# Patient Record
Sex: Male | Born: 1935 | Race: White | Hispanic: No | Marital: Married | Smoking: Former smoker
Health system: Southern US, Community
[De-identification: ages and names within clinical notes are randomized; demographics above are authoritative.]

## PROBLEM LIST (undated history)

## (undated) ENCOUNTER — Emergency Department (HOSPITAL_COMMUNITY): Payer: Medicare Other | Source: Home / Self Care

## (undated) DIAGNOSIS — H532 Diplopia: Secondary | ICD-10-CM

## (undated) DIAGNOSIS — J8410 Pulmonary fibrosis, unspecified: Secondary | ICD-10-CM

## (undated) DIAGNOSIS — I639 Cerebral infarction, unspecified: Secondary | ICD-10-CM

## (undated) DIAGNOSIS — C61 Malignant neoplasm of prostate: Secondary | ICD-10-CM

## (undated) DIAGNOSIS — E785 Hyperlipidemia, unspecified: Secondary | ICD-10-CM

## (undated) DIAGNOSIS — R4189 Other symptoms and signs involving cognitive functions and awareness: Secondary | ICD-10-CM

## (undated) DIAGNOSIS — I6529 Occlusion and stenosis of unspecified carotid artery: Secondary | ICD-10-CM

## (undated) DIAGNOSIS — I714 Abdominal aortic aneurysm, without rupture, unspecified: Secondary | ICD-10-CM

## (undated) DIAGNOSIS — T7840XA Allergy, unspecified, initial encounter: Secondary | ICD-10-CM

## (undated) DIAGNOSIS — M4310 Spondylolisthesis, site unspecified: Secondary | ICD-10-CM

## (undated) DIAGNOSIS — K219 Gastro-esophageal reflux disease without esophagitis: Secondary | ICD-10-CM

## (undated) DIAGNOSIS — G459 Transient cerebral ischemic attack, unspecified: Secondary | ICD-10-CM

## (undated) DIAGNOSIS — R413 Other amnesia: Secondary | ICD-10-CM

## (undated) HISTORY — DX: Malignant neoplasm of prostate: C61

## (undated) HISTORY — DX: Occlusion and stenosis of unspecified carotid artery: I65.29

## (undated) HISTORY — DX: Hyperlipidemia, unspecified: E78.5

## (undated) HISTORY — DX: Other amnesia: R41.3

## (undated) HISTORY — DX: Abdominal aortic aneurysm, without rupture, unspecified: I71.40

## (undated) HISTORY — DX: Allergy, unspecified, initial encounter: T78.40XA

## (undated) HISTORY — DX: Transient cerebral ischemic attack, unspecified: G45.9

## (undated) HISTORY — DX: Abdominal aortic aneurysm, without rupture: I71.4

## (undated) HISTORY — PX: FOOT SURGERY: SHX648

## (undated) HISTORY — PX: EYE SURGERY: SHX253

## (undated) HISTORY — DX: Diplopia: H53.2

## (undated) HISTORY — DX: Spondylolisthesis, site unspecified: M43.10

---

## 1998-04-02 ENCOUNTER — Ambulatory Visit (HOSPITAL_COMMUNITY): Admission: RE | Admit: 1998-04-02 | Discharge: 1998-04-02 | Payer: Self-pay | Admitting: Gastroenterology

## 1999-03-23 HISTORY — PX: OTHER SURGICAL HISTORY: SHX169

## 1999-04-27 ENCOUNTER — Other Ambulatory Visit: Admission: RE | Admit: 1999-04-27 | Discharge: 1999-04-27 | Payer: Self-pay | Admitting: Urology

## 1999-05-26 ENCOUNTER — Encounter: Admission: RE | Admit: 1999-05-26 | Discharge: 1999-08-24 | Payer: Self-pay | Admitting: Radiation Oncology

## 1999-08-07 ENCOUNTER — Ambulatory Visit (HOSPITAL_BASED_OUTPATIENT_CLINIC_OR_DEPARTMENT_OTHER): Admission: RE | Admit: 1999-08-07 | Discharge: 1999-08-07 | Payer: Self-pay | Admitting: *Deleted

## 1999-08-07 ENCOUNTER — Encounter: Payer: Self-pay | Admitting: Urology

## 1999-08-28 ENCOUNTER — Ambulatory Visit (HOSPITAL_COMMUNITY): Admission: RE | Admit: 1999-08-28 | Discharge: 1999-08-28 | Payer: Self-pay | Admitting: Radiation Oncology

## 1999-09-15 ENCOUNTER — Encounter: Admission: RE | Admit: 1999-09-15 | Discharge: 1999-12-14 | Payer: Self-pay | Admitting: Radiation Oncology

## 2003-03-12 ENCOUNTER — Observation Stay (HOSPITAL_COMMUNITY): Admission: EM | Admit: 2003-03-12 | Discharge: 2003-03-13 | Payer: Self-pay | Admitting: Emergency Medicine

## 2004-05-04 ENCOUNTER — Ambulatory Visit (HOSPITAL_COMMUNITY): Admission: RE | Admit: 2004-05-04 | Discharge: 2004-05-04 | Payer: Self-pay | Admitting: Gastroenterology

## 2006-11-21 HISTORY — PX: CAROTID ENDARTERECTOMY: SUR193

## 2006-12-06 ENCOUNTER — Ambulatory Visit: Payer: Self-pay | Admitting: Vascular Surgery

## 2006-12-09 ENCOUNTER — Encounter: Payer: Self-pay | Admitting: Vascular Surgery

## 2006-12-09 ENCOUNTER — Ambulatory Visit: Payer: Self-pay | Admitting: Vascular Surgery

## 2006-12-09 ENCOUNTER — Inpatient Hospital Stay (HOSPITAL_COMMUNITY): Admission: RE | Admit: 2006-12-09 | Discharge: 2006-12-10 | Payer: Self-pay | Admitting: Vascular Surgery

## 2007-01-03 ENCOUNTER — Ambulatory Visit: Payer: Self-pay | Admitting: Vascular Surgery

## 2007-06-28 ENCOUNTER — Encounter: Admission: RE | Admit: 2007-06-28 | Discharge: 2007-06-28 | Payer: Self-pay | Admitting: Family Medicine

## 2007-07-04 ENCOUNTER — Ambulatory Visit: Payer: Self-pay | Admitting: Vascular Surgery

## 2008-01-02 ENCOUNTER — Ambulatory Visit: Payer: Self-pay | Admitting: Vascular Surgery

## 2008-01-02 ENCOUNTER — Encounter: Admission: RE | Admit: 2008-01-02 | Discharge: 2008-01-02 | Payer: Self-pay | Admitting: Vascular Surgery

## 2008-08-06 ENCOUNTER — Ambulatory Visit: Payer: Self-pay | Admitting: Vascular Surgery

## 2008-08-15 ENCOUNTER — Encounter: Admission: RE | Admit: 2008-08-15 | Discharge: 2008-08-15 | Payer: Self-pay | Admitting: Family Medicine

## 2009-02-04 ENCOUNTER — Ambulatory Visit: Payer: Self-pay | Admitting: Vascular Surgery

## 2010-04-16 ENCOUNTER — Ambulatory Visit: Admit: 2010-04-16 | Payer: Self-pay | Admitting: Vascular Surgery

## 2010-04-16 ENCOUNTER — Ambulatory Visit
Admission: RE | Admit: 2010-04-16 | Discharge: 2010-04-16 | Payer: Self-pay | Source: Home / Self Care | Attending: Vascular Surgery | Admitting: Vascular Surgery

## 2010-04-27 NOTE — Procedures (Unsigned)
CAROTID DUPLEX EXAM  INDICATION:  Follow up right carotid stenosis, left CEA.  HISTORY: Diabetes:  No. Cardiac:  No. Hypertension:  No. Smoking:  Previous. Previous Surgery:  Left CEA in 11/2006 CV History: Amaurosis Fugax No, Paresthesias No, Hemiparesis No.                                      RIGHT             LEFT Brachial systolic pressure:         118               118 Brachial Doppler waveforms:         WNL               WNL Vertebral direction of flow:        Antegrade         Antegrade DUPLEX VELOCITIES (cm/sec) CCA peak systolic                   92                112 ECA peak systolic                   104               85 ICA peak systolic                   225               63 ICA end diastolic                   77                17 PLAQUE MORPHOLOGY:                  Heterogeneous     NA PLAQUE AMOUNT:                      Moderate          NA PLAQUE LOCATION:                    CCA/ICA/ECA       NA  IMPRESSION: 1. 60% to 79% right internal carotid artery stenosis. 2. Widely patent left carotid endarterectomy. 3. Antegrade bilateral vertebral arteries. 4. Evidence of disease progression in right internal carotid artery     from prior exam.       ___________________________________________ Quita Skye. Hart Rochester, M.D.  LT/MEDQ  D:  04/16/2010  T:  04/16/2010  Job:  [id number]

## 2010-04-27 NOTE — Procedures (Unsigned)
DUPLEX ULTRASOUND OF ABDOMINAL AORTA  INDICATION:  Followup abdominal aortic aneurysm.  HISTORY: Diabetes:  No Cardiac:  No Hypertension:  No Smoking:  Previous Connective Tissue Disorder: Family History:  No Previous Surgery:  No  DUPLEX EXAM:         AP (cm)                   TRANSVERSE (cm) Proximal             2.75 cm                   2.68 cm Mid                  2.21 cm                   2.73 cm Distal               3.44 cm                   3.47 cm Right Iliac          1.18 cm                   1.23 cm Left Iliac           1.29 cm                   1.26 cm  PREVIOUS:  Date: 02/04/2009  AP:  3.43  TRANSVERSE:  3.47  IMPRESSION:  Stable abdominal aortic aneurysm measuring 3.44 cm x 3.47 cm.  ___________________________________________ Quita Skye Hart Rochester, M.D.  LT/MEDQ  D:  04/16/2010  T:  04/16/2010  Job:  [id number]

## 2010-08-04 NOTE — Procedures (Signed)
CAROTID DUPLEX EXAM   INDICATION:  Follow up known carotid artery disease.   HISTORY:  Diabetes:  No  Cardiac:  Yes  Hypertension:  No  Smoking:  Quit in 1966  Previous Surgery:  No  CV History:  No  Amaurosis Fugax No, Paresthesias No, Hemiparesis No                                       RIGHT             LEFT  Brachial systolic pressure:         150               142  Brachial Doppler waveforms:         Biphasic          Biphasic  Vertebral direction of flow:        Antegrade         Antegrade  DUPLEX VELOCITIES (cm/sec)  CCA peak systolic                   98                82  ECA peak systolic                   79                76  ICA peak systolic                   158               336  ICA end diastolic                   53                141  PLAQUE MORPHOLOGY:                  Heterogenous      Heterogenous  PLAQUE AMOUNT:                      Mild              Severe  PLAQUE LOCATION:                    ICA, ECA          ICA, ECA   IMPRESSION:  Stenosis of 80-99% noted in the left internal carotid  artery.  Stenosis of 40-59% noted in the right internal carotid artery.  Antegrade bilateral vertebral arteries.   ___________________________________________  Quita Skye Hart Rochester, M.D.   MG/MEDQ  D:  12/06/2006  T:  12/07/2006  Job:  [id number]

## 2010-08-04 NOTE — Assessment & Plan Note (Signed)
OFFICE VISIT   [name] [name], [name] [name]  DOB:  05-04-1929                                       01/03/2007  ZOXWR#:[id number]   The patient returns having undergone left carotid endarterectomy by me  on September 19 for severe left internal carotid stenosis.  He suffered  what was felt to be a right posterior circulation TIA in [place] around  the first of September and had some clumsiness in his left upper  extremity as well as diplopia.  It was not clear exactly whether the  left internal carotid stenosis had any input in this symptomatology, but  because of the severe nature he underwent a left carotid endarterectomy  without difficulty.  He has done well since then with no neurologic  complications or specific complaints.  He is taking Aggrenox, which he  has been taking since September 1.  He has been swallowing well, has had  no hoarseness, denies any TIAs or amaurosis fugax.   PHYSICAL EXAM:  Blood pressure 127/75, heart rate 72, respirations 16.  Left neck incision has healed nicely.  Carotid pulses are 3+ with no  audible bruits.  Neurologic exam is normal.   He was reassured regarding these findings and will discontinue the  Aggrenox at the end of October and begin taking aspirin on a daily  basis.  He will return in 6 months for follow-up carotid duplex exam  unless he has any symptoms in the interim.   Quita Skye Hart Rochester, M.D.  Electronically Signed   JDL/MEDQ  D:  01/03/2007  T:  01/04/2007  Job:  [id number]   cc:   Donia Guiles, M.D.

## 2010-08-04 NOTE — Procedures (Signed)
DUPLEX ULTRASOUND OF ABDOMINAL AORTA   INDICATION:  Follow up of abdominal aortic aneurysm.   HISTORY:  Diabetes:  No.  Cardiac:  Yes.  Hypertension:  No.  Smoking:  Previous.  Connective Tissue Disorder:  Family History:  No abdominal aortic aneurysm.  Previous Surgery:  No.   DUPLEX EXAM:         AP (cm)                   TRANSVERSE (cm)  Proximal             2.50 cm                   2.43 cm  Mid                  2.23 cm                   2.02 cm  Distal               3.43 cm                   3.47 cm  Right Iliac          1.31 cm                   1.23 cm  Left Iliac           1.25 cm                   1.14 cm   PREVIOUS:  Date: January 02, 2008 (CT)  AP:  3.6  TRANSVERSE:   IMPRESSION:  Stable abdominal aortic aneurysm from previous CT with  largest measurement today of 3.43 cm x 3.47 cm.   ___________________________________________  Quita Skye Hart Rochester, M.D.   AS/MEDQ  D:  02/04/2009  T:  02/04/2009  Job:  [id number]

## 2010-08-04 NOTE — Procedures (Signed)
CAROTID DUPLEX EXAM   INDICATION:  Follow up known carotid artery disease.   HISTORY:  Diabetes:  No.  Cardiac:  Yes.  Hypertension:  No.  Smoking:  No.  Previous Surgery:  Left carotid endarterectomy in 12/09/2006.  CV History:  Amaurosis Fugax No, Paresthesias No, Hemiparesis No                                       RIGHT             LEFT  Brachial systolic pressure:         122               120  Brachial Doppler waveforms:         Biphasic          Biphasic  Vertebral direction of flow:        Antegrade         Antegrade  DUPLEX VELOCITIES (cm/sec)  CCA peak systolic                   192               137  ECA peak systolic                   134               122  ICA peak systolic                   228               84  ICA end diastolic                   86                33  PLAQUE MORPHOLOGY:                  Heterogeneous     None  PLAQUE AMOUNT:                      Moderate to severe                  None  PLAQUE LOCATION:                    CCA, ECA and ICA  None   IMPRESSION:  1. A 60-79% stenosis noted in the right ICA.  2. Normal carotid duplex noted in the left ICA.  Status post      endarterectomy.  Antegrade bilateral vertebral arteries.   ___________________________________________  Quita Skye Hart Rochester, M.D.   MG/MEDQ  D:  01/02/2008  T:  01/02/2008  Job:  [id number]

## 2010-08-04 NOTE — Discharge Summary (Signed)
NAMEHERMANN, [name] [name]              ACCOUNT NO.:  [id number]   MEDICAL RECORD NO.:  [id number]          PATIENT TYPE:  INP   LOCATION:  3305                         FACILITY:  [facility]   PHYSICIAN:  Quita Skye. Hart Rochester, M.D.  DATE OF BIRTH:  02/01/1930   DATE OF ADMISSION:  12/09/2006  DATE OF DISCHARGE:                               DISCHARGE SUMMARY   DISCHARGE DIAGNOSES:  1. Severe left internal carotid stenosis with recent posterior      circulation transient ischemic attack.  2. Hyperlipidemia.  3. History of prostate cancer treated with radiation seeds.   SURGICAL PROCEDURES PERFORMED DURING THIS HOSPITALIZATION:  December 09, 2006, a left carotid endarterectomy with Dacron patch angioplasty by  Dr. Hart Rochester.   HISTORY AND PRESENT ILLNESS:  A 77 year old male patient suffered what  was thought to be a posterior circulation TIA on September 3 while  visiting [place].  He developed some right posterior occipital pain and  some clumsiness in the left upper extremity and double vision, and had  no evidence of stroke on CT or MRI scan but was found to have some  carotid occlusive disease on an MRA and a carotid duplex exam.  He was  reexamined by Dr. Hart Rochester on September 16 and a carotid duplex revealed a  90% left internal carotid stenosis and he was scheduled for left carotid  endarterectomy.   PAST MEDICAL HISTORY:  1. Hyperlipidemia.  2. Posterior circulation TIAs as noted.  3. History of prostate cancer.   Negative for diabetes, hypertension, coronary artery disease, COPD or  stroke.   PREVIOUS SURGERY:  1. Achilles tendon repair.  2. Left eye cataract surgery.  3. Implantation of radium seeds.   LABORATORY DATA ON ADMISSION:  Included a white count of 5900,  hematocrit of 39%.  Normal clotting studies.  Sodium 139, potassium 4.8,  creatinine 0.9, BUN 13.  His chest x-ray revealed no masses with some  mild COPD.  Electrocardiogram was normal.   HOSPITAL COURSE:   Following admission on the morning of September 19,  the patient underwent an uneventful left carotid endarterectomy with  Dacron patch angioplasty by Dr. Hart Rochester, assisted by Shonna Chock, PA.  He awoke promptly in the recovery room with a normal neurologic exam,  blood pressure running 120/80, heart rate of 60.  He had an excellent  urinary output and was stable hemodynamically.  Postoperatively, he  swallowed well and developed no neurologic deficits and did not have  significant pain.  On the first postoperative day, IV fluids were  discontinued, as was his arterial line for monitoring and his vital  signs remained normal.  He ambulated well, had a good appetite, voided  well and was discharged with instructions to return to Dr. Hart Rochester in 2  weeks and will be notified by our office.  He was instructed to clean  the incision with soap and water and not to drive an automobile for 2  weeks, but to gradually resume his normal activities.   CONDITION AT THE TIME OF DISCHARGE:  Improved.   FINAL DIAGNOSES:  As above.  Quita Skye Hart Rochester, M.D.  Electronically Signed     JDL/MEDQ  D:  12/09/2006  T:  12/10/2006  Job:  [id number]

## 2010-08-04 NOTE — H&P (Signed)
HISTORY AND PHYSICAL EXAMINATION   December 06, 2006   Re:  [name] [name], [name] [name]               DOB:  1929/09/07   CHIEF COMPLAINT:  Severe left internal carotid stenosis with recent  posterior circulation TIA.   HISTORY OF PRESENT ILLNESS:  This 77 year old male patient suffered what  was thought to be a posterior circulation TIA on September 3 when  visiting [place].  He initially noted discomfort in his right posterior  occipital area and then noticed weakness and clumsiness in his left  upper extremity.  He also had diplopia.  He was taken to the emergency  department and within 1-2 hours, all of his symptoms had resolved.  He  had a CT scan and an MRI scan, both of which revealed no evidence of any  hemorrhage or infarct.  He had a carotid duplex exam and MRA, both of  which suggested a 70% left internal carotid stenosis and a mild 40-50%  right internal carotid stenosis.  He was treated with Aggrenox and  discharged to be followed by Dr. Arvilla Market in [place].  He was  referred to Dr. Hart Rochester for further evaluation, and a repeat carotid  duplex exam suggested that his left carotid stenosis was in the 90%  range in severity, and this was in agreement with Dr. Candie Chroman review of  the MRA study, which he had available.  It was not necessarily felt that  this was responsible for his recent event.  The patient decided to  proceed with an elective left carotid endarterectomy.   PAST MEDICAL HISTORY:  1. Hyperlipidemia.  2. Recent posterior circulation TIA.  3. History of prostate cancer, treated with radium seeds.  4. Negative for diabetes, hypertension, coronary artery disease, COPD,      or stroke.   PAST SURGICAL HISTORY:  1. Achilles tendon repair.  2. Left eye cataract surgery.  3. Implantation of radium seeds for prostate cancer.   FAMILY HISTORY:  Patient's brother had coronary artery disease, died at  age 22.  Mother and father had stroke, negative for  diabetes.   SOCIAL HISTORY:  He is married.  He has two children.  He is retired.  He has not smoked cigarettes since 1966.  He drinks occasional alcohol.   REVIEW OF SYSTEMS:  Denies any chest pain, dyspnea on exertion, PND,  orthopnea, anorexia, weight loss, hemoptysis, wheezing, hematemesis,  melena, lower extremity claudication or other specific symptoms.   ALLERGIES:  SUDAFED.   MEDICATIONS:  1. Simvastatin 40 mg once daily.  2. Aggrenox 2 times daily.  3. Claritin 10 mg daily.  4. Centrum Silver 1 daily.  5. V-D 1000 IU 1 daily.  6. Fish oil 1000 mg 3 times daily.   PHYSICAL EXAMINATION:  Blood pressure 148/80, heart rate 72,  respirations 16.  GENERAL:  He is a healthy-appearing male in no apparent distress.  Alert  and oriented x3.  NECK:  Supple.  Carotid pulses are 3+ and palpable.  There is a soft  bruit on the left side.  No bruit on the right.  NEUROLOGIC:  Normal.  No palpable adenopathy of the neck.  CHEST:  Clear to auscultation.  CARDIOVASCULAR:  Regular rhythm with no murmurs.  Upper extremity pulses are 3+ bilaterally.  ABDOMEN:  Soft and nontender with no palpable masses.  There are 3+ femoral, popliteal, and posterior tibial pulses  bilaterally.  There is no distal edema.   Carotid duplex exam  in the [facility] office revealed a 90% left internal  carotid stenosis and a proximal 50% right internal carotid stenosis.   IMPRESSION:  1. Recent posterior circulation transient ischemic attack.  2. Severe internal carotid stenosis, possibly asymptomatic.  3. Hyperlipidemia.  4. History of prostate cancer.   PLAN:  Admit patient on September 19th for an elective left carotid  endarterectomy.  Risks and benefits have been thoroughly discussed with  the patient, and he would like to proceed.   Quita Skye Hart Rochester, M.D.  Electronically Signed   JDL/MEDQ  D:  12/06/2006  T:  12/07/2006  Job:  [id number]

## 2010-08-04 NOTE — Procedures (Signed)
CAROTID DUPLEX EXAM   INDICATION:  Followup of known carotid artery disease.  The patient is  asymptomatic.   HISTORY:  Diabetes:  No.  Cardiac:  Yes.  Hypertension:  No.  Smoking:  No.  Previous Surgery:  Left CEA with DPA on 12/09/2006 by Dr. Hart Rochester.  CV History:  Amaurosis Fugax No, Paresthesias No, Hemiparesis No                                       RIGHT             LEFT  Brachial systolic pressure:         144               138  Brachial Doppler waveforms:         WNL               WNL  Vertebral direction of flow:        Antegrade         Antegrade  DUPLEX VELOCITIES (cm/sec)  CCA peak systolic                   104               94  ECA peak systolic                   96                71  ICA peak systolic                   164               62  ICA end diastolic                   59                19  PLAQUE MORPHOLOGY:                  Heterogenous      N/A  PLAQUE AMOUNT:                      Moderate          N/A  PLAQUE LOCATION:                    Bifurcation, ICA  N/A   IMPRESSION:  1. Right 40-59% ICA stenosis (upper end of range) with increased      velocity noted through to the mid ICA.  2. No recurrent stenosis status post left CEA.  3. Patent ECAs.  4. Bilateral antegrade flow in vertebral arteries.     ___________________________________________  Quita Skye Hart Rochester, M.D.   PB/MEDQ  D:  07/05/2007  T:  07/05/2007  Job:  [id number]

## 2010-08-04 NOTE — Assessment & Plan Note (Signed)
OFFICE VISIT   [name] [name], [name] [name]  DOB:  24-Feb-1930                                       08/06/2008  ZOXWR#:[id number]   The patient returns today for followup regarding his carotid occlusive  disease and his small abdominal aortic aneurysm.  He has had no new  neurologic symptoms since I last saw him in October including  hemiparesis, aphasia, amaurosis fugax, diplopia, blurred vision or  syncope.  He has had no abdominal pain and has no claudication symptoms  and able to walk at least a mile and a half without problems.  He also  has had no cardiac symptoms and continues to take one aspirin daily.   PHYSICAL EXAMINATION:  Vital signs:  Blood pressure 130/84, heart rate  57 irregular, respirations 14.  Neck:  Carotid pulses are 3+ with no  bruits.  Neurological:  Exam is normal.  No palpable adenopathy in the  neck.  Chest:  Clear to auscultation.  Cardiovascular:  Reveals an  irregular rhythm, no murmurs.  Abdomen:  Soft with small pulsatile mass  which is nontender approximating 4 cm.  He has 3+ femoral, popliteal and  posterior tibial pulses palpable bilaterally.   Carotid duplex today reveals a 60% right internal carotid stenosis and  no flow reduction on the left carotid where endarterectomy was  performed.   I plan to see him back in 6 months with a duplex scan of his aortic  aneurysm and a carotid duplex exam as well unless he develops any  symptoms in the interim.   Quita Skye Hart Rochester, M.D.  Electronically Signed   JDL/MEDQ  D:  08/06/2008  T:  08/07/2008  Job:  [id number]   cc:   Donia Guiles, M.D.

## 2010-08-04 NOTE — Procedures (Signed)
CAROTID DUPLEX EXAM   INDICATION:  Carotid disease.   HISTORY:  Diabetes:  No.  Cardiac:  Yes.  Hypertension:  No.  Smoking:  No.  Previous Surgery:  Left carotid endarterectomy on December 09, 2006.  CV History:  Asymptomatic.  Amaurosis Fugax No, Paresthesias No, Hemiparesis No.                                       RIGHT             LEFT  Brachial systolic pressure:         132               126  Brachial Doppler waveforms:         Normal            Normal  Vertebral direction of flow:        Antegrade         Antegrade  DUPLEX VELOCITIES (cm/sec)  CCA peak systolic                   86                90  ECA peak systolic                   87                80  ICA peak systolic                   182               55  ICA end diastolic                   65                20  PLAQUE MORPHOLOGY:                  Mixed  PLAQUE AMOUNT:                      Moderate          None  PLAQUE LOCATION:                    ICA/bifurcation   IMPRESSION:  1. Doppler velocity suggests at 40% to 59% stenosis of the right      internal carotid artery.  2. Patent left carotid endarterectomy site with no evidence of      stenosis in the left internal carotid artery.  3. The right internal carotid artery velocities appear less than      previously recorded when compared to the previous exam on January 02, 2008; however, are unchanged when compared to the previous exam      on 07/04/2007.  The left internal carotid artery remains stable.   ___________________________________________  Quita Skye. Hart Rochester, M.D.   CH/MEDQ  D:  08/06/2008  T:  08/06/2008  Job:  [id number]

## 2010-08-04 NOTE — Procedures (Signed)
CAROTID DUPLEX EXAM   INDICATION:  Follow up carotid artery disease.   HISTORY:  Diabetes:  No  Cardiac:  Yes  Hypertension:  No  Smoking:  Previous  Previous Surgery:  Left CEA 12/09/2006  CV History:  Asymptomatic  Amaurosis Fugax No, Paresthesias No, Hemiparesis No                                       RIGHT             LEFT  Brachial systolic pressure:         124               112  Brachial Doppler waveforms:         WNL               WNL  Vertebral direction of flow:        Antegrade         Antegrade  DUPLEX VELOCITIES (cm/sec)  CCA peak systolic                   110               144  ECA peak systolic                   97                99  ICA peak systolic                   188               84  ICA end diastolic                   71                15  PLAQUE MORPHOLOGY:                  Mixed  PLAQUE AMOUNT:                      Moderate          None  PLAQUE LOCATION:                    ICA/bifurcation   IMPRESSION:  1. Right internal carotid artery shows evidence of 40% to 59% stenosis      (top end of range).  2. Left internal carotid artery shows no evidence of restenosis status      post carotid endarterectomy.  3. No significant changes from previous study.   ___________________________________________  Quita Skye Hart Rochester, M.D.   AS/MEDQ  D:  02/04/2009  T:  02/04/2009  Job:  [id number]

## 2010-08-04 NOTE — Assessment & Plan Note (Signed)
OFFICE VISIT   [name] [name], [name] [name]  DOB:  [birth year]                                       07/04/2007  ZOXWR#:[id number]   Patient returns today for followup regarding his left carotid  endarterectomy, which I performed on 09/19, for severe left internal  carotid artery stenosis.  He previously had a posterior circulation TIA  which had consisted of left upper extremity weakness.  It was unclear  which carotid was responsible, although his right side had mild carotid  occlusive disease.  He has had no neurologic symptoms since that time.  He continues to take one aspirin per day and is currently not taking  Aggrenox, which he had been taking in the perioperative period.   Dr. Arvilla Market noted a pulsatile mass recently, and an ultrasound was  obtained which revealed a 4.4 x 4.3 cm maximum diameter, obtained in  April, 2009 at [facility]  This was not known about previously.   He denies any chest pain, dyspnea on exertion, or lower extremity  claudication symptoms.   PHYSICAL EXAMINATION:  Blood pressure 132/84, heart rate 67,  respirations 16.  Carotid pulses are 3+.  No bruits audible.  Neurologic  exam is normal.  Left neck incision is well healed.  Chest:  Clear to  auscultation.  Abdomen is soft and nontender with a small pulsatile mass  in the 4-5 cm range.  He has 3+ femoral, popliteal, and dorsalis pedis  pulses bilaterally.   Carotid duplex exam reveals the left internal carotid to be widely  patent and the right internal carotid to have a 40-50% stenosis.   We will follow his carotid endarterectomy site on the contralateral  right side in six months and also obtain a CT angiogram in six months to  see if he would be a candidate for stent grafting if the need arises.  I  discussed at length with him and his wife about aneurysms and available  treatment options.  Return in six months.   Quita Skye Hart Rochester, M.D.  Electronically Signed   JDL/MEDQ   D:  07/04/2007  T:  07/05/2007  Job:  [id number]   cc:   Donia Guiles, M.D.

## 2010-08-04 NOTE — Assessment & Plan Note (Signed)
OFFICE VISIT   [name] [name], [name] [name]  DOB:  [birth year]                                       01/02/2008  ZOXWR#:[id number]   The patient returns for further followup regarding his left carotid  endarterectomy which was performed in September 2008 following some  posterior circulation TIAs.  He has done well from that standpoint with  no recurrent neurologic symptoms and no active neurologic symptoms.  He  also was found have a small abdominal aortic aneurysm by Dr. Arvilla Market  measuring roughly 4.3 cm by ultrasound.  A CT angiogram was performed  today ordered by me which I have reviewed and reveals this aneurysm  appears to only be a maximum diameter of 3.6 cm.  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  It  does have a long neck and would be a candidate for stent grafting if it  ever enlarged to that degree.  He denies any chest pain or other  problems at this point.  He is fairly active without claudication  symptoms.  He is taking aspirin on a daily basis.   PHYSICAL EXAM:  Today blood pressure 125/77, heart rate 64, respirations  18.  Carotid pulses 3+.  Soft bruit on the right.  Neurologic:  Normal.  Left neck incision well-healed.  Chest:  Clear to auscultation.  Abdomen:  Soft, nontender with 4 cm pulsatile mass.  Has 3+ femoral,  dorsalis pedis pulses bilaterally.   Carotid duplex exam today reveals no flow reduction left internal  carotid.  The right internal carotid has a 70% stenosis which has  progressed and needs to be followed closely.  We will see him back in 6  months with followup carotid duplex exam and then check him 6 months  following that with a duplex scan of his aneurysm.   Quita Skye Hart [place], M.D.  Electronically Signed   JDL/MEDQ  D:  01/02/2008  T:  01/03/2008  Job:  [id number]

## 2010-08-04 NOTE — Assessment & Plan Note (Signed)
OFFICE VISIT   [name] [name], [name] [name]  DOB:  [birth year]                                       02/04/2009  ZOXWR#:[id number]   The patient returns today for further followup regarding a small  abdominal aortic aneurysm and his carotid occlusive disease.  He denies  any hemispheric or nonhemispheric TIAs, amaurosis fugax, diplopia,  blurred vision, syncope.  He denies abdominal or back pain.  He does  ambulate without claudication.  He had left carotid endarterectomy  performed by me in September of 2008 and has been stable from a  neurologic standpoint since that time.   STABLE CHRONIC PROBLEMS:  1. Include hyperlipidemia.  2. Posterior circulation TIAs.  3. History of prostate cancer.   FAMILY HISTORY:  Positive for cerebral hemorrhage in his father.   SOCIAL HISTORY:  He is married and has 2 children.  Has not used tobacco  since 1967.  Drinks occasional alcohol.   REVIEW OF SYSTEMS:  Has had some weight gain.  No chest pain, dyspnea on  exertion, PND, orthopnea.  No pulmonary problems.  Denies GI, GU,  vascular or other symptoms.   PHYSICAL EXAM:  Vital signs:  Blood pressure 128/78, heart rate 67,  respirations 14, temperature 97.4.  General:  He is alert and oriented  x3.  Neck:  Supple, 3+ carotid pulses.  There is soft bruit on the  right.  Neurological:  Normal.  No palpable adenopathy in the neck.  Chest:  Clear to auscultation.  Cardiovascular:  Regular rhythm.  No  murmurs.  Abdomen:  Soft, nontender with a 4 cm pulsatile mass.  He has  3+ femoral and posterior tibial pulses bilaterally.   I ordered a duplex scan of his aortic aneurysm and a carotid duplex scan  today.  The aneurysm is unchanged at 3.5 cm maximum diameter.  Carotid  disease is stable at 60% stenosis in the right ICA, widely patent left  ICA post endarterectomy.   He will return in 12 months for continued monitoring of his aneurysm and  carotid disease.  He is to be seen at that  time in the [place] clinic.  If he  develops any neurologic symptoms in the interim he will be in touch with  [place].   Quita Skye Hart [place], M.D.  Electronically Signed   JDL/MEDQ  D:  02/04/2009  T:  02/05/2009  Job:  [id number]   cc:   Donia Guiles, M.D.

## 2010-08-04 NOTE — Op Note (Signed)
[name], [name]              ACCOUNT NO.:  [id number]   MEDICAL RECORD NO.:  [id number]          PATIENT TYPE:  INP   LOCATION:  2550                         FACILITY:  [facility]   PHYSICIAN:  Quita Skye. Hart [place], M.D.  DATE OF BIRTH:  1929-11-30   DATE OF PROCEDURE:  12/09/2006  DATE OF DISCHARGE:                               OPERATIVE REPORT   PREOPERATIVE DIAGNOSIS:  Severe left internal carotid stenosis with  recent posterior circulation transient ischemic attack.   POSTOPERATIVE DIAGNOSIS:  Severe left internal carotid stenosis with  recent posterior circulation transient ischemic attack.   OPERATION:  Left carotid endarterectomy with Dacron patch angioplasty.   SURGEON:  Quita Skye. Hart [place], M.D.   FIRST ASSISTANT:  Jerold Coombe, P.A.   ANESTHESIA:  General endotracheal.   BRIEF HISTORY:  This patient suffered a TIA a few weeks ago, which  consisted of some clumsiness in the left upper extremity, some pain in  the right occipital area, and it was felt that this represented a  posterior circulation TIA by his neurologist in [place] at the time of  his evaluation.  He had carotid duplex exam, which revealed high-grade  left internal carotid stenosis and a very mild right internal carotid  stenosis as well as an MRA exam which confirmed the left carotid  stenosis, and hit was recommended that he undergo left carotid  endarterectomy, although it was unclear whether this was responsible for  his symptomatology.   PROCEDURE:  The patient was taken to the operating room, placed in  supine position, at which time satisfactory general endotracheal  anesthesia was administered.  The left neck was prepped with Betadine  scrub and solution and draped in a routine sterile manner.  Incision was  made along the anterior border of the sternocleidomastoid muscle and  carried down through subcutaneous tissue and platysma using the Bovie.  The common facial vein and external jugular  veins were ligated with 3-0  silk ties and divided exposing the common, internal and external carotid  arteries.  Care was taken not to injure the vagus or hypoglossal nerves,  both of which were exposed.  There was a calcified atherosclerotic  plaque at the carotid bifurcation extending up the internal carotid  artery about 3 cm.  The distal vessel appeared normal.  A #10 shunt was  prepared and the patient was heparinized.  The carotid vessels were  occluded with vascular clamps, a longitudinal opening made in the common  carotid with a 15 blade, extended up the internal carotid with Potts  scissors to a point distal to the disease.  A #10 shunt was inserted  without difficulty, reestablishing flow in about 2 minutes.  A standard  endarterectomy was then performed using the elevator and the Potts  scissors with an eversion endarterectomy of the external carotid.  The  plaque feathered off the distal internal carotid artery nicely, not  requiring any tacking sutures.  The lumen was thoroughly irrigated with  heparin and saline and all loose debris carefully removed, and the  arteriotomy was closed with a patch using continuous 6-0 Prolene.  Prior  to completion of the closure, the shunt was removed after about 30  minutes of shunt time.  Following antegrade and retrograde flushing, the  closure was completed with reestablishment  of flow initially up the  external and then up the internal branch.  The carotid was occluded for  less than 2 minutes for removal of the shunt.  Protamine was then given to reverse the heparin.  Following adequate  hemostasis, the wound was irrigated with saline and closed in layers  with Vicryl in a subcuticular fashion.  A sterile dressing applied.  The  patient was taken to the recovery room in satisfactory condition.      Quita Skye Hart [place], M.D.  Electronically Signed     JDL/MEDQ  D:  12/09/2006  T:  12/09/2006  Job:  [id number]

## 2010-08-07 NOTE — Op Note (Signed)
NAME:  [name] [name], [name] [name]                     ACCOUNT NO.:  [id number]   MEDICAL RECORD NO.:  [id number]          PATIENT TYPE:  AMB   LOCATION:  ENDO                         FACILITY:  [facility]   PHYSICIAN:  James L. Malon Kindle., M.D.DATE OF BIRTH:  28-Jun-1929   DATE OF PROCEDURE:  05/04/2004  DATE OF DISCHARGE:                                 OPERATIVE REPORT   PROCEDURE:  Colonoscopy.   MEDICATIONS:  Fentanyl 75 mcg, Versed 7 mg IV.   SCOPE:  Olympus adjustable colonoscope.   INDICATIONS:  Colon cancer screening.   DESCRIPTION OF PROCEDURE:  The procedure has been explained to the patient  and consent obtained.  With the patient in the left lateral decubitus  position, the Olympus pediatric adjustable scope was inserted and advanced.  The prep was excellent.  Using abdominal pressure and position changes, we  were able to reach the cecum.  The ileocecal valve and appendiceal orifice  were seen.  The scope was withdrawn.  The terminal ileum was entered for a  short distance, which was normal.  The cecum, ascending colon, transverse  colon, descending and sigmoid colon were seen well.  No polyps were seen.  No significant diverticular disease.  The scope was withdrawn.  The patient  tolerated the procedure well.   ASSESSMENT:  Normal screening colonoscopy.  V76.51.   PLAN:  Will recommend yearly Hemoccults and a repeat procedure in 10 years  or for specific indications.      JLE/MEDQ  D:  05/04/2004  T:  05/04/2004  Job:  [id number]   cc:   Donia Guiles, M.D.  [address] [place]  [place] [address]  Fax: [phone]

## 2010-08-07 NOTE — Discharge Summary (Signed)
NAME:  [name] [name], [name] [name] NO.:  [id number]   MEDICAL RECORD NO.:  [id number]                   PATIENT TYPE:  INP   LOCATION:  2021                                 FACILITY:  [facility]   PHYSICIAN:  Georgann Housekeeper, M.D.                 DATE OF BIRTH:  [birth year]   DATE OF ADMISSION:  03/11/2003  DATE OF DISCHARGE:  03/13/2003                                 DISCHARGE SUMMARY   CONDITION ON DISCHARGE:  Stable.   DISCHARGE MEDICATIONS:  1. Aspirin one a day.  2. Multivitamin.  3. Claritin p.r.n.   No Sudafed or any pseudoephedrine products.   DISCHARGE DIAGNOSES:  Presyncope.   LABORATORY DATA:  Carotid ultrasounds negative for stenosis.  Telemetry  monitoring for 24 hours was normal sinus rhythm without any arrhythmias.  Laboratory data was normal.  Chemistries, blood counts, thyroid, and cardiac  markers.  Echocardiogram obtained with results pending.  Please see H&P for  details.   HOSPITAL COURSE:  A 77 year old with past medical history of prostate cancer  with seed implantation and some seasonal rhinitis admitted with presyncopal  symptoms without any other prodromal symptoms.  PRESYNCOPE:  Admitted to telemetry.  On the monitor for the period.  Blood  work was unremarkable as mentioned above.  His EKG was normal.  He had been  taking Sudafed for about a week.  The patient had no symptoms in the  hospital.  No orthostasis or any arrhythmias.  Echocardiogram was obtained  which results are pending.  He is to follow up as an outpatient.  Ultrasound  and carotids are negative for stenosis.  The etiology is thought to be  secondary to probably Sudafed versus vaso-vagal stress.  The patient will  continue on aspirin and Claritin.  If it happens again, he will need to  follow up with neurology.  At this point, follow up with his PCP.                                                Georgann Housekeeper, M.D.    Arliss Journey  D:  03/13/2003  T:  03/14/2003  Job:   [id number]   cc:   Donia Guiles, M.D.  [address] [place]  [place] [address]  Fax: [phone]

## 2010-08-07 NOTE — Op Note (Signed)
[place]  Patient:    [name] [name], [name] [name]                      MRN: [id number] Proc. Date: 08/07/99 Adm. Date:  [id number] Disc. Date: [id number] Attending:  Nelma Rothman Iii                           Operative Report  DIAGNOSIS:  Adenocarcinoma of the prostate.  PROCEDURE:  Transperineal implantation of iodine 125 seeds to the prostate and flexible cystourethroscopy.  SURGEON:  Gaynelle Arabian, M.D.  ASSISTANT:  Kathrin Greathouse, M.D.  ANESTHESIA:  General endotracheal.  ESTIMATED BLOOD LOSS:  15 cc.  TUBE:  A 16 French Foley.  COMPLICATIONS:  None.  A total of 80 seeds were implanted with 20 needles at 0.352 mc/seed, Vicryl strands were utilized.  INDICATIONS FOR PROCEDURE:  Mr. Andal is a very nice 77 year old white male who presented with an elevated PSA of 6.7 and a ridge on the left side of the prostate.  He subsequently underwent transrectal ultrasound and biopsy of the prostate which revealed a Gleason score 6, which was 3+3 adenocarcinoma from both sides of his prostate.  He has considered all options carefully after understanding risks, benefits, and alternatives, and elected to proceed with seed implantation.  He has been properly simulated and properly informed.  PROCEDURE IN DETAIL:  The patient was placed in the supine position and proper general endotracheal anesthesia in the dorsolithotomy position and prepped and draped with Betadine in a sterile fashion.  A #16 French Foley catheter was inserted and inflated with 10 cc of contrast solution.  A #16 French red rubber catheter was placed into the rectum to vent flatus and the Siemens transrectal ultrasound probe was placed on the Aristes stabilization device and localized to preplanned coordinates.  A reference plane of 2 cm from the base was utilized for implantation.  Both electronic and physical rib were placed along with fluoroscopy and the perineum was prepped  sterilely.  Two holding needles were placed in unused coordinates and serial implantation of the prostate was performed and preplanned coordinates.  A total 80 seed were implanted with 20 needle at 0.352 mc/seed.  Localization of the needles and seeds was facilitated using both ultrasound guidance and fluoroscopy. Following implantation, we were comfortable with the distribution of seeds within in the prostate, both fluoroscopically and by ultrasound.  Following implantation static images were obtained fluoroscopically.  The transrectal ultrasound probe and the red rubber tube were removed.  The wound was dressed sterilely and the patient was placed in the supine position.  The Foley catheter was then removed and scanned for seeds and there were none within it and flexible cystourethroscopy was performed with an Olympus flexible cystourethroscope.  He is noted to have mild trilobar hypertrophy, grade 1 trabeculation was noted.  Efflux of clear urine was noted from the normally placed ureteral orifices bilaterally and there were no seeds or Vicryl strands in the bladder or the urethra.  The flexible cystourethroscope was removed and a new #16 [place] Foley catheter was placed.  The urine was strained clear and the patient was taken to the recovery room stable. DD:  08/07/99 TD:  08/11/99 Job: [id number]

## 2010-08-07 NOTE — H&P (Signed)
NAME:  [name] [name], [name] [name]                         ACCOUNT NO.:  [id number]   MEDICAL RECORD NO.:  [id number]                   PATIENT TYPE:  INP   LOCATION:  6524                                 FACILITY:  [facility]   PHYSICIAN:  Corinna L. Lendell Caprice, MD             DATE OF BIRTH:  [birth year]   DATE OF ADMISSION:  03/11/2003  DATE OF DISCHARGE:                                HISTORY & PHYSICAL   CHIEF COMPLAINT:  Passed out.   HISTORY OF PRESENT ILLNESS:  [name] [name] is a pleasant 77 year old white male  who had three syncopal episodes today.  The first one was more like  presyncope, but he had two witnessed episodes where he slumped over.  These  lasted for about 25 seconds each.  He had no prodromal symptoms.  He had no  chest pain, shortness of breath or palpitations.  He feels back to normal  now.  He had no postictal period, no jerking motions and no urinary  incontinence.  He has never had this problem before.  He started Sudafed a  week ago.   PAST MEDICAL HISTORY:  1. Prostate cancer with seed implantation.  2. Seasonal allergic rhinitis.   MEDICATIONS:  Multivitamin, Claritin and Sudafed.   ALLERGIES:  No known drug allergies.   SOCIAL HISTORY:  The patient drinks alcohol occasionally.  He does not  smoke.  He is married.   FAMILY HISTORY:  Mother died of a stroke at age 76.  Brother died of an  acute MI at age 71.  Father had a cerebral hemorrhage.   REVIEW OF SYSTEMS:  As above, otherwise systemic review is negative.   PHYSICAL EXAMINATION:  VITAL SIGNS:  On physical examination his temperature  is 97.6, respiratory rate 28, blood pressure 145/84, and pulse 72.  GENERAL APPEARANCE:  In general the patient is well-nourished, well-  developed and in no acute distress.  HEENT:  Normocephalic and atraumatic.  Pupils equal, round and react to  light.  Sclerae nonicteric.  Moist mucous membranes.  NECK:  Neck is supple.  No lymphadenopathy.  No thyromegaly.  No carotid  bruits.  LUNGS:  Lungs are clear to auscultation bilaterally without wheezes, rhonchi  or rales.  CARDIOVASCULAR:  Regular rate and rhythm without murmurs, gallops or rubs.  ABDOMEN:  Normal bowel sounds.  Soft and nontender.  Nondistended.  GENITALIA AND RECTAL:  GU and rectal are deferred.  EXTREMITIES:  No clubbing, cyanosis or edema.  SKIN:  No rash.  PSYCHIATRIC:  Normal affect.  NEUROLOGIC:  Alert and oriented.  Cranial nerves, and sensory and motor  exams are intact.   LABORATORY DATA:  Two sets of point of care enzymes normal.  EKG shows  normal sinus rhythm.   ASSESSMENT AND PLAN:  1. Syncope.  This is suspicious for arrhythmia and may be related to Sudafed.   The patient will be admitted to telemetry.  His Sudafed will  be stopped.  I  will check a TSH, check an echocardiogram and carotid Dopplers.  If these  are all normal, consider Holter monitor;   1. History of prostate cancer.                                                Corinna L. Lendell Caprice, MD    CLS/MEDQ  D:  03/11/2003  T:  03/12/2003  Job:  [id number]   cc:   Donia Guiles, M.D.  [address] [place]  [place] [address]  Fax: [phone]

## 2010-10-13 ENCOUNTER — Other Ambulatory Visit: Payer: Self-pay

## 2010-10-13 ENCOUNTER — Ambulatory Visit: Payer: Self-pay | Admitting: Vascular Surgery

## 2010-10-26 ENCOUNTER — Encounter: Payer: Self-pay | Admitting: Vascular Surgery

## 2010-11-10 ENCOUNTER — Encounter: Payer: Self-pay | Admitting: Vascular Surgery

## 2010-11-10 ENCOUNTER — Ambulatory Visit (INDEPENDENT_AMBULATORY_CARE_PROVIDER_SITE_OTHER): Payer: Medicare Other | Admitting: Vascular Surgery

## 2010-11-10 ENCOUNTER — Other Ambulatory Visit (INDEPENDENT_AMBULATORY_CARE_PROVIDER_SITE_OTHER): Payer: Medicare Other

## 2010-11-10 VITALS — BP 142/79 | HR 61 | Ht 70.0 in | Wt 185.0 lb

## 2010-11-10 DIAGNOSIS — I6529 Occlusion and stenosis of unspecified carotid artery: Secondary | ICD-10-CM

## 2010-11-10 DIAGNOSIS — Z48812 Encounter for surgical aftercare following surgery on the circulatory system: Secondary | ICD-10-CM

## 2010-11-10 DIAGNOSIS — I714 Abdominal aortic aneurysm, without rupture: Secondary | ICD-10-CM

## 2010-11-10 NOTE — Progress Notes (Signed)
Subjective:     Patient ID: [name] [name], male   DOB: 05-07-1929, 77 y.o.   MRN: [id number]  HPI this 77 year old male patient returns for followup regarding his carotid occlusive disease and small aortic aneurysm. He has had a previous left carotid endarterectomy by me in 2008 following posterior circulation TIAs. These resolved and he has had no recurrent neurologic symptoms. We have been following a moderate right internal carotid stenosis. He remains asymptomatic. He denies amaurosis fugax, hemiparesis, diplopia, blurred vision, and syncope. He has had no abnormal or back pain.  Past Medical History  Diagnosis Date  . Allergy   . Hyperlipidemia   . Prostate cancer   . TIA (transient ischemic attack)   . Diplopia   . Carotid artery stenosis     History  Substance Use Topics  . Smoking status: Former Smoker    Types: Cigarettes    Quit date: 03/27/1964  . Smokeless tobacco: Not on file  . Alcohol Use: Yes     occasional    Family History  Problem Relation Age of Onset  . Stroke Mother   . Stroke Father   . Heart disease Brother     Allergies  Allergen Reactions  . Sudafed (Pseudoephedrine Hcl)     Causes dizziness and nervousness    Current outpatient prescriptions:aspirin 81 MG tablet, Take 81 mg by mouth daily.  , Disp: , Rfl: ;  Cholecalciferol (VITAMIN D) 1000 UNITS capsule, Take 1,000 Units by mouth daily.  , Disp: , Rfl: ;  loratadine (CLARITIN) 10 MG tablet, Take 10 mg by mouth as needed.  , Disp: , Rfl: ;  Multiple Vitamins-Minerals (CENTRUM SILVER PO), Take by mouth daily.  , Disp: , Rfl:  Omega-3 Fatty Acids (FISH OIL) 1000 MG CPDR, Take by mouth 3 (three) times daily.  , Disp: , Rfl: ;  simvastatin (ZOCOR) 40 MG tablet, Take 40 mg by mouth daily.  , Disp: , Rfl:   Filed Vitals:   11/10/10 1551  Height: 5\' 10"  (1.778 m)  Weight: 185 lb (83.915 kg)    Body mass index is 26.54 kg/(m^2).         Review of Systems he denies chest pain dyspnea on  exertion PND orthopnea and lower extremity claudication symptoms a complete review of systems is negative.     Objective:   Physical Exam blood pressure 140/79 heart rate 61 respirations 14 General he is alert and oriented x3 in no apparent distress Chest clear to auscultation no rhonchi or wheezing Cardiovascular reveals regular rhythm and no murmurs Abdomen soft nontender with a very small pulsatile mass in the 3-4 cm range. He has 3+ femoral popliteal and posterior tibial pulses palpable bilaterally. Carotid pulses are 3+ with a soft bruit on the right no bruit on the left Neurologic exam is normal    Assessment:     Today I ordered a carotid duplex exam which are reviewed and interpreted. His right internal carotid stenosis approximately 70% His left internal carotid is widely patent post carotid endarterectomy in 08    Plan:     Return in 6 months for carotid duplex exam to monitor progress of right internal carotid stenosis which is asymptomatic and a followup abdominal aortic aneurysm which is small

## 2010-11-11 NOTE — Progress Notes (Signed)
Addended by: Oletha Blend on: 11/11/2010 11:54 AM   Modules accepted: Orders

## 2010-11-17 NOTE — Procedures (Unsigned)
CAROTID DUPLEX EXAM  INDICATION:  Followup left CEA, right carotid disease.  HISTORY: Diabetes:  No. Cardiac:  No. Hypertension:  No. Smoking:  Previous. Previous Surgery:  Left CEA performed 11/2006. CV History: Amaurosis Fugax No, Paresthesias No, Hemiparesis No                                      RIGHT             LEFT Brachial systolic pressure:         138               138 Brachial Doppler waveforms:         WNL               WNL Vertebral direction of flow:        Antegrade         Antegrade DUPLEX VELOCITIES (cm/sec) CCA peak systolic                   87                112 ECA peak systolic                   69                72 ICA peak systolic                   247               66 ICA end diastolic                   94                20 PLAQUE MORPHOLOGY:                  Heterogeneous PLAQUE AMOUNT:                      Moderate to severe                  NA PLAQUE LOCATION:                    ICA  IMPRESSION: 1. 60%-79% right internal carotid artery stenosis. 2. Widely patent left carotid endarterectomy without evidence of     restenosis or hyperplasia. 3. Bilateral vertebral arteries are within normal limits.  ___________________________________________ Quita Skye Hart [place], M.D.  LT/MEDQ  D:  11/10/2010  T:  11/10/2010  Job:  [id number]

## 2010-12-31 LAB — CBC
Hemoglobin: 11.2 — ABNORMAL LOW
MCHC: 35
MCV: 92.7
RBC: 3.52 — ABNORMAL LOW
RBC: 4.2 — ABNORMAL LOW

## 2010-12-31 LAB — BASIC METABOLIC PANEL
BUN: 9
CO2: 28
Calcium: 8.8
Chloride: 102
GFR calc Af Amer: >60
Glucose, Bld: 96
Potassium: 3.8

## 2010-12-31 LAB — COMPREHENSIVE METABOLIC PANEL
ALT: 23
AST: 27
Albumin: 4.3
Alkaline Phosphatase: 45
BUN: 13
Calcium: 9.5
Chloride: 104
GFR calc Af Amer: >60
Glucose, Bld: 97

## 2010-12-31 LAB — URINALYSIS, ROUTINE W REFLEX MICROSCOPIC
Ketones, ur: NEGATIVE
Protein, ur: NEGATIVE
Specific Gravity, Urine: 1.011

## 2010-12-31 LAB — CROSSMATCH

## 2010-12-31 LAB — ABO/RH: ABO/RH(D): O POS

## 2010-12-31 LAB — APTT: aPTT: 30

## 2011-03-30 ENCOUNTER — Other Ambulatory Visit: Payer: Medicare Other

## 2011-04-19 ENCOUNTER — Other Ambulatory Visit: Payer: Medicare Other

## 2011-04-19 ENCOUNTER — Encounter (INDEPENDENT_AMBULATORY_CARE_PROVIDER_SITE_OTHER): Payer: Medicare Other | Admitting: *Deleted

## 2011-04-19 ENCOUNTER — Other Ambulatory Visit (INDEPENDENT_AMBULATORY_CARE_PROVIDER_SITE_OTHER): Payer: Medicare Other | Admitting: *Deleted

## 2011-04-19 DIAGNOSIS — Z48812 Encounter for surgical aftercare following surgery on the circulatory system: Secondary | ICD-10-CM

## 2011-04-19 DIAGNOSIS — I6529 Occlusion and stenosis of unspecified carotid artery: Secondary | ICD-10-CM

## 2011-04-19 DIAGNOSIS — I714 Abdominal aortic aneurysm, without rupture: Secondary | ICD-10-CM

## 2011-04-28 ENCOUNTER — Other Ambulatory Visit: Payer: Self-pay | Admitting: *Deleted

## 2011-04-28 DIAGNOSIS — I6529 Occlusion and stenosis of unspecified carotid artery: Secondary | ICD-10-CM

## 2011-04-28 DIAGNOSIS — Z48812 Encounter for surgical aftercare following surgery on the circulatory system: Secondary | ICD-10-CM

## 2011-04-28 DIAGNOSIS — I714 Abdominal aortic aneurysm, without rupture: Secondary | ICD-10-CM

## 2011-04-29 ENCOUNTER — Encounter: Payer: Self-pay | Admitting: Vascular Surgery

## 2011-04-29 NOTE — Procedures (Unsigned)
CAROTID DUPLEX EXAM  INDICATION:  Stenosis followup.  HISTORY: Diabetes:  No Cardiac:  No Hypertension:  No Smoking:  Previous Previous Surgery:  Left carotid endarterectomy 11/2006 CV History:  Currently asymptomatic Amaurosis Fugax No, Paresthesias No, Hemiparesis No                                      RIGHT             LEFT Brachial systolic pressure:         134               136 Brachial Doppler waveforms:         Normal            Normal Vertebral direction of flow:        Antegrade         Antegrade DUPLEX VELOCITIES (cm/sec) CCA peak systolic                   77                123 ECA peak systolic                   71                87 ICA peak systolic                   225               73 ICA end diastolic                   86                32 PLAQUE MORPHOLOGY:                  Heterogeneous PLAQUE AMOUNT:                      Moderate / severe None PLAQUE LOCATION:                    ICA, distal CCA  IMPRESSION: 1. Right internal carotid artery velocity suggests 60%-79% stenosis. 2. Patent left carotid endarterectomy site without evidence of     restenosis of the internal carotid artery. 3. Stable in comparison to the previous exam.  ___________________________________________ Quita Skye. Hart [place], M.D.  EM/MEDQ  D:  04/19/2011  T:  04/19/2011  Job:  [id number]

## 2011-04-29 NOTE — Procedures (Unsigned)
DUPLEX ULTRASOUND OF ABDOMINAL AORTA  INDICATION:  AAA  HISTORY: Diabetes:  No Cardiac:  No Hypertension:  No Smoking:  Previous Connective Tissue Disorder: Family History:  No Previous Surgery:  No  DUPLEX EXAM:         AP (cm)                   TRANSVERSE (cm) Proximal             2.68 cm                   2.75 cm Mid                  2.69 cm                   2.75 cm Distal               3.61 cm                   3.61 cm Right Iliac          1.16 cm                   1.16 cm Left Iliac           1.29 cm                   1.22 cm  PREVIOUS:  Date:  04/16/2010  AP:  3.44  TRANSVERSE:  3.47  IMPRESSION:  Stable abdominal aortic aneurysm measuring 3.61 x 3.61 cm.  ___________________________________________ [name] [name]. Hart Rochester, M.D.  EM/MEDQ  D:  04/19/2011  T:  04/19/2011  Job:  [id number]

## 2011-10-11 ENCOUNTER — Encounter: Payer: Self-pay | Admitting: Neurosurgery

## 2011-10-12 ENCOUNTER — Other Ambulatory Visit (INDEPENDENT_AMBULATORY_CARE_PROVIDER_SITE_OTHER): Payer: Medicare Other | Admitting: *Deleted

## 2011-10-12 ENCOUNTER — Encounter: Payer: Self-pay | Admitting: Neurosurgery

## 2011-10-12 ENCOUNTER — Ambulatory Visit (INDEPENDENT_AMBULATORY_CARE_PROVIDER_SITE_OTHER): Payer: Medicare Other | Admitting: Neurosurgery

## 2011-10-12 VITALS — BP 124/79 | HR 70 | Resp 16 | Ht 71.0 in | Wt 185.4 lb

## 2011-10-12 DIAGNOSIS — I6529 Occlusion and stenosis of unspecified carotid artery: Secondary | ICD-10-CM

## 2011-10-12 DIAGNOSIS — Z48812 Encounter for surgical aftercare following surgery on the circulatory system: Secondary | ICD-10-CM

## 2011-10-12 NOTE — Progress Notes (Signed)
[facility] [name] Note  CC: Six-month carotid duplex for surveillance Referring Physician: Hart Rochester  History of Present Illness: 78 year old male patient of Dr. Hart Rochester who status post left CEA in September 2008. Patient denies any signs or symptoms of CVA, TIA, amaurosis fugax or any neural deficit. The patient denies any new medical diagnoses or recent surgeries.  Past Medical History  Diagnosis Date  . Allergy   . Hyperlipidemia   . Prostate cancer   . TIA (transient ischemic attack)   . Diplopia   . Carotid artery stenosis     ROS: [x]  Positive   [ ]  Denies    General: [ ]  Weight loss, [ ]  Fever, [ ]  chills Neurologic: [ ]  Dizziness, [ ]  Blackouts, [ ]  Seizure [ ]  Stroke, [ ]  "Mini stroke", [ ]  Slurred speech, [ ]  Temporary blindness; [ ]  weakness in arms or legs, [ ]  Hoarseness Cardiac: [ ]  Chest pain/pressure, [ ]  Shortness of breath at rest [ ]  Shortness of breath with exertion, [ ]  Atrial fibrillation or irregular heartbeat Vascular: [ ]  Pain in legs with walking, [ ]  Pain in legs at rest, [ ]  Pain in legs at night,  [ ]  Non-healing ulcer, [ ]  Blood clot in vein/DVT,   Pulmonary: [ ]  Home oxygen, [ ]  Productive cough, [ ]  Coughing up blood, [ ]  Asthma,  [ ]  Wheezing Musculoskeletal:  [ ]  Arthritis, [ ]  Low back pain, [ ]  Joint pain Hematologic: [ ]  Easy Bruising, [ ]  Anemia; [ ]  Hepatitis Gastrointestinal: [ ]  Blood in stool, [ ]  Gastroesophageal Reflux/heartburn, [ ]  Trouble swallowing Urinary: [ ]  chronic Kidney disease, [ ]  on HD - [ ]  MWF or [ ]  TTHS, [ ]  Burning with urination, [ ]  Difficulty urinating Skin: [ ]  Rashes, [ ]  Wounds Psychological: [ ]  Anxiety, [ ]  Depression   Social History History  Substance Use Topics  . Smoking status: Former Smoker    Types: Cigarettes    Quit date: 03/27/1964  . Smokeless tobacco: Not on file  . Alcohol Use: Yes     occasional    Family History Family History  Problem Relation Age  of Onset  . Stroke Mother   . Heart disease Mother   . Stroke Father   . Heart attack Father   . Heart disease Brother     Allergies  Allergen Reactions  . Sudafed (Pseudoephedrine Hcl)     Causes dizziness and nervousness    Current Outpatient Prescriptions  Medication Sig Dispense Refill  . aspirin 81 MG tablet Take 81 mg by mouth daily.        Marland Kitchen loratadine (CLARITIN) 10 MG tablet Take 10 mg by mouth as needed.        . Multiple Vitamins-Minerals (CENTRUM SILVER PO) Take by mouth daily.        . Omega-3 Fatty Acids (FISH OIL) 1000 MG CPDR Take by mouth 3 (three) times daily.        . rosuvastatin (CRESTOR) 40 MG tablet Take 40 mg by mouth daily.      . Cholecalciferol (VITAMIN D) 1000 UNITS capsule Take 1,000 Units by mouth daily.        . simvastatin (ZOCOR) 40 MG tablet Take 40 mg by mouth daily.          Physical Examination  Filed Vitals:   10/12/11 1602  BP: 124/79  Pulse: 70  Resp:     Body mass index is  25.86 kg/(m^2).  General:  WDWN in NAD Gait: Normal HEENT: WNL Eyes: Pupils equal Pulmonary: normal non-labored breathing , without Rales, rhonchi,  wheezing Cardiac: RRR, without  Murmurs, rubs or gallops; Abdomen: soft, NT, no masses Skin: no rashes, ulcers noted  Vascular Exam Pulses: 3+ radial pulses bilaterally Carotid bruits: Carotid pulses to auscultation no bruits are heard Extremities without ischemic changes, no Gangrene , no cellulitis; no open wounds;  Musculoskeletal: no muscle wasting or atrophy   Neurologic: A&O X 3; Appropriate Affect ; SENSATION: normal; MOTOR FUNCTION:  moving all extremities equally. Speech is fluent/normal  Non-Invasive Vascular Imaging CAROTID DUPLEX 10/12/2011  Right ICA 60 - 79 % stenosis Left ICA 0 - 19% stenosis   ASSESSMENT/PLAN: Asymptomatic patient status post left CEA and doing well, high-grade right carotid stenosis. We will follow the patient on six-month surveillance rotation. Patient's questions were  encouraged and answered he is in agreement with this plan. The patient knows the signs and symptoms of CVA and knows to report to the nearest emergency department should that occur.  Lauree Chandler ANP   Clinic MD: Hart Rochester

## 2011-10-13 NOTE — Addendum Note (Signed)
Addended by: Sharee Pimple on: 10/13/2011 07:59 AM   Modules accepted: Orders

## 2011-10-18 NOTE — Procedures (Unsigned)
CAROTID DUPLEX EXAM  INDICATION:  Carotid endarterectomy.  HISTORY: Diabetes:  No Cardiac:  No Hypertension:  No Smoking:  Previous. Previous Surgery:  Left carotid endarterectomy in September, 2008. CV History:  Currently asymptomatic. Amaurosis Fugax No, Paresthesias No, Hemiparesis No                                      RIGHT             LEFT Brachial systolic pressure:         116               116 Brachial Doppler waveforms:         Normal            Normal Vertebral direction of flow:        Antegrade         Antegrade DUPLEX VELOCITIES (cm/sec) CCA peak systolic                   139.              104 ECA peak systolic                   89                73 ICA peak systolic                   176               50 ICA end diastolic                   67                17 PLAQUE MORPHOLOGY:                  Heterogeneous PLAQUE AMOUNT:                      Moderate          None PLAQUE LOCATION:                    ICA/ECA/distal CCA  IMPRESSION:  Doppler velocities in the right internal carotid artery suggest a 40-59% stenosis. Patent left carotid endarterectomy site with no evidence of stenosis. Velocities of the right internal carotid artery appear less than previously recorded when compared to the previous exam on 04/19/2011 with the left carotid system remaining stable.  ___________________________________________ Quita Skye. Hart Rochester, M.D.  CH/MEDQ  D:  10/15/2011  T:  10/15/2011  Job:  [id number]

## 2012-04-17 ENCOUNTER — Encounter: Payer: Self-pay | Admitting: Neurosurgery

## 2012-04-18 ENCOUNTER — Ambulatory Visit: Payer: Medicare Other | Admitting: Neurosurgery

## 2012-04-18 ENCOUNTER — Ambulatory Visit (INDEPENDENT_AMBULATORY_CARE_PROVIDER_SITE_OTHER): Payer: Medicare Other | Admitting: Neurosurgery

## 2012-04-18 ENCOUNTER — Other Ambulatory Visit (INDEPENDENT_AMBULATORY_CARE_PROVIDER_SITE_OTHER): Payer: Medicare Other | Admitting: *Deleted

## 2012-04-18 ENCOUNTER — Encounter: Payer: Self-pay | Admitting: Neurosurgery

## 2012-04-18 ENCOUNTER — Other Ambulatory Visit: Payer: Self-pay | Admitting: *Deleted

## 2012-04-18 ENCOUNTER — Encounter (INDEPENDENT_AMBULATORY_CARE_PROVIDER_SITE_OTHER): Payer: Medicare Other | Admitting: *Deleted

## 2012-04-18 VITALS — BP 156/92 | HR 64 | Resp 16 | Ht 71.0 in | Wt 190.0 lb

## 2012-04-18 DIAGNOSIS — I6529 Occlusion and stenosis of unspecified carotid artery: Secondary | ICD-10-CM

## 2012-04-18 DIAGNOSIS — I714 Abdominal aortic aneurysm, without rupture, unspecified: Secondary | ICD-10-CM

## 2012-04-18 DIAGNOSIS — Z48812 Encounter for surgical aftercare following surgery on the circulatory system: Secondary | ICD-10-CM

## 2012-04-18 NOTE — Progress Notes (Signed)
[facility] [place] AAA/Carotid Office Note  CC: AAA and carotid surveillance Referring Physician: Hart Rochester  History of Present Illness: 79 year old male patient of Dr. Hart Rochester followed for known AAA, the patient is also status post left CEA in 2008. The patient denies any signs of symptoms of CVA, TIA, amaurosis fugax, abdominal or back pain. The patient denies any new medical diagnoses or recent surgery.  Past Medical History  Diagnosis Date  . Allergy   . Hyperlipidemia   . Prostate cancer   . TIA (transient ischemic attack)   . Diplopia   . Carotid artery stenosis   . AAA (abdominal aortic aneurysm)     ROS: [x]  Positive   [ ]  Denies    General: [ ]  Weight loss, [ ]  Fever, [ ]  chills Neurologic: [ ]  Dizziness, [ ]  Blackouts, [ ]  Seizure [ ]  Stroke, [ ]  "Mini stroke", [ ]  Slurred speech, [ ]  Temporary blindness; [ ]  weakness in arms or legs, [ ]  Hoarseness Cardiac: [ ]  Chest pain/pressure, [ ]  Shortness of breath at rest [ ]  Shortness of breath with exertion, [ ]  Atrial fibrillation or irregular heartbeat Vascular: [ ]  Pain in legs with walking, [ ]  Pain in legs at rest, [ ]  Pain in legs at night,  [ ]  Non-healing ulcer, [ ]  Blood clot in vein/DVT,   Pulmonary: [ ]  Home oxygen, [ ]  Productive cough, [ ]  Coughing up blood, [ ]  Asthma,  [ ]  Wheezing Musculoskeletal:  [ ]  Arthritis, [ ]  Low back pain, [ ]  Joint pain Hematologic: [ ]  Easy Bruising, [ ]  Anemia; [ ]  Hepatitis Gastrointestinal: [ ]  Blood in stool, [ ]  Gastroesophageal Reflux/heartburn, [ ]  Trouble swallowing Urinary: [ ]  chronic Kidney disease, [ ]  on HD - [ ]  MWF or [ ]  TTHS, [ ]  Burning with urination, [ ]  Difficulty urinating Skin: [ ]  Rashes, [ ]  Wounds Psychological: [ ]  Anxiety, [ ]  Depression   Social History History  Substance Use Topics  . Smoking status: Former Smoker    Types: Cigarettes    Quit date: 03/27/1964  . Smokeless tobacco: Never Used  . Alcohol Use: Yes     Comment:  occasional    Family History Family History  Problem Relation Age of Onset  . Stroke Mother   . Heart disease Mother   . Stroke Father   . Heart attack Father   . Heart disease Brother     Allergies  Allergen Reactions  . Sudafed (Pseudoephedrine Hcl)     Causes dizziness and nervousness    Current Outpatient Prescriptions  Medication Sig Dispense Refill  . aspirin 81 MG tablet Take 81 mg by mouth daily.        . Cholecalciferol (VITAMIN D) 1000 UNITS capsule Take 1,000 Units by mouth daily.        Marland Kitchen loratadine (CLARITIN) 10 MG tablet Take 10 mg by mouth as needed.        . Multiple Vitamins-Minerals (CENTRUM SILVER PO) Take by mouth daily.        . Omega-3 Fatty Acids (FISH OIL) 1000 MG CPDR Take by mouth 3 (three) times daily.        . rosuvastatin (CRESTOR) 40 MG tablet Take 40 mg by mouth daily.      . simvastatin (ZOCOR) 40 MG tablet Take 40 mg by mouth daily.          Physical Examination  Filed Vitals:   04/18/12 1020  BP: 156/92  Pulse: 64  Resp:     Body mass index is 26.50 kg/(m^2).  General:  WDWN in NAD Gait: Normal HEENT: WNL Eyes: Pupils equal Pulmonary: normal non-labored breathing , without Rales, rhonchi,  wheezing Cardiac: RRR, without  Murmurs, rubs or gallops; Abdomen: soft, NT, no masses Skin: no rashes, ulcers noted  Vascular Exam Pulses: Palpable femoral pulses bilaterally, there is a nontender pulsatile abdominal mass palpated Carotid bruits: Carotid pulses to auscultation no bruits are heard Extremities without ischemic changes, no Gangrene , no cellulitis; no open wounds;  Musculoskeletal: no muscle wasting or atrophy   Neurologic: A&O X 3; Appropriate Affect ; SENSATION: normal; MOTOR FUNCTION:  moving all extremities equally. Speech is fluent/normal  Non-Invasive Vascular Imaging CAROTID DUPLEX 04/18/2012  Right ICA 60 - 79 % stenosis Left ICA 0 - 19% stenosis AAA duplex shows a maximum diameter of 4.5 AP of 4.0 transverse which  is increased from previous exam one year ago when he was 3.6.  ASSESSMENT/PLAN: Asymptomatic patient will followup in 6 months with repeat carotid and AAA duplex. The patient understands the signs and symptoms of CVA and AAA rupture and knows to call 911 should this occur. The patient's questions were encouraged and answered, he is in agreement with this plan.  Lauree Chandler ANP   Clinic MD: Loss

## 2012-09-25 ENCOUNTER — Other Ambulatory Visit: Payer: Self-pay | Admitting: *Deleted

## 2012-09-25 DIAGNOSIS — I714 Abdominal aortic aneurysm, without rupture: Secondary | ICD-10-CM

## 2012-09-25 DIAGNOSIS — Z48812 Encounter for surgical aftercare following surgery on the circulatory system: Secondary | ICD-10-CM

## 2012-10-17 ENCOUNTER — Other Ambulatory Visit (INDEPENDENT_AMBULATORY_CARE_PROVIDER_SITE_OTHER): Payer: Self-pay | Admitting: *Deleted

## 2012-10-17 ENCOUNTER — Encounter (INDEPENDENT_AMBULATORY_CARE_PROVIDER_SITE_OTHER): Payer: Medicare Other | Admitting: *Deleted

## 2012-10-17 ENCOUNTER — Ambulatory Visit: Payer: Medicare Other | Admitting: Neurosurgery

## 2012-10-17 DIAGNOSIS — I6529 Occlusion and stenosis of unspecified carotid artery: Secondary | ICD-10-CM

## 2012-10-17 DIAGNOSIS — Z48812 Encounter for surgical aftercare following surgery on the circulatory system: Secondary | ICD-10-CM

## 2012-10-17 DIAGNOSIS — I714 Abdominal aortic aneurysm, without rupture: Secondary | ICD-10-CM

## 2012-10-18 ENCOUNTER — Other Ambulatory Visit: Payer: Self-pay | Admitting: *Deleted

## 2012-10-18 DIAGNOSIS — Z48812 Encounter for surgical aftercare following surgery on the circulatory system: Secondary | ICD-10-CM

## 2012-10-18 DIAGNOSIS — I714 Abdominal aortic aneurysm, without rupture: Secondary | ICD-10-CM

## 2012-10-19 ENCOUNTER — Encounter: Payer: Self-pay | Admitting: Vascular Surgery

## 2013-03-22 DIAGNOSIS — M4310 Spondylolisthesis, site unspecified: Secondary | ICD-10-CM

## 2013-03-22 HISTORY — DX: Spondylolisthesis, site unspecified: M43.10

## 2013-04-18 ENCOUNTER — Inpatient Hospital Stay (HOSPITAL_COMMUNITY): Payer: Medicare Other

## 2013-04-18 ENCOUNTER — Inpatient Hospital Stay (HOSPITAL_COMMUNITY)
Admission: EM | Admit: 2013-04-18 | Discharge: 2013-04-19 | DRG: 066 | Disposition: A | Discharge status: Home / Self Care | Payer: Medicare Other | Attending: Internal Medicine | Admitting: Internal Medicine

## 2013-04-18 ENCOUNTER — Emergency Department (HOSPITAL_COMMUNITY): Payer: Medicare Other

## 2013-04-18 ENCOUNTER — Encounter (HOSPITAL_COMMUNITY): Payer: Self-pay | Admitting: Emergency Medicine

## 2013-04-18 DIAGNOSIS — I639 Cerebral infarction, unspecified: Secondary | ICD-10-CM

## 2013-04-18 DIAGNOSIS — E785 Hyperlipidemia, unspecified: Secondary | ICD-10-CM | POA: Diagnosis present

## 2013-04-18 DIAGNOSIS — I714 Abdominal aortic aneurysm, without rupture, unspecified: Secondary | ICD-10-CM | POA: Diagnosis present

## 2013-04-18 DIAGNOSIS — Z87891 Personal history of nicotine dependence: Secondary | ICD-10-CM

## 2013-04-18 DIAGNOSIS — Z8249 Family history of ischemic heart disease and other diseases of the circulatory system: Secondary | ICD-10-CM

## 2013-04-18 DIAGNOSIS — I6350 Cerebral infarction due to unspecified occlusion or stenosis of unspecified cerebral artery: Secondary | ICD-10-CM

## 2013-04-18 DIAGNOSIS — R29898 Other symptoms and signs involving the musculoskeletal system: Secondary | ICD-10-CM | POA: Diagnosis present

## 2013-04-18 DIAGNOSIS — I6789 Other cerebrovascular disease: Secondary | ICD-10-CM

## 2013-04-18 DIAGNOSIS — Z7982 Long term (current) use of aspirin: Secondary | ICD-10-CM

## 2013-04-18 DIAGNOSIS — Z823 Family history of stroke: Secondary | ICD-10-CM

## 2013-04-18 DIAGNOSIS — Z8673 Personal history of transient ischemic attack (TIA), and cerebral infarction without residual deficits: Secondary | ICD-10-CM

## 2013-04-18 DIAGNOSIS — I6529 Occlusion and stenosis of unspecified carotid artery: Secondary | ICD-10-CM | POA: Diagnosis present

## 2013-04-18 DIAGNOSIS — I63239 Cerebral infarction due to unspecified occlusion or stenosis of unspecified carotid arteries: Principal | ICD-10-CM | POA: Diagnosis present

## 2013-04-18 DIAGNOSIS — Z79899 Other long term (current) drug therapy: Secondary | ICD-10-CM

## 2013-04-18 HISTORY — DX: Cerebral infarction, unspecified: I63.9

## 2013-04-18 LAB — CBC WITH DIFFERENTIAL/PLATELET
BASOS ABS: 0 10*3/uL (ref 0.0–0.1)
BASOS PCT: 0 % (ref 0–1)
EOS ABS: 0.2 10*3/uL (ref 0.0–0.7)
Eosinophils Relative: 3 % (ref 0–5)
HCT: 40 % (ref 39.0–52.0)
Hemoglobin: 14.1 g/dL (ref 13.0–17.0)
Lymphocytes Relative: 30 % (ref 12–46)
Lymphs Abs: 2.3 10*3/uL (ref 0.7–4.0)
MCH: 31.3 pg (ref 26.0–34.0)
MCHC: 35.3 g/dL (ref 30.0–36.0)
MCV: 88.9 fL (ref 78.0–100.0)
MONOS PCT: 7 % (ref 3–12)
Monocytes Absolute: 0.5 10*3/uL (ref 0.1–1.0)
NEUTROS ABS: 4.5 10*3/uL (ref 1.7–7.7)
NEUTROS PCT: 60 % (ref 43–77)
Platelets: 153 10*3/uL (ref 150–400)
RBC: 4.5 MIL/uL (ref 4.22–5.81)
RDW: 14.2 % (ref 11.5–15.5)
WBC: 7.5 10*3/uL (ref 4.0–10.5)

## 2013-04-18 LAB — BASIC METABOLIC PANEL
BUN: 14 mg/dL (ref 6–23)
CALCIUM: 9.3 mg/dL (ref 8.4–10.5)
CO2: 26 mEq/L (ref 19–32)
CREATININE: 1.01 mg/dL (ref 0.50–1.35)
Chloride: 101 mEq/L (ref 96–112)
GFR, EST AFRICAN AMERICAN: 78 mL/min — AB (ref >90)
GFR, EST NON AFRICAN AMERICAN: 68 mL/min — AB (ref >90)
Glucose, Bld: 119 mg/dL — ABNORMAL HIGH (ref 70–99)
Potassium: 4.6 mEq/L (ref 3.7–5.3)
SODIUM: 139 meq/L (ref 137–147)

## 2013-04-18 LAB — LIPID PANEL
CHOL/HDL RATIO: 2.9 ratio
Cholesterol: 118 mg/dL (ref 0–200)
HDL: 41 mg/dL (ref >39)
LDL CALC: 57 mg/dL (ref 0–99)
Triglycerides: 99 mg/dL (ref <150)
VLDL: 20 mg/dL (ref 0–40)

## 2013-04-18 LAB — HEMOGLOBIN A1C
Hgb A1c MFr Bld: 5.8 % — ABNORMAL HIGH (ref <5.7)
Mean Plasma Glucose: 120 mg/dL — ABNORMAL HIGH (ref <117)

## 2013-04-18 LAB — CBC
HEMATOCRIT: 39.1 % (ref 39.0–52.0)
HEMOGLOBIN: 13.5 g/dL (ref 13.0–17.0)
MCH: 30.8 pg (ref 26.0–34.0)
MCHC: 34.5 g/dL (ref 30.0–36.0)
MCV: 89.1 fL (ref 78.0–100.0)
Platelets: 150 10*3/uL (ref 150–400)
RBC: 4.39 MIL/uL (ref 4.22–5.81)
RDW: 14.1 % (ref 11.5–15.5)
WBC: 7 10*3/uL (ref 4.0–10.5)

## 2013-04-18 LAB — CREATININE, SERUM
Creatinine, Ser: 1.04 mg/dL (ref 0.50–1.35)
GFR calc non Af Amer: 65 mL/min — ABNORMAL LOW (ref >90)
GFR, EST AFRICAN AMERICAN: 76 mL/min — AB (ref >90)

## 2013-04-18 LAB — POCT I-STAT TROPONIN I: TROPONIN I, POC: 0 ng/mL (ref 0.00–0.08)

## 2013-04-18 MED ORDER — ASPIRIN 325 MG PO TABS: 325.0000 mg | ORAL_TABLET | Freq: Every day | ORAL | Status: DC

## 2013-04-18 MED ORDER — ASPIRIN EC 81 MG PO TBEC
81.0000 mg | DELAYED_RELEASE_TABLET | Freq: Every morning | ORAL | Status: DC
  Administered 2013-04-19: 81 mg via ORAL
  Filled 2013-04-18: qty 1

## 2013-04-18 MED ORDER — LORATADINE 10 MG PO TABS
10.0000 mg | ORAL_TABLET | Freq: Every day | ORAL | Status: DC
  Administered 2013-04-18 – 2013-04-19 (×2): 10 mg via ORAL
  Filled 2013-04-18 (×2): qty 1

## 2013-04-18 MED ORDER — OMEGA-3-ACID ETHYL ESTERS 1 G PO CAPS
1.0000 g | ORAL_CAPSULE | Freq: Every day | ORAL | Status: DC
  Administered 2013-04-18 – 2013-04-19 (×2): 1 g via ORAL
  Filled 2013-04-18 (×2): qty 1

## 2013-04-18 MED ORDER — SENNOSIDES-DOCUSATE SODIUM 8.6-50 MG PO TABS: 1.0000 | ORAL_TABLET | Freq: Every evening | ORAL | Status: DC | PRN

## 2013-04-18 MED ORDER — ENOXAPARIN SODIUM 40 MG/0.4ML ~~LOC~~ SOLN
40.0000 mg | SUBCUTANEOUS | Status: DC
  Administered 2013-04-18: 40 mg via SUBCUTANEOUS
  Filled 2013-04-18 (×2): qty 0.4

## 2013-04-18 MED ORDER — ATORVASTATIN CALCIUM 10 MG PO TABS
10.0000 mg | ORAL_TABLET | Freq: Every day | ORAL | Status: DC
  Filled 2013-04-18: qty 1

## 2013-04-18 MED ORDER — CEPHALEXIN 500 MG PO CAPS
500.0000 mg | ORAL_CAPSULE | Freq: Two times a day (BID) | ORAL | Status: DC
  Administered 2013-04-18 – 2013-04-19 (×2): 500 mg via ORAL
  Filled 2013-04-18 (×3): qty 1

## 2013-04-18 MED ORDER — ASPIRIN 300 MG RE SUPP: 300.0000 mg | Freq: Every day | RECTAL | Status: DC

## 2013-04-18 NOTE — Plan of Care (Signed)
Pt arrived to the unit via stretcher. Alert and oriented x 4. Spouse at bedside. Oriented to room. Pt is stable.

## 2013-04-18 NOTE — H&P (Signed)
[facility]          History and Physical    PCP:   Osborne Casco, MD   Chief Complaint:  Left arm weakness with decreased grip strength  HPI: Patient is an 80 year old man with past medical history significant for bilateral carotid artery stenosis for which he underwent a left-sided carotid endarterectomy with Dr. Kellie Simmering a while back, also has a history of hyperlipidemia. He states that today at around 9:00 am he noticed difficulty picking things up with his left hand. He got concerned when he couldn't adequately grip his golf club. He did not have any slurred speech, headache, double vision, language disturbance. He states that since being in the hospital his left arm is much improved however not back to his baseline. Incidentally, he mentions that about 3 days ago he had another transient episode of numbness of his left hand where he noticed he couldn't pick up a glass normally but did not tell his wife and did not seek medical attention at that time. In the emergency department a CT scan of the brain shows a small area of low attenuation in the anterior limb of the right internal capsule which could indicate a subacute lacunar infarction. He has been evaluated by neurology who has recommended a medical admission for stroke workup. Hospitalist admission was requested.   Allergies:   Allergies  Allergen Reactions  . Sudafed [Pseudoephedrine Hcl]     Causes dizziness and nervousness      Past Medical History  Diagnosis Date  . Allergy   . Hyperlipidemia   . Prostate cancer   . TIA (transient ischemic attack)   . Diplopia   . Carotid artery stenosis   . AAA (abdominal aortic aneurysm)     Past Surgical History  Procedure Laterality Date  . Foot surgery       left Achilles Tendon Repair  . Eye surgery      Bilateral cataract   . Carotid endarterectomy  Sept. 2008    Elective left carotid endarterectomy    Prior to Admission medications    Medication Sig Start Date End Date Taking? Authorizing Provider  acetaminophen (TYLENOL) 500 MG tablet Take 1,000 mg by mouth every 4 (four) hours as needed for mild pain.   Yes Historical Provider, MD  aspirin EC 81 MG tablet Take 81 mg by mouth every morning.   Yes Historical Provider, MD  cephALEXin (KEFLEX) 500 MG capsule Take 500 mg by mouth 3 (three) times daily. 04/17/13 04/22/13 Yes Historical Provider, MD  Fexofenadine HCl (ALLEGRA PO) Take 1 tablet by mouth every morning.   Yes Historical Provider, MD  Multiple Vitamins-Minerals (CENTRUM SILVER PO) Take 1 tablet by mouth daily.    Yes Historical Provider, MD  Omega-3 Fatty Acids (FISH OIL) 1000 MG CPDR Take 1,000 mg by mouth every morning.    Yes Historical Provider, MD  rosuvastatin (CRESTOR) 5 MG tablet Take 5 mg by mouth at bedtime.    Historical Provider, MD    Social History:  reports that he quit smoking about 49 years ago. His smoking use included Cigarettes. He smoked 0.00 packs per day. He has never used smokeless tobacco. He reports that he drinks alcohol. He reports that he does not use illicit drugs.  Family History  Problem Relation Age of Onset  . Stroke Mother   . Heart disease Mother   . Stroke Father   . Heart attack Father   . Heart disease Brother  Review of Systems:  Constitutional: Denies fever, chills, diaphoresis, appetite change and fatigue.  HEENT: Denies photophobia, eye pain, redness, hearing loss, ear pain, congestion, sore throat, rhinorrhea, sneezing, mouth sores, trouble swallowing, neck pain, neck stiffness and tinnitus.   Respiratory: Denies SOB, DOE, cough, chest tightness,  and wheezing.   Cardiovascular: Denies chest pain, palpitations and leg swelling.  Gastrointestinal: Denies nausea, vomiting, abdominal pain, diarrhea, constipation, blood in stool and abdominal distention.  Genitourinary: Denies dysuria, urgency, frequency, hematuria, flank pain and difficulty urinating.  Endocrine:  Denies: hot or cold intolerance, sweats, changes in hair or nails, polyuria, polydipsia. Musculoskeletal: Denies myalgias, back pain, joint swelling, arthralgias and gait problem.  Skin: Denies pallor, rash and wound.  Neurological: Denies dizziness, seizures, syncope,light-headedness and headaches.  Hematological: Denies adenopathy. Easy bruising, personal or family bleeding history  Psychiatric/Behavioral: Denies suicidal ideation, mood changes, confusion, nervousness, sleep disturbance and agitation   Physical Exam: Blood pressure 156/76, pulse 65, temperature 97.8 F (36.6 C), temperature source Oral, resp. rate 18, height $RemoveBe'5\' 10"'KhEiZsIjG$  (1.778 m), weight 83.008 kg (183 lb), SpO2 95.00%. General: Alert, awake, oriented x3, in no distress. HEENT: Normocephalic, atraumatic, pupils equal round and reactive to light, extraocular movements intact. Neck: Supple, no JVD, no lymphadenopathy, no goiter. Cardiovascular: Regular rate and rhythm, no murmurs, rubs or gallops. Lungs: Clear to auscultation bilaterally. Abdomen: Soft, nontender, nondistended, positive bowel sounds, no masses or organomegaly noted. Extremities: No clubbing, cyanosis or edema, positive pedal pulses. Neurologic: Mental status intact, speech is fluent without aphasia. Follows 3 step commands without difficulty. Cranial nerves II through XII intact. Motor strength is 5 over 5 bilateral and symmetric, sensation is intact to light touch symmetric and bilateral, he has mild left dysmetria, Babinski is downgoing bilaterally.  Labs on Admission:  Results for orders placed during the hospital encounter of 04/18/13 (from the past 48 hour(s))  CBC WITH DIFFERENTIAL     Status: None   Collection Time    04/18/13 11:05 AM      Result Value Range   WBC 7.5  4.0 - 10.5 K/uL   RBC 4.50  4.22 - 5.81 MIL/uL   Hemoglobin 14.1  13.0 - 17.0 g/dL   HCT 40.0  39.0 - 52.0 %   MCV 88.9  78.0 - 100.0 fL   MCH 31.3  26.0 - 34.0 pg   MCHC 35.3  30.0  - 36.0 g/dL   RDW 14.2  11.5 - 15.5 %   Platelets 153  150 - 400 K/uL   Neutrophils Relative % 60  43 - 77 %   Neutro Abs 4.5  1.7 - 7.7 K/uL   Lymphocytes Relative 30  12 - 46 %   Lymphs Abs 2.3  0.7 - 4.0 K/uL   Monocytes Relative 7  3 - 12 %   Monocytes Absolute 0.5  0.1 - 1.0 K/uL   Eosinophils Relative 3  0 - 5 %   Eosinophils Absolute 0.2  0.0 - 0.7 K/uL   Basophils Relative 0  0 - 1 %   Basophils Absolute 0.0  0.0 - 0.1 K/uL  BASIC METABOLIC PANEL     Status: Abnormal   Collection Time    04/18/13 11:05 AM      Result Value Range   Sodium 139  137 - 147 mEq/L   Potassium 4.6  3.7 - 5.3 mEq/L   Chloride 101  96 - 112 mEq/L   CO2 26  19 - 32 mEq/L   Glucose, Bld 119 (*)  70 - 99 mg/dL   BUN 14  6 - 23 mg/dL   Creatinine, Ser 1.01  0.50 - 1.35 mg/dL   Calcium 9.3  8.4 - 10.5 mg/dL   GFR calc non Af Amer 68 (*) >90 mL/min   GFR calc Af Amer 78 (*) >90 mL/min   Comment: (NOTE)     The eGFR has been calculated using the CKD EPI equation.     This calculation has not been validated in all clinical situations.     eGFR's persistently <90 mL/min signify possible Chronic Kidney     Disease.  POCT I-STAT TROPONIN I     Status: None   Collection Time    04/18/13 11:16 AM      Result Value Range   Troponin i, poc 0.00  0.00 - 0.08 ng/mL   Comment 3            Comment: Due to the release kinetics of cTnI,     a negative result within the first hours     of the onset of symptoms does not rule out     myocardial infarction with certainty.     If myocardial infarction is still suspected,     repeat the test at appropriate intervals.  LIPID PANEL     Status: None   Collection Time    04/18/13  1:40 PM      Result Value Range   Cholesterol 118  0 - 200 mg/dL   Triglycerides 99  <150 mg/dL   HDL 41  >39 mg/dL   Total CHOL/HDL Ratio 2.9     VLDL 20  0 - 40 mg/dL   LDL Cholesterol 57  0 - 99 mg/dL   Comment:            Total Cholesterol/HDL:CHD Risk     Coronary Heart Disease  Risk Table                         Men   Women      1/2 Average Risk   3.4   3.3      Average Risk       5.0   4.4      2 X Average Risk   9.6   7.1      3 X Average Risk  23.4   11.0                Use the calculated Patient Ratio     above and the CHD Risk Table     to determine the patient's CHD Risk.                ATP III CLASSIFICATION (LDL):      <100     mg/dL   Optimal      100-129  mg/dL   Near or Above                        Optimal      130-159  mg/dL   Borderline      160-189  mg/dL   High      >190     mg/dL   Very High    Radiological Exams on Admission: Ct Head Wo Contrast  04/18/2013   CLINICAL DATA:  Left-sided arm weakness.  Dizziness.  EXAM: CT HEAD WITHOUT CONTRAST  TECHNIQUE: Contiguous axial images were obtained from the base of the skull through  the vertex without intravenous contrast.  COMPARISON:  No priors.  FINDINGS: Images 13 and 14 of series 2 demonstrate a subtle area of low attenuation in the anterior limb of the right internal capsule, which could represent a subacute lacunar infarction. No other potential acute intracranial abnormalities. Specifically, no evidence of acute intracranial hemorrhage, no mass, mass effect, hydrocephalus or abnormal intra or extra-axial fluid collections. Visualized paranasal sinuses and mastoids are well pneumatized, with exception of a small left mastoid effusion. Postoperative changes of bilateral maxillary antrectomy are noted. No acute displaced skull fractures are identified.  IMPRESSION: 1. Subtle area of low attenuation in the anterior limb of the right internal capsule, which could indicate a subacute lacunar infarction. This could be further evaluated with an MRI of the brain if clinically appropriate. 2. Small left mastoid effusion. These results were called by telephone at the time of interpretation on 04/18/2013 at 12:03 PM to Dr. Tanna Furry, who verbally acknowledged these results.   Electronically Signed   By: Vinnie Langton M.D.   On: 04/18/2013 12:05    Assessment/Plan Principal Problem:   CVA (cerebral infarction) Active Problems:   Other and unspecified hyperlipidemia   Carotid stenosis   Right brain CVA -Apparent on CT scan. -Will admit to telemetry for a CVA workup. -Carotid Dopplers have already been completed and show an 80-99% right ICA stenosis. -I have spoken with vascular surgery, Dr. Kellie Simmering, who will see patient in consultation in the morning. -MRI/MRA, 2-D echo. -PT/OT/ST evaluations. -Continue aspirin for secondary stroke prevention. -Check fasting lipid profile and consider adjusting statin dose if necessary.  History of carotid stenosis -See above for details.  Hyperlipidemia -Continue statin.  DVT prophylaxis -Lovenox.  CODE STATUS -Full code   Time Spent on Admission: 85 minutes  HERNANDEZ ACOSTA,ESTELA [facility] Pager: [phone] 04/18/2013, 6:25 PM

## 2013-04-18 NOTE — Progress Notes (Signed)
*  PRELIMINARY RESULTS* Vascular Ultrasound Carotid Duplex (Doppler) has been completed.  Findings suggest 80-99% right internal carotid artery stenosis, and 1-39% left internal carotid artery stenosis. Vertebral arteries are patent with antegrade flow.  When compared with the prior study from 10/17/2012, the right ICA stenosis has increased.  04/18/2013 5:37 PM Maudry Mayhew, RVT, RDCS, RDMS

## 2013-04-18 NOTE — Consult Note (Signed)
NEURO HOSPITALIST CONSULT NOTE    Reason for Consult: left arm weakness  HPI:                                                                                                                                          [name] [name] is an 80 y.o. male, right handed, with a past medical history significant for hyperlipidemia, s/p left CEA, known right carotid stenosis follow by vascular surgery, TIA, AAA, prostate cancer, brought in by EMS due to acute onset left arm weakness earlier this morning. He was last known well around 9 am this morning when he noticed that he was having difficulty picking things up with his left hand. He said that he couldn't pick and lift up a golf club. No associated numbness, HA, vertigo, double vision, slurred speech, confusion, language or vision disturbance, chest pain, or palpitations. He said that his left arm is " much better but still doesn't feel completely normal. Takes aspirin 81 mg daily but it was held x 1 week due to sinus surgery. Of note, he said that 3 days ago he had a transient episode of " numbness of my left hand, I couldn't pick up a glass as I normally do". NIHSS 1 (mild left dysmetria). CT brain showed a small area of low attenuation in the anterior limb of the right internal capsule, which could indicate a subacute lacunar infarction.   Past Medical History  Diagnosis Date  . Allergy   . Hyperlipidemia   . Prostate cancer   . TIA (transient ischemic attack)   . Diplopia   . Carotid artery stenosis   . AAA (abdominal aortic aneurysm)     Past Surgical History  Procedure Laterality Date  . Foot surgery       left Achilles Tendon Repair  . Eye surgery      Bilateral cataract   . Carotid endarterectomy  Sept. 2008    Elective left carotid endarterectomy    Family History  Problem Relation Age of Onset  . Stroke Mother   . Heart disease Mother   . Stroke Father   . Heart attack Father   . Heart disease Brother      Family History:   Social History:  reports that he quit smoking about 49 years ago. His smoking use included Cigarettes. He smoked 0.00 packs per day. He has never used smokeless tobacco. He reports that he drinks alcohol. He reports that he does not use illicit drugs.  Allergies  Allergen Reactions  . Sudafed [Pseudoephedrine Hcl]     Causes dizziness and nervousness    MEDICATIONS:  I have reviewed the patient's current medications.   ROS:                                                                                                                                       History obtained from the patient, wife, and chart review.  General ROS: negative for - chills, fatigue, fever, night sweats, weight gain or weight loss Psychological ROS: negative for - behavioral disorder, hallucinations, memory difficulties, mood swings or suicidal ideation Ophthalmic ROS: negative for - blurry vision, double vision, eye pain or loss of vision ENT ROS: negative for - epistaxis, oral lesions, sore throat, tinnitus or vertigo Allergy and Immunology ROS: negative for - hives or itchy/watery eyes Hematological and Lymphatic ROS: negative for - bleeding problems, bruising or swollen lymph nodes Endocrine ROS: negative for - galactorrhea, hair pattern changes, polydipsia/polyuria or temperature intolerance Respiratory ROS: negative for - cough, hemoptysis, shortness of breath or wheezing Cardiovascular ROS: negative for - chest pain, dyspnea on exertion, edema or irregular heartbeat Gastrointestinal ROS: negative for - abdominal pain, diarrhea, hematemesis, nausea/vomiting or stool incontinence Genito-Urinary ROS: negative for - dysuria, hematuria, incontinence or urinary frequency/urgency Musculoskeletal ROS: negative for - joint swelling or muscular weakness Neurological ROS: as  noted in HPI Dermatological ROS: negative for rash and skin lesion changes   Physical exam: pleasant male in no apparent distress. Blood pressure 130/86, pulse 77, temperature 97.8 F (36.6 C), temperature source Oral, resp. rate 20, SpO2 96.00%. Head: normocephalic. Neck: supple, no bruits, no JVD. Cardiac: no murmurs. Lungs: clear. Abdomen: soft, no tender, no mass. Extremities: no edema.  Neurologic Examination:                                                                                                      Mental Status: Alert, oriented, thought content appropriate.  Speech fluent without evidence of aphasia.  Able to follow 3 step commands without difficulty. Cranial Nerves: II: Discs flat bilaterally; Visual fields grossly normal, pupils equal, round, reactive to light and accommodation III,IV, VI: ptosis not present, extra-ocular motions intact bilaterally V,VII: smile symmetric, facial light touch sensation normal bilaterally VIII: hearing normal bilaterally IX,X: gag reflex present XI: bilateral shoulder shrug XII: midline tongue extension without atrophy or fasciculations  Motor: Right : Upper extremity   5/5    Left:     Upper extremity   5/5  Lower extremity   5/5     Lower extremity   5/5 Tone and bulk:normal tone  throughout; no atrophy noted Sensory: Pinprick and light touch intact throughout, bilaterally Deep Tendon Reflexes:  Right: Upper Extremity   Left: Upper extremity   biceps (C-5 to C-6) 2/4   biceps (C-5 to C-6) 2/4 tricep (C7) 2/4    triceps (C7) 2/4 Brachioradialis (C6) 2/4  Brachioradialis (C6) 2/4  Lower Extremity Lower Extremity  quadriceps (L-2 to L-4) 2/4   quadriceps (L-2 to L-4) 2/4 Achilles (S1) 2/4   Achilles (S1) 2/4  Plantars: Right: downgoing   Left: downgoing Cerebellar: Significant for mild left dysmetria.  Gait:  No tested. CV: pulses palpable throughout    No results found for this basename: cbc, bmp, coags, chol, tri,  ldl, hga1c    Results for orders placed during the hospital encounter of 04/18/13 (from the past 48 hour(s))  CBC WITH DIFFERENTIAL     Status: None   Collection Time    04/18/13 11:05 AM      Result Value Range   WBC 7.5  4.0 - 10.5 K/uL   RBC 4.50  4.22 - 5.81 MIL/uL   Hemoglobin 14.1  13.0 - 17.0 g/dL   HCT 40.0  39.0 - 52.0 %   MCV 88.9  78.0 - 100.0 fL   MCH 31.3  26.0 - 34.0 pg   MCHC 35.3  30.0 - 36.0 g/dL   RDW 14.2  11.5 - 15.5 %   Platelets 153  150 - 400 K/uL   Neutrophils Relative % 60  43 - 77 %   Neutro Abs 4.5  1.7 - 7.7 K/uL   Lymphocytes Relative 30  12 - 46 %   Lymphs Abs 2.3  0.7 - 4.0 K/uL   Monocytes Relative 7  3 - 12 %   Monocytes Absolute 0.5  0.1 - 1.0 K/uL   Eosinophils Relative 3  0 - 5 %   Eosinophils Absolute 0.2  0.0 - 0.7 K/uL   Basophils Relative 0  0 - 1 %   Basophils Absolute 0.0  0.0 - 0.1 K/uL  BASIC METABOLIC PANEL     Status: Abnormal   Collection Time    04/18/13 11:05 AM      Result Value Range   Sodium 139  137 - 147 mEq/L   Potassium 4.6  3.7 - 5.3 mEq/L   Chloride 101  96 - 112 mEq/L   CO2 26  19 - 32 mEq/L   Glucose, Bld 119 (*) 70 - 99 mg/dL   BUN 14  6 - 23 mg/dL   Creatinine, Ser 1.01  0.50 - 1.35 mg/dL   Calcium 9.3  8.4 - 10.5 mg/dL   GFR calc non Af Amer 68 (*) >90 mL/min   GFR calc Af Amer 78 (*) >90 mL/min   Comment: (NOTE)     The eGFR has been calculated using the CKD EPI equation.     This calculation has not been validated in all clinical situations.     eGFR's persistently <90 mL/min signify possible Chronic Kidney     Disease.  POCT I-STAT TROPONIN I     Status: None   Collection Time    04/18/13 11:16 AM      Result Value Range   Troponin i, poc 0.00  0.00 - 0.08 ng/mL   Comment 3            Comment: Due to the release kinetics of cTnI,     a negative result within the first hours     of the  onset of symptoms does not rule out     myocardial infarction with certainty.     If myocardial infarction is  still suspected,     repeat the test at appropriate intervals.    Ct Head Wo Contrast  04/18/2013   CLINICAL DATA:  Left-sided arm weakness.  Dizziness.  EXAM: CT HEAD WITHOUT CONTRAST  TECHNIQUE: Contiguous axial images were obtained from the base of the skull through the vertex without intravenous contrast.  COMPARISON:  No priors.  FINDINGS: Images 13 and 14 of series 2 demonstrate a subtle area of low attenuation in the anterior limb of the right internal capsule, which could represent a subacute lacunar infarction. No other potential acute intracranial abnormalities. Specifically, no evidence of acute intracranial hemorrhage, no mass, mass effect, hydrocephalus or abnormal intra or extra-axial fluid collections. Visualized paranasal sinuses and mastoids are well pneumatized, with exception of a small left mastoid effusion. Postoperative changes of bilateral maxillary antrectomy are noted. No acute displaced skull fractures are identified.  IMPRESSION: 1. Subtle area of low attenuation in the anterior limb of the right internal capsule, which could indicate a subacute lacunar infarction. This could be further evaluated with an MRI of the brain if clinically appropriate. 2. Small left mastoid effusion. These results were called by telephone at the time of interpretation on 04/18/2013 at 12:03 PM to Dr. Tanna Furry, who verbally acknowledged these results.   Electronically Signed   By: Vinnie Langton M.D.   On: 04/18/2013 12:05   Assessment/Plan: 80 y.o. male, right handed, with a past medical history significant for hyperlipidemia, s/p left CEA, known right carotid stenosis follow by vascular surgery, TIA, AAA, prostate cancer, brought in by EMS due to acute onset left arm weakness around 9 am today. NIHSS 1 (mild left dysmetria) and CT brain revealing a probable small subacute right internal capsule lacunar infarct. No a candidate for thrombolysis due to NIHSS 1, and out of the window to consider  enrollment in the PRISM trial. Will contact stroke research nurse coordinator to see if patient is a candidate for POINT trial. Recommend: 1) Admission to medicine. 2) complete stroke work up. 3) Will follow up.   Dorian Pod, MD 04/18/2013, 12:27 PM

## 2013-04-18 NOTE — ED Notes (Signed)
Per EMS pt from home with c/o left arm weakness when he lifted up a golf club. Pt had sinus surgery yesterday. About 40 min ago went to pick up golf club and felt left arm was weaker. Stroke screen negative. Grips equal, no slurred speech or facial droop. CBG 116.

## 2013-04-18 NOTE — Progress Notes (Signed)
  Echocardiogram 2D Echocardiogram has been performed.  Philipp Deputy 04/18/2013, 4:54 PM

## 2013-04-18 NOTE — ED Notes (Signed)
Neuro at bedside.

## 2013-04-19 ENCOUNTER — Encounter (HOSPITAL_COMMUNITY): Payer: Self-pay | Admitting: Pharmacy Technician

## 2013-04-19 ENCOUNTER — Other Ambulatory Visit: Payer: Self-pay

## 2013-04-19 DIAGNOSIS — I714 Abdominal aortic aneurysm, without rupture, unspecified: Secondary | ICD-10-CM

## 2013-04-19 DIAGNOSIS — I6529 Occlusion and stenosis of unspecified carotid artery: Secondary | ICD-10-CM

## 2013-04-19 DIAGNOSIS — I63239 Cerebral infarction due to unspecified occlusion or stenosis of unspecified carotid arteries: Principal | ICD-10-CM | POA: Diagnosis present

## 2013-04-19 LAB — HEMOGLOBIN A1C
HEMOGLOBIN A1C: 5.9 % — AB (ref <5.7)
MEAN PLASMA GLUCOSE: 123 mg/dL — AB (ref <117)

## 2013-04-19 LAB — LIPID PANEL
Cholesterol: 117 mg/dL (ref 0–200)
HDL: 39 mg/dL — AB (ref >39)
LDL Cholesterol: 62 mg/dL (ref 0–99)
TRIGLYCERIDES: 80 mg/dL (ref <150)
Total CHOL/HDL Ratio: 3 RATIO
VLDL: 16 mg/dL (ref 0–40)

## 2013-04-19 MED ORDER — CLOPIDOGREL BISULFATE 75 MG PO TABS: 75.0000 mg | ORAL_TABLET | Freq: Every day | ORAL | Status: DC

## 2013-04-19 MED ORDER — CLOPIDOGREL BISULFATE 75 MG PO TABS
75.0000 mg | ORAL_TABLET | Freq: Every day | ORAL | Status: DC
  Administered 2013-04-19: 75 mg via ORAL
  Filled 2013-04-19: qty 1

## 2013-04-19 NOTE — Plan of Care (Signed)
D/c instructions given to pt. Med script given to pt. Tele removed pt is stable to be d/c.

## 2013-04-19 NOTE — Progress Notes (Signed)
   CARE MANAGEMENT NOTE 04/19/2013  Patient:  [name] [name],[name] [name]   Account Number:  [id number]  Date Initiated:  04/19/2013  Documentation initiated by:  Olga Coaster  Subjective/Objective Assessment:   ADMITTED WITH STROKE     Action/Plan:   CM FOLLOWING FOR DCP   Anticipated DC Date:  04/24/2013   Anticipated DC Plan:  [place]  CM consult         Status of service:  In process, will continue to follow Medicare Important Message given?  NA - LOS <3 / Initial given by admissions (If response is "NO", the following Medicare IM given date fields will be blank)  Per UR Regulation:  Reviewed for med. necessity/level of care/duration of stay  Comments:  1/29/2015Mindi Slicker RN,BSN,MHA [phone]

## 2013-04-19 NOTE — Discharge Summary (Signed)
Physician Discharge Summary  [name] [name] [id number] DOB: August 25, 1929 DOA: 04/18/2013  PCP: Osborne Casco, MD  Admit date: 04/18/2013 Discharge date: 04/19/2013  Time spent: 65 minutes  Recommendations for Outpatient Follow-up:  1. Followup with Dr. Kellie Simmering in one week or as previously scheduled as outpatient for elective right carotid endarterectomy. 2. Followup with Dr. Leonie Man of neurology in 2 months. 3. Followup with PCP in 1 week. Patient's 2-D echo during this hospitalization with EF= 45-50% with diffuse hypokinesis. Patient was asymptomatic. Previous 2-D echo will need to be compared to if one available at PCPs office. Patient may benefit from referral to outpatient cardiology.  Discharge Diagnoses:  Principal Problem:   CVA (cerebral infarction) Active Problems:   Occlusion and stenosis of carotid artery with cerebral infarction   Other and unspecified hyperlipidemia   Carotid stenosis   Discharge Condition: Stable and improved  Diet recommendation: Heart healthy  Filed Weights   04/18/13 1425  Weight: 83.008 kg (183 lb)    History of present illness:  Patient is an 80 year old man with past medical history significant for bilateral carotid artery stenosis for which he underwent a left-sided carotid endarterectomy with Dr. Kellie Simmering a while back, also has a history of hyperlipidemia. He states that today at around 9:00 am he noticed difficulty picking things up with his left hand. He got concerned when he couldn't adequately grip his golf club. He did not have any slurred speech, headache, double vision, language disturbance. He states that since being in the hospital his left arm is much improved however not back to his baseline. Incidentally, he mentions that about 3 days ago he had another transient episode of numbness of his left hand where he noticed he couldn't pick up a glass normally but did not tell his wife and did not seek medical attention at that time. In the  emergency department a CT scan of the brain shows a small area of low attenuation in the anterior limb of the right internal capsule which could indicate a subacute lacunar infarction. He has been evaluated by neurology who has recommended a medical admission for stroke workup. Hospitalist admission was requested.      Hospital Course:  #1 severe right ICA stenosis with right brain CVA per CT head and MRI head with acute/subacute nonhemorrhagic infarct involving the posterior right frontal lobe along the primary motor cortex. Patient was admitted with weakness in the left hand with decreased grip which improved rapidly during the hospitalization and due to delayed presentation as such patient was noted not to be a TPA candidate. Patient was admitted to telemetry and underwent a stroke workup. CT head obtained showed an area revealing a probable small subacute right internal capsule lacuna infarct. Neurology was consulted and a stroke workup undertaken. MRI of the head was obtained which showed an acute/subacute nonhemorrhagic infarct involving the posterior right frontal lobe along the primary motor cortex. 2-D echo which was obtained showed an EF of 45-50% with diffuse hypokinesis. There was no prior 2-D echo to compare to. 2-D echo did not have any source of emboli. Carotid Dopplers which were obtained showed 80-99% right internal carotid artery stenosis and 1-39% left internal carotid artery stenosis. LDL which was obtained came back at 62. Patient was already on a statin. Patient was initially maintained on a baby aspirin that he was on. A vascular consultation was obtained and patient was seen in consultation by Dr. Kellie Simmering. It was recommended that patient have an elective right carotid endarterectomy in about  8 days which has been scheduled for Friday, February 6. Was recommended to start patient on Plavix 75 mg daily. Patient will be discharged on aspirin and Plavix. Patient will followup with Dr.  Kellie Simmering of vascular surgery as well as with Dr. Leonie Man of neurology. Patient will be discharged in stable and improved condition.  #2 known small abdominal aortic aneurysm measuring 4.5 cm  03/2012. Will need outpatient followup with Dr. Kellie Simmering.   #3 hyperlipidemia Fasting lipid panel was obtained and LDL came back at 62. Patient was maintained on his home regimen of statin.  #4 history of carotid stenosis See problem #1.  Procedures:  Carotid doppler 04/18/13  2-D echo 04/18/2013  MRI MRA of the head 04/19/2013  CT head 04/18/2013  Consultations:  Neurology : Dr Aram Beecham 04/18/13  Vascular Surgery: Dr Kellie Simmering 04/19/13  Discharge Exam: Filed Vitals:   04/19/13 1331  BP: 150/82  Pulse: 88  Temp: 97.7 F (36.5 C)  Resp: 18    General: NAD Cardiovascular: RRR Respiratory: Soft/NT/ND/+BS  Discharge Instructions      Discharge Orders   Future Appointments Provider Department Dept Phone   10/16/2013 9:00 AM [facility] [place] [phone]   Eat a light meal the night before the exam Nothing to eat or drink for at least 8 hours before exam No gum chewing, or smoking the morning of the exam. Please take your morning medications with small sips of water, especially blood pressure medication *Very Important* Please wear 2 piece clothing   10/16/2013 9:30 AM [facility] [place] [phone]   10/16/2013 10:30 AM Mal Misty, MD [facility] [phone]   Future Orders Complete By Expires   Diet - low sodium heart healthy  As directed    Discharge instructions  As directed    Comments:     Follow up with Dr Kellie Simmering in 1 week or as scheduled. Follow up with Dr Leonie Man in 2 months. Follow up with Osborne Casco, MD in 1 week.   Increase activity slowly  As directed        Medication List         acetaminophen 500 MG tablet  Commonly known as:  TYLENOL  Take 1,000 mg by  mouth every 4 (four) hours as needed for mild pain.     ALLEGRA PO  Take 1 tablet by mouth every morning.     aspirin EC 81 MG tablet  Take 81 mg by mouth every morning.     CENTRUM SILVER PO  Take 1 tablet by mouth daily.     cephALEXin 500 MG capsule  Commonly known as:  KEFLEX  Take 500 mg by mouth 3 (three) times daily.     clopidogrel 75 MG tablet  Commonly known as:  PLAVIX  Take 1 tablet (75 mg total) by mouth daily with breakfast.     Fish Oil 1000 MG Cpdr  Take 1,000 mg by mouth every morning.     rosuvastatin 5 MG tablet  Commonly known as:  CRESTOR  Take 5 mg by mouth at bedtime.       Allergies  Allergen Reactions  . Sudafed [Pseudoephedrine Hcl]     Causes dizziness and nervousness   Follow-up Information   Follow up with [name], MD. Schedule an appointment as soon as possible for a visit in 1 week.   Specialty:  Vascular Surgery   Contact information:   [place]  [place] [address] [phone]       Follow up with Forbes Cellar, MD. Schedule an appointment as soon as possible for a visit in 2 months.   Specialties:  Neurology, Radiology   Contact information:   [address] [place] [address] [phone]       Follow up with Osborne Casco, MD. Schedule an appointment as soon as possible for a visit in 1 week.   Specialty:  Family Medicine   Contact information:   [address] [place] [address] [phone]        The results of significant diagnostics from this hospitalization (including imaging, microbiology, ancillary and laboratory) are listed below for reference.    Significant Diagnostic Studies: Dg Chest 2 View  04/18/2013   CLINICAL DATA:  History of CVA and peripheral vascular disease and abdominal aortic aneurysm.  EXAM: CHEST  2 VIEW  COMPARISON:  Insert previous  FINDINGS: Since the previous study there have developed increased interstitial markings diffusely  most conspicuously on the left. There is no alveolar infiltrate. The cardiac silhouette is not enlarged. The pulmonary vascularity is not engorged. There is no pleural effusion or pneumothorax. The mediastinum is normal in width. There is mild tortuosity of the descending thoracic aorta. The observed portions of the bony thorax exhibit no acute abnormalities.  IMPRESSION: Diffusely increased interstitial markings greater on the left likely reflect interval development of pulmonary fibrosis. Acute pneumonitis could produce similar findings but given the patient's asymptomatic state, this is felt to be unlikely.   Electronically Signed   By: David  Martinique   On: 04/18/2013 21:55   Ct Head Wo Contrast  04/18/2013   CLINICAL DATA:  Left-sided arm weakness.  Dizziness.  EXAM: CT HEAD WITHOUT CONTRAST  TECHNIQUE: Contiguous axial images were obtained from the base of the skull through the vertex without intravenous contrast.  COMPARISON:  No priors.  FINDINGS: Images 13 and 14 of series 2 demonstrate a subtle area of low attenuation in the anterior limb of the right internal capsule, which could represent a subacute lacunar infarction. No other potential acute intracranial abnormalities. Specifically, no evidence of acute intracranial hemorrhage, no mass, mass effect, hydrocephalus or abnormal intra or extra-axial fluid collections. Visualized paranasal sinuses and mastoids are well pneumatized, with exception of a small left mastoid effusion. Postoperative changes of bilateral maxillary antrectomy are noted. No acute displaced skull fractures are identified.  IMPRESSION: 1. Subtle area of low attenuation in the anterior limb of the right internal capsule, which could indicate a subacute lacunar infarction. This could be further evaluated with an MRI of the brain if clinically appropriate. 2. Small left mastoid effusion. These results were called by telephone at the time of interpretation on 04/18/2013 at 12:03 PM to Dr.  Tanna Furry, who verbally acknowledged these results.   Electronically Signed   By: Vinnie Langton M.D.   On: 04/18/2013 12:05   Mr Brain Wo Contrast  04/19/2013   CLINICAL DATA:  And  EXAM: MRI HEAD WITHOUT CONTRAST  MRA HEAD WITHOUT CONTRAST  TECHNIQUE: Multiplanar, multiecho pulse sequences of the brain and surrounding structures were obtained without intravenous contrast. Angiographic images of the head were obtained using MRA technique without contrast.  COMPARISON:  CT head without contrast 04/18/2013.  FINDINGS: MRI HEAD FINDINGS  The diffusion-weighted images demonstrating nonhemorrhagic infarct involving the cortex the posterior right frontal lobe, corresponding with the patient's left hand symptoms. No hemorrhage or mass lesion is present. T2 changes  are evident in the area of infarction.  Mild generalized atrophy and periventricular white matter changes are seen bilaterally. Mild mucosal thickening is present in the right maxillary sinus. There some mucosal thickening in the anterior ethmoid air cells as well.  Flow is present in the major intracranial arteries. The patient is status post bilateral lens extractions. The globes and orbits are otherwise intact.  MRA HEAD FINDINGS  The internal carotid arteries are within normal limits from the high cervical segments through the ICA termini bilaterally. The A1 and M1 segments are normal. The anterior communicating artery is patent. The ACA and MCA branch vessels are within normal limits.  The left vertebral artery is the dominant vessel. The PICA origins are visualized and within normal limits. The basilar artery is normal. Both posterior cerebral arteries originate from the basilar tip. The PCA branch vessels are within normal limits.  IMPRESSION: 1. Acute/subacute nonhemorrhagic infarct involving the posterior right frontal lobe along the primary motor cortex. This corresponds with the patient's left hand symptoms. 2. Mild atrophy and white matter  disease otherwise likely reflects the sequela of chronic microvascular ischemia. 3. Normal variant MRA circle of Willis without evidence for significant proximal stenosis, aneurysm, or branch vessel occlusion.   Electronically Signed   By: Lawrence Santiago M.D.   On: 04/19/2013 08:14   Mr Jodene Nam Head/brain Wo Cm  04/19/2013   CLINICAL DATA:  And  EXAM: MRI HEAD WITHOUT CONTRAST  MRA HEAD WITHOUT CONTRAST  TECHNIQUE: Multiplanar, multiecho pulse sequences of the brain and surrounding structures were obtained without intravenous contrast. Angiographic images of the head were obtained using MRA technique without contrast.  COMPARISON:  CT head without contrast 04/18/2013.  FINDINGS: MRI HEAD FINDINGS  The diffusion-weighted images demonstrating nonhemorrhagic infarct involving the cortex the posterior right frontal lobe, corresponding with the patient's left hand symptoms. No hemorrhage or mass lesion is present. T2 changes are evident in the area of infarction.  Mild generalized atrophy and periventricular white matter changes are seen bilaterally. Mild mucosal thickening is present in the right maxillary sinus. There some mucosal thickening in the anterior ethmoid air cells as well.  Flow is present in the major intracranial arteries. The patient is status post bilateral lens extractions. The globes and orbits are otherwise intact.  MRA HEAD FINDINGS  The internal carotid arteries are within normal limits from the high cervical segments through the ICA termini bilaterally. The A1 and M1 segments are normal. The anterior communicating artery is patent. The ACA and MCA branch vessels are within normal limits.  The left vertebral artery is the dominant vessel. The PICA origins are visualized and within normal limits. The basilar artery is normal. Both posterior cerebral arteries originate from the basilar tip. The PCA branch vessels are within normal limits.  IMPRESSION: 1. Acute/subacute nonhemorrhagic infarct involving  the posterior right frontal lobe along the primary motor cortex. This corresponds with the patient's left hand symptoms. 2. Mild atrophy and white matter disease otherwise likely reflects the sequela of chronic microvascular ischemia. 3. Normal variant MRA circle of Willis without evidence for significant proximal stenosis, aneurysm, or branch vessel occlusion.   Electronically Signed   By: Lawrence Santiago M.D.   On: 04/19/2013 08:14    Microbiology: No results found for this or any previous visit (from the past 240 hour(s)).   Labs: Basic Metabolic Panel:  Recent Labs Lab 04/18/13 1105 04/18/13 1945  NA 139  --   K 4.6  --   CL 101  --  CO2 26  --   GLUCOSE 119*  --   BUN 14  --   CREATININE 1.01 1.04  CALCIUM 9.3  --    Liver Function Tests: No results found for this basename: AST, ALT, ALKPHOS, BILITOT, PROT, ALBUMIN,  in the last 168 hours No results found for this basename: LIPASE, AMYLASE,  in the last 168 hours No results found for this basename: AMMONIA,  in the last 168 hours CBC:  Recent Labs Lab 04/18/13 1105 04/18/13 1945  WBC 7.5 7.0  NEUTROABS 4.5  --   HGB 14.1 13.5  HCT 40.0 39.1  MCV 88.9 89.1  PLT 153 150   Cardiac Enzymes: No results found for this basename: CKTOTAL, CKMB, CKMBINDEX, TROPONINI,  in the last 168 hours BNP: BNP (last 3 results) No results found for this basename: PROBNP,  in the last 8760 hours CBG: No results found for this basename: GLUCAP,  in the last 168 hours     Signed:  [facility] MD [facility] 04/19/2013, 2:52 PM

## 2013-04-19 NOTE — Progress Notes (Signed)
Talked to patient with spouse present about Outpatient rehab; patient currently goes to the [facility] for physical therapy and is agreeable to [facility]; Clinical information faxed as ordered; CM called [facility] to inform them that patient will be going to Neuro rehab due to change in condition ( CVA).

## 2013-04-19 NOTE — Evaluation (Signed)
Speech Language Pathology Evaluation Patient Details Name: [name] [name] MRN: [id number] DOB: August 29, 1929 Today's Date: 04/19/2013 Time: 1350-1405 SLP Time Calculation (min): 15 min  Problem List:  Patient Active Problem List   Diagnosis Date Noted  . CVA (cerebral infarction) 04/18/2013  . Other and unspecified hyperlipidemia 04/18/2013  . Carotid stenosis 04/18/2013  . Abdominal aneurysm without mention of rupture 04/18/2012  . Occlusion and stenosis of carotid artery without mention of cerebral infarction 10/12/2011   Past Medical History:  Past Medical History  Diagnosis Date  . Allergy   . Hyperlipidemia   . Prostate cancer   . TIA (transient ischemic attack)   . Diplopia   . Carotid artery stenosis   . AAA (abdominal aortic aneurysm)    Past Surgical History:  Past Surgical History  Procedure Laterality Date  . Foot surgery       left Achilles Tendon Repair  . Eye surgery      Bilateral cataract   . Carotid endarterectomy  Sept. 2008    Elective left carotid endarterectomy   HPI:  80 year old man with past medical history significant for TIA,AAA, bilateral carotid artery stenosis for which he underwent a left-sided carotid endarterectomy with Dr. Kellie Simmering a while back, also has a history of hyperlipidemia. He states that today at around 9:00 am he noticed difficulty picking things up with his left hand. He got concerned when he couldn't adequately grip his golf club. He did not have any slurred speech, headache, double vision, language disturbance. He states that since being in the hospital his left arm is much improved however not back to his baseline. Incidentally, he mentions that about 3 days ago he had another transient episode of numbness of his left hand where he noticed he couldn't pick up a glass normally but did not tell his wife and did not seek medical attention at that time. MRI revealed Acute/subacute nonhemorrhagic infarct involving the posterior right frontal  lobe along the primary motor cortex.   Assessment / Plan / Recommendation Clinical Impression  Pt. seen for basic speech-language-cognitive assessment.  Speech and expressive/receptive language abilities were within functional limits.  There were no apparent deficits with basic level information.  Wife reports recent observation of pt. experiencing mild difficulty with problem solving abilities (although pt. denies/not aware).  Educated pt. and wife that outpatient ST would be recommended if he experiences difficulty performing activities once home and higher level cognitive abilitites/executive functions can be fully assessed.      SLP Assessment  Patient does not need any further Speech Lanaguage Pathology Services    Follow Up Recommendations   (outpatient if difficulty once back to typical ADLs')    Frequency and Duration        Pertinent Vitals/Pain WDL       SLP Evaluation Prior Functioning  Cognitive/Linguistic Baseline:  (wife reports minimal deficits with problem solving) Type of Home: House  Lives With: Spouse Available Help at Discharge: Family;Available 24 hours/day Vocation: Retired   Associate Professor  Overall Cognitive Status: Within Functional Limits for tasks assessed Orientation Level: Oriented X4;Oriented to person;Oriented to place;Oriented to time;Oriented to situation    Comprehension  Auditory Comprehension Overall Auditory Comprehension: Appears within functional limits for tasks assessed Visual Recognition/Discrimination Discrimination: Not tested Reading Comprehension Reading Status: Not tested    Expression Expression Primary Mode of Expression: Verbal Verbal Expression Overall Verbal Expression: Appears within functional limits for tasks assessed Written Expression Written Expression: Not tested   Oral / Motor Oral Motor/Sensory Function Overall  Oral Motor/Sensory Function: Appears within functional limits for tasks assessed Motor Speech Overall  Motor Speech: Appears within functional limits for tasks assessed   GO     [name] [name] M.Ed [employer] [phone]  04/19/2013

## 2013-04-19 NOTE — Evaluation (Signed)
SLP Cancellation Note  Patient Details Name: Maximo Spratling MRN: [id number] DOB: 10-24-1929   Cancelled treatment:       Reason Eval/Treat Not Completed: Patient at procedure or test/unavailable   Luanna Salk, [place] SLP [phone]

## 2013-04-19 NOTE — Evaluation (Signed)
Physical Therapy Evaluation Patient Details Name: [name] [name] MRN: [id number] DOB: 1929-12-18 Today's Date: 04/19/2013 Time: 7628-3151 PT Time Calculation (min): 20 min  PT Assessment / Plan / Recommendation History of Present Illness  pt presents with L Hand weakness.    Clinical Impression  Pt independent with all mobility.  Attempted DGI, however cut short due to pt need for bathroom.  Contacted OT to make aware that pt's only deficits are with L hand.  No further acute PT needs at this time.  Will sign off.       Patent does not need any further PT services    Follow Up Recommendations  No PT follow up    Does the patient have the potential to tolerate intense rehabilitation      Barriers to Discharge        Equipment Recommendations  None recommended by PT    Recommendations for Other Services OT consult   Frequency      Precautions / Restrictions Precautions Precautions: None Restrictions Weight Bearing Restrictions: No   Pertinent Vitals/Pain Denied pain.        Mobility  Bed Mobility Overal bed mobility: Independent Transfers Overall transfer level: Independent Ambulation/Gait Ambulation/Gait assistance: Independent Ambulation Distance (Feet): 300 Feet Gait Pattern/deviations: WFL(Within Functional Limits) Stairs: Yes Stairs assistance: Independent Stair Management: No rails;Forwards;Alternating pattern Number of Stairs: 5 Modified Rankin (Stroke Patients Only) Pre-Morbid Rankin Score: No symptoms Modified Rankin: No significant disability    Exercises     PT Diagnosis:    PT Problem List:   PT Treatment Interventions:       PT Goals(Current goals can be found in the care plan section)    Visit Information  Last PT Received On: 04/19/13 Assistance Needed: +1 History of Present Illness: pt presents with L Hand weakness.         Prior [place] expects to be discharged to:: Private residence Living  Arrangements: Spouse/significant other Available Help at Discharge: Family;Available 24 hours/day Type of Home: House Home Access: Level entry Home Layout: One level Home Equipment: None Prior Function Level of Independence: Independent Communication Communication: No difficulties    Cognition  Cognition Arousal/Alertness: Awake/alert Behavior During Therapy: WFL for tasks assessed/performed Overall Cognitive Status: Within Functional Limits for tasks assessed    Extremity/Trunk Assessment Upper Extremity Assessment Upper Extremity Assessment: Defer to OT evaluation Lower Extremity Assessment Lower Extremity Assessment: Overall WFL for tasks assessed   Balance Balance Overall balance assessment: Independent Standardized Balance Assessment Standardized Balance Assessment : Dynamic Gait Index Dynamic Gait Index Level Surface: Normal Change in Gait Speed: Normal Gait with Horizontal Head Turns: Normal Gait with Vertical Head Turns: Normal Step Around Obstacles: Normal Steps: Normal  End of Session PT - End of Session Activity Tolerance: Patient tolerated treatment well Patient left: with family/visitor present (in bathroom) Nurse Communication: Mobility status  GP     [place], [name] 04/19/2013, 8:47 AM

## 2013-04-19 NOTE — Plan of Care (Signed)
D/c instructions given to pt. V/u. Med scripts given to pt. Pt is stable to be d/c.

## 2013-04-19 NOTE — Consult Note (Signed)
[name] [name] Student Nurse NCAT/ Sonja Wilson EdD 

## 2013-04-19 NOTE — Progress Notes (Signed)
Stroke Team Progress Note  HISTORY [name] [name] is an 80 y.o. male, right handed, with a past medical history significant for hyperlipidemia, s/p left CEA, known right carotid stenosis follow by vascular surgery, TIA, AAA, prostate cancer, brought in by EMS due to acute onset left arm weakness earlier this morning.  He was last known well around 9 am this morning 04/18/2013 when he noticed that he was having difficulty picking things up with his left hand. He said that he couldn't pick and lift up a golf club. No associated numbness, HA, vertigo, double vision, slurred speech, confusion, language or vision disturbance, chest pain, or palpitations. He said that his left arm is " much better but still doesn't feel completely normal. Takes aspirin 81 mg daily but it was held x 1 week due to sinus surgery. Of note, he said that 3 days ago he had a transient episode of " numbness of my left hand, I couldn't pick up a glass as I normally do". NIHSS 1 (mild left dysmetria). CT brain showed a small area of low attenuation in the anterior limb of the right internal capsule, which could indicate a subacute lacunar infarction.   Patient was not administerd TPA secondary to delay in arrival, mild symptoms. He was admitted for further evaluation and treatment.  SUBJECTIVE The patient's wife is present this morning. The patient is without complaints. Apparently Dr. Kellie Simmering came by this morning to schedule the patient for a right carotid endarterectomy next Friday..  OBJECTIVE Most recent Vital Signs: Filed Vitals:   04/19/13 0544 04/19/13 0821 04/19/13 1044 04/19/13 1331  BP: 135/69 145/84 128/83 150/82  Pulse:  68 69 88  Temp: 97.7 F (36.5 C) 97.2 F (36.2 C) 97.4 F (36.3 C) 97.7 F (36.5 C)  TempSrc: Oral Oral Oral Oral  Resp: 18 18 18 18   Height:      Weight:      SpO2: 95% 93% 93% 94%   CBG (last 3)  No results found for this basename: GLUCAP,  in the last 72 hours  IV Fluid Intake:      MEDICATIONS  . aspirin EC  81 mg Oral q morning - 10a  . atorvastatin  10 mg Oral q1800  . cephALEXin  500 mg Oral Q12H  . clopidogrel  75 mg Oral Q breakfast  . enoxaparin (LOVENOX) injection  40 mg Subcutaneous Q24H  . loratadine  10 mg Oral Daily  . omega-3 acid ethyl esters  1 g Oral Daily   PRN:  senna-docusate  Diet:  Cardiac thin liquids Activity:  OOB with assistance DVT Prophylaxis:  Lovenox 40 mg sq daily   CLINICALLY SIGNIFICANT STUDIES Basic Metabolic Panel:  Recent Labs Lab 04/18/13 1105 04/18/13 1945  NA 139  --   K 4.6  --   CL 101  --   CO2 26  --   GLUCOSE 119*  --   BUN 14  --   CREATININE 1.01 1.04  CALCIUM 9.3  --    Liver Function Tests: No results found for this basename: AST, ALT, ALKPHOS, BILITOT, PROT, ALBUMIN,  in the last 168 hours CBC:  Recent Labs Lab 04/18/13 1105 04/18/13 1945  WBC 7.5 7.0  NEUTROABS 4.5  --   HGB 14.1 13.5  HCT 40.0 39.1  MCV 88.9 89.1  PLT 153 150   Coagulation: No results found for this basename: LABPROT, INR,  in the last 168 hours Cardiac Enzymes: No results found for this basename: CKTOTAL, CKMB, CKMBINDEX,  TROPONINI,  in the last 168 hours Urinalysis: No results found for this basename: COLORURINE, APPERANCEUR, LABSPEC, [place], GLUCOSEU, HGBUR, BILIRUBINUR, KETONESUR, PROTEINUR, UROBILINOGEN, NITRITE, LEUKOCYTESUR,  in the last 168 hours Lipid Panel    Component Value Date/Time   CHOL 117 04/19/2013 0520   TRIG 80 04/19/2013 0520   HDL 39* 04/19/2013 0520   CHOLHDL 3.0 04/19/2013 0520   VLDL 16 04/19/2013 0520   LDLCALC 62 04/19/2013 0520   HgbA1C  Lab Results  Component Value Date   HGBA1C 5.9* 04/19/2013    Urine Drug Screen:   No results found for this basename: labopia, cocainscrnur, labbenz, amphetmu, thcu, labbarb    Alcohol Level: No results found for this basename: ETH,  in the last 168 hours    CT of the brain  04/18/2013    1. Subtle area of low attenuation in the anterior limb of the  right internal capsule, which could indicate a subacute lacunar infarction. This could be further evaluated with an MRI of the brain if clinically appropriate. 2. Small left mastoid effusion.   MRI of the brain  04/19/2013   1. Acute/subacute nonhemorrhagic infarct involving the posterior right frontal lobe along the primary motor cortex. This corresponds with the patient's left hand symptoms. 2. Mild atrophy and white matter disease otherwise likely reflects the sequela of chronic microvascular ischemia.   MRA of the brain  04/19/2013    Normal variant MRA circle of Willis without evidence for significant proximal stenosis, aneurysm, or branch vessel occlusion.     2D Echocardiogram  04/18/2013 EF 45-50% with no source of embolus.   Carotid Doppler  Finding suggest 80-99% right internal carotid artery stenosis, and 1-39% left internal carotid artery stenosis. Vertebral arteries are patent with antegrade flow. When compared with the prior study from 10/17/2012, the right ICA stenosis has increased.   CXR  04/18/2013   Diffusely increased interstitial markings greater on the left likely reflect interval development of pulmonary fibrosis. Acute pneumonitis could produce similar findings but given the patient's asymptomatic state, this is felt to be unlikely.     EKG    Therapy Recommendations no PT  Physical Exam  Awake alert. Afebrile. Head is nontraumatic. Neck is supple without bruit. Hearing is normal. Cardiac exam no murmur or gallop. Lungs are clear to auscultation. Distal pulses are well felt. Neurological Exam : Awake alert oriented x 3 normal speech and language. Mild left lower face asymmetry. Tongue midline. No drift. Mild diminished fine finger movements on left. Orbits right over left upper extremity. Mild left grip weak.. Normal sensation . Normal coordination. ASSESSMENT [name] [name] is a 80 y.o. male presenting with left am weakness. Imaging confirms a right posterior frontal  infarct. Infarct felt to be  secondary to right carotid stenosis. On aspirin 81 mg orally every day prior to admission. Now on clopidogrel 75 mg orally every day for secondary stroke prevention. Patient with resultant mild left hand weakness. Work up underway.   Right ICA stenosis 80-99% scheduled for Rt CEA next week with Dr Kellie Simmering Hyperlipidemia, LDL 62, on crestor 5 mg daily PTA, now on lipitor 10 mg daily, at goal LDL < 100 (< 70 for diabetics) Hx TIA AAA Family hx stroke (mother, father) [facility] day # 1  TREATMENT/PLAN  Continue clopidogrel 75 mg orally every day for secondary stroke prevention. The patient's wife asked if the patient could take aspirin along with the Plavix. Dr. Leonie Man recommended that they check with Dr. Kellie Simmering  regarding the addition of aspirin. From Dr. Leonie Man standpoint it would be acceptable.  Probable discharge today  Followup with Dr. Leonie Man in the office.  Mikey Bussing PA-C [facility] Pager [phone] 04/19/2013, 3:18 PM I have personally examined this patient , reviewed data, and developed plan of care and agree  with above Antony Contras, MD

## 2013-04-19 NOTE — Progress Notes (Signed)
Occupational Therapy Treatment Patient Details Name: Depaul Arizpe MRN: [id number] DOB: 1929/10/01 Today's Date: 04/19/2013 Time: 2902-1115 OT Time Calculation (min): 31 min  OT Assessment / Plan / Recommendation  History of present illness R frontal CVA.   OT comments  Completed education regarding HEP for L UE strengthening/fine/gross motor coordination. Given written HEP/theraputty.Rec pt follow up with OT at [facility] for continues therapy to increase functional use LUE. Pt/wife in agreement.   Follow Up Recommendations  Outpatient OT;Supervision - Intermittent    Barriers to Discharge       Equipment Recommendations  None recommended by OT    Recommendations for Other Services    Frequency Min 2X/week   Progress towards OT Goals Progress towards OT goals: Goals met/education completed, patient discharged from OT (all further OT to be addressed by [facility])  Plan Discharge plan remains appropriate    Precautions / Restrictions Precautions Precautions: None Restrictions Weight Bearing Restrictions: No   Pertinent Vitals/Pain no apparent distress     ADL  Transfers/Ambulation Related to ADLs: mod I ADL Comments: Pt overall Mod i with ADL    OT Diagnosis: Generalized weakness;Ataxia  OT Problem List: Decreased strength;Decreased coordination;Impaired UE functional use OT Treatment Interventions: Therapeutic exercise;Neuromuscular education;Therapeutic activities;Patient/family education   OT Goals(current goals can now be found in the care plan section) Acute Rehab OT Goals Patient Stated Goal: to play golf OT Goal Formulation: With patient Time For Goal Achievement: 04/26/13 Potential to Achieve Goals: Good  Visit Information  Last OT Received On: 04/19/13 Assistance Needed: +1 History of Present Illness: pt presents with L Hand weakness.      Subjective Data      Prior Functioning  Home Living Family/patient expects to be discharged to:: Private  residence Living Arrangements: Spouse/significant other Available Help at Discharge: Family;Available 24 hours/day Type of Home: House Home Access: Level entry Home Layout: One level Home Equipment: None  Lives With: Spouse Prior Function Level of Independence: Independent Communication Communication: No difficulties    Cognition  Cognition Arousal/Alertness: Awake/alert Behavior During Therapy: WFL for tasks assessed/performed Overall Cognitive Status:  (wife states husband appears to have more difficulty with pro)    Mobility  Bed Mobility Overal bed mobility: Independent Transfers Overall transfer level: Independent    Exercises  Other Exercises Other Exercises: Educated pt/wife on HEP for fine/gross motor coordination. also educated on BUE integrated activities. Other Exercises: given theraputty/educated in exercises for strenthening and coordination Other Exercises: Educated pt/family on home safety   Balance   End of Session OT - End of Session Activity Tolerance: Patient tolerated treatment well Patient left: in bed;with call bell/phone within reach;with family/visitor present Nurse Communication: Mobility status  GO     Mang Hazelrigg,HILLARY 04/19/2013, 3:40 PM [facility], OTR/L  [phone] 04/19/2013

## 2013-04-19 NOTE — Progress Notes (Signed)
Occupational Therapy Evaluation Patient Details Name: Drayton Tieu MRN: [id number] DOB: [birth year] Today's Date: 04/19/2013 Time: 4431-5400 OT Time Calculation (min): 23 min  OT Assessment / Plan / Recommendation History of present illness R frontal CVA   Clinical Impression   PTA, pt independent with all ADL and mobility. Pt with decline in functional use of L hand. Pt will need to follow up with OT at the neuro outpt center. Will see again prior to D/C to establish HEP as pt is expecting to undergo surgery and is unsure when he will begin OT. Discussed/Educated pt/wife on warning/signins/synptoms of CVA/    OT Assessment  Patient needs continued OT Services    Follow Up Recommendations  Outpatient OT;Supervision - Intermittent (neuro)    Barriers to Discharge      Equipment Recommendations  None recommended by OT    Recommendations for Other Services    Frequency  Min 2X/week    Precautions / Restrictions Precautions Precautions: None Restrictions Weight Bearing Restrictions: No   Pertinent Vitals/Pain no apparent distress     ADL  Transfers/Ambulation Related to ADLs: mod I ADL Comments: Pt overall Mod i with ADL    OT Diagnosis: Generalized weakness;Ataxia  OT Problem List: Decreased strength;Decreased coordination;Impaired UE functional use OT Treatment Interventions: Therapeutic exercise;Neuromuscular education;Therapeutic activities;Patient/family education   OT Goals(Current goals can be found in the care plan section) Acute Rehab OT Goals Patient Stated Goal: to play golf OT Goal Formulation: With patient Time For Goal Achievement: 04/26/13 Potential to Achieve Goals: Good  Visit Information  Last OT Received On: 04/19/13 Assistance Needed: +1 History of Present Illness: pt presents with L Hand weakness.         Prior [place] expects to be discharged to:: Private residence Living Arrangements:  Spouse/significant other Available Help at Discharge: Family;Available 24 hours/day Type of Home: House Home Access: Level entry Home Layout: One level Home Equipment: None  Lives With: Spouse Prior Function Level of Independence: Independent Communication Communication: No difficulties         Vision/Perception Vision - History Baseline Vision: Wears glasses only for reading Patient Visual Report: No change from baseline Vision - Assessment Eye Alignment: Within Functional Limits Additional Comments: WFL Perception Perception: Within Functional Limits Praxis Praxis: Impaired Praxis Impairment Details: Motor planning (mild motor planning deficits with LUE)   Cognition  Cognition Arousal/Alertness: Awake/alert Behavior During Therapy: WFL for tasks assessed/performed Overall Cognitive Status:  (wife states husband appears to have more difficulty with pro)    Extremity/Trunk Assessment Upper Extremity Assessment Upper Extremity Assessment: Overall WFL for tasks assessed LUE Deficits / Details: Pt demonstrates genralized weakness. Isolated joint movement observed. Deficits with coordination. L limb ataxia. LUE Coordination: decreased fine motor;decreased gross motor Lower Extremity Assessment Lower Extremity Assessment: Overall WFL for tasks assessed Cervical / Trunk Assessment Cervical / Trunk Assessment: Normal     Mobility Bed Mobility Overal bed mobility: Independent Transfers Overall transfer level: Independent     Exercise     [facility], OTR/L  [phone] 1/29/2015Balance Balance Overall balance assessment: Independent Standardized Balance Assessment Standardized Balance Assessment : Dynamic Gait Index Dynamic Gait Index Level Surface: Normal Change in Gait Speed: Normal Gait with Horizontal Head Turns: Normal Gait with Vertical Head Turns: Normal Step Around Obstacles: Normal Steps: Normal   End of Session OT - End of Session Activity Tolerance:  Patient tolerated treatment well Patient left: in bed;with call bell/phone within reach;with family/visitor present Nurse Communication: Mobility status  GO     Emiliana Blaize,HILLARY 04/19/2013, 3:33 PM

## 2013-04-19 NOTE — Consult Note (Signed)
Vascular Surgery Consultation  Reason for Consult: Severe right ICA stenosis and right brain CVA  HPI: [name] [name] is a 80 y.o. male who presents for evaluation of severe right ICA stenosis and right brain CVA. Patient is well known to me having undergone left carotid endarterectomy in 2008 for severe stenosis. He was having left brain TIAs prior to the surgery and has not had any recurrent TIAs in that distribution. One week ago he had 2 episodes of transient clumsiness in the left upper tremor each lasting about 15-20 minutes and completely resolving. He denies any problems with speech, vision, balance, or lower extremity or facial asymmetry. Yesterday he developed recurrent left upper extremity clumsiness which did not resolve. Evaluation at the hospital has revealed evidence of a small subacute-acute infarct in the posterior aspect of the right frontal lobe consistent with his symptoms. He was on aspirin at the time of the event.     Past Medical History  Diagnosis Date  . Allergy   . Hyperlipidemia   . Prostate cancer   . TIA (transient ischemic attack)   . Diplopia   . Carotid artery stenosis   . AAA (abdominal aortic aneurysm)    Past Surgical History  Procedure Laterality Date  . Foot surgery       left Achilles Tendon Repair  . Eye surgery      Bilateral cataract   . Carotid endarterectomy  Sept. 2008    Elective left carotid endarterectomy   History   Social History  . Marital Status: Married    Spouse Name: N/A    Number of Children: N/A  . Years of Education: N/A   Social History Main Topics  . Smoking status: Former Smoker    Types: Cigarettes    Quit date: 03/27/1964  . Smokeless tobacco: Never Used  . Alcohol Use: Yes     Comment: occasional  . Drug Use: No  . Sexual Activity:    Other Topics Concern  . None   Social History Narrative  . None   Family History  Problem Relation Age of Onset  . Stroke Mother   . Heart disease Mother   .  Stroke Father   . Heart attack Father   . Heart disease Brother    Allergies  Allergen Reactions  . Sudafed [Pseudoephedrine Hcl]     Causes dizziness and nervousness   Prior to Admission medications   Medication Sig Start Date End Date Taking? Authorizing Provider  acetaminophen (TYLENOL) 500 MG tablet Take 1,000 mg by mouth every 4 (four) hours as needed for mild pain.   Yes Historical Provider, MD  aspirin EC 81 MG tablet Take 81 mg by mouth every morning.   Yes Historical Provider, MD  cephALEXin (KEFLEX) 500 MG capsule Take 500 mg by mouth 3 (three) times daily. 04/17/13 04/22/13 Yes Historical Provider, MD  Fexofenadine HCl (ALLEGRA PO) Take 1 tablet by mouth every morning.   Yes Historical Provider, MD  Multiple Vitamins-Minerals (CENTRUM SILVER PO) Take 1 tablet by mouth daily.    Yes Historical Provider, MD  Omega-3 Fatty Acids (FISH OIL) 1000 MG CPDR Take 1,000 mg by mouth every morning.    Yes Historical Provider, MD  rosuvastatin (CRESTOR) 5 MG tablet Take 5 mg by mouth at bedtime.    Historical Provider, MD     Positive ROS: Denies chest pain, dyspnea on exertion, PND, orthopnea, claudication, back discomfort.  All other systems have been reviewed and were otherwise negative with the  exception of those mentioned in the HPI and as above.  Physical Exam: Filed Vitals:   04/19/13 1044  BP: 128/83  Pulse: 69  Temp: 97.4 F (36.3 C)  Resp: 18    General: Alert, no acute distress HEENT: Normal for age Cardiovascular: Regular rate and rhythm. Carotid pulses 2+, no bruits audible Respiratory: Clear to auscultation. No cyanosis, no use of accessory musculature GI: No organomegaly, abdomen is soft with small pulsatile mass-nontender Skin: No lesions in the area of chief complaint Neurologic: Sensation intact distally Psychiatric: Patient is competent for consent with normal mood and affect Musculoskeletal: No obvious deformities Extremities: 2+ femoral and posterior tibial  pulses palpable bilaterally    Imaging reviewed: Carotid duplex exam performed yesterday revealed 80-90% right ICA stenosis. 6 months ago the degree of stenosis and our office was only 60%. The left carotid endarterectomy site is widely patent.    Assessment/Plan:  #1 severe right ICA stenosis with small right brain CVA by CT scan and MRI scan and the posterior aspect of the right frontal lobe-nonhemorrhagic with small deficit left upper extremity which is rapidly improving #2 known small abdominal aortic aneurysm which measured 4.5 cm January 2014  Plan elective right carotid endarterectomy in about 8 days the patient has had small CVA and right hemisphere. Discussed this with patient and his daughter in the operative risk of stroke which approximates 3% in a symptomatic patient and he would like to proceed next week. We'll also need continued followup of his small bowel aortic aneurysm which I have been following in the office. Would recommend putting the patient on Plavix 75 mg daily beginning today and will continue up until the time of surgery which is scheduled for Friday, February 6-okay to DC home today   James Lawson, MD 04/19/2013 10:47 AM      

## 2013-04-20 NOTE — ED Provider Notes (Signed)
CSN: [id number]     Arrival date & time 04/18/13  1044 History   First MD Initiated Contact with Patient 04/18/13 1057     Chief Complaint  Patient presents with  . Extremity Weakness    HPI  Patient presents with the chief complaint of weakness of his left arm. He underwent sinus surgery yesterday. This is an endoscopic procedure. He has been doing well. He felt weak with his left arm this morning was having difficulty manipulating a cup of coffee. He tried all the golf club this grip and felt like he cannot get proper use of his left hand. This has improved but he still feels that his left hand is nontender his ultimate absolute control. He has not had headache. He does have speech symptoms. He is not swallowing symptoms. He has not had lower extremity weakness or symptoms. The recent palpitations. No history of strokes. No history of atrial fibrillation. He is a known aortic aneurysm, has undergone left carotid endarterectomy. To his knowledge his most recent study of his right carotid showed 60% stenosis per he and his wife's report.  Past Medical History  Diagnosis Date  . Allergy   . Hyperlipidemia   . Prostate cancer   . TIA (transient ischemic attack)   . Diplopia   . Carotid artery stenosis   . AAA (abdominal aortic aneurysm)    Past Surgical History  Procedure Laterality Date  . Foot surgery       left Achilles Tendon Repair  . Eye surgery      Bilateral cataract   . Carotid endarterectomy  Sept. 2008    Elective left carotid endarterectomy   Family History  Problem Relation Age of Onset  . Stroke Mother   . Heart disease Mother   . Stroke Father   . Heart attack Father   . Heart disease Brother    History  Substance Use Topics  . Smoking status: Former Smoker    Types: Cigarettes    Quit date: 03/27/1964  . Smokeless tobacco: Never Used  . Alcohol Use: Yes     Comment: occasional    Review of Systems  Constitutional: Negative for fever, chills, diaphoresis,  appetite change and fatigue.  HENT: Negative for mouth sores, sore throat and trouble swallowing.   Eyes: Negative for visual disturbance.  Respiratory: Negative for cough, chest tightness, shortness of breath and wheezing.   Cardiovascular: Negative for chest pain.  Gastrointestinal: Negative for nausea, vomiting, abdominal pain, diarrhea and abdominal distention.  Endocrine: Negative for polydipsia, polyphagia and polyuria.  Genitourinary: Negative for dysuria, frequency and hematuria.  Musculoskeletal: Negative for gait problem.  Skin: Negative for color change, pallor and rash.  Neurological: Negative for dizziness, syncope, light-headedness and headaches.       Scribe his left upper extremity weakness and clumsiness to  Hematological: Does not bruise/bleed easily.  Psychiatric/Behavioral: Negative for behavioral problems and confusion.    Allergies  Sudafed  Home Medications   Current Outpatient Rx  Name  Route  Sig  Dispense  Refill  . acetaminophen (TYLENOL) 500 MG tablet   Oral   Take 1,000 mg by mouth every 4 (four) hours as needed for mild pain.         Marland Kitchen aspirin EC 81 MG tablet   Oral   Take 81 mg by mouth every morning.         . cephALEXin (KEFLEX) 500 MG capsule   Oral   Take 500 mg by mouth 3 (  three) times daily.         Marland Kitchen Fexofenadine HCl (ALLEGRA PO)   Oral   Take 1 tablet by mouth every morning.         . Multiple Vitamins-Minerals (CENTRUM SILVER PO)   Oral   Take 1 tablet by mouth daily.          . Omega-3 Fatty Acids (FISH OIL) 1000 MG CPDR   Oral   Take 1,000 mg by mouth every morning.          . clopidogrel (PLAVIX) 75 MG tablet   Oral   Take 1 tablet (75 mg total) by mouth daily with breakfast.   31 tablet   0   . rosuvastatin (CRESTOR) 5 MG tablet   Oral   Take 5 mg by mouth at bedtime.          BP 150/82  Pulse 88  Temp(Src) 97.7 F (36.5 C) (Oral)  Resp 18  Ht 5\' 10"  (1.778 m)  Wt 183 lb (83.008 kg)  BMI 26.26  kg/m2  SpO2 94% Physical Exam  Constitutional: He is oriented to person, place, and time. He appears well-developed and well-nourished. No distress.  HENT:  Head: Normocephalic.  Eyes: Conjunctivae are normal. Pupils are equal, round, and reactive to light. No scleral icterus.  Neck: Normal range of motion. Neck supple. No thyromegaly present.  Cardiovascular: Normal rate and regular rhythm.  Exam reveals no gallop and no friction rub.   No murmur heard. Pulmonary/Chest: Effort normal and breath sounds normal. No respiratory distress. He has no wheezes. He has no rales.  Abdominal: Soft. Bowel sounds are normal. He exhibits no distension. There is no tenderness. There is no rebound.  Musculoskeletal: Normal range of motion.  Neurological: He is alert and oriented to person, place, and time.  He has no facial droop. Cranial nerves are intact symmetric. His level of consciousness is normal he is alert attentive. No aphasia.  Normal symmetric Strength to shoulder shrug, triceps, biceps, grip,wrist flex/extend,and intrinsics  Norma lsymmetric sensation above and below clavicles, and to all distributions to UEs. Norma symmetric strength to flex/.extend hip and knees, dorsi/plantar flex ankles. Normal symmetric sensation to all distributions to LEs Patellar and achilles reflexes 1-2+. Downgoing Babinski  In particular no pronator drift the left upper extremity on initial repeat exam  Skin: Skin is warm and dry. No rash noted.  Psychiatric: He has a normal mood and affect. His behavior is normal.    ED Course  Procedures (including critical care time) Labs Review Labs Reviewed  BASIC METABOLIC PANEL - Abnormal; Notable for the following:    Glucose, Bld 119 (*)    GFR calc non Af Amer 68 (*)    GFR calc Af Amer 78 (*)    All other components within normal limits  HEMOGLOBIN A1C - Abnormal; Notable for the following:    Hemoglobin A1C 5.8 (*)    Mean Plasma Glucose 120 (*)    All other  components within normal limits  HEMOGLOBIN A1C - Abnormal; Notable for the following:    Hemoglobin A1C 5.9 (*)    Mean Plasma Glucose 123 (*)    All other components within normal limits  LIPID PANEL - Abnormal; Notable for the following:    HDL 39 (*)    All other components within normal limits  CREATININE, SERUM - Abnormal; Notable for the following:    GFR calc non Af Amer 65 (*)    GFR calc Af  Amer 76 (*)    All other components within normal limits  CBC WITH DIFFERENTIAL  LIPID PANEL  CBC  POCT I-STAT TROPONIN I   Imaging Review Dg Chest 2 View  04/18/2013   CLINICAL DATA:  History of CVA and peripheral vascular disease and abdominal aortic aneurysm.  EXAM: CHEST  2 VIEW  COMPARISON:  Insert previous  FINDINGS: Since the previous study there have developed increased interstitial markings diffusely most conspicuously on the left. There is no alveolar infiltrate. The cardiac silhouette is not enlarged. The pulmonary vascularity is not engorged. There is no pleural effusion or pneumothorax. The mediastinum is normal in width. There is mild tortuosity of the descending thoracic aorta. The observed portions of the bony thorax exhibit no acute abnormalities.  IMPRESSION: Diffusely increased interstitial markings greater on the left likely reflect interval development of pulmonary fibrosis. Acute pneumonitis could produce similar findings but given the patient's asymptomatic state, this is felt to be unlikely.   Electronically Signed   By: David  Martinique   On: 04/18/2013 21:55   Ct Head Wo Contrast  04/18/2013   CLINICAL DATA:  Left-sided arm weakness.  Dizziness.  EXAM: CT HEAD WITHOUT CONTRAST  TECHNIQUE: Contiguous axial images were obtained from the base of the skull through the vertex without intravenous contrast.  COMPARISON:  No priors.  FINDINGS: Images 13 and 14 of series 2 demonstrate a subtle area of low attenuation in the anterior limb of the right internal capsule, which could  represent a subacute lacunar infarction. No other potential acute intracranial abnormalities. Specifically, no evidence of acute intracranial hemorrhage, no mass, mass effect, hydrocephalus or abnormal intra or extra-axial fluid collections. Visualized paranasal sinuses and mastoids are well pneumatized, with exception of a small left mastoid effusion. Postoperative changes of bilateral maxillary antrectomy are noted. No acute displaced skull fractures are identified.  IMPRESSION: 1. Subtle area of low attenuation in the anterior limb of the right internal capsule, which could indicate a subacute lacunar infarction. This could be further evaluated with an MRI of the brain if clinically appropriate. 2. Small left mastoid effusion. These results were called by telephone at the time of interpretation on 04/18/2013 at 12:03 PM to Dr. Tanna Furry, who verbally acknowledged these results.   Electronically Signed   By: Vinnie Langton M.D.   On: 04/18/2013 12:05   Mr Brain Wo Contrast  04/19/2013   CLINICAL DATA:  And  EXAM: MRI HEAD WITHOUT CONTRAST  MRA HEAD WITHOUT CONTRAST  TECHNIQUE: Multiplanar, multiecho pulse sequences of the brain and surrounding structures were obtained without intravenous contrast. Angiographic images of the head were obtained using MRA technique without contrast.  COMPARISON:  CT head without contrast 04/18/2013.  FINDINGS: MRI HEAD FINDINGS  The diffusion-weighted images demonstrating nonhemorrhagic infarct involving the cortex the posterior right frontal lobe, corresponding with the patient's left hand symptoms. No hemorrhage or mass lesion is present. T2 changes are evident in the area of infarction.  Mild generalized atrophy and periventricular white matter changes are seen bilaterally. Mild mucosal thickening is present in the right maxillary sinus. There some mucosal thickening in the anterior ethmoid air cells as well.  Flow is present in the major intracranial arteries. The patient is  status post bilateral lens extractions. The globes and orbits are otherwise intact.  MRA HEAD FINDINGS  The internal carotid arteries are within normal limits from the high cervical segments through the ICA termini bilaterally. The A1 and M1 segments are normal. The anterior communicating artery  is patent. The ACA and MCA branch vessels are within normal limits.  The left vertebral artery is the dominant vessel. The PICA origins are visualized and within normal limits. The basilar artery is normal. Both posterior cerebral arteries originate from the basilar tip. The PCA branch vessels are within normal limits.  IMPRESSION: 1. Acute/subacute nonhemorrhagic infarct involving the posterior right frontal lobe along the primary motor cortex. This corresponds with the patient's left hand symptoms. 2. Mild atrophy and white matter disease otherwise likely reflects the sequela of chronic microvascular ischemia. 3. Normal variant MRA circle of Willis without evidence for significant proximal stenosis, aneurysm, or branch vessel occlusion.   Electronically Signed   By: Lawrence Santiago M.D.   On: 04/19/2013 08:14   Mr Jodene Nam Head/brain Wo Cm  04/19/2013   CLINICAL DATA:  And  EXAM: MRI HEAD WITHOUT CONTRAST  MRA HEAD WITHOUT CONTRAST  TECHNIQUE: Multiplanar, multiecho pulse sequences of the brain and surrounding structures were obtained without intravenous contrast. Angiographic images of the head were obtained using MRA technique without contrast.  COMPARISON:  CT head without contrast 04/18/2013.  FINDINGS: MRI HEAD FINDINGS  The diffusion-weighted images demonstrating nonhemorrhagic infarct involving the cortex the posterior right frontal lobe, corresponding with the patient's left hand symptoms. No hemorrhage or mass lesion is present. T2 changes are evident in the area of infarction.  Mild generalized atrophy and periventricular white matter changes are seen bilaterally. Mild mucosal thickening is present in the right  maxillary sinus. There some mucosal thickening in the anterior ethmoid air cells as well.  Flow is present in the major intracranial arteries. The patient is status post bilateral lens extractions. The globes and orbits are otherwise intact.  MRA HEAD FINDINGS  The internal carotid arteries are within normal limits from the high cervical segments through the ICA termini bilaterally. The A1 and M1 segments are normal. The anterior communicating artery is patent. The ACA and MCA branch vessels are within normal limits.  The left vertebral artery is the dominant vessel. The PICA origins are visualized and within normal limits. The basilar artery is normal. Both posterior cerebral arteries originate from the basilar tip. The PCA branch vessels are within normal limits.  IMPRESSION: 1. Acute/subacute nonhemorrhagic infarct involving the posterior right frontal lobe along the primary motor cortex. This corresponds with the patient's left hand symptoms. 2. Mild atrophy and white matter disease otherwise likely reflects the sequela of chronic microvascular ischemia. 3. Normal variant MRA circle of Willis without evidence for significant proximal stenosis, aneurysm, or branch vessel occlusion.   Electronically Signed   By: Lawrence Santiago M.D.   On: 04/19/2013 08:14      MDM   1. CVA (cerebral infarction)   2. Carotid stenosis   3. Other and unspecified hyperlipidemia   4. Occlusion and stenosis of carotid artery with cerebral infarction    Patient seen upon arrival by myself, and with Dr. Aram Beecham with neurology. CT scan ultimately shows a right internal capsule lacunar type infarct. This would explain his left upper extremities symptoms. He has a normal exam. He has a subjective feeling of weakness of the left arm but is not demonstrable on exam. He will be admitted for continued evaluation with echocardiogram, carotid evaluation, an MRI. He has been given aspirin today.   Tanna Furry, MD 04/20/13 1105

## 2013-04-25 ENCOUNTER — Encounter (HOSPITAL_COMMUNITY): Payer: Self-pay

## 2013-04-25 ENCOUNTER — Encounter (HOSPITAL_COMMUNITY)
Admission: RE | Admit: 2013-04-25 | Discharge: 2013-04-25 | Disposition: A | Discharge status: Home / Self Care | Payer: Medicare Other | Source: Ambulatory Visit | Attending: Vascular Surgery | Admitting: Vascular Surgery

## 2013-04-25 HISTORY — DX: Cerebral infarction, unspecified: I63.9

## 2013-04-25 LAB — COMPREHENSIVE METABOLIC PANEL
ALBUMIN: 3.9 g/dL (ref 3.5–5.2)
ALK PHOS: 55 U/L (ref 39–117)
ALT: 20 U/L (ref 0–53)
AST: 31 U/L (ref 0–37)
BUN: 17 mg/dL (ref 6–23)
CALCIUM: 9.4 mg/dL (ref 8.4–10.5)
CO2: 24 mEq/L (ref 19–32)
Chloride: 100 mEq/L (ref 96–112)
Creatinine, Ser: 1.01 mg/dL (ref 0.50–1.35)
GFR calc non Af Amer: 68 mL/min — ABNORMAL LOW (ref >90)
GFR, EST AFRICAN AMERICAN: 78 mL/min — AB (ref >90)
Glucose, Bld: 126 mg/dL — ABNORMAL HIGH (ref 70–99)
POTASSIUM: 4.8 meq/L (ref 3.7–5.3)
Sodium: 137 mEq/L (ref 137–147)
TOTAL PROTEIN: 8.3 g/dL (ref 6.0–8.3)
Total Bilirubin: 0.8 mg/dL (ref 0.3–1.2)

## 2013-04-25 LAB — URINALYSIS, ROUTINE W REFLEX MICROSCOPIC
BILIRUBIN URINE: NEGATIVE
GLUCOSE, UA: NEGATIVE mg/dL
Hgb urine dipstick: NEGATIVE
KETONES UR: NEGATIVE mg/dL
Leukocytes, UA: NEGATIVE
Nitrite: NEGATIVE
Protein, ur: NEGATIVE mg/dL
Specific Gravity, Urine: 1.021 (ref 1.005–1.030)
Urobilinogen, UA: 0.2 mg/dL (ref 0.0–1.0)
pH: 6 (ref 5.0–8.0)

## 2013-04-25 LAB — SURGICAL PCR SCREEN
MRSA, PCR: NEGATIVE
Staphylococcus aureus: NEGATIVE

## 2013-04-25 LAB — APTT: aPTT: 32 seconds (ref 24–37)

## 2013-04-25 LAB — CBC
HCT: 40.5 % (ref 39.0–52.0)
Hemoglobin: 14.2 g/dL (ref 13.0–17.0)
MCH: 31.5 pg (ref 26.0–34.0)
MCHC: 35.1 g/dL (ref 30.0–36.0)
MCV: 89.8 fL (ref 78.0–100.0)
PLATELETS: 183 10*3/uL (ref 150–400)
RBC: 4.51 MIL/uL (ref 4.22–5.81)
RDW: 14 % (ref 11.5–15.5)
WBC: 8.1 10*3/uL (ref 4.0–10.5)

## 2013-04-25 LAB — PROTIME-INR
INR: 1.13 (ref 0.00–1.49)
PROTHROMBIN TIME: 14.3 s (ref 11.6–15.2)

## 2013-04-25 LAB — PREPARE RBC (CROSSMATCH)

## 2013-04-25 NOTE — Pre-Procedure Instructions (Addendum)
[name] [name]  04/25/2013   Your procedure is scheduled on:  04/27/13  Report to [facility] short stay admitting at 530 AM.  Call this number if you have problems the morning of surgery: [phone]   Remember:   Do not eat food or drink liquids after midnight.   Take these medicines the morning of surgery with A SIP OF WATER: aspirin, plavix      STOP all herbel meds, nsaids (aleve,naproxen,advil,ibuprofen) including vitamins ,fish oil   Do not wear jewelry, make-up or nail polish.  Do not wear lotions, powders, or perfumes. You may wear deodorant.  Do not shave 48 hours prior to surgery. Men may shave face and neck.  Do not bring valuables to the hospital.  [facility] is not responsible                  for any belongings or valuables.               Contacts, dentures or bridgework may not be worn into surgery.  Leave suitcase in the car. After surgery it may be brought to your room.  For patients admitted to the hospital, discharge time is determined by your                treatment team.               Patients discharged the day of surgery will not be allowed to drive  home.  Name and phone number of your driver:   Special Instructions: [facility] - Preparing for Surgery  Before surgery, you can play an important role.  Because skin is not sterile, your skin needs to be as free of germs as possible.  You can reduce the number of germs on you skin by washing with CHG (chlorahexidine gluconate) soap before surgery.  CHG is an antiseptic cleaner which kills germs and bonds with the skin to continue killing germs even after washing.  Please DO NOT use if you have an allergy to CHG or antibacterial soaps.  If your skin becomes reddened/irritated stop using the CHG and inform your nurse when you arrive at Short Stay.  Do not shave (including legs and underarms) for at least 48 hours prior to the first CHG shower.  You may shave your face.  Please follow these instructions  carefully:   1.  Shower with CHG Soap the night before surgery and the                                morning of Surgery.  2.  If you choose to wash your hair, wash your hair first as usual with your       normal shampoo.  3.  After you shampoo, rinse your hair and body thoroughly to remove the                      Shampoo.  4.  Use CHG as you would any other liquid soap.  You can apply chg directly       to the skin and wash gently with scrungie or a clean washcloth.  5.  Apply the CHG Soap to your body ONLY FROM THE NECK DOWN.        Do not use on open wounds or open sores.  Avoid contact with your eyes,       ears, mouth and genitals (private  parts).  Wash genitals (private parts)       with your normal soap.  6.  Wash thoroughly, paying special attention to the area where your surgery        will be performed.  7.  Thoroughly rinse your body with warm water from the neck down.  8.  DO NOT shower/wash with your normal soap after using and rinsing off       the CHG Soap.  9.  Pat yourself dry with a clean towel.            10.  Wear clean pajamas.            11.  Place clean sheets on your bed the night of your first shower and do not        sleep with pets.  Day of Surgery  Do not apply any lotions/deoderants the morning of surgery.  Please wear clean clothes to the hospital/surgery center.     Please read over the following fact sheets that you were given: Pain Booklet, Coughing and Deep Breathing, Blood Transfusion Information, MRSA Information and Surgical Site Infection Prevention

## 2013-04-26 MED ORDER — DEXTROSE 5 % IV SOLN
1.5000 g | INTRAVENOUS | Status: AC
  Administered 2013-04-27: 1.5 g via INTRAVENOUS
  Filled 2013-04-26: qty 1.5

## 2013-04-26 NOTE — Progress Notes (Signed)
Anesthesia Chart Review:  Patient is a 80 year old male scheduled for right CEA on 04/27/13 by Dr. Kellie Simmering.  He presented to ED with recurrent LUE weakness on 04/18/13 and CT and MRI confirmed a subacute infarct involving the posterior right frontal lobe.  Carotid duplex showed 80-99% right ICA stenosis with 1-39% left CEA.  Dr. Kellie Simmering recommended right CEA for symptomatic carotid disease. He is continuing patient on Plavix.  Other history includes former smoker, AAA (4.5 cm 03/2012 and 3.9 cm 10/17/12 by ultrasound), prostate cancer s/p seed implant '01, left CEA '08. PCP is Dr. Kelton Pillar.   EKG on 04/25/13 showed NSR.  Echo on 04/18/13 showed: - Left ventricle: The cavity size was normal. Wall thickness was normal. Systolic function was mildly reduced. The estimated ejection fraction was in the range of 45% to 50%. Diffuse hypokinesis. Although no diagnostic regional wall motion abnormality was identified, this possibility cannot be completely excluded on the basis of this study. Features are consistent with a pseudonormal left ventricular filling pattern, with concomitant abnormal relaxation and increased filling pressure (grade 2 diastolic dysfunction). - Aortic valve: Trivial regurgitation. - Mitral valve: Trivial, late systolic prolapse, involving the posterior leaflet. Trivial regurgitation. - Pulmonary arteries: PA peak pressure: 71mm Hg (S).  CXR report on 04/18/13 showed: Diffusely increased interstitial markings greater on the left likely reflect interval development of pulmonary fibrosis. Acute pneumonitis could produce similar findings but given the patient's asymptomatic state, this is felt to be unlikely.   Preoperative labs noted.   Above including echo reviewed with anesthesiologist Dr. Tobias Alexander.  Further evaluation on the day of surgery.  If no acute CV/CHF symptoms then it is anticipated that he can proceed as planned.  Myra Gianotti, PA-C [facility] Short Stay  Center/Anesthesiology Phone [phone] 04/26/2013 1:25 PM

## 2013-04-27 ENCOUNTER — Encounter (HOSPITAL_COMMUNITY)
Admission: RE | Disposition: A | Discharge status: Home / Self Care | Payer: Self-pay | Source: Ambulatory Visit | Attending: Vascular Surgery

## 2013-04-27 ENCOUNTER — Inpatient Hospital Stay (HOSPITAL_COMMUNITY): Payer: Medicare Other | Admitting: Certified Registered Nurse Anesthetist

## 2013-04-27 ENCOUNTER — Encounter (HOSPITAL_COMMUNITY): Payer: Medicare Other | Admitting: Vascular Surgery

## 2013-04-27 ENCOUNTER — Encounter (HOSPITAL_COMMUNITY): Payer: Self-pay | Admitting: Certified Registered Nurse Anesthetist

## 2013-04-27 ENCOUNTER — Telehealth: Payer: Self-pay | Admitting: Vascular Surgery

## 2013-04-27 ENCOUNTER — Inpatient Hospital Stay (HOSPITAL_COMMUNITY)
Admission: RE | Admit: 2013-04-27 | Discharge: 2013-04-28 | DRG: 039 | Disposition: A | Discharge status: Home / Self Care | Payer: Medicare Other | Source: Ambulatory Visit | Attending: Vascular Surgery | Admitting: Vascular Surgery

## 2013-04-27 DIAGNOSIS — I63239 Cerebral infarction due to unspecified occlusion or stenosis of unspecified carotid arteries: Secondary | ICD-10-CM

## 2013-04-27 DIAGNOSIS — Z8249 Family history of ischemic heart disease and other diseases of the circulatory system: Secondary | ICD-10-CM

## 2013-04-27 DIAGNOSIS — Z8673 Personal history of transient ischemic attack (TIA), and cerebral infarction without residual deficits: Secondary | ICD-10-CM

## 2013-04-27 DIAGNOSIS — I6529 Occlusion and stenosis of unspecified carotid artery: Secondary | ICD-10-CM

## 2013-04-27 DIAGNOSIS — Z87891 Personal history of nicotine dependence: Secondary | ICD-10-CM

## 2013-04-27 DIAGNOSIS — E785 Hyperlipidemia, unspecified: Secondary | ICD-10-CM | POA: Diagnosis present

## 2013-04-27 DIAGNOSIS — Z823 Family history of stroke: Secondary | ICD-10-CM

## 2013-04-27 HISTORY — PX: CAROTID ENDARTERECTOMY: SUR193

## 2013-04-27 HISTORY — PX: ENDARTERECTOMY: SHX5162

## 2013-04-27 SURGERY — ENDARTERECTOMY, CAROTID
Anesthesia: General | Site: Neck | Laterality: Right

## 2013-04-27 MED ORDER — FENTANYL CITRATE 0.05 MG/ML IJ SOLN
INTRAMUSCULAR | Status: AC
  Filled 2013-04-27: qty 2

## 2013-04-27 MED ORDER — ATROPINE SULFATE 0.4 MG/ML IJ SOLN: INTRAMUSCULAR | Status: DC | PRN

## 2013-04-27 MED ORDER — DEXAMETHASONE SODIUM PHOSPHATE 10 MG/ML IJ SOLN
INTRAMUSCULAR | Status: DC | PRN
  Administered 2013-04-27: 8 mg via INTRAVENOUS

## 2013-04-27 MED ORDER — FENTANYL CITRATE 0.05 MG/ML IJ SOLN
INTRAMUSCULAR | Status: DC | PRN
  Administered 2013-04-27: 100 ug via INTRAVENOUS
  Administered 2013-04-27: 50 ug via INTRAVENOUS

## 2013-04-27 MED ORDER — SODIUM CHLORIDE 0.9 % IV SOLN
10.0000 mg | INTRAVENOUS | Status: DC | PRN
  Administered 2013-04-27: 20 ug/min via INTRAVENOUS

## 2013-04-27 MED ORDER — POTASSIUM CHLORIDE CRYS ER 20 MEQ PO TBCR: 20.0000 meq | EXTENDED_RELEASE_TABLET | Freq: Every day | ORAL | Status: DC | PRN

## 2013-04-27 MED ORDER — ARTIFICIAL TEARS OP OINT
TOPICAL_OINTMENT | OPHTHALMIC | Status: DC | PRN
  Administered 2013-04-27: 1 via OPHTHALMIC

## 2013-04-27 MED ORDER — ROSUVASTATIN CALCIUM 5 MG PO TABS
5.0000 mg | ORAL_TABLET | Freq: Every day | ORAL | Status: DC
  Administered 2013-04-27: 5 mg via ORAL
  Filled 2013-04-27 (×2): qty 1

## 2013-04-27 MED ORDER — 0.9 % SODIUM CHLORIDE (POUR BTL) OPTIME
TOPICAL | Status: DC | PRN
  Administered 2013-04-27: 2000 mL

## 2013-04-27 MED ORDER — LIDOCAINE HCL (PF) 1 % IJ SOLN
INTRAMUSCULAR | Status: DC | PRN
  Administered 2013-04-27: .5 mL

## 2013-04-27 MED ORDER — ALUM & MAG HYDROXIDE-SIMETH 200-200-20 MG/5ML PO SUSP: 15.0000 mL | ORAL | Status: DC | PRN

## 2013-04-27 MED ORDER — GUAIFENESIN-DM 100-10 MG/5ML PO SYRP: 15.0000 mL | ORAL_SOLUTION | ORAL | Status: DC | PRN

## 2013-04-27 MED ORDER — SODIUM CHLORIDE 0.9 % IV SOLN: 500.0000 mL | Freq: Once | INTRAVENOUS | Status: AC | PRN

## 2013-04-27 MED ORDER — FENTANYL CITRATE 0.05 MG/ML IJ SOLN
25.0000 ug | INTRAMUSCULAR | Status: DC | PRN
  Administered 2013-04-27: 50 ug via INTRAVENOUS

## 2013-04-27 MED ORDER — SODIUM CHLORIDE 0.9 % IV SOLN: INTRAVENOUS | Status: DC

## 2013-04-27 MED ORDER — DOCUSATE SODIUM 100 MG PO CAPS: 100.0000 mg | ORAL_CAPSULE | Freq: Every day | ORAL | Status: DC

## 2013-04-27 MED ORDER — ATROPINE SULFATE 0.4 MG/ML IJ SOLN
INTRAMUSCULAR | Status: DC | PRN
  Administered 2013-04-27: .1 mg via INTRAVENOUS

## 2013-04-27 MED ORDER — SUCCINYLCHOLINE CHLORIDE 20 MG/ML IJ SOLN
INTRAMUSCULAR | Status: AC
  Filled 2013-04-27: qty 1

## 2013-04-27 MED ORDER — METOPROLOL TARTRATE 1 MG/ML IV SOLN: 2.0000 mg | INTRAVENOUS | Status: DC | PRN

## 2013-04-27 MED ORDER — ROCURONIUM BROMIDE 50 MG/5ML IV SOLN
INTRAVENOUS | Status: AC
  Filled 2013-04-27: qty 1

## 2013-04-27 MED ORDER — ARTIFICIAL TEARS OP OINT
TOPICAL_OINTMENT | OPHTHALMIC | Status: AC
  Filled 2013-04-27: qty 3.5

## 2013-04-27 MED ORDER — STERILE WATER FOR INJECTION IJ SOLN
INTRAMUSCULAR | Status: AC
  Filled 2013-04-27: qty 20

## 2013-04-27 MED ORDER — PROPOFOL 10 MG/ML IV BOLUS
INTRAVENOUS | Status: AC
  Filled 2013-04-27: qty 20

## 2013-04-27 MED ORDER — HYDRALAZINE HCL 20 MG/ML IJ SOLN
10.0000 mg | INTRAMUSCULAR | Status: DC | PRN
  Administered 2013-04-27 (×2): 10 mg via INTRAVENOUS
  Filled 2013-04-27 (×2): qty 1

## 2013-04-27 MED ORDER — HEPARIN SODIUM (PORCINE) 1000 UNIT/ML IJ SOLN
INTRAMUSCULAR | Status: DC | PRN
  Administered 2013-04-27: 6000 [IU] via INTRAVENOUS

## 2013-04-27 MED ORDER — DEXAMETHASONE SODIUM PHOSPHATE 4 MG/ML IJ SOLN
INTRAMUSCULAR | Status: AC
  Filled 2013-04-27: qty 2

## 2013-04-27 MED ORDER — BIOTENE DRY MOUTH MT LIQD
15.0000 mL | OROMUCOSAL | Status: DC
  Administered 2013-04-27 – 2013-04-28 (×4): 15 mL via OROMUCOSAL

## 2013-04-27 MED ORDER — GLYCOPYRROLATE 0.2 MG/ML IJ SOLN
INTRAMUSCULAR | Status: AC
  Filled 2013-04-27: qty 4

## 2013-04-27 MED ORDER — OXYCODONE HCL 5 MG PO TABS: 5.0000 mg | ORAL_TABLET | Freq: Four times a day (QID) | ORAL | Status: DC | PRN

## 2013-04-27 MED ORDER — CEFUROXIME SODIUM 1.5 G IJ SOLR
1.5000 g | Freq: Two times a day (BID) | INTRAMUSCULAR | Status: DC
  Filled 2013-04-27: qty 1.5

## 2013-04-27 MED ORDER — SODIUM CHLORIDE 0.9 % IJ SOLN
INTRAMUSCULAR | Status: AC
Start: 2013-04-27 — End: 2013-04-27
  Filled 2013-04-27: qty 10

## 2013-04-27 MED ORDER — GLYCOPYRROLATE 0.2 MG/ML IJ SOLN
INTRAMUSCULAR | Status: DC | PRN
  Administered 2013-04-27: .6 mg via INTRAVENOUS
  Administered 2013-04-27: .4 mg via INTRAVENOUS

## 2013-04-27 MED ORDER — ASPIRIN EC 81 MG PO TBEC
81.0000 mg | DELAYED_RELEASE_TABLET | Freq: Every morning | ORAL | Status: DC
  Filled 2013-04-27: qty 1

## 2013-04-27 MED ORDER — FENTANYL CITRATE 0.05 MG/ML IJ SOLN
INTRAMUSCULAR | Status: AC
  Filled 2013-04-27: qty 5

## 2013-04-27 MED ORDER — MORPHINE SULFATE 2 MG/ML IJ SOLN
2.0000 mg | INTRAMUSCULAR | Status: DC | PRN
  Administered 2013-04-27: 2 mg via INTRAVENOUS
  Filled 2013-04-27: qty 1

## 2013-04-27 MED ORDER — ONDANSETRON HCL 4 MG/2ML IJ SOLN: 4.0000 mg | Freq: Four times a day (QID) | INTRAMUSCULAR | Status: DC | PRN

## 2013-04-27 MED ORDER — LORATADINE 10 MG PO TABS
10.0000 mg | ORAL_TABLET | Freq: Every day | ORAL | Status: DC
  Administered 2013-04-27: 10 mg via ORAL
  Filled 2013-04-27 (×3): qty 1

## 2013-04-27 MED ORDER — ACETAMINOPHEN 325 MG PO TABS: 325.0000 mg | ORAL_TABLET | ORAL | Status: DC | PRN

## 2013-04-27 MED ORDER — NEOSTIGMINE METHYLSULFATE 1 MG/ML IJ SOLN
INTRAMUSCULAR | Status: DC | PRN
  Administered 2013-04-27: 5 mg via INTRAVENOUS

## 2013-04-27 MED ORDER — THROMBIN 20000 UNITS EX SOLR: CUTANEOUS | Status: DC | PRN

## 2013-04-27 MED ORDER — THROMBIN 20000 UNITS EX SOLR
CUTANEOUS | Status: DC | PRN
  Administered 2013-04-27: 20000 [IU] via TOPICAL

## 2013-04-27 MED ORDER — SODIUM CHLORIDE 0.9 % IR SOLN
Status: DC | PRN
  Administered 2013-04-27: 08:00:00

## 2013-04-27 MED ORDER — ACETAMINOPHEN 650 MG RE SUPP: 325.0000 mg | RECTAL | Status: DC | PRN

## 2013-04-27 MED ORDER — PROPOFOL 10 MG/ML IV BOLUS
INTRAVENOUS | Status: DC | PRN
  Administered 2013-04-27: 160 mg via INTRAVENOUS

## 2013-04-27 MED ORDER — LIDOCAINE HCL (PF) 1 % IJ SOLN
INTRAMUSCULAR | Status: AC
  Filled 2013-04-27: qty 30

## 2013-04-27 MED ORDER — DEXTROSE 5 % IV SOLN
1.5000 g | Freq: Two times a day (BID) | INTRAVENOUS | Status: AC
  Administered 2013-04-27: 1.5 g via INTRAVENOUS
  Filled 2013-04-27: qty 1.5

## 2013-04-27 MED ORDER — PROTAMINE SULFATE 10 MG/ML IV SOLN
INTRAVENOUS | Status: DC | PRN
  Administered 2013-04-27: 10 mg via INTRAVENOUS
  Administered 2013-04-27 (×2): 20 mg via INTRAVENOUS

## 2013-04-27 MED ORDER — THROMBIN 20000 UNITS EX SOLR
CUTANEOUS | Status: AC
  Filled 2013-04-27: qty 20000

## 2013-04-27 MED ORDER — PANTOPRAZOLE SODIUM 40 MG PO TBEC
40.0000 mg | DELAYED_RELEASE_TABLET | Freq: Every day | ORAL | Status: DC
  Administered 2013-04-27: 40 mg via ORAL
  Filled 2013-04-27: qty 1

## 2013-04-27 MED ORDER — SODIUM CHLORIDE 0.9 % IV SOLN
INTRAVENOUS | Status: DC
  Administered 2013-04-27: 12:00:00 via INTRAVENOUS

## 2013-04-27 MED ORDER — GLYCOPYRROLATE 0.2 MG/ML IJ SOLN
INTRAMUSCULAR | Status: AC
  Filled 2013-04-27: qty 1

## 2013-04-27 MED ORDER — ROCURONIUM BROMIDE 100 MG/10ML IV SOLN
INTRAVENOUS | Status: DC | PRN
  Administered 2013-04-27: 50 mg via INTRAVENOUS

## 2013-04-27 MED ORDER — LIDOCAINE HCL (CARDIAC) 20 MG/ML IV SOLN
INTRAVENOUS | Status: DC | PRN
  Administered 2013-04-27: 80 mg via INTRAVENOUS

## 2013-04-27 MED ORDER — OXYCODONE HCL 5 MG PO TABS
5.0000 mg | ORAL_TABLET | Freq: Once | ORAL | Status: DC | PRN
Start: 2013-04-27 — End: 2013-04-27

## 2013-04-27 MED ORDER — CLOPIDOGREL BISULFATE 75 MG PO TABS
75.0000 mg | ORAL_TABLET | Freq: Every day | ORAL | Status: DC
  Administered 2013-04-28: 75 mg via ORAL
  Filled 2013-04-27 (×2): qty 1

## 2013-04-27 MED ORDER — LABETALOL HCL 5 MG/ML IV SOLN: 10.0000 mg | INTRAVENOUS | Status: DC | PRN

## 2013-04-27 MED ORDER — OXYCODONE HCL 5 MG PO TABS
5.0000 mg | ORAL_TABLET | ORAL | Status: DC | PRN
  Administered 2013-04-27: 5 mg via ORAL
  Filled 2013-04-27 (×2): qty 1

## 2013-04-27 MED ORDER — PHENOL 1.4 % MT LIQD: 1.0000 | OROMUCOSAL | Status: DC | PRN

## 2013-04-27 MED ORDER — ONDANSETRON HCL 4 MG/2ML IJ SOLN
INTRAMUSCULAR | Status: DC | PRN
  Administered 2013-04-27: 4 mg via INTRAVENOUS

## 2013-04-27 MED ORDER — PROTAMINE SULFATE 10 MG/ML IV SOLN
INTRAVENOUS | Status: AC
  Filled 2013-04-27: qty 5

## 2013-04-27 MED ORDER — OXYCODONE HCL 5 MG/5ML PO SOLN: 5.0000 mg | Freq: Once | ORAL | Status: DC | PRN

## 2013-04-27 MED ORDER — NEOSTIGMINE METHYLSULFATE 1 MG/ML IJ SOLN
INTRAMUSCULAR | Status: AC
  Filled 2013-04-27: qty 10

## 2013-04-27 MED ORDER — LACTATED RINGERS IV SOLN
INTRAVENOUS | Status: DC | PRN
  Administered 2013-04-27 (×2): via INTRAVENOUS

## 2013-04-27 MED ORDER — ATORVASTATIN CALCIUM 10 MG PO TABS
10.0000 mg | ORAL_TABLET | Freq: Every day | ORAL | Status: DC
  Filled 2013-04-27: qty 1

## 2013-04-27 MED ORDER — HEPARIN SODIUM (PORCINE) 1000 UNIT/ML IJ SOLN
INTRAMUSCULAR | Status: AC
  Filled 2013-04-27: qty 1

## 2013-04-27 MED ORDER — ONDANSETRON HCL 4 MG/2ML IJ SOLN
INTRAMUSCULAR | Status: AC
  Filled 2013-04-27: qty 2

## 2013-04-27 SURGICAL SUPPLY — 54 items
ADH SKN CLS APL DERMABOND .7 (GAUZE/BANDAGES/DRESSINGS) ×1
CANISTER SUCTION 2500CC (MISCELLANEOUS) ×2 IMPLANT
CATH ROBINSON RED A/P 18FR (CATHETERS) ×2 IMPLANT
CATH SUCT 10FR WHISTLE TIP (CATHETERS) ×2 IMPLANT
CLIP TI MEDIUM 24 (CLIP) ×2 IMPLANT
CLIP TI WIDE RED SMALL 24 (CLIP) ×2 IMPLANT
COVER SURGICAL LIGHT HANDLE (MISCELLANEOUS) ×2 IMPLANT
CRADLE DONUT ADULT HEAD (MISCELLANEOUS) ×2 IMPLANT
DECANTER SPIKE VIAL GLASS SM (MISCELLANEOUS) IMPLANT
DERMABOND ADVANCED (GAUZE/BANDAGES/DRESSINGS) ×1
DERMABOND ADVANCED .7 DNX12 (GAUZE/BANDAGES/DRESSINGS) IMPLANT
DRAIN HEMOVAC 1/8 X 5 (WOUND CARE) ×1 IMPLANT
DRAPE WARM FLUID 44X44 (DRAPE) ×2 IMPLANT
DRSG COVADERM 4X8 (GAUZE/BANDAGES/DRESSINGS) ×1 IMPLANT
ELECT CAUTERY BLADE 6.4 (BLADE) ×1 IMPLANT
ELECT REM PT RETURN 9FT ADLT (ELECTROSURGICAL) ×2
ELECTRODE REM PT RTRN 9FT ADLT (ELECTROSURGICAL) ×1 IMPLANT
EVACUATOR SILICONE 100CC (DRAIN) ×1 IMPLANT
GLOVE BIO SURGEON STRL SZ 6.5 (GLOVE) ×3 IMPLANT
GLOVE BIOGEL PI IND STRL 6.5 (GLOVE) IMPLANT
GLOVE BIOGEL PI IND STRL 7.0 (GLOVE) IMPLANT
GLOVE BIOGEL PI INDICATOR 6.5 (GLOVE) ×1
GLOVE BIOGEL PI INDICATOR 7.0 (GLOVE) ×2
GLOVE SS BIOGEL STRL SZ 7 (GLOVE) ×1 IMPLANT
GLOVE SUPERSENSE BIOGEL SZ 7 (GLOVE) ×1
GOWN STRL REUS W/ TWL LRG LVL3 (GOWN DISPOSABLE) ×3 IMPLANT
GOWN STRL REUS W/ TWL XL LVL3 (GOWN DISPOSABLE) IMPLANT
GOWN STRL REUS W/TWL LRG LVL3 (GOWN DISPOSABLE) ×6
GOWN STRL REUS W/TWL XL LVL3 (GOWN DISPOSABLE) ×2
INSERT FOGARTY SM (MISCELLANEOUS) ×2 IMPLANT
KIT BASIN OR (CUSTOM PROCEDURE TRAY) ×2 IMPLANT
KIT ROOM TURNOVER OR (KITS) ×2 IMPLANT
NEEDLE 22X1 1/2 (OR ONLY) (NEEDLE) ×1 IMPLANT
NS IRRIG 1000ML POUR BTL (IV SOLUTION) ×4 IMPLANT
PACK CAROTID (CUSTOM PROCEDURE TRAY) ×2 IMPLANT
PAD ARMBOARD 7.5X6 YLW CONV (MISCELLANEOUS) ×4 IMPLANT
PATCH HEMASHIELD 8X75 (Vascular Products) ×1 IMPLANT
PENCIL BUTTON HOLSTER BLD 10FT (ELECTRODE) ×1 IMPLANT
SHUNT CAROTID BYPASS 12FRX15.5 (VASCULAR PRODUCTS) IMPLANT
SPONGE GAUZE 4X4 12PLY (GAUZE/BANDAGES/DRESSINGS) ×1 IMPLANT
SUT ETHILON 3 0 PS 1 (SUTURE) ×1 IMPLANT
SUT PROLENE 6 0 CC (SUTURE) ×4 IMPLANT
SUT SILK 2 0 FS (SUTURE) ×2 IMPLANT
SUT SILK 3 0 TIES 17X18 (SUTURE)
SUT SILK 3-0 18XBRD TIE BLK (SUTURE) IMPLANT
SUT VIC AB 2-0 CT1 27 (SUTURE) ×2
SUT VIC AB 2-0 CT1 TAPERPNT 27 (SUTURE) ×1 IMPLANT
SUT VIC AB 3-0 X1 27 (SUTURE) ×2 IMPLANT
SYR CONTROL 10ML LL (SYRINGE) IMPLANT
SYR TB 1ML LUER SLIP (SYRINGE) ×1 IMPLANT
TAPE CLOTH SURG 4X10 WHT LF (GAUZE/BANDAGES/DRESSINGS) ×1 IMPLANT
TOWEL OR 17X24 6PK STRL BLUE (TOWEL DISPOSABLE) ×2 IMPLANT
TOWEL OR 17X26 10 PK STRL BLUE (TOWEL DISPOSABLE) ×2 IMPLANT
WATER STERILE IRR 1000ML POUR (IV SOLUTION) ×2 IMPLANT

## 2013-04-27 NOTE — Telephone Encounter (Addendum)
Message copied by Gena Fray on Fri Apr 27, 2013  3:56 PM ------      Message from: Denman George      Created: Fri Apr 27, 2013  9:26 AM                   ----- Message -----         From: Gabriel Earing, PA-C         Sent: 04/27/2013   9:09 AM           To: [facility] Charge Pool            S/p right CEA 04/27/13.  F/u with Dr. Kellie Simmering in 2 weeks.            Thanks,      Samantha ------  04/27/13: lm for patient re appt with JDL on 05/15/13 @ 11:45am, dpm

## 2013-04-27 NOTE — Progress Notes (Signed)
Utilization review completed.  

## 2013-04-27 NOTE — Interval H&P Note (Signed)
History and Physical Interval Note:  04/27/2013 7:22 AM  [name] [name]  has presented today for surgery, with the diagnosis of Right Internal Carotid Artery Stenosis with recent right brain CVA  The various methods of treatment have been discussed with the patient and family. After consideration of risks, benefits and other options for treatment, the patient has consented to  Procedure(s): ENDARTERECTOMY CAROTID (Right) as a surgical intervention .  The patient's history has been reviewed, patient examined, no change in status, stable for surgery.  I have reviewed the patient's chart and labs.  Questions were answered to the patient's satisfaction.     [name]

## 2013-04-27 NOTE — Op Note (Signed)
OPERATIVE REPORT  Date of Surgery: 04/27/2013  Surgeon: [name], MD  Assistant: Aldona Bar Rhyne/PA  Pre-op Diagnosis: Severe Right Internal Carotid Artery Stenosis with recent right brain CVA  Post-op Diagnosis: Same  Procedure: Procedure(s): Right carotid endarterectomy with Dacron patch angioplasty   Anesthesia: General  EBL: 347 cc  Complications: None  Procedure Details: The patient was taken to the operating room and placed in the supine position. Following induction of satisfactory general endotracheal anesthesia the right neck was prepped and draped in a routine sterile manner. Incision was made on the anterior border of the sternocleidomastoid muscle and carried down through the subcutaneous tissue and platysma using the Bovie. Care was taken not to injure the hypoglossal nerve.. The common internal and external carotid arteries were dissected free. There was a calcified atherosclerotic plaque at the carotid bifurcation extending up the internal carotid artery. A #10 shunt was then prepared and the patient was heparinized. The carotid vessels were occluded with vascular clamps. A longitudinal opening was made in the common carotid with a 15 blade extended up the internal carotid with the Potts scissors to a point distal to the disease. The plaque was approximately 90 % stenotic in severity and severely ulcerated. The distal vessel appeared normal. Shunt was inserted without difficulty reestablishing flow in about 2 minutes. A standard endarterectomy was performed with an eversion endarterectomy of the external carotid. The plaque feathered off  the distal internal carotid artery nicely not requiring any tacking sutures. The lumen was thoroughly irrigated with heparinized saline and loose debris all carefully removed. The arterotomy was then closed with a patch using continuous 6-0 Prolene. Prior to completion of the  Closure the  shunt was removed after approximately 30 minutes of  shunt time. Flow was then reestablished up the external branch initially followed by the internal branch. Protamine was given to her reverse the heparin.Following adequate hemostasis the wound was irrigated with saline and a 10 French Jackson-Pratt drain brought out through an inferiorly based stab wound and secured with a nylon suture and closed in layers with Vicryl in a subcuticular fashion. Sterile dressing was applied and the patient taken to the recovery room in stable condition.  [name], MD 04/27/2013 9:38 AM

## 2013-04-27 NOTE — Anesthesia Preprocedure Evaluation (Signed)
Anesthesia Evaluation  Patient identified by MRN, date of birth, ID band Patient awake    Reviewed: Allergy & Precautions, H&P , NPO status , Patient's Chart, lab work & pertinent test results  Airway Mallampati: II  Neck ROM: full    Dental   Pulmonary former smoker,          Cardiovascular + Peripheral Vascular Disease  AAA   Neuro/Psych TIACVA    GI/Hepatic   Endo/Other    Renal/GU      Musculoskeletal   Abdominal   Peds  Hematology   Anesthesia Other Findings   Reproductive/Obstetrics                           Anesthesia Physical Anesthesia Plan  ASA: III  Anesthesia Plan: General   Post-op Pain Management:    Induction: Intravenous  Airway Management Planned: Oral ETT  Additional Equipment: Arterial line  Intra-op Plan:   Post-operative Plan: Extubation in OR  Informed Consent: I have reviewed the patients History and Physical, chart, labs and discussed the procedure including the risks, benefits and alternatives for the proposed anesthesia with the patient or authorized representative who has indicated his/her understanding and acceptance.     Plan Discussed with: CRNA, Anesthesiologist and Surgeon  Anesthesia Plan Comments:         Anesthesia Quick Evaluation

## 2013-04-27 NOTE — Anesthesia Postprocedure Evaluation (Signed)
Anesthesia Post Note  Patient: [name] [name]  Procedure(s) Performed: Procedure(s) (LRB): ENDARTERECTOMY CAROTID (Right)  Anesthesia type: General  Patient location: PACU  Post pain: Pain level controlled and Adequate analgesia  Post assessment: Post-op Vital signs reviewed, Patient's Cardiovascular Status Stable, Respiratory Function Stable, Patent Airway and Pain level controlled  Last Vitals:  Filed Vitals:   04/27/13 1045  BP:   Pulse: 64  Temp:   Resp: 16    Post vital signs: Reviewed and stable  Level of consciousness: awake, alert  and oriented  Complications: No apparent anesthesia complications

## 2013-04-27 NOTE — Preoperative (Signed)
Beta Blockers   Reason not to administer Beta Blockers:Not Applicable 

## 2013-04-27 NOTE — H&P (View-Only) (Signed)
Vascular Surgery Consultation  Reason for Consult: Severe right ICA stenosis and right brain CVA  HPI: [name] [name] is a 80 y.o. male who presents for evaluation of severe right ICA stenosis and right brain CVA. Patient is well known to me having undergone left carotid endarterectomy in 2008 for severe stenosis. He was having left brain TIAs prior to the surgery and has not had any recurrent TIAs in that distribution. One week ago he had 2 episodes of transient clumsiness in the left upper tremor each lasting about 15-20 minutes and completely resolving. He denies any problems with speech, vision, balance, or lower extremity or facial asymmetry. Yesterday he developed recurrent left upper extremity clumsiness which did not resolve. Evaluation at the hospital has revealed evidence of a small subacute-acute infarct in the posterior aspect of the right frontal lobe consistent with his symptoms. He was on aspirin at the time of the event.     Past Medical History  Diagnosis Date  . Allergy   . Hyperlipidemia   . Prostate cancer   . TIA (transient ischemic attack)   . Diplopia   . Carotid artery stenosis   . AAA (abdominal aortic aneurysm)    Past Surgical History  Procedure Laterality Date  . Foot surgery       left Achilles Tendon Repair  . Eye surgery      Bilateral cataract   . Carotid endarterectomy  Sept. 2008    Elective left carotid endarterectomy   History   Social History  . Marital Status: Married    Spouse Name: N/A    Number of Children: N/A  . Years of Education: N/A   Social History Main Topics  . Smoking status: Former Smoker    Types: Cigarettes    Quit date: 03/27/1964  . Smokeless tobacco: Never Used  . Alcohol Use: Yes     Comment: occasional  . Drug Use: No  . Sexual Activity:    Other Topics Concern  . None   Social History Narrative  . None   Family History  Problem Relation Age of Onset  . Stroke Mother   . Heart disease Mother   .  Stroke Father   . Heart attack Father   . Heart disease Brother    Allergies  Allergen Reactions  . Sudafed [Pseudoephedrine Hcl]     Causes dizziness and nervousness   Prior to Admission medications   Medication Sig Start Date End Date Taking? Authorizing Provider  acetaminophen (TYLENOL) 500 MG tablet Take 1,000 mg by mouth every 4 (four) hours as needed for mild pain.   Yes Historical Provider, MD  aspirin EC 81 MG tablet Take 81 mg by mouth every morning.   Yes Historical Provider, MD  cephALEXin (KEFLEX) 500 MG capsule Take 500 mg by mouth 3 (three) times daily. 04/17/13 04/22/13 Yes Historical Provider, MD  Fexofenadine HCl (ALLEGRA PO) Take 1 tablet by mouth every morning.   Yes Historical Provider, MD  Multiple Vitamins-Minerals (CENTRUM SILVER PO) Take 1 tablet by mouth daily.    Yes Historical Provider, MD  Omega-3 Fatty Acids (FISH OIL) 1000 MG CPDR Take 1,000 mg by mouth every morning.    Yes Historical Provider, MD  rosuvastatin (CRESTOR) 5 MG tablet Take 5 mg by mouth at bedtime.    Historical Provider, MD     Positive ROS: Denies chest pain, dyspnea on exertion, PND, orthopnea, claudication, back discomfort.  All other systems have been reviewed and were otherwise negative with the  exception of those mentioned in the HPI and as above.  Physical Exam: Filed Vitals:   04/19/13 1044  BP: 128/83  Pulse: 69  Temp: 97.4 F (36.3 C)  Resp: 18    General: Alert, no acute distress HEENT: Normal for age Cardiovascular: Regular rate and rhythm. Carotid pulses 2+, no bruits audible Respiratory: Clear to auscultation. No cyanosis, no use of accessory musculature GI: No organomegaly, abdomen is soft with small pulsatile mass-nontender Skin: No lesions in the area of chief complaint Neurologic: Sensation intact distally Psychiatric: Patient is competent for consent with normal mood and affect Musculoskeletal: No obvious deformities Extremities: 2+ femoral and posterior tibial  pulses palpable bilaterally    Imaging reviewed: Carotid duplex exam performed yesterday revealed 80-90% right ICA stenosis. 6 months ago the degree of stenosis and our office was only 60%. The left carotid endarterectomy site is widely patent.    Assessment/Plan:  #1 severe right ICA stenosis with small right brain CVA by CT scan and MRI scan and the posterior aspect of the right frontal lobe-nonhemorrhagic with small deficit left upper extremity which is rapidly improving #2 known small abdominal aortic aneurysm which measured 4.5 cm January 2014  Plan elective right carotid endarterectomy in about 8 days the patient has had small CVA and right hemisphere. Discussed this with patient and his daughter in the operative risk of stroke which approximates 3% in a symptomatic patient and he would like to proceed next week. We'll also need continued followup of his small bowel aortic aneurysm which I have been following in the office. Would recommend putting the patient on Plavix 75 mg daily beginning today and will continue up until the time of surgery which is scheduled for Friday, February 6-okay to DC home today   [name], MD 04/19/2013 10:47 AM

## 2013-04-27 NOTE — Transfer of Care (Signed)
Immediate Anesthesia Transfer of Care Note  Patient: [name] [name]  Procedure(s) Performed: Procedure(s): ENDARTERECTOMY CAROTID (Right)  Patient Location: PACU  Anesthesia Type:General  Level of Consciousness: awake, alert , oriented and patient cooperative  Airway & Oxygen Therapy: Patient Spontanous Breathing and Patient connected to nasal cannula oxygen  Post-op Assessment: Report given to PACU RN, Post -op Vital signs reviewed and stable, Patient moving all extremities X 4 and Patient able to stick tongue midline  Post vital signs: Reviewed and stable  Complications: No apparent anesthesia complications

## 2013-04-28 LAB — CBC
HCT: 34.7 % — ABNORMAL LOW (ref 39.0–52.0)
Hemoglobin: 11.9 g/dL — ABNORMAL LOW (ref 13.0–17.0)
MCH: 30.9 pg (ref 26.0–34.0)
MCHC: 34.3 g/dL (ref 30.0–36.0)
MCV: 90.1 fL (ref 78.0–100.0)
Platelets: 170 10*3/uL (ref 150–400)
RBC: 3.85 MIL/uL — ABNORMAL LOW (ref 4.22–5.81)
RDW: 14.1 % (ref 11.5–15.5)
WBC: 10.6 10*3/uL — AB (ref 4.0–10.5)

## 2013-04-28 LAB — BASIC METABOLIC PANEL
BUN: 13 mg/dL (ref 6–23)
CHLORIDE: 103 meq/L (ref 96–112)
CO2: 23 mEq/L (ref 19–32)
CREATININE: 0.88 mg/dL (ref 0.50–1.35)
Calcium: 8.5 mg/dL (ref 8.4–10.5)
GFR calc non Af Amer: 78 mL/min — ABNORMAL LOW (ref >90)
Glucose, Bld: 121 mg/dL — ABNORMAL HIGH (ref 70–99)
POTASSIUM: 4.6 meq/L (ref 3.7–5.3)
Sodium: 139 mEq/L (ref 137–147)

## 2013-04-28 NOTE — Progress Notes (Signed)
   Daily Progress Note  Assessment/Planning: POD #1 s/p R CEA   Neuro intact  If SaO2 ok, can d/c home  D/C JP  Subjective  - 1 Day Post-Op  No complaints  Objective Filed Vitals:   04/27/13 1924 04/27/13 2300 04/28/13 0300 04/28/13 0349  BP:  113/70 108/61   Pulse: 102 89 73 81  Temp:  98.1 F (36.7 C) 98.4 F (36.9 C)   TempSrc:  Oral Oral   Resp: 16 17 15 16   Height:      Weight:      SpO2: 96% 96% 97% 97%    Intake/Output Summary (Last 24 hours) at 04/28/13 0819 Last data filed at 04/28/13 0600  Gross per 24 hour  Intake   2045 ml  Output   1265 ml  Net    780 ml    PULM  CTAB CV  RRR GI  soft, NTND VASC  R neck inc c/d/i, no hematoma, JP only 40 cc NEURO sym M/S, midline tongue  Laboratory CBC    Component Value Date/Time   WBC 10.6* 04/28/2013 0338   HGB 11.9* 04/28/2013 0338   HCT 34.7* 04/28/2013 0338   PLT 170 04/28/2013 0338    BMET    Component Value Date/Time   NA 139 04/28/2013 0338   K 4.6 04/28/2013 0338   CL 103 04/28/2013 0338   CO2 23 04/28/2013 0338   GLUCOSE 121* 04/28/2013 0338   BUN 13 04/28/2013 0338   CREATININE 0.88 04/28/2013 0338   CALCIUM 8.5 04/28/2013 0338   GFRNONAA 78* 04/28/2013 0338   GFRAA >90 04/28/2013 B[place], MD [facility] Office: [phone] Pager: [phone]  04/28/2013, 8:19 AM

## 2013-04-28 NOTE — Discharge Summary (Signed)
[facility] Discharge Summary  [name] [name] [birth year] 80 y.o. male  [id number]  Admission Date: 04/27/2013  Discharge Date: 04/28/13  Physician: Mal Misty, MD  Admission Diagnosis: Right Internal Carotid Artery Stenosis with recent right brain CVA   HPI:   This is a 80 y.o. male who presents for evaluation of severe right ICA stenosis and right brain CVA. Patient is well known to me having undergone left carotid endarterectomy in 2008 for severe stenosis. He was having left brain TIAs prior to the surgery and has not had any recurrent TIAs in that distribution. One week ago he had 2 episodes of transient clumsiness in the left upper tremor each lasting about 15-20 minutes and completely resolving. He denies any problems with speech, vision, balance, or lower extremity or facial asymmetry. Yesterday he developed recurrent left upper extremity clumsiness which did not resolve. Evaluation at the hospital has revealed evidence of a small subacute-acute infarct in the posterior aspect of the right frontal lobe consistent with his symptoms. He was on aspirin at the time of the event.   Hospital Course:  The patient was admitted to the hospital and taken to the operating room on 04/27/2013 and underwent right carotid endarterectomy.  The pt tolerated the procedure well and was transported to the PACU in good condition.   By POD 1, the pt neuro status was in tact.  The JP drain was removed.  He was doing well and discharged home.  The remainder of the hospital course consisted of increasing mobilization and increasing intake of solids without difficulty.    Recent Labs  04/25/13 1451 04/28/13 0338  NA 137 139  K 4.8 4.6  CL 100 103  CO2 24 23  GLUCOSE 126* 121*  BUN 17 13  CALCIUM 9.4 8.5    Recent Labs  04/25/13 1451 04/28/13 0338  WBC 8.1 10.6*  HGB 14.2 11.9*  HCT 40.5 34.7*  PLT 183 170    Recent Labs  04/25/13 1451  INR 1.13    Discharge  Instructions:   The patient is discharged to home with extensive instructions on wound care and progressive ambulation.  They are instructed not to drive or perform any heavy lifting until returning to see the physician in his office.  Discharge Orders   Future Appointments Provider Department Dept Phone   04/30/2013 10:15 AM Isaias Cowman, [place] [phone]   04/30/2013 11:00 AM [facility] [place] [phone]   05/15/2013 11:45 AM Mal Misty, MD [facility] [phone]   10/16/2013 9:00 AM [facility] [address] [phone]   Eat a light meal the night before the exam Nothing to eat or drink for at least 8 hours before exam No gum chewing, or smoking the morning of the exam. Please take your morning medications with small sips of water, especially blood pressure medication *Very Important* Please wear 2 piece clothing   10/16/2013 9:30 AM [facility] [place] [phone]   10/16/2013 10:30 AM Mal Misty, MD [facility] [phone]   Future Orders Complete By Expires   Call MD for:  redness, tenderness, or signs of infection (pain, swelling, bleeding, redness, odor or green/yellow discharge around incision site)  As directed    Call MD for:  severe or increased pain, loss or decreased feeling  in affected limb(s)  As directed  Call MD for:  temperature >100.5  As directed    CAROTID Sugery: Call MD for difficulty swallowing or speaking; weakness in arms or legs that is a new symtom; severe headache.  If you have increased swelling in the neck and/or  are having difficulty breathing, CALL 911  As directed    Discharge wound care:  As directed    Comments:     Shower daily with soap and water starting 04/29/13   Driving Restrictions  As directed    Comments:      No driving for 2 weeks   Lifting restrictions  As directed    Comments:     No lifting for 2 weeks   Resume previous diet  As directed       Discharge Diagnosis:  Right Internal Carotid Artery Stenosis with recent right brain CVA  Secondary Diagnosis: Patient Active Problem List   Diagnosis Date Noted  . Occlusion and stenosis of carotid artery with cerebral infarction 04/19/2013  . CVA (cerebral infarction) 04/18/2013  . Other and unspecified hyperlipidemia 04/18/2013  . Carotid stenosis 04/18/2013  . Abdominal aneurysm without mention of rupture 04/18/2012  . Occlusion and stenosis of carotid artery without mention of cerebral infarction 10/12/2011   Past Medical History  Diagnosis Date  . Allergy   . Hyperlipidemia   . Prostate cancer   . TIA (transient ischemic attack)   . Diplopia   . Carotid artery stenosis   . AAA (abdominal aortic aneurysm)   . Stroke 04/18/13    some weakness left-returning strength      Medication List         acetaminophen 500 MG tablet  Commonly known as:  TYLENOL  Take 1,000 mg by mouth every 4 (four) hours as needed for mild pain.     ALLEGRA PO  Take 1 tablet by mouth every morning.     aspirin EC 81 MG tablet  Take 81 mg by mouth every morning.     CENTRUM SILVER PO  Take 1 tablet by mouth daily.     clopidogrel 75 MG tablet  Commonly known as:  PLAVIX  Take 1 tablet (75 mg total) by mouth daily with breakfast.     Fish Oil 1000 MG Cpdr  Take 1,000 mg by mouth every morning.     oxyCODONE 5 MG immediate release tablet  Commonly known as:  ROXICODONE  Take 1 tablet (5 mg total) by mouth every 6 (six) hours as needed for severe pain.     rosuvastatin 5 MG tablet  Commonly known as:  CRESTOR  Take 5 mg by mouth at bedtime.        Roxicodone #30 No Refill  Disposition: home   Patient's condition: is Good  Follow up: 1. Dr.  Kellie Simmering in 2 weeks.   Leontine Locket, PA-C [facility] [phone]  --- For [facility] use --- Instructions: Press F2 to tab through selections.  Delete question if not applicable.   Modified Rankin score at D/C (0-6): 0  IV medication needed for:  1. Hypertension: No 2. Hypotension: No  Post-op Complications: No  1. Post-op CVA or TIA: No  If yes: Event classification (right eye, left eye, right cortical, left cortical, verterobasilar, other): n/a  If yes: Timing of event (intra-op, <6 hrs post-op, >=6 hrs post-op, unknown): n/a  2. CN injury: No  If yes: CN n/a injuried   3. Myocardial infarction: No  If yes: Dx by (EKG or clinical,  Troponin): n/a  4.  CHF: No  5.  Dysrhythmia (new): No  6. Wound infection: No  7. Reperfusion symptoms: No  8. Return to OR: No  If yes: return to OR for (bleeding, neurologic, other CEA incision, other): n/a  Discharge medications: Statin use:  Yes If No: [ ]  For Medical reasons, [ ]  Non-compliant, [ ]  Not-indicated ASA use:  Yes  If No: [ ]  For Medical reasons, [ ]  Non-compliant, [ ]  Not-indicated Beta blocker use:  No If No: [ ]  For Medical reasons, [ ]  Non-compliant, [ ]  Not-indicated ACE-Inhibitor use:  No If No: [ ]  For Medical reasons, [ ]  Non-compliant, [ ]  Not-indicated P2Y12 Antagonist use: No, [ ]  Plavix, [ ]  Plasugrel, [ ]  Ticlopinine, [ ]  Ticagrelor, [ ]  Other, [ ]  No for medical reason, [ ]  Non-compliant, [ ]  Not-indicated Anti-coagulant use:  No, [ ]  Warfarin, [ ]  Rivaroxaban, [ ]  Dabigatran, [ ]  Other, [ ]  No for medical reason, [ ]  Non-compliant, [ ]  Not-indicated

## 2013-04-28 NOTE — Progress Notes (Signed)
Pt's O2 SATS dropped to 79% on room air during his walk this morning. Pt returned to room and O2 applied. O2 SATS now 96%. Pt now resting in chair on room air O2 SATS 914% on room air. Will continue to monitor.

## 2013-04-29 LAB — TYPE AND SCREEN
ABO/RH(D): O POS
Antibody Screen: NEGATIVE
UNIT DIVISION: 0
Unit division: 0

## 2013-04-30 ENCOUNTER — Encounter (HOSPITAL_COMMUNITY): Payer: Self-pay | Admitting: Vascular Surgery

## 2013-04-30 ENCOUNTER — Ambulatory Visit: Payer: Medicare Other | Admitting: *Deleted

## 2013-04-30 ENCOUNTER — Ambulatory Visit: Payer: Medicare Other | Attending: Family Medicine | Admitting: Physical Therapy

## 2013-04-30 DIAGNOSIS — Z8673 Personal history of transient ischemic attack (TIA), and cerebral infarction without residual deficits: Secondary | ICD-10-CM | POA: Insufficient documentation

## 2013-04-30 DIAGNOSIS — R279 Unspecified lack of coordination: Secondary | ICD-10-CM | POA: Insufficient documentation

## 2013-04-30 DIAGNOSIS — Z5189 Encounter for other specified aftercare: Secondary | ICD-10-CM | POA: Insufficient documentation

## 2013-04-30 DIAGNOSIS — R5381 Other malaise: Secondary | ICD-10-CM | POA: Insufficient documentation

## 2013-05-02 ENCOUNTER — Telehealth: Payer: Self-pay | Admitting: Neurology

## 2013-05-08 ENCOUNTER — Other Ambulatory Visit: Payer: Self-pay | Admitting: Vascular Surgery

## 2013-05-08 DIAGNOSIS — Z48812 Encounter for surgical aftercare following surgery on the circulatory system: Secondary | ICD-10-CM

## 2013-05-08 DIAGNOSIS — I6529 Occlusion and stenosis of unspecified carotid artery: Secondary | ICD-10-CM

## 2013-05-09 NOTE — Telephone Encounter (Signed)
Called patient and spoke with wife [name] [name] and wife stated that the patient needed an appt to come in sooner than June, because patient had a stroke in January. I made an appt for the patient for 05/29/13 at 3 pm. I advised the patient's wife that if the patient has any other problems, questions or concerns to call the office. Patient's wife verbalized understanding.

## 2013-05-09 NOTE — Telephone Encounter (Signed)
Patient's wife calling again to state that patient had stroke in January and that he needs to be seen sooner than scheduled June appointment. Please call patient's wife and advise.

## 2013-05-10 ENCOUNTER — Ambulatory Visit: Payer: Medicare Other | Admitting: Occupational Therapy

## 2013-05-14 ENCOUNTER — Encounter: Payer: Self-pay | Admitting: Vascular Surgery

## 2013-05-14 ENCOUNTER — Encounter: Payer: Medicare Other | Admitting: *Deleted

## 2013-05-15 ENCOUNTER — Ambulatory Visit (INDEPENDENT_AMBULATORY_CARE_PROVIDER_SITE_OTHER): Payer: Self-pay | Admitting: Vascular Surgery

## 2013-05-15 ENCOUNTER — Encounter: Payer: Self-pay | Admitting: Vascular Surgery

## 2013-05-15 VITALS — BP 143/93 | HR 66

## 2013-05-15 DIAGNOSIS — I6529 Occlusion and stenosis of unspecified carotid artery: Secondary | ICD-10-CM

## 2013-05-15 DIAGNOSIS — I714 Abdominal aortic aneurysm, without rupture, unspecified: Secondary | ICD-10-CM | POA: Insufficient documentation

## 2013-05-15 NOTE — Progress Notes (Signed)
Subjective:     Patient ID: [name] [name], male   DOB: 12/02/1929, 80 y.o.   MRN: [id number]  HPI this 80 year old male is seen 3 weeks post right carotid endarterectomy for severe right ICA stenosis having suffered a preoperative mild right brain CVA. He has done very well postoperatively with no new neurologic symptoms. He had some clumsiness in the left upper extremity which is rapidly improving. He reports no other symptoms including lateralizing weakness, aphasia, amaurosis fugax, diplopia, blurred vision, and syncope. He is beginning to ambulate well.  Review of Systems     Objective:   Physical Exam BP 143/93  Pulse 10  General well-developed well-nourished male no apparent stress port and oriented x3 Right neck incision healing nicely with no swelling infection or hematoma. 3+ carotid pulse palpable with number elbow. Neurologic exam essentially normal with very minimal clumsiness left upper chairman he Abdomen soft nontender small pulsatile mass noted approximately 4 cm      Assessment:     #1 status post right carotid endarterectomy for severe right ICA stenosis having suffered right brain CVA preoperatively-progressing nicely AAA stable in the past 6 months and proximally for 4-1/2 cm in diameter needs further monitoring in 6 months    Plan:     Plan return in 6 months with carotid duplex exam and duplex exam of abdominal aortic aneurysm. Patient will continue daily aspirin It develops any neurologic symptoms we'll be in touch with [place]

## 2013-05-21 ENCOUNTER — Encounter: Payer: Medicare Other | Admitting: *Deleted

## 2013-05-25 ENCOUNTER — Ambulatory Visit (INDEPENDENT_AMBULATORY_CARE_PROVIDER_SITE_OTHER): Payer: Medicare Other | Admitting: Cardiovascular Disease

## 2013-05-25 ENCOUNTER — Encounter: Payer: Self-pay | Admitting: Cardiovascular Disease

## 2013-05-25 VITALS — BP 124/62 | HR 76 | Ht 68.5 in | Wt 182.0 lb

## 2013-05-25 DIAGNOSIS — I714 Abdominal aortic aneurysm, without rupture, unspecified: Secondary | ICD-10-CM

## 2013-05-25 DIAGNOSIS — R079 Chest pain, unspecified: Secondary | ICD-10-CM

## 2013-05-25 DIAGNOSIS — I2510 Atherosclerotic heart disease of native coronary artery without angina pectoris: Secondary | ICD-10-CM

## 2013-05-25 DIAGNOSIS — I63239 Cerebral infarction due to unspecified occlusion or stenosis of unspecified carotid arteries: Secondary | ICD-10-CM

## 2013-05-25 NOTE — Assessment & Plan Note (Signed)
F/U duplex with Dr Kellie Simmering  No palpable mass  CT 2/15 measures 3.5 cm

## 2013-05-25 NOTE — Assessment & Plan Note (Signed)
Recent RCEA with TIA continue ASA  F/u duplex Lawson  Slow resolution of hematoma but not tender

## 2013-05-25 NOTE — Patient Instructions (Addendum)
Your physician wants you to follow-up in:   6 MONTHS WITH DR NISHAN' You will receive a reminder letter in the mail two months in advance. If you don't receive a letter, please call our office to schedule the follow-up appointment. Your physician recommends that you continue on your current medications as directed. Please refer to the Current Medication list given to you today.   Your physician has requested that you have a lexiscan myoview. For further information please visit [email or web address]. Please follow instruction sheet, as given.  

## 2013-05-25 NOTE — Progress Notes (Signed)
Patient ID: [name] [name], male   DOB: 1929-07-18, 80 y.o.   MRN: [id number]  80 yo referred by Dr Kellie Simmering for vascular disease.  Recent hospitalization for stroke  History of AAA and left CEA  Had TIA and just had RCE done Denies history of CAD.  Has not had stress test before  Still golfing Moves a bit slow.  No chest pain Mild exertional dsypnea.   Has had small hematoma post CEA  Improving  On asa and statin  Denies claudication.  Echo in hospital had no discrete RWMA but diffuse hypokinesis EF 45%  Reviewed   ROS: Denies fever, malais, weight loss, blurry vision, decreased visual acuity, cough, sputum, SOB, hemoptysis, pleuritic pain, palpitaitons, heartburn, abdominal pain, melena, lower extremity edema, claudication, or rash.  All other systems reviewed and negative   General: Affect appropriate Healthy:  appears stated age 71: normal Neck supple with no adenopathy JVP normal no bruits no thyromegaly  Previous left CEA and recent RCEA Lungs clear with no wheezing and good diaphragmatic motion Heart:  S1/S2 no murmur,rub, gallop or click PMI normal Abdomen: benighn, BS positve, no tenderness, no AAA no bruit.  No HSM or HJR Distal pulses intact with no bruits No edema Neuro non-focal Skin warm and dry No muscular weakness  Medications Current Outpatient Prescriptions  Medication Sig Dispense Refill  . acetaminophen (TYLENOL) 500 MG tablet Take 1,000 mg by mouth every 4 (four) hours as needed for mild pain.      Marland Kitchen aspirin EC 81 MG tablet Take 81 mg by mouth every morning.      Marland Kitchen Fexofenadine HCl (ALLEGRA PO) Take 1 tablet by mouth every morning.      . Multiple Vitamins-Minerals (CENTRUM SILVER PO) Take 1 tablet by mouth daily.       . Omega-3 Fatty Acids (FISH OIL) 1000 MG CPDR Take 1,000 mg by mouth every morning.       . rosuvastatin (CRESTOR) 5 MG tablet Take 5 mg by mouth at bedtime.       No current facility-administered medications for this visit.     Allergies Sudafed and Atorvastatin  Family History: Family History  Problem Relation Age of Onset  . Stroke Mother   . Heart disease Mother   . Stroke Father   . Heart attack Father   . Heart disease Brother     Social History: History   Social History  . Marital Status: Married    Spouse Name: N/A    Number of Children: N/A  . Years of Education: N/A   Occupational History  . Not on file.   Social History Main Topics  . Smoking status: Former Smoker    Types: Cigarettes    Quit date: 03/27/1964  . Smokeless tobacco: Never Used     Comment: occ alcohol  . Alcohol Use: Yes     Comment: occasional  . Drug Use: No  . Sexual Activity: Not on file   Other Topics Concern  . Not on file   Social History Narrative  . No narrative on file    Electrocardiogram:  SR rate 70 normal no old MI   Assessment and Plan

## 2013-05-25 NOTE — Assessment & Plan Note (Signed)
Vasculopath with bilateral CEAls and AAA  Exertional dyspnea with echo showing decreased LV function Agree with lexiscan myovue to r/o CAD  Unable to walk well enough at advanced age to do treadmill

## 2013-05-29 ENCOUNTER — Encounter: Payer: Self-pay | Admitting: Neurology

## 2013-05-29 ENCOUNTER — Ambulatory Visit (INDEPENDENT_AMBULATORY_CARE_PROVIDER_SITE_OTHER): Payer: Medicare Other | Admitting: Neurology

## 2013-05-29 VITALS — BP 103/65 | HR 79 | Ht 69.0 in | Wt 181.3 lb

## 2013-05-29 DIAGNOSIS — G3184 Mild cognitive impairment, so stated: Secondary | ICD-10-CM

## 2013-05-29 DIAGNOSIS — R413 Other amnesia: Secondary | ICD-10-CM | POA: Insufficient documentation

## 2013-05-29 MED ORDER — CLOPIDOGREL BISULFATE 75 MG PO TABS: 75.0000 mg | ORAL_TABLET | Freq: Every day | ORAL | Status: DC

## 2013-05-29 MED ORDER — CEREFOLIN 6-1-50-5 MG PO TABS: 1.0000 | ORAL_TABLET | Freq: Every day | ORAL | Status: DC

## 2013-05-29 NOTE — Patient Instructions (Signed)
I had a long discussion with the patient and wife regarding his recent stroke, discuss results of evaluation, secondary stroke prevention strategies and answered questions. We also discussed at length memory loss, mild cognitive impairment and risk of progression to early dementia. I recommend he change aspirin to Plavix a second stroke prevention and maintain strict control of hypertension with blood pressure goal below 130/90 and lipids with LDL cholesterol goal below 100 mg percent. Check Wideman B12, TSH, RPR and EEG for treatable causes of cognitive impairment. Continue fish on a daily and start Cerefolin NAC one tablet daily. I also encouraged him to increase participation and cognitively challenging activities like playing bridge, solving crossword puzzles or sudoku. Return for followup in 2 months or call earlier if necessary.  Mild Neurocognitive Disorder Mild neurocognitive disorder (formerly known as mild cognitive impairment) is a mental disorder. It is a slight abnormal decrease in mental function. The areas of mental function affected may include memory, thought, communication, behavior, and completion of tasks. The decrease is noticeable and measurable but does not interfere substantially with your daily activities. Mild neurocognitive disorder typically occurs in people older than 60 years but can occur earlier. It is not as serious as major neurocognitive disorder (formerly known as dementia) but may lead to a more serious neurocognitive disorder. However, in some cases the condition does not progress. A few people with mild neurocognitive disorder even improve. CAUSES  There are a number of different causes of mild neurocognitive disorder:   Brain disorders associated with abnormal protein deposits, such as Alzheimer disease, Pick disease, and Lewy body disease.  Brain disorders associated with abnormal movement, such as Parkinson disease and Huntington disease.  Diseases affecting  blood vessels in the brain and resulting in mini-strokes.  Certain infections such as human immunodeficiency virus (HIV) infection.  Traumatic brain injury.  Other medical conditions such as brain tumors, underactive thyroid (hypothyroidism), and vitamin B12 deficiency.  Use of certain prescription medicine and "recreational" drugs. SYMPTOMS  Symptoms of mild neurocognitive disorder include:  Difficulty remembering You may forget details of recent events, names, or phone numbers. You may forget important social events and appointments or repeatedly forget where you put your car keys.  Difficulty thinking and solving problems You may have difficulty with complex tasks such as paying bills or driving in unfamiliar locations.  Difficulty communicating You may have difficulty finding the right word, naming an object, forming a sentence that makes sense, or understanding what you read or hear.  Changes in your behavior or personality You may lose interest in the things that you used to enjoy or withdraw from social situations. You may get angry more easily than usual. You may act before thinking. You may do things in public that you would not usually do. You may hear or see things that are not real (hallucinations). You may believe falsely that others are trying to hurt you (paranoia). DIAGNOSIS Mild neurocognitive disorder is diagnosed through an assessment by your health care provider. Your health care provider will ask you and your family, friends, or coworkers questions about your symptoms, their frequency, their duration and progression, and the effect they are having on your life. Your health care provider may refer you to a neurologist or mental health specialist for a detailed evaluation of your mental functions (neuropsychological testing).  To identify the cause of your mild neurocognitive disorder, your health care provider may:  Obtain a detailed medical history.  Ask about alcohol  and drug use, including prescription  medicine.  Perform a physical exam.  Order blood tests and brain imaging exams. TREATMENT  Mild neurocognitive disorder caused by infections, use of certain medicines or recreational drugs, and certain medical conditions may improve with treatment of the condition that is causing mild neurocognitive disorder. Mild neurocognitive disorder resulting from other causes generally does not improve and may worsen. In these cases, the goal of treatment is to slow progression of the disorder and help you cope with the loss of cognitive function. Treatments in these cases include:   Medicine Medication helps mainly with memory loss and behavioral symptoms.   Talk therapy Talk therapy provides education, emotional support, memory aids, and other ways of compensating for decreases in mental function.   Lifestyle changes These include regular exercise, a healthy diet (including essential omega-3 fatty acids), intellectual stimulation, and increased social interaction. Document Released: 11/08/2012 Document Reviewed: 11/08/2012 [facility] Patient Information 2014 [place].

## 2013-05-29 NOTE — Progress Notes (Signed)
[facility] [address] [place]. [place] [address] [phone]       OFFICE FOLLOW-UP NOTE  [name] Date of Birth:  1929-06-11 Medical Record Number:  [id number]   HPI: 87 year Caucasian male seen for first office followup visit from hospital consideration for stroke in January 20/15. He presented with difficulty picking things up with his left hand as well as grip weakness while playing golf. He also subsequently diescribed, headache and some speech and language difficulties and double vision. His symptoms improved soon after arrival and hence he was not considered for thrombolysis. CT scan of the head showed an area of low attenuation in the right anterior limb internal capsule suggestive of a subacute lacunar infarct which was subsequently  confirmed on MRI . 2D echo showed ejection fraction of 45-50% with diffuse hypokinesis. Carotid Doppler showed progressive 80-99% right ICA stenosis and 1-39% left ICA stenosis at the previous surgical site. Vascular surgery was consulted  . Patient was started on aspirin. Patient was scheduled for and underwent elective right carotid endarterectomy by Dr Kellie Simmering and the procedure went well. Patient was placed on Plavix by me but after the surgery was changed to aspirin. He states his blood pressure is fine and is 103/65 today and it usually runs in the 120s at home. The patient's wife has noted that he has subacute decline in his memory as well as multitasking and cognitive abilities. She is concerned about this and would like evaluation. The patient himself denies this. He has not had an evaluation for these complaints before.  ROS:   14 system review of systems is positive for fatigue, runny nose, cough, daytime sleepiness, memory loss and confusion and all other systems negative.  PMH:  Past Medical History  Diagnosis Date  . Allergy   . Hyperlipidemia   . Prostate cancer   . TIA (transient ischemic attack)   .  Diplopia   . Carotid artery stenosis   . AAA (abdominal aortic aneurysm)   . Stroke 04/18/13    some weakness left-returning strength    Social History:  History   Social History  . Marital Status: Married    Spouse Name: N/A    Number of Children: N/A  . Years of Education: N/A   Occupational History  . Not on file.   Social History Main Topics  . Smoking status: Former Smoker    Types: Cigarettes    Quit date: 03/27/1964  . Smokeless tobacco: Never Used     Comment: occ alcohol  . Alcohol Use: Yes     Comment: occasional  . Drug Use: No  . Sexual Activity: Not on file   Other Topics Concern  . Not on file   Social History Narrative  . No narrative on file    Medications:   Current Outpatient Prescriptions on File Prior to Visit  Medication Sig Dispense Refill  . acetaminophen (TYLENOL) 500 MG tablet Take 1,000 mg by mouth every 4 (four) hours as needed for mild pain.      Marland Kitchen aspirin EC 81 MG tablet Take 81 mg by mouth every morning.      Marland Kitchen Fexofenadine HCl (ALLEGRA PO) Take 1 tablet by mouth every morning.      . Multiple Vitamins-Minerals (CENTRUM SILVER PO) Take 1 tablet by mouth daily.       . Omega-3 Fatty Acids (FISH OIL) 1000 MG CPDR Take 1,000 mg by mouth every morning.       Marland Kitchen  rosuvastatin (CRESTOR) 5 MG tablet Take 5 mg by mouth at bedtime.       No current facility-administered medications on file prior to visit.    Allergies:   Allergies  Allergen Reactions  . Sudafed [Pseudoephedrine Hcl]     Causes dizziness and nervousness  . Atorvastatin Other (See Comments)    Reaction: Patient's family reports "confusion" and would prefer crestor     Physical Exam General: well developed, well nourished elderly Caucasian male, seated, in no evident distress Head: head normocephalic and atraumatic. Orohparynx benign Neck: supple with soft right carotid  Bruit. healed right carotid endarterectomy scar Cardiovascular: regular rate and rhythm, no  murmurs Musculoskeletal: no deformity Skin:  no rash/petichiae Vascular:  Normal pulses all extremities Filed Vitals:   05/29/13 1553  BP: 103/65  Pulse: 79   Neurologic Exam Mental Status: Awake and fully alert. Oriented to place and time. Recent and remote memory intact. Attention span, concentration and fund of knowledge appropriate. Mood and affect appropriate.  Cranial Nerves: Fundoscopic exam reveals sharp disc margins. Pupils equal, briskly reactive to light. Extraocular movements full without nystagmus. Visual fields full to confrontation. Hearing intact. Facial sensation intact. Face, tongue, palate moves normally and symmetrically.  Motor: Normal bulk and tone. Normal strength in all tested extremity muscles. Diminished fine finger movements on the left. Orbits right over left upper extremity. Sensory.: intact to touch and pinprick and vibratory sensation.  Coordination: Rapid alternating movements normal in all extremities. Finger-to-nose and heel-to-shin performed accurately bilaterally. Gait and Station: Arises from chair without difficulty. Stance is normal. Gait demonstrates normal stride length and balance . Able to heel, toe and tandem walk with mild difficulty.  Reflexes: 1+ and symmetric. Toes downgoing.   NIHSS  0 Modified Rankin  1   ASSESSMENT: 83 year with right posterior frontal infarct in January 2050 secondary to symptomatic right ICA stenosis for which he has undergone elective right carotid endarterectomy. Multiple vascular risk factors of carotid stenosis, hyperlipidemia, peripheral vascular disease and history of TIA.. he also has pre-existing memory loss which is likely mild cognitive impairment    PLAN: I had a long discussion with the patient and wife regarding his recent stroke, discuss results of evaluation, secondary stroke prevention strategies and answered questions. We also discussed at length memory loss, mild cognitive impairment and risk of  progression to early dementia. I recommend he change aspirin to Plavix a second stroke prevention and maintain strict control of hypertension with blood pressure goal below 130/90 and lipids with LDL cholesterol goal below 100 mg percent. Check Wideman B12, TSH, RPR and EEG for treatable causes of cognitive impairment. Continue fish on a daily and start Cerefolin NAC one tablet daily. I also encouraged him to increase participation and cognitively challenging activities like playing bridge, solving crossword puzzles or sudoku. Return for followup in 2 months or call earlier if necessary. This was a prolonged visit requiring extensive history taking, review of chart, medical decision making of high complexity and total time spent 60 minutes.     Note: This document was prepared with digital dictation and possible smart phrase technology. Any transcriptional errors that result from this process are unintentional

## 2013-05-30 NOTE — Telephone Encounter (Signed)
Patient has an appt for 05/29/13 with Dr. Leonie Man.

## 2013-06-05 ENCOUNTER — Encounter (HOSPITAL_COMMUNITY): Payer: Medicare Other

## 2013-06-06 ENCOUNTER — Ambulatory Visit (HOSPITAL_COMMUNITY): Payer: Medicare Other | Attending: Cardiovascular Disease | Admitting: Radiology

## 2013-06-06 VITALS — BP 165/99 | HR 67 | Ht 70.0 in | Wt 180.0 lb

## 2013-06-06 DIAGNOSIS — I739 Peripheral vascular disease, unspecified: Secondary | ICD-10-CM | POA: Insufficient documentation

## 2013-06-06 DIAGNOSIS — Z87891 Personal history of nicotine dependence: Secondary | ICD-10-CM | POA: Insufficient documentation

## 2013-06-06 DIAGNOSIS — I779 Disorder of arteries and arterioles, unspecified: Secondary | ICD-10-CM | POA: Insufficient documentation

## 2013-06-06 DIAGNOSIS — R0989 Other specified symptoms and signs involving the circulatory and respiratory systems: Principal | ICD-10-CM | POA: Insufficient documentation

## 2013-06-06 DIAGNOSIS — R079 Chest pain, unspecified: Secondary | ICD-10-CM

## 2013-06-06 DIAGNOSIS — E785 Hyperlipidemia, unspecified: Secondary | ICD-10-CM | POA: Insufficient documentation

## 2013-06-06 DIAGNOSIS — R0609 Other forms of dyspnea: Secondary | ICD-10-CM | POA: Insufficient documentation

## 2013-06-06 DIAGNOSIS — Z8673 Personal history of transient ischemic attack (TIA), and cerebral infarction without residual deficits: Secondary | ICD-10-CM | POA: Insufficient documentation

## 2013-06-06 DIAGNOSIS — R0602 Shortness of breath: Secondary | ICD-10-CM

## 2013-06-06 DIAGNOSIS — Z8249 Family history of ischemic heart disease and other diseases of the circulatory system: Secondary | ICD-10-CM | POA: Insufficient documentation

## 2013-06-06 MED ORDER — TECHNETIUM TC 99M SESTAMIBI GENERIC - CARDIOLITE
30.0000 | Freq: Once | INTRAVENOUS | Status: AC | PRN
  Administered 2013-06-06: 30 via INTRAVENOUS

## 2013-06-06 MED ORDER — TECHNETIUM TC 99M SESTAMIBI GENERIC - CARDIOLITE
10.0000 | Freq: Once | INTRAVENOUS | Status: AC | PRN
  Administered 2013-06-06: 10 via INTRAVENOUS

## 2013-06-06 MED ORDER — REGADENOSON 0.4 MG/5ML IV SOLN
0.4000 mg | Freq: Once | INTRAVENOUS | Status: AC
  Administered 2013-06-06: 0.4 mg via INTRAVENOUS

## 2013-06-06 NOTE — Progress Notes (Signed)
[facility] [address] [place], [place] [address] [phone]    Cardiology Nuclear Med Study  [name] [name] is a 80 y.o. male     MRN : [id number]     DOB: Nov 13, 1929  Procedure Date: 06/06/2013  Nuclear Med Background Indication for Stress Test:  Evaluation for Ischemia History:  No known CAD, h/o AAA (4.5cm 2014), Echo 2015 EF 45% Cardiac Risk Factors: Carotid Disease, CVA, Family History - CAD, History of Smoking, Lipids and PVD  Symptoms:  DOE   Nuclear Pre-Procedure Caffeine/Decaff Intake:  None NPO After: 7:00pm   Lungs:  clear O2 Sat: 92% on room air. IV 0.9% NS with Angio Cath:  22g  IV Site: R Antecubital  IV Started by:  Crissie Figures, RN  Chest Size (in):  44 Cup Size: n/a  Height: 5\' 10"  (1.778 m)  Weight:  180 lb (81.647 kg)  BMI:  Body mass index is 25.83 kg/(m^2). Tech Comments:  N/A    Nuclear Med Study 1 or 2 day study: 1 day  Stress Test Type:  Lexiscan  Reading MD: N/A  Order Authorizing Provider:  Jenkins Rouge, MD  Resting Radionuclide: Technetium 21m Sestamibi  Resting Radionuclide Dose: 11.0 mCi   Stress Radionuclide:  Technetium 29m Sestamibi  Stress Radionuclide Dose: 33.0 mCi           Stress Protocol Rest HR: 67 Stress HR: 86  Rest BP: 165/99 Stress BP: 141/72  Exercise Time (min): n/a METS: n/a           Dose of Adenosine (mg):  n/a Dose of Lexiscan: 0.4 mg  Dose of Atropine (mg): n/a Dose of Dobutamine: n/a mcg/kg/min (at max HR)  Stress Test Technologist: Glade Lloyd, BS-ES  Nuclear Technologist:  Charlton Amor, CNMT     Rest Procedure:  Myocardial perfusion imaging was performed at rest 45 minutes following the intravenous administration of Technetium 41m Sestamibi. Rest ECG: NSR - Normal EKG  Stress Procedure:  The patient received IV Lexiscan 0.4 mg over 15-seconds.  Technetium 81m Sestamibi injected at 30-seconds.  Quantitative spect images were obtained after a 45 minute delay. Stress  ECG: There are scattered PVCs.  QPS Raw Data Images:  Normal; no motion artifact; normal heart/lung ratio. Stress Images:  There is mild apical thinning with normal uptake in other regions. Rest Images:  There is mild apical thinning with normal uptake in other regions. Subtraction (SDS):  No evidence of ischemia. Transient Ischemic Dilatation (Normal <1.22):  0.83 Lung/Heart Ratio (Normal <0.45):  0.31  Quantitative Gated Spect Images QGS EDV:  92 ml QGS ESV:  40 ml  Impression Exercise Capacity:  Lexiscan with no exercise. BP Response:  Normal blood pressure response. Clinical Symptoms:  No significant symptoms noted. ECG Impression:  There are scattered PVCs. Comparison with Prior Nuclear Study: No images to compare  Overall Impression:  Normal stress nuclear study.  LV Ejection Fraction: 56%.  LV Wall Motion:  Normal Wall Motion  Pixie Casino, MD, [facility] Board Certified in Nuclear Cardiology Attending Cardiologist [place]

## 2013-06-08 ENCOUNTER — Telehealth: Payer: Self-pay | Admitting: Cardiovascular Disease

## 2013-06-08 NOTE — Telephone Encounter (Signed)
New message     Want husband's stress test results

## 2013-06-08 NOTE — Telephone Encounter (Signed)
Notes Recorded by Josue Hector, MD on 06/07/2013 at 8:59 PM Normal myovue study with no evidence of ischemia or infarction  Advised wife

## 2013-06-11 ENCOUNTER — Other Ambulatory Visit (INDEPENDENT_AMBULATORY_CARE_PROVIDER_SITE_OTHER): Payer: Medicare Other | Admitting: Radiology

## 2013-06-11 ENCOUNTER — Telehealth: Payer: Self-pay | Admitting: Neurology

## 2013-06-11 DIAGNOSIS — R413 Other amnesia: Secondary | ICD-10-CM

## 2013-06-11 DIAGNOSIS — G3184 Mild cognitive impairment, so stated: Secondary | ICD-10-CM

## 2013-06-11 MED ORDER — CEREFOLIN 6-1-50-5 MG PO TABS: 1.0000 | ORAL_TABLET | Freq: Every day | ORAL | Status: DC

## 2013-06-11 NOTE — Telephone Encounter (Signed)
Rx has been resent to [facility]

## 2013-06-11 NOTE — Telephone Encounter (Signed)
Patient was given a coupon for [facility] and they need the prescription faxed to [facility] [phone] or [phone].

## 2013-06-15 ENCOUNTER — Other Ambulatory Visit (INDEPENDENT_AMBULATORY_CARE_PROVIDER_SITE_OTHER): Payer: Self-pay

## 2013-06-15 ENCOUNTER — Telehealth: Payer: Self-pay | Admitting: Neurology

## 2013-06-15 DIAGNOSIS — Z0289 Encounter for other administrative examinations: Secondary | ICD-10-CM

## 2013-06-15 NOTE — Telephone Encounter (Signed)
Patient's wife calling regarding EEG results

## 2013-06-15 NOTE — Telephone Encounter (Signed)
Pt's wife is calling requesting EEG results. Please advise

## 2013-06-16 LAB — TSH: TSH: 3.39 u[IU]/mL (ref 0.450–4.500)

## 2013-06-16 LAB — RPR: RPR: NONREACTIVE

## 2013-06-16 LAB — VITAMIN B12: VITAMIN B 12: 1144 pg/mL — AB (ref 211–946)

## 2013-07-02 NOTE — Telephone Encounter (Signed)
Pt's wife Docia Barrier called back wanting to get pt's test results. Please call wife concerning this matter. Thanks

## 2013-07-02 NOTE — Telephone Encounter (Signed)
Spoke to patient and relayed normal EEG results, per Dr. Leta Baptist - [facility]ID.

## 2013-08-07 ENCOUNTER — Ambulatory Visit: Payer: Medicare Other | Admitting: Neurology

## 2013-08-27 ENCOUNTER — Encounter: Payer: Self-pay | Admitting: Nurse Practitioner

## 2013-08-27 ENCOUNTER — Ambulatory Visit (INDEPENDENT_AMBULATORY_CARE_PROVIDER_SITE_OTHER): Payer: Medicare Other | Admitting: Nurse Practitioner

## 2013-08-27 VITALS — BP 125/79 | HR 76 | Ht 69.0 in | Wt 180.0 lb

## 2013-08-27 DIAGNOSIS — G3184 Mild cognitive impairment, so stated: Secondary | ICD-10-CM

## 2013-08-27 DIAGNOSIS — I6350 Cerebral infarction due to unspecified occlusion or stenosis of unspecified cerebral artery: Secondary | ICD-10-CM

## 2013-08-27 DIAGNOSIS — R413 Other amnesia: Secondary | ICD-10-CM

## 2013-08-27 DIAGNOSIS — I639 Cerebral infarction, unspecified: Secondary | ICD-10-CM

## 2013-08-27 MED ORDER — CEREFOLIN 6-1-50-5 MG PO TABS: 1.0000 | ORAL_TABLET | Freq: Every day | ORAL | Status: DC

## 2013-08-27 NOTE — Patient Instructions (Signed)
Continue clopidogrel 75 mg orally every day  for secondary stroke prevention and maintain strict control of hypertension with blood pressure goal below 130/90, and lipids with LDL cholesterol goal below 100 mg/dL.  Continue Cerefolin NAC for memory. Followup in the future in 6 months, sooner as needed.  Stroke Prevention Some medical conditions and behaviors are associated with an increased chance of having a stroke. You may prevent a stroke by making healthy choices and managing medical conditions. HOW CAN I REDUCE MY RISK OF HAVING A STROKE?   Stay physically active. Get at least 30 minutes of activity on most or all days.  Do not smoke. It may also be helpful to avoid exposure to secondhand smoke.  Limit alcohol use. Moderate alcohol use is considered to be:  No more than 2 drinks per day for men.  No more than 1 drink per day for nonpregnant women.  Eat healthy foods. This involves  Eating 5 or more servings of fruits and vegetables a day.  Following a diet that addresses high blood pressure (hypertension), high cholesterol, diabetes, or obesity.  Manage your cholesterol levels.  A diet low in saturated fat, trans fat, and cholesterol and high in fiber may control cholesterol levels.  Take any prescribed medicines to control cholesterol as directed by your health care provider.  Manage your diabetes.  A controlled-carbohydrate, controlled-sugar diet is recommended to manage diabetes.  Take any prescribed medicines to control diabetes as directed by your health care provider.  Control your hypertension.  A low-salt (sodium), low-saturated fat, low-trans fat, and low-cholesterol diet is recommended to manage hypertension.  Take any prescribed medicines to control hypertension as directed by your health care provider.  Maintain a healthy weight.  A reduced-calorie, low-sodium, low-saturated fat, low-trans fat, low-cholesterol diet is recommended to manage weight.  Stop  drug abuse.  Avoid taking birth control pills.  Talk to your health care provider about the risks of taking birth control pills if you are over 45 years old, smoke, get migraines, or have ever had a blood clot.  Get evaluated for sleep disorders (sleep apnea).  Talk to your health care provider about getting a sleep evaluation if you snore a lot or have excessive sleepiness.  Take medicines as directed by your health care provider.  For some people, aspirin or blood thinners (anticoagulants) are helpful in reducing the risk of forming abnormal blood clots that can lead to stroke. If you have the irregular heart rhythm of atrial fibrillation, you should be on a blood thinner unless there is a good reason you cannot take them.  Understand all your medicine instructions.  Make sure that other other conditions (such as anemia or atherosclerosis) are addressed. SEEK IMMEDIATE MEDICAL CARE IF:   You have sudden weakness or numbness of the face, arm, or leg, especially on one side of the body.  Your face or eyelid droops to one side.  You have sudden confusion.  You have trouble speaking (aphasia) or understanding.  You have sudden trouble seeing in one or both eyes.  You have sudden trouble walking.  You have dizziness.  You have a loss of balance or coordination.  You have a sudden, severe headache with no known cause.  You have new chest pain or an irregular heartbeat. Any of these symptoms may represent a serious problem that is an emergency. Do not wait to see if the symptoms will go away. Get medical help at once. Call your local emergency services  (911 in  [place] Do not drive yourself to the hospital. Document Released: 04/15/2004 Document Revised: 12/27/2012 Document Reviewed: 09/08/2012 [facility] Patient Information 2014 [place].

## 2013-08-27 NOTE — Progress Notes (Signed)
PATIENT: [name] [name] DOB: May 05, 1929  REASON FOR VISIT:  follow up for stroke, memory HISTORY FROM: patient and wife  HISTORY OF PRESENT ILLNESS: 32 year Caucasian male seen for first office followup visit from hospital consideration for stroke in January 2015. He presented with difficulty picking things up with his left hand as well as grip weakness while playing golf. He also subsequently diescribed, headache and some speech and language difficulties and double vision. His symptoms improved soon after arrival and hence he was not considered for thrombolysis. CT scan of the head showed an area of low attenuation in the right anterior limb internal capsule suggestive of a subacute lacunar infarct which was subsequently confirmed on MRI . 2D echo showed ejection fraction of 45-50% with diffuse hypokinesis. Carotid Doppler showed progressive 80-99% right ICA stenosis and 1-39% left ICA stenosis at the previous surgical site. Vascular surgery was consulted . Patient was started on aspirin. Patient was scheduled for and underwent elective right carotid endarterectomy by Dr Kellie Simmering and the procedure went well. Patient was placed on Plavix by me but after the surgery was changed to aspirin. He states his blood pressure is fine and is 103/65 today and it usually runs in the 120s at home. The patient's wife has noted that he has subacute decline in his memory as well as multitasking and cognitive abilities. She is concerned about this and would like evaluation. The patient himself denies this. He has not had an evaluation for these complaints before.   UPDATE 08/27/13 (LL):  Since last visit, patient has continued taking Cerefolin NAC for memory.  He does not notice any change in his memory, but states he knows his short term memory is not as good as it used to be. He is tolerating Plavix well with no signs of significant bleeding or bruising. His blood pressure is well controlled, it is 125/79 in the office  today.  He states he stays active, plays golf a couple days a week.  ROS:  14 system review of systems is positive for Env allergies, difficulty urinating, frequency of urination and all other systems negative.  ALLERGIES: Allergies  Allergen Reactions  . Sudafed [Pseudoephedrine Hcl]     Causes dizziness and nervousness  . Atorvastatin Other (See Comments)    Reaction: Patient's family reports "confusion" and would prefer crestor     HOME MEDICATIONS: Outpatient Prescriptions Prior to Visit  Medication Sig Dispense Refill  . acetaminophen (TYLENOL) 500 MG tablet Take 1,000 mg by mouth every 4 (four) hours as needed for mild pain.      Marland Kitchen clopidogrel (PLAVIX) 75 MG tablet Take 1 tablet (75 mg total) by mouth daily.  30 tablet  11  . Fexofenadine HCl (ALLEGRA PO) Take 1 tablet by mouth every morning.      . Multiple Vitamins-Minerals (CENTRUM SILVER PO) Take 1 tablet by mouth daily.       . Omega-3 Fatty Acids (FISH OIL) 1000 MG CPDR Take 1,000 mg by mouth every morning.       . rosuvastatin (CRESTOR) 5 MG tablet Take 5 mg by mouth at bedtime.      Marland Kitchen aspirin EC 81 MG tablet Take 81 mg by mouth every morning.      Marland Kitchen L-Methylfolate-B12-B6-B2 (CEREFOLIN) 08-20-48-5 MG TABS Take 1 tablet by mouth daily.  90 each  1   No facility-administered medications prior to visit.     PHYSICAL EXAM  Filed Vitals:   08/27/13 1518  BP: 125/79  Pulse: 76  Height: 5\' 9"  (1.753 m)  Weight: 180 lb (81.647 kg)   Body mass index is 26.57 kg/(m^2). No exam data present   Physical Exam  General: well developed, well nourished elderly Caucasian male, seated, in no evident distress  Head: head normocephalic and atraumatic. Orohparynx benign  Neck: supple with soft right carotid Bruit. healed right carotid endarterectomy scar  Cardiovascular: regular rate and rhythm, no murmurs  Musculoskeletal: no deformity  Skin: no rash/petichiae  Vascular: Normal pulses all extremities   Neurologic Exam    Mental Status: Awake and fully alert. Oriented to place and time.Remote memory intact. Attention span, concentration and fund of knowledge appropriate. Mood and affect appropriate. MMSE 27/30 with deficits in delayed recall only, 0/3.  AFT 7.  GDS 0. CDT 4/4. Cranial Nerves: Fundoscopic exam not done.  Pupils equal, briskly reactive to light. Extraocular movements full without nystagmus. Visual fields full to confrontation. Hearing intact. Facial sensation intact. Face, tongue, palate moves normally and symmetrically.  Motor: Normal bulk and tone. Normal strength in all tested extremity muscles. Diminished fine finger movements on the left. Orbits right over left upper extremity.  Sensory: intact to light touch in all 4 extremities. Coordination: Rapid alternating movements normal in all extremities. Finger-to-nose and heel-to-shin performed accurately bilaterally.  Gait and Station: Arises from chair without difficulty. Stance is normal. Gait demonstrates normal stride length and balance.  Able to heel, toe and tandem walk with mild difficulty.  Reflexes: 1+ and symmetric.  DIAGNOSTIC DATA (LABS, IMAGING, TESTING) - I reviewed patient records, labs, notes, testing and imaging myself where available.  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  Multiple vascular risk factors of carotid stenosis, hyperlipidemia, peripheral vascular disease and history of TIA.  He also has pre-existing memory loss which is likely mild cognitive impairment.  PLAN:  I had a long discussion with the patient and wife regarding memory loss, mild cognitive impairment and risk of  progression to early dementia.  Continue Plavix for secondary stroke prevention and maintain strict control of hypertension with blood pressure goal below 140/90 and lipids with LDL cholesterol goal below 100 mg percent.  Continue fish oil on a daily basis and continue Cerefolin NAC one tablet daily. I also encouraged him to increase participation and cognitively challenging activities like playing bridge, solving crossword puzzles or sudoku. Return for followup in 6 months or sooner as necessary.  Meds ordered this encounter  Medications  . L-Methylfolate-B12-B6-B2 (CEREFOLIN) 08-20-48-5 MG TABS    Sig: Take 1 tablet by mouth daily.    Dispense:  90 each    Refill:  3    Order Specific Question:  Supervising Provider    Answer:  Garvin Fila [[id number]]   Return in about 6 months (around 02/26/2014).  Philmore Pali, MSN, NP-C 08/27/2013, 4:34 PM [facility] [address] [place], [place] [address] [phone]  Note: This document was prepared with digital dictation and possible smart phrase technology. Any transcriptional errors that result from this process are unintentional.

## 2013-08-28 NOTE — Progress Notes (Signed)
I agree with above 

## 2013-09-03 NOTE — Progress Notes (Signed)
I agree with above 

## 2013-09-12 ENCOUNTER — Telehealth: Payer: Self-pay | Admitting: *Deleted

## 2013-09-12 NOTE — Telephone Encounter (Signed)
LEFT  VOICE MAIL  TO CALL  BACK   TO  MAKE  AN APPT  IS  DUE  TO   SEE DR  Johnsie Cancel  IN  SEPT 2015.Marland Kitchen/CY

## 2013-09-14 NOTE — Telephone Encounter (Signed)
LMTCB ./CY 

## 2013-09-27 NOTE — Telephone Encounter (Signed)
PT  HAS APPT  ON 11-21-13 AT  2:15 PM .Adonis Housekeeper

## 2013-10-16 ENCOUNTER — Ambulatory Visit (HOSPITAL_COMMUNITY)
Admission: RE | Admit: 2013-10-16 | Discharge: 2013-10-16 | Disposition: A | Discharge status: Home / Self Care | Payer: Medicare Other | Source: Ambulatory Visit | Attending: Vascular Surgery | Admitting: Vascular Surgery

## 2013-10-16 ENCOUNTER — Ambulatory Visit: Payer: Medicare Other | Admitting: Vascular Surgery

## 2013-10-16 ENCOUNTER — Ambulatory Visit (INDEPENDENT_AMBULATORY_CARE_PROVIDER_SITE_OTHER)
Admission: RE | Admit: 2013-10-16 | Discharge: 2013-10-16 | Disposition: A | Discharge status: Home / Self Care | Payer: Medicare Other | Source: Ambulatory Visit | Attending: Vascular Surgery | Admitting: Vascular Surgery

## 2013-10-16 DIAGNOSIS — I6529 Occlusion and stenosis of unspecified carotid artery: Secondary | ICD-10-CM

## 2013-10-16 DIAGNOSIS — I714 Abdominal aortic aneurysm, without rupture, unspecified: Secondary | ICD-10-CM | POA: Diagnosis present

## 2013-10-16 DIAGNOSIS — Z48812 Encounter for surgical aftercare following surgery on the circulatory system: Secondary | ICD-10-CM

## 2013-10-22 ENCOUNTER — Encounter: Payer: Self-pay | Admitting: Vascular Surgery

## 2013-10-23 ENCOUNTER — Ambulatory Visit (INDEPENDENT_AMBULATORY_CARE_PROVIDER_SITE_OTHER): Payer: Medicare Other | Admitting: Vascular Surgery

## 2013-10-23 ENCOUNTER — Encounter: Payer: Self-pay | Admitting: Vascular Surgery

## 2013-10-23 VITALS — BP 124/82 | HR 69 | Ht 69.0 in | Wt 178.1 lb

## 2013-10-23 DIAGNOSIS — I6529 Occlusion and stenosis of unspecified carotid artery: Secondary | ICD-10-CM

## 2013-10-23 DIAGNOSIS — I714 Abdominal aortic aneurysm, without rupture, unspecified: Secondary | ICD-10-CM

## 2013-10-23 DIAGNOSIS — Z48816 Encounter for surgical aftercare following surgery on the genitourinary system: Secondary | ICD-10-CM

## 2013-10-23 NOTE — Progress Notes (Signed)
Subjective:     Patient ID: [name] [name], male   DOB: 1930/01/07, 80 y.o.   MRN: [id number]  HPI this 80 year old male returns for continued followup regarding his bilateral carotid endarterectomies and small abdominal aortic aneurysm. He has no new complaints. He denies any lateralizing weakness, achalasia, amaurosis fugax, diplopia, or syncope. He has no new abdominal or back symptoms.  Past Medical History  Diagnosis Date  . Allergy   . Hyperlipidemia   . Prostate cancer   . TIA (transient ischemic attack)   . Diplopia   . Carotid artery stenosis   . AAA (abdominal aortic aneurysm)   . Stroke 04/18/13    some weakness left-returning strength    History  Substance Use Topics  . Smoking status: Former Smoker    Types: Cigarettes    Quit date: 03/27/1964  . Smokeless tobacco: Never Used     Comment: occ alcohol  . Alcohol Use: Yes     Comment: occasional    Family History  Problem Relation Age of Onset  . Stroke Mother   . Heart disease Mother   . Varicose Veins Mother   . Hypertension Mother   . Stroke Father   . Heart attack Father   . Deep vein thrombosis Father   . Heart disease Brother     Allergies  Allergen Reactions  . Sudafed [Pseudoephedrine Hcl]     Causes dizziness and nervousness  . Atorvastatin Other (See Comments)    Reaction: Patient's family reports "confusion" and would prefer crestor     Current outpatient prescriptions:acetaminophen (TYLENOL) 500 MG tablet, Take 1,000 mg by mouth every 4 (four) hours as needed for mild pain., Disp: , Rfl: ;  clopidogrel (PLAVIX) 75 MG tablet, Take 1 tablet (75 mg total) by mouth daily., Disp: 30 tablet, Rfl: 11;  Fexofenadine HCl (ALLEGRA PO), Take 1 tablet by mouth every morning., Disp: , Rfl:  L-Methylfolate-B12-B6-B2 (CEREFOLIN) 08-20-48-5 MG TABS, Take 1 tablet by mouth daily., Disp: 90 each, Rfl: 3;  Multiple Vitamins-Minerals (CENTRUM SILVER PO), Take 1 tablet by mouth daily. , Disp: , Rfl: ;  Omega-3 Fatty  Acids (FISH OIL) 1000 MG CPDR, Take 1,000 mg by mouth every morning. , Disp: , Rfl: ;  rosuvastatin (CRESTOR) 5 MG tablet, Take 5 mg by mouth at bedtime., Disp: , Rfl:   BP 124/82  Pulse 69  Ht 5\' 9"  (1.753 m)  Wt 178 lb 1.6 oz (80.786 kg)  BMI 26.29 kg/m2  SpO2 96%  Body mass index is 26.29 kg/(m^2).          Review of Systems denies chest pain but does have dyspnea on exertion. All other systems negative and a complete review of systems     Objective:   Physical Exam BP 124/82  Pulse 69  Ht 5\' 9"  (1.753 m)  Wt 178 lb 1.6 oz (80.786 kg)  BMI 26.29 kg/m2  SpO2 96%  Gen.-alert and oriented x3 in no apparent distress HEENT normal for age Lungs no rhonchi or wheezing Cardiovascular regular rhythm no murmurs carotid pulses 3+ palpable no bruits audible Abdomen soft nontender 4 cm pulsatile mass Musculoskeletal free of  major deformities Skin clear -no rashes Neurologic normal Lower extremities 3+ femoral and dorsalis pedis pulses palpable bilaterally with no edema  Today I ordered carotid duplex exam and duplex scan of the abdominal aorta which are reviewed and interpreted. The aneurysm has a maximum diameter of 4.1 cm Both carotid endarterectomy sites are widely patent with no evidence  of restenosis.       Assessment:     Doing well post bilateral carotid endarterectomies-widely patent and asymptomatic Small abdominal aortic aneurysm-4.1 cm and stable    Plan:     Will return in 6 month for carotid duplex exam and see nurse practitioner Will return in 18 months to see nurse practitioner with carotid duplex and duplex scan of the abdominal aorta

## 2013-10-23 NOTE — Addendum Note (Signed)
Addended by: Mena Goes on: 10/23/2013 01:53 PM   Modules accepted: Orders

## 2013-11-21 ENCOUNTER — Ambulatory Visit (INDEPENDENT_AMBULATORY_CARE_PROVIDER_SITE_OTHER): Payer: Medicare Other | Admitting: Cardiovascular Disease

## 2013-11-21 ENCOUNTER — Encounter: Payer: Self-pay | Admitting: Cardiovascular Disease

## 2013-11-21 VITALS — BP 124/78 | HR 72 | Ht 69.0 in | Wt 182.0 lb

## 2013-11-21 DIAGNOSIS — I714 Abdominal aortic aneurysm, without rupture, unspecified: Secondary | ICD-10-CM

## 2013-11-21 DIAGNOSIS — G3184 Mild cognitive impairment, so stated: Secondary | ICD-10-CM

## 2013-11-21 DIAGNOSIS — E785 Hyperlipidemia, unspecified: Secondary | ICD-10-CM

## 2013-11-21 DIAGNOSIS — I6529 Occlusion and stenosis of unspecified carotid artery: Secondary | ICD-10-CM

## 2013-11-21 NOTE — Assessment & Plan Note (Signed)
Stable post CEA with no bruit or significant restenosis  Duplex surveillance per Dr Kellie Simmering

## 2013-11-21 NOTE — Assessment & Plan Note (Signed)
Currently taking B vitamins and folate F/U Leonie Man

## 2013-11-21 NOTE — Assessment & Plan Note (Signed)
Cholesterol is at goal.  Continue current dose of statin and diet Rx.  No myalgias or side effects.  F/U  LFT's in 6 months. Lab Results  Component Value Date   LDLCALC 62 04/19/2013

## 2013-11-21 NOTE — Patient Instructions (Signed)
Your physician wants you to follow-up in: YEAR WITH DR NISHAN  You will receive a reminder letter in the mail two months in advance. If you don't receive a letter, please call our office to schedule the follow-up appointment.  Your physician recommends that you continue on your current medications as directed. Please refer to the Current Medication list given to you today. 

## 2013-11-21 NOTE — Progress Notes (Signed)
Patient ID: [name] [name], male   DOB: 12-30-1929, 80 y.o.   MRN: [id number] 80 yo referred by Dr Kellie Simmering for vascular disease. Recent hospitalization for stroke History of AAA and left CEA Had TIA and just had RCE done  Denies history of CAD. Has not had stress test before Still golfing Moves a bit slow. No chest pain Mild exertional dsypnea.  Has had small hematoma post CEA Improving On asa and statin Denies claudication. Echo in hospital had no discrete RWMA but diffuse hypokinesis EF 45% Reviewed  FU myovue was non ischemic 05/27/13  EF 56%   Recently seen by Dr Kellie Simmering post CEA and 4.1 cm AAA  All stable   Having some memory issues    ROS: Denies fever, malais, weight loss, blurry vision, decreased visual acuity, cough, sputum, SOB, hemoptysis, pleuritic pain, palpitaitons, heartburn, abdominal pain, melena, lower extremity edema, claudication, or rash.  All other systems reviewed and negative  General: Affect appropriate Elderly male  HEENT: normal Neck supple with no adenopathy JVP normal Previous CEAls but  no bruits no thyromegaly Lungs clear with no wheezing and good diaphragmatic motion Heart:  S1/S2 no murmur, no rub, gallop or click PMI normal Abdominal aorta easily palpable and mildly enlarged no pain  no bruit.  No HSM or HJR Distal pulses intact with no bruits No edema Neuro non-focal Skin warm and dry No muscular weakness   Current Outpatient Prescriptions  Medication Sig Dispense Refill  . acetaminophen (TYLENOL) 500 MG tablet Take 1,000 mg by mouth every 4 (four) hours as needed for mild pain.      Marland Kitchen clopidogrel (PLAVIX) 75 MG tablet Take 1 tablet (75 mg total) by mouth daily.  30 tablet  11  . Fexofenadine HCl (ALLEGRA PO) Take 1 tablet by mouth every morning.      Marland Kitchen L-Methylfolate-B12-B6-B2 (CEREFOLIN) 08-20-48-5 MG TABS Take 1 tablet by mouth daily.  90 each  3  . Multiple Vitamins-Minerals (CENTRUM SILVER PO) Take 1 tablet by mouth daily.       . Omega-3 Fatty  Acids (FISH OIL) 1000 MG CPDR Take 1,000 mg by mouth every morning.       . rosuvastatin (CRESTOR) 5 MG tablet Take 5 mg by mouth at bedtime.       No current facility-administered medications for this visit.    Allergies  Sudafed and Atorvastatin  Electrocardiogram:  NSR normal ECG   Assessment and Plan

## 2013-11-21 NOTE — Assessment & Plan Note (Signed)
4.1 cm stable f/u duplex per [facility]

## 2014-01-31 ENCOUNTER — Telehealth: Payer: Self-pay | Admitting: Neurology

## 2014-01-31 NOTE — Telephone Encounter (Signed)
Spoke to patient regarding rescheduling 02/26/14 appointment per Jeani Hawking leaving, rescheduled to Dr. Clydene Fake first available on 04/25/14. Patient verbalized understanding.

## 2014-01-31 NOTE — Telephone Encounter (Signed)
Error

## 2014-02-26 ENCOUNTER — Ambulatory Visit: Payer: Medicare Other | Admitting: Nurse Practitioner

## 2014-04-22 ENCOUNTER — Encounter: Payer: Self-pay | Admitting: Family

## 2014-04-23 ENCOUNTER — Ambulatory Visit (INDEPENDENT_AMBULATORY_CARE_PROVIDER_SITE_OTHER): Payer: Medicare Other | Admitting: Family

## 2014-04-23 ENCOUNTER — Ambulatory Visit (HOSPITAL_COMMUNITY)
Admission: RE | Admit: 2014-04-23 | Discharge: 2014-04-23 | Disposition: A | Discharge status: Home / Self Care | Payer: Medicare Other | Source: Ambulatory Visit | Attending: Family | Admitting: Family

## 2014-04-23 ENCOUNTER — Encounter: Payer: Self-pay | Admitting: Family

## 2014-04-23 VITALS — BP 136/84 | HR 61 | Resp 16 | Ht 70.0 in | Wt 180.0 lb

## 2014-04-23 DIAGNOSIS — Z48816 Encounter for surgical aftercare following surgery on the genitourinary system: Secondary | ICD-10-CM | POA: Insufficient documentation

## 2014-04-23 DIAGNOSIS — Z9889 Other specified postprocedural states: Secondary | ICD-10-CM

## 2014-04-23 DIAGNOSIS — Z48812 Encounter for surgical aftercare following surgery on the circulatory system: Secondary | ICD-10-CM

## 2014-04-23 DIAGNOSIS — I6529 Occlusion and stenosis of unspecified carotid artery: Secondary | ICD-10-CM | POA: Insufficient documentation

## 2014-04-23 DIAGNOSIS — I714 Abdominal aortic aneurysm, without rupture, unspecified: Secondary | ICD-10-CM

## 2014-04-23 DIAGNOSIS — M25473 Effusion, unspecified ankle: Secondary | ICD-10-CM | POA: Insufficient documentation

## 2014-04-23 NOTE — Patient Instructions (Signed)
Stroke Prevention Some health problems and behaviors may make it more likely for you to have a stroke. Below are ways to lessen your risk of having a stroke.   Be active for at least 30 minutes on most or all days.  Do not smoke. Try not to be around others who smoke.  Do not drink too much alcohol.  Do not have more than 2 drinks a day if you are a man.  Do not have more than 1 drink a day if you are a woman and are not pregnant.  Eat healthy foods, such as fruits and vegetables. If you were put on a specific diet, follow the diet as told.  Keep your cholesterol levels under control through diet and medicines. Look for foods that are low in saturated fat, trans fat, cholesterol, and are high in fiber.  If you have diabetes, follow all diet plans and take your medicine as told.  If you have high blood pressure (hypertension), follow all diet plans and take your medicine as told.  Keep a healthy weight. Eat foods that are low in calories, salt, saturated fat, trans fat, and cholesterol.  Do not take drugs.  Avoid birth control pills, if this applies. Talk to your doctor about the risks of taking birth control pills.  Talk to your doctor if you have sleep problems (sleep apnea).  Take all medicine as told by your doctor.  You may be told to take aspirin or blood thinner medicine. Take this medicine as told by your doctor.  Understand your medicine instructions.  Make sure any other conditions you have are being taken care of. GET HELP RIGHT AWAY IF:  You suddenly lose feeling (you feel numb) or have weakness in your face, arm, or leg.  Your face or eyelid hangs down to one side.  You suddenly feel confused.  You have trouble talking (aphasia) or understanding what people are saying.  You suddenly have trouble seeing in one or both eyes.  You suddenly have trouble walking.  You are dizzy.  You lose your balance or your movements are clumsy (uncoordinated).  You  suddenly have a very bad headache and you do not know the cause.  You have new chest pain.  Your heart feels like it is fluttering or skipping a beat (irregular heartbeat). Do not wait to see if the symptoms above go away. Get help right away. Call your local emergency services (911 in [place] Do not drive yourself to the hospital. Document Released: 09/07/2011 Document Revised: 07/23/2013 Document Reviewed: 09/08/2012 [facility] Patient Information 2015 [place], [place]. This information is not intended to replace advice given to you by your health care provider. Make sure you discuss any questions you have with your health care provider.    Abdominal Aortic Aneurysm An aneurysm is a weakened or damaged part of an artery wall that bulges from the normal force of blood pumping through the body. An abdominal aortic aneurysm is an aneurysm that occurs in the lower part of the aorta, the main artery of the body.  The major concern with an abdominal aortic aneurysm is that it can enlarge and burst (rupture) or blood can flow between the layers of the wall of the aorta through a tear (aorticdissection). Both of these conditions can cause bleeding inside the body and can be life threatening unless diagnosed and treated promptly. CAUSES  The exact cause of an abdominal aortic aneurysm is unknown. Some contributing factors are:   A hardening of  the arteries caused by the buildup of fat and other substances in the lining of a blood vessel (arteriosclerosis).  Inflammation of the walls of an artery (arteritis).   Connective tissue diseases, such as Marfan syndrome.   Abdominal trauma.   An infection, such as syphilis or staphylococcus, in the wall of the aorta (infectious aortitis) caused by bacteria. RISK FACTORS  Risk factors that contribute to an abdominal aortic aneurysm may include:  Age older than 51 years.   High blood pressure (hypertension).  Male gender.  Ethnicity (white  race).  Obesity.  Family history of aneurysm (first degree relatives only).  Tobacco use. PREVENTION  The following healthy lifestyle habits may help decrease your risk of abdominal aortic aneurysm:  Quitting smoking. Smoking can raise your blood pressure and cause arteriosclerosis.  Limiting or avoiding alcohol.  Keeping your blood pressure, blood sugar level, and cholesterol levels within normal limits.  Decreasing your salt intake. In somepeople, too much salt can raise blood pressure and increase your risk of abdominal aortic aneurysm.  Eating a diet low in saturated fats and cholesterol.  Increasing your fiber intake by including whole grains, vegetables, and fruits in your diet. Eating these foods may help lower blood pressure.  Maintaining a healthy weight.  Staying physically active and exercising regularly. SYMPTOMS  The symptoms of abdominal aortic aneurysm may vary depending on the size and rate of growth of the aneurysm.Most grow slowly and do not have any symptoms. When symptoms do occur, they may include:  Pain (abdomen, side, lower back, or groin). The pain may vary in intensity. A sudden onset of severe pain may indicate that the aneurysm has ruptured.  Feeling full after eating only small amounts of food.  Nausea or vomiting or both.  Feeling a pulsating lump in the abdomen.  Feeling faint or passing out. DIAGNOSIS  Since most unruptured abdominal aortic aneurysms have no symptoms, they are often discovered during diagnostic exams for other conditions. An aneurysm may be found during the following procedures:  Ultrasonography (A one-time screening for abdominal aortic aneurysm by ultrasonography is also recommended for all men aged 62-75 years who have ever smoked).  X-ray exams.  A computed tomography (CT).  Magnetic resonance imaging (MRI).  Angiography or arteriography. TREATMENT  Treatment of an abdominal aortic aneurysm depends on the size of  your aneurysm, your age, and risk factors for rupture. Medication to control blood pressure and pain may be used to manage aneurysms smaller than 6 cm. Regular monitoring for enlargement may be recommended by your caregiver if:  The aneurysm is 3-4 cm in size (an annual ultrasonography may be recommended).  The aneurysm is 4-4.5 cm in size (an ultrasonography every 6 months may be recommended).  The aneurysm is larger than 4.5 cm in size (your caregiver may ask that you be examined by a vascular surgeon). If your aneurysm is larger than 6 cm, surgical repair may be recommended. There are two main methods for repair of an aneurysm:   Endovascular repair (a minimally invasive surgery). This is done most often.  Open repair. This method is used if an endovascular repair is not possible. Document Released: 12/16/2004 Document Revised: 07/03/2012 Document Reviewed: 04/07/2012 [facility] Patient Information 2015 [place], [place]. This information is not intended to replace advice given to you by your health care provider. Make sure you discuss any questions you have with your health care provider.

## 2014-04-23 NOTE — Progress Notes (Signed)
Established Carotid Patient   History of Present Illness  [name] [name] is a 81 y.o. male patient of Dr. Kellie Simmering who returns for continued followup regarding his bilateral carotid endarterectomies: September, 2008 (left ), and April 27, 2013 (right). He also has a small abdominal aortic aneurysm which is scheduled for Duplex in a year. AAA August 2015 was 4.1 cm. Pt denies abdominal or back pain.  Pt's wife states pt had a TIA in January 2015, as manifested by left arm numbness and left hand weakness.  In 2008 he had left arm weakness, then the left CEA was done.  He has some residual weakness in his left hand. Pt denies any history of speech difficulties or loss of vision.  Pt denies claudication symptoms with walking.  Pt states he saw ortho for swelling in his left ankle, wearing compression hose has decreased the ankle swelling.  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   Pt Diabetic: No Pt smoker: former smoker, quit in 1966  Pt meds include: Statin : Yes ASA: No Other anticoagulants/antiplatelets: Plavix   Past Medical History  Diagnosis Date  . Allergy   . Hyperlipidemia   . Prostate cancer   . TIA (transient ischemic attack)   . Diplopia   . Carotid artery stenosis   . AAA (abdominal aortic aneurysm)   . Stroke 04/18/13    some weakness left-returning strength  . Spondylisthesis 2015    Spine    Social History History  Substance Use Topics  . Smoking status: Former Smoker    Types: Cigarettes    Quit date: 03/27/1964  . Smokeless tobacco: Never Used     Comment: occ alcohol  . Alcohol Use: Yes     Comment: occasional    Family History Family History  Problem Relation Age of Onset  . Stroke Mother   . Heart disease Mother     After age 55  . Varicose Veins Mother   . Hypertension Mother   . Cancer Mother     Theadora Rama   . Stroke Father   . Heart attack Father    . Deep vein thrombosis Father   . Heart disease Brother     Surgical History Past Surgical History  Procedure Laterality Date  . Foot surgery       left Achilles Tendon Repair  . Eye surgery      Bilateral cataract   . Endarterectomy Right 04/27/2013    Procedure: ENDARTERECTOMY CAROTID;  Surgeon: Mal Misty, MD;  Location: [facility] OR;  Service: Vascular;  Laterality: Right;  . Carotid endarterectomy  Sept. 2008    Elective left carotid endarterectomy  . Carotid endarterectomy  fEB. 6, 2015    CE    Allergies  Allergen Reactions  . Sudafed [Pseudoephedrine Hcl]     Causes dizziness and nervousness  . Atorvastatin Other (See Comments)    Reaction: Patient's family reports "confusion" and would prefer crestor     Current Outpatient Prescriptions  Medication Sig Dispense Refill  . acetaminophen (TYLENOL) 500 MG tablet Take 1,000 mg by mouth every 4 (four) hours as needed for mild pain.    Marland Kitchen clopidogrel (PLAVIX) 75 MG tablet Take 1 tablet (75 mg total) by mouth daily. 30 tablet 11  . Fexofenadine HCl (ALLEGRA PO) Take 1 tablet by mouth every morning.    Marland Kitchen L-Methylfolate-B12-B6-B2 (CEREFOLIN) 08-20-48-5 MG TABS Take 1 tablet by mouth daily. [employer]  each 3  . Multiple Vitamins-Minerals (CENTRUM SILVER PO) Take 1 tablet by mouth daily.     . Omega-3 Fatty Acids (FISH OIL) 1000 MG CPDR Take 1,000 mg by mouth every morning.     . rosuvastatin (CRESTOR) 5 MG tablet Take 5 mg by mouth at bedtime.     No current facility-administered medications for this visit.    Review of Systems : See HPI for pertinent positives and negatives.  Physical Examination  Filed Vitals:   04/23/14 1112 04/23/14 1116  BP: 141/87 136/84  Pulse: 60 61  Resp:  16  Height:  5\' 10"  (1.778 m)  Weight:  180 lb (81.647 kg)  SpO2:  94%   Body mass index is 25.83 kg/(m^2).  General: WDWN male in NAD GAIT: normal Eyes: PERRLA Pulmonary:  Non-labored, CTAB, Negative  Rales, Negative rhonchi, & Negative  wheezing.  Cardiac: regular Rhythm,  Negative detected murmur.  VASCULAR EXAM Carotid Bruits Right Left   Negative Negative    Aorta is palpable. Radial pulses are 2+ palpable and equal.                                                                                                                            LE Pulses Right Left       FEMORAL   palpable   palpable        POPLITEAL  not palpable   not palpable       POSTERIOR TIBIAL   palpable   not  palpable        DORSALIS PEDIS      ANTERIOR TIBIAL  palpable   palpable     Gastrointestinal: soft, nontender, BS WNL, no r/g,  negative palpated masses.  Musculoskeletal: Negative muscle atrophy/wasting. M/S 4/5 throughout, Extremities without ischemic changes.  Neurologic: A&O X 3; Appropriate Affect ; SENSATION ;normal;  Speech is normal CN 2-12 intact, Pain and light touch intact in extremities, Motor exam as listed above.   Non-Invasive Vascular Imaging CAROTID DUPLEX 04/23/2014   CEREBROVASCULAR DUPLEX EVALUATION    INDICATION: Carotid artery disease    PREVIOUS INTERVENTION(S): Bilateral   carotid artery endarterectomy September, 2008 (left ), and April 27, 2013 (right)    DUPLEX EXAM: Carotid duplex    RIGHT  LEFT  Peak Systolic Velocities (cm/s) End Diastolic Velocities (cm/s) Plaque LOCATION Peak Systolic Velocities (cm/s) End Diastolic Velocities (cm/s) Plaque  124 28 - CCA PROXIMAL 80 20 HT  111 14 - CCA MID 118 23 HT  90 11 - CCA DISTAL 112 24 HT  78 16 - ECA 79 16 -  66 22 - ICA PROXIMAL 36 12 -  74 26 - ICA MID 57 21 -  65 22 - ICA DISTAL 56 20 -    N/A ICA / CCA Ratio (PSV) N/A  Antegrade Vertebral Flow Antegrade  914 Brachial Systolic Pressure (mmHg) 782  Triphasic Brachial Artery Waveforms Triphasic    Plaque Morphology:  HM =  Homogeneous, HT = Heterogeneous, CP = Calcific Plaque, SP = Smooth Plaque, IP = Irregular Plaque     ADDITIONAL FINDINGS:     IMPRESSION: 1. Patent bilateral carotid  endarterectomy sites without evidence for restenosis.    Compared to the previous exam:  No change      Assessment: [name] [name] is a 81 y.o. male who is s/p  bilateral carotid endarterectomies: September, 2008 (left ), and April 27, 2013 (right). He also has a small abdominal aortic aneurysm which is scheduled for Duplex in a year. AAA August 2015 was 4.1 cm. His last stroke was in January 2015. Today's carotid Duplex reveals patent bilateral carotid endarterectomy sites without evidence for restenosis.  Face to face time with patient was 25 minutes. Over 50% of this time was spent on counseling and coordination of care.   Plan: Follow-up in 1 year with Carotid Duplex and AAA Duplex.   I discussed in depth with the patient the nature of atherosclerosis, and emphasized the importance of maximal medical management including strict control of blood pressure, blood glucose, and lipid levels, obtaining regular exercise, and cessation of smoking.  The patient is aware that without maximal medical management the underlying atherosclerotic disease process will progress, limiting the benefit of any interventions. The patient was given information about stroke prevention and what symptoms should prompt the patient to seek immediate medical care. Thank you for allowing [place] to participate in this patient's care.  Clemon Chambers, RN, MSN, FNP-C [facility] Office: [phone]  Clinic Physician: Kellie Simmering  04/23/2014  11:21 AM

## 2014-04-25 ENCOUNTER — Ambulatory Visit: Payer: Medicare Other | Admitting: Neurology

## 2014-05-06 ENCOUNTER — Ambulatory Visit: Payer: Self-pay | Admitting: Neurology

## 2014-05-10 ENCOUNTER — Ambulatory Visit (INDEPENDENT_AMBULATORY_CARE_PROVIDER_SITE_OTHER): Payer: Medicare Other | Admitting: Neurology

## 2014-05-10 ENCOUNTER — Encounter: Payer: Self-pay | Admitting: Neurology

## 2014-05-10 VITALS — BP 115/73 | HR 101 | Ht 70.0 in | Wt 184.0 lb

## 2014-05-10 DIAGNOSIS — G3184 Mild cognitive impairment, so stated: Secondary | ICD-10-CM

## 2014-05-10 NOTE — Progress Notes (Signed)
PATIENT: [name] Landmark Alberteen Spindle.) [name] DOB: 02-Mar-1930  REASON FOR VISIT:  follow up for stroke, memory HISTORY FROM: patient and wife  HISTORY OF PRESENT ILLNESS: 51 year Caucasian male seen for first office followup visit from hospital consideration for stroke in January 2015. He presented with difficulty picking things up with his left hand as well as grip weakness while playing golf. He also subsequently diescribed, headache and some speech and language difficulties and double vision. His symptoms improved soon after arrival and hence he was not considered for thrombolysis. CT scan of the head showed an area of low attenuation in the right anterior limb internal capsule suggestive of a subacute lacunar infarct which was subsequently confirmed on MRI . 2D echo showed ejection fraction of 45-50% with diffuse hypokinesis. Carotid Doppler showed progressive 80-99% right ICA stenosis and 1-39% left ICA stenosis at the previous surgical site. Vascular surgery was consulted . Patient was started on aspirin. Patient was scheduled for and underwent elective right carotid endarterectomy by Dr Kellie Simmering and the procedure went well. Patient was placed on Plavix by me but after the surgery was changed to aspirin. He states his blood pressure is fine and is 103/65 today and it usually runs in the 120s at home. The patient's wife has noted that he has subacute decline in his memory as well as multitasking and cognitive abilities. She is concerned about this and would like evaluation. The patient himself denies this. He has not had an evaluation for these complaints before.   UPDATE 08/27/13 (LL):  Since last visit, patient has continued taking Cerefolin NAC for memory.  He does not notice any change in his memory, but states he knows his short term memory is not as good as it used to be. He is tolerating Plavix well with no signs of significant bleeding or bruising. His blood pressure is well controlled, it is 125/79 in the  office today.  He states he stays active, plays golf a couple days a week. Update 05/10/2014 he returns for follow-up accompanied by his wife. He states his memory difficulties are unchanged. He in fact is doing well and is still able to handle finances for the home and business. He is taking fish oil as well as Cerefolin everyday. The patient also remains on Plavix which is tolerating well without bleeding or bruising. He had lipid profile checked in December 2015 by primary physician which was fine. He also had follow-up carotid ultrasound done last week by Dr. Kellie Simmering which was okay as well. He exercises regularly and plays golf as well. He has no new complaints. He has not had any recurrent stroke or TIA symptoms.:  ROS :14 system review of systems is positive for runny nose, memory loss and all other systems negative.  ALLERGIES: Allergies  Allergen Reactions  . Sudafed [Pseudoephedrine Hcl]     Causes dizziness and nervousness  . Atorvastatin Other (See Comments)    Reaction: Patient's family reports "confusion" and would prefer crestor     HOME MEDICATIONS: Outpatient Prescriptions Prior to Visit  Medication Sig Dispense Refill  . acetaminophen (TYLENOL) 500 MG tablet Take 1,000 mg by mouth every 4 (four) hours as needed for mild pain.    Marland Kitchen clopidogrel (PLAVIX) 75 MG tablet Take 1 tablet (75 mg total) by mouth daily. 30 tablet 11  . Fexofenadine HCl (ALLEGRA PO) Take 1 tablet by mouth every morning.    Marland Kitchen L-Methylfolate-B12-B6-B2 (CEREFOLIN) 08-20-48-5 MG TABS Take 1 tablet by mouth daily. [place]  each 3  . Multiple Vitamins-Minerals (CENTRUM SILVER PO) Take 1 tablet by mouth daily.     . Omega-3 Fatty Acids (FISH OIL) 1000 MG CPDR Take 1,000 mg by mouth every morning.     . rosuvastatin (CRESTOR) 5 MG tablet Take 5 mg by mouth at bedtime.     No facility-administered medications prior to visit.     PHYSICAL EXAM  Filed Vitals:   05/10/14 1520  BP: 115/73  Pulse: 101  Height: 5\' 10"   (1.778 m)  Weight: 184 lb (83.462 kg)   Body mass index is 26.4 kg/(m^2). No exam data present   Physical Exam  General: well developed, well nourished elderly Caucasian male, seated, in no evident distress  Head: head normocephalic and atraumatic. Orohparynx benign  Neck: supple with soft right carotid Bruit. healed right carotid endarterectomy scar  Cardiovascular: regular rate and rhythm, no murmurs  Musculoskeletal: no deformity  Skin: no rash/petichiae  Vascular: Normal pulses all extremities   Neurologic Exam  Mental Status: Awake and fully alert. Oriented to place and time.Remote memory intact. Attention span, concentration and fund of knowledge appropriate. Mood and affect appropriate. MMSE 29/30 with deficits in delayed recall only, 0/3.  AFT 10.  GDS 0. CDT 4/4. Cranial Nerves: Fundoscopic exam not done.  Pupils equal, briskly reactive to light. Extraocular movements full without nystagmus. Visual fields full to confrontation. Hearing intact. Facial sensation intact. Face, tongue, palate moves normally and symmetrically.  Motor: Normal bulk and tone. Normal strength in all tested extremity muscles. Diminished fine finger movements on the left. Orbits right over left upper extremity.  Sensory: intact to light touch in all 4 extremities. Coordination: Rapid alternating movements normal in all extremities. Finger-to-nose and heel-to-shin performed accurately bilaterally.  Gait and Station: Arises from chair without difficulty. Stance is normal. Gait demonstrates normal stride length and balance.  Able to heel, toe and tandem walk with mild difficulty.  Reflexes: 1+ and symmetric.  DIAGNOSTIC DATA (LABS, IMAGING, TESTING) - I reviewed patient records, labs, notes, testing and imaging myself where available.  Lab Results  Component Value Date   HGBA1C 5.9* 04/19/2013   Lab Results  Component Value Date   [id number] 3716* 06/15/2013   Lab Results  Component Value Date   TSH  3.390 06/15/2013    ASSESSMENT AND PLAN 83 year with right posterior frontal infarct in January 2015 secondary to symptomatic right ICA stenosis for which he has undergone elective right carotid endarterectomy.  Multiple vascular risk factors of carotid stenosis, hyperlipidemia, peripheral vascular disease and history of TIA.  He also has pre-existing memory loss which is likely mild cognitive impairment.  PLAN:  I had a long discussion with the patient and wife regarding memory loss, mild cognitive impairment and risk of progression to early dementia.and also about his recent stroke, risk for recurrent stroke/TIAs, personally independently reviewed imaging studies and stroke evaluation results and answered questions.Continue Plavix 75 mg orally every day  for secondary stroke prevention and maintain strict control of hypertension with blood pressure goal below 130/90, diabetes with hemoglobin A1c goal below 6.5% and lipids with LDL cholesterol goal below 100 mg/dL. I also advised the patient to eat a healthy diet with plenty of whole grains, cereals, fruits and vegetables, exercise regularly and maintain ideal body weight .continue Cerefolin and fish oil for mild cognitive impairment as well as continue participation in activities like solving crossword puzzles, sudoku, playing bridge .Followup in the future with me in  6 months or call earlier  ifnecessary    No orders of the defined types were placed in this encounter.   Return in about 6 months (around 11/08/2014).  Antony Contras, MD  05/10/2014, 4:03 PM [facility] [address], [place], [place] [address] [phone]  Note: This document was prepared with digital dictation and possible smart phrase technology. Any transcriptional errors that result from this process are unintentional.

## 2014-05-10 NOTE — Patient Instructions (Addendum)
I had a long d/w patient about his recent stroke, risk for recurrent stroke/TIAs, personally independently reviewed imaging studies and stroke evaluation results and answered questions.Continue Plavix 75 mg orally every day  for secondary stroke prevention and maintain strict control of hypertension with blood pressure goal below 130/90, diabetes with hemoglobin A1c goal below 6.5% and lipids with LDL cholesterol goal below 100 mg/dL. I also advised the patient to eat a healthy diet with plenty of whole grains, cereals, fruits and vegetables, exercise regularly and maintain ideal body weight .continue Cerefolin and fish oil for mild cognitive impairment as well as continue participation in activities like solving crossword puzzles, sudoku, playing bridge .Followup in the future with me in  6 months or call earlier ifnecessary

## 2014-06-03 ENCOUNTER — Other Ambulatory Visit: Payer: Self-pay

## 2014-06-03 DIAGNOSIS — R413 Other amnesia: Secondary | ICD-10-CM

## 2014-06-03 DIAGNOSIS — G3184 Mild cognitive impairment, so stated: Secondary | ICD-10-CM

## 2014-06-03 MED ORDER — CLOPIDOGREL BISULFATE 75 MG PO TABS: 75.0000 mg | ORAL_TABLET | Freq: Every day | ORAL | Status: DC

## 2014-09-16 ENCOUNTER — Other Ambulatory Visit: Payer: Self-pay

## 2014-10-14 ENCOUNTER — Telehealth: Payer: Self-pay | Admitting: Neurology

## 2014-10-14 DIAGNOSIS — G3184 Mild cognitive impairment, so stated: Secondary | ICD-10-CM

## 2014-10-14 DIAGNOSIS — R413 Other amnesia: Secondary | ICD-10-CM

## 2014-10-14 MED ORDER — CEREFOLIN 6-1-50-5 MG PO TABS: 1.0000 | ORAL_TABLET | Freq: Every day | ORAL | Status: DC

## 2014-10-14 NOTE — Telephone Encounter (Signed)
Patients wife called requesting refill for L-Methylfolate-B12-B6-B2 (CEREFOLIN) 08-20-48-5 MG TABS to [facility] pharmacy. Phone # is [phone].

## 2014-10-14 NOTE — Telephone Encounter (Signed)
Rx has been sent.  Receipt confirmed by pharmacy.   

## 2014-11-05 ENCOUNTER — Encounter: Payer: Self-pay | Admitting: Neurology

## 2014-11-05 ENCOUNTER — Ambulatory Visit (INDEPENDENT_AMBULATORY_CARE_PROVIDER_SITE_OTHER): Payer: Medicare Other | Admitting: Neurology

## 2014-11-05 VITALS — BP 150/93 | HR 60 | Ht 69.0 in | Wt 178.0 lb

## 2014-11-05 DIAGNOSIS — G3184 Mild cognitive impairment, so stated: Secondary | ICD-10-CM

## 2014-11-05 NOTE — Patient Instructions (Signed)
I had a long d/w patient and wife about his remote stroke, risk for recurrent stroke/TIAs, personally independently reviewed imaging studies and stroke evaluation results and answered questions.Continue Plavix   for secondary stroke prevention and maintain strict control of hypertension with blood pressure goal below 130/90, diabetes with hemoglobin A1c goal below 6.5% and lipids with LDL cholesterol goal below 100 mg/dL. Continue follow-up with vascular surgery for his carotids ultrasounds. Continue Cerefolin for mild cognitive impairment as well as add Reservetarol 200 mg daily. Followup in the future with me in  6 months or call earlier if necessary Memory Compensation Strategies  1. Use "WARM" strategy.  W= write it down  A= associate it  R= repeat it  M= make a mental note  2.   You can keep a Social worker.  Use a 3-ring notebook with sections for the following: calendar, important names and phone numbers,  medications, doctors' names/phone numbers, lists/reminders, and a section to journal what you did  each day.   3.    Use a calendar to write appointments down.  4.    Write yourself a schedule for the day.  This can be placed on the calendar or in a separate section of the Memory Notebook.  Keeping a  regular schedule can help memory.  5.    Use medication organizer with sections for each day or morning/evening pills.  You may need help loading it  6.    Keep a basket, or pegboard by the door.  Place items that you need to take out with you in the basket or on the pegboard.  You may also want to  include a message board for reminders.  7.    Use sticky notes.  Place sticky notes with reminders in a place where the task is performed.  For example: " turn off the  stove" placed by the stove, "lock the door" placed on the door at eye level, " take your medications" on  the bathroom mirror or by the place where you normally take your medications.  8.    Use alarms/timers.  Use while  cooking to remind yourself to check on food or as a reminder to take your medicine, or as a  reminder to make a call, or as a reminder to perform another task, etc.

## 2014-11-05 NOTE — Progress Notes (Signed)
PATIENT: [name] [name] DOB: [birth year]  REASON FOR VISIT:  follow up for stroke, memory HISTORY FROM: patient and wife  HISTORY OF PRESENT ILLNESS: 24 year Caucasian male seen for first office followup visit from hospital consideration for stroke in January 2015. He presented with difficulty picking things up with his left hand as well as grip weakness while playing golf. He also subsequently diescribed, headache and some speech and language difficulties and double vision. His symptoms improved soon after arrival and hence he was not considered for thrombolysis. CT scan of the head showed an area of low attenuation in the right anterior limb internal capsule suggestive of a subacute lacunar infarct which was subsequently confirmed on MRI . 2D echo showed ejection fraction of 45-50% with diffuse hypokinesis. Carotid Doppler showed progressive 80-99% right ICA stenosis and 1-39% left ICA stenosis at the previous surgical site. Vascular surgery was consulted . Patient was started on aspirin. Patient was scheduled for and underwent elective right carotid endarterectomy by Dr Kellie Simmering and the procedure went well. Patient was placed on Plavix by me but after the surgery was changed to aspirin. He states his blood pressure is fine and is 103/65 today and it usually runs in the 120s at home. The patient's wife has noted that he has subacute decline in his memory as well as multitasking and cognitive abilities. She is concerned about this and would like evaluation. The patient himself denies this. He has not had an evaluation for these complaints before.   UPDATE 08/27/13 (LL):  Since last visit, patient has continued taking Cerefolin NAC for memory.  He does not notice any change in his memory, but states he knows his short term memory is not as good as it used to be. He is tolerating Plavix well with no signs of significant bleeding or bruising. His blood pressure is well controlled, it is 125/79 in the  office today.  He states he stays active, plays golf a couple days a week. Update 05/10/2014 he returns for follow-up accompanied by his wife. He states his memory difficulties are unchanged. He in fact is doing well and is still able to handle finances for the home and business. He is taking fish oil as well as Cerefolin everyday. The patient also remains on Plavix which is tolerating well without bleeding or bruising. He had lipid profile checked in December 2015 by primary physician which was fine. He also had follow-up carotid ultrasound done last week by Dr. Kellie Simmering which was okay as well. He exercises regularly and plays golf as well. He has no new complaints. He has not had any recurrent stroke or TIA symptoms. Update 11/05/2014: He returns for follow-up after last visit 6 months ago. He is accompanied by his wife. He states his short-term memory difficulties continue. He has occasional fecal times in judgment and multitasking. However overall his symptoms seem stable. He continues to take Cerefolin daily. He does participate in doing sudoku several times a week. He does play golf twice a week. He did have a follow-up carotid ultrasound checked in February and Dr. Evelena Leyden office and it was fine. He remains on Plavix which is tolerating well without bleeding or bruising. He has not had any recurrent stroke or TIA symptoms. He remains on Crestor without any myalgias or arthralgias. His blood pressure is elevated today in office at 150/93 but he blames this on white coat hypertension. He did have lipid profile checked 2 months ago by Dr. Laurann Montana and  it was fine. ROS :14 system review of systems is positive for  incontinence of bladder, allergies, memory loss and all other systems negative.  ALLERGIES: Allergies  Allergen Reactions  . Sudafed [Pseudoephedrine Hcl]     Causes dizziness and nervousness  . Atorvastatin Other (See Comments)    Reaction: Patient's family reports "confusion" and would prefer  crestor     HOME MEDICATIONS: Outpatient Prescriptions Prior to Visit  Medication Sig Dispense Refill  . acetaminophen (TYLENOL) 500 MG tablet Take 1,000 mg by mouth every 4 (four) hours as needed for mild pain.    Marland Kitchen clopidogrel (PLAVIX) 75 MG tablet Take 1 tablet (75 mg total) by mouth daily. 90 tablet 1  . L-Methylfolate-B12-B6-B2 (CEREFOLIN) 08-20-48-5 MG TABS Take 1 tablet by mouth daily. 90 each 1  . Multiple Vitamins-Minerals (CENTRUM SILVER PO) Take 1 tablet by mouth daily.     . Omega-3 Fatty Acids (FISH OIL) 1000 MG CPDR Take 1,000 mg by mouth every morning.     . rosuvastatin (CRESTOR) 5 MG tablet Take 5 mg by mouth at bedtime.    Marland Kitchen Fexofenadine HCl (ALLEGRA PO) Take 1 tablet by mouth every morning.     No facility-administered medications prior to visit.     PHYSICAL EXAM  Filed Vitals:   11/05/14 1046  BP: 150/93  Pulse: 60  Height: 5\' 9"  (1.753 m)  Weight: 178 lb (80.74 kg)   Body mass index is 26.27 kg/(m^2). No exam data present   Physical Exam  General: well developed, well nourished elderly Caucasian male, seated, in no evident distress  Head: head normocephalic and atraumatic.     Neck: supple with soft right carotid Bruit. healed right carotid endarterectomy scar  Cardiovascular: regular rate and rhythm, no murmurs  Musculoskeletal: no deformity  Skin: no rash/petichiae  Vascular: Normal pulses all extremities   Neurologic Exam  Mental Status: Awake and fully alert. Oriented to place and time.Diminished immediate recall.Remote memory intact. Attention span, concentration and fund of knowledge appropriate. Mood and affect appropriate. MMSE not done Cranial Nerves: Fundoscopic exam not done.  Pupils equal, briskly reactive to light. Extraocular movements full without nystagmus. Visual fields full to confrontation. Hearing intact. Facial sensation intact. Face, tongue, palate moves normally and symmetrically.  Motor: Normal bulk and tone. Normal strength in  all tested extremity muscles. Diminished fine finger movements on the left. Orbits right over left upper extremity.  Sensory: intact to light touch in all 4 extremities. Coordination: Rapid alternating movements normal in all extremities. Finger-to-nose and heel-to-shin performed accurately bilaterally.  Gait and Station: Arises from chair without difficulty. Stance is normal. Gait demonstrates normal stride length and balance.  Able to heel, toe and tandem walk with mild difficulty.  Reflexes: 1+ and symmetric.  DIAGNOSTIC DATA (LABS, IMAGING, TESTING) - I reviewed patient records, labs, notes, testing and imaging myself where available.  Lab Results  Component Value Date   HGBA1C 5.9* 04/19/2013   Lab Results  Component Value Date   [id number] 2831* 06/15/2013   Lab Results  Component Value Date   TSH 3.390 06/15/2013    ASSESSMENT AND PLAN 83 year with right posterior frontal infarct in January 2015 secondary to symptomatic right ICA stenosis for which he has undergone elective right carotid endarterectomy.  Multiple vascular risk factors of carotid stenosis, hyperlipidemia, peripheral vascular disease and history of TIA.  He also has pre-existing memory loss which is likely mild cognitive impairment which appears stable.  PLAN:   I had a long  d/w patient and wife about his remote stroke, risk for recurrent stroke/TIAs, personally independently reviewed imaging studies and stroke evaluation results and answered questions.Continue Plavix   for secondary stroke prevention and maintain strict control of hypertension with blood pressure goal below 130/90, diabetes with hemoglobin A1c goal below 6.5% and lipids with LDL cholesterol goal below 100 mg/dL. Continue follow-up with vascular surgery for his carotids ultrasounds. Continue Cerefolin for mild cognitive impairment as well as add Reservetarol 200 mg daily. Followup in the future with me in  6 months or call earlier if  necessary    No orders of the defined types were placed in this encounter.   Return in about 6 months (around 05/08/2015).  Antony Contras, MD  11/05/2014, 1:16 PM [facility] [address], [place], [place] [address] [phone]  Note: This document was prepared with digital dictation and possible smart phrase technology. Any transcriptional errors that result from this process are unintentional.

## 2014-12-03 ENCOUNTER — Telehealth: Payer: Self-pay | Admitting: Neurology

## 2014-12-03 DIAGNOSIS — R413 Other amnesia: Secondary | ICD-10-CM

## 2014-12-03 DIAGNOSIS — G3184 Mild cognitive impairment, so stated: Secondary | ICD-10-CM

## 2014-12-03 MED ORDER — CLOPIDOGREL BISULFATE 75 MG PO TABS: 75.0000 mg | ORAL_TABLET | Freq: Every day | ORAL | Status: DC

## 2014-12-03 NOTE — Telephone Encounter (Signed)
Pt's wife called about refill of clopidogrel (PLAVIX) 75 MG tablet. The rx was sent to pts pharmacy and they told the wife that Dr. Leonie Man did not want to refill under his name. Upon looking at last office note Dr. Leonie Man expressed that pt should continue the medication. Pt has 1 dose left for tomorrow 9/14. If this can not be done the wife would like to know what her options are. Pt's PCP told wife that it was not their office who would do that. Please call and advise [phone]

## 2014-12-03 NOTE — Telephone Encounter (Signed)
Refill has been sent.  Receipt confirmed by pharmacy.  I called back to advise.  Got no answer.  Left message.

## 2015-01-20 ENCOUNTER — Encounter: Payer: Self-pay | Admitting: Cardiovascular Disease

## 2015-01-20 NOTE — Progress Notes (Signed)
Patient ID: [name] [name], male   DOB: Jul 18, 1929, 81 y.o.   MRN: [id number] 81 y.o.  referred by Dr Kellie Simmering for vascular disease. Recent hospitalization for stroke History of AAA and left CEA Had TIA and just had RCE done  Denies history of CAD. Has not had stress test before Still golfing Moves a bit slow. No chest pain Mild exertional dsypnea.  Has had small hematoma post CEA Improving On asa and statin Denies claudication. Echo in hospital had no discrete RWMA but diffuse hypokinesis EF 45% Reviewed  FU myovue was non ischemic 05/27/13  EF 56%   Seen by Dr Kellie Simmering post CEA and 4.1 cm AAA  All stable  09/2013   Having some memory issues  Gets dehydrated on golf course  Urinary frequency due to prostate cancer and he does not like to drink Some post nasal drip and allergies     ROS: Denies fever, malais, weight loss, blurry vision, decreased visual acuity, cough, sputum, SOB, hemoptysis, pleuritic pain, palpitaitons, heartburn, abdominal pain, melena, lower extremity edema, claudication, or rash.  All other systems reviewed and negative  General: Affect appropriate Elderly male  HEENT: normal Neck supple with no adenopathy JVP normal Previous CEAls but  no bruits no thyromegaly Lungs clear with no wheezing and good diaphragmatic motion Heart:  S1/S2 no murmur, no rub, gallop or click PMI normal Abdominal aorta easily palpable and mildly enlarged no pain  no bruit.  No HSM or HJR Distal pulses intact with no bruits No edema Neuro non-focal Skin warm and dry No muscular weakness   Current Outpatient Prescriptions  Medication Sig Dispense Refill  . acetaminophen (TYLENOL) 500 MG tablet Take 1,000 mg by mouth every 4 (four) hours as needed for mild pain.    Marland Kitchen azelastine (ASTELIN) 0.1 % nasal spray Place 2 sprays into both nostrils 2 (two) times daily.  4  . cetirizine (ZYRTEC) 10 MG tablet Take 10 mg by mouth daily.    . clopidogrel (PLAVIX) 75 MG tablet Take 1 tablet (75 mg total) by mouth  daily. 90 tablet 1  . ipratropium (ATROVENT) 0.03 % nasal spray Place 2 sprays into both nostrils daily as needed. For sneezing  0  . L-Methylfolate-B12-B6-B2 (CEREFOLIN) 08-20-48-5 MG TABS Take 1 tablet by mouth daily. 90 each 1  . Multiple Vitamins-Minerals (CENTRUM SILVER PO) Take 1 tablet by mouth daily.     . Omega-3 Fatty Acids (FISH OIL) 1000 MG CPDR Take 1,000 mg by mouth every morning.     Marland Kitchen Resveratrol 250 MG CAPS Take 250 mg by mouth daily.    . rosuvastatin (CRESTOR) 5 MG tablet Take 5 mg by mouth at bedtime.     No current facility-administered medications for this visit.    Allergies  Sudafed and Atorvastatin  Electrocardiogram:  11/21/13  NSR normal ECG  01/22/15  SR rate 65 LVH no change   Assessment and Plan Carotid: right bruit no change f/u duplex Kellie Simmering AAA:  Not palpable f/u duplex Lawson 4.1 cm Chol:  Lab Results  Component Value Date   LDLCALC 62 04/19/2013   ENT:  Ok to try pseudofed occasionally Suggested mucinex as better drug.  On astelin recently by allergist    F/U with me in a year   Jenkins Rouge

## 2015-01-22 ENCOUNTER — Ambulatory Visit (INDEPENDENT_AMBULATORY_CARE_PROVIDER_SITE_OTHER): Payer: Medicare Other | Admitting: Cardiovascular Disease

## 2015-01-22 ENCOUNTER — Encounter: Payer: Self-pay | Admitting: Cardiovascular Disease

## 2015-01-22 VITALS — BP 130/72 | HR 65 | Ht 70.0 in | Wt 173.2 lb

## 2015-01-22 DIAGNOSIS — G3184 Mild cognitive impairment, so stated: Secondary | ICD-10-CM

## 2015-01-22 DIAGNOSIS — I2510 Atherosclerotic heart disease of native coronary artery without angina pectoris: Secondary | ICD-10-CM

## 2015-01-22 NOTE — Patient Instructions (Signed)

## 2015-04-29 ENCOUNTER — Encounter: Payer: Self-pay | Admitting: Family

## 2015-05-02 ENCOUNTER — Other Ambulatory Visit: Payer: Self-pay | Admitting: Neurology

## 2015-05-05 ENCOUNTER — Other Ambulatory Visit: Payer: Self-pay | Admitting: *Deleted

## 2015-05-05 ENCOUNTER — Encounter: Payer: Self-pay | Admitting: Cardiology

## 2015-05-05 DIAGNOSIS — I6523 Occlusion and stenosis of bilateral carotid arteries: Secondary | ICD-10-CM

## 2015-05-06 ENCOUNTER — Ambulatory Visit (HOSPITAL_COMMUNITY)
Admission: RE | Admit: 2015-05-06 | Discharge: 2015-05-06 | Disposition: A | Discharge status: Home / Self Care | Payer: Medicare Other | Source: Ambulatory Visit | Attending: Vascular Surgery | Admitting: Vascular Surgery

## 2015-05-06 ENCOUNTER — Other Ambulatory Visit: Payer: Self-pay

## 2015-05-06 ENCOUNTER — Ambulatory Visit (INDEPENDENT_AMBULATORY_CARE_PROVIDER_SITE_OTHER)
Admission: RE | Admit: 2015-05-06 | Discharge: 2015-05-06 | Disposition: A | Discharge status: Home / Self Care | Payer: Medicare Other | Source: Ambulatory Visit | Attending: Vascular Surgery | Admitting: Vascular Surgery

## 2015-05-06 ENCOUNTER — Ambulatory Visit (INDEPENDENT_AMBULATORY_CARE_PROVIDER_SITE_OTHER): Payer: Medicare Other | Admitting: Family

## 2015-05-06 ENCOUNTER — Encounter: Payer: Self-pay | Admitting: Family

## 2015-05-06 ENCOUNTER — Encounter: Payer: Self-pay | Admitting: Nurse Practitioner

## 2015-05-06 ENCOUNTER — Ambulatory Visit (INDEPENDENT_AMBULATORY_CARE_PROVIDER_SITE_OTHER): Payer: Medicare Other | Admitting: Nurse Practitioner

## 2015-05-06 VITALS — BP 169/91 | HR 62 | Ht 70.0 in | Wt 179.0 lb

## 2015-05-06 VITALS — BP 147/85 | HR 69 | Ht 70.0 in | Wt 184.8 lb

## 2015-05-06 DIAGNOSIS — Z8673 Personal history of transient ischemic attack (TIA), and cerebral infarction without residual deficits: Secondary | ICD-10-CM | POA: Insufficient documentation

## 2015-05-06 DIAGNOSIS — I714 Abdominal aortic aneurysm, without rupture, unspecified: Secondary | ICD-10-CM

## 2015-05-06 DIAGNOSIS — R413 Other amnesia: Secondary | ICD-10-CM

## 2015-05-06 DIAGNOSIS — R0989 Other specified symptoms and signs involving the circulatory and respiratory systems: Secondary | ICD-10-CM | POA: Diagnosis not present

## 2015-05-06 DIAGNOSIS — IMO0001 Reserved for inherently not codable concepts without codable children: Secondary | ICD-10-CM

## 2015-05-06 DIAGNOSIS — I6523 Occlusion and stenosis of bilateral carotid arteries: Secondary | ICD-10-CM | POA: Diagnosis not present

## 2015-05-06 DIAGNOSIS — G3184 Mild cognitive impairment, so stated: Secondary | ICD-10-CM | POA: Diagnosis not present

## 2015-05-06 DIAGNOSIS — R030 Elevated blood-pressure reading, without diagnosis of hypertension: Secondary | ICD-10-CM

## 2015-05-06 DIAGNOSIS — Z48812 Encounter for surgical aftercare following surgery on the circulatory system: Secondary | ICD-10-CM

## 2015-05-06 DIAGNOSIS — Z9889 Other specified postprocedural states: Secondary | ICD-10-CM

## 2015-05-06 MED ORDER — CEREFOLIN 6-1-50-5 MG PO TABS
1.0000 | ORAL_TABLET | Freq: Every day | ORAL | Status: DC
Start: 2015-05-06 — End: 2015-07-11

## 2015-05-06 MED ORDER — CLOPIDOGREL BISULFATE 75 MG PO TABS
75.0000 mg | ORAL_TABLET | Freq: Every day | ORAL | Status: DC
Start: 2015-05-06 — End: 2015-06-02

## 2015-05-06 NOTE — Progress Notes (Signed)
[facility] [place] HISTORY AND PHYSICAL   MRN : [id number]  History of Present Illness:   [name] [name] is a 82 y.o. male patient of Dr. Kellie Simmering who returns for continued followup regarding his bilateral carotid endarterectomies: September, 2008 (left ), and April 27, 2013 (right). He also has a small abdominal aortic aneurysm which we are monitoring.  AAA August 2015 was 4.1 cm. Pt denies abdominal or back pain.  Pt's wife states pt had a TIA in January 2015, as manifested by left arm numbness and left hand weakness.  In 2008 he had left arm weakness, then the left CEA was done.  He has some residual weakness in his left hand. Pt denies any history of speech difficulties or loss of vision.  Pt states his blood pressure is usually 130/70-72 in his other medical provider  Pt denies claudication symptoms with walking.  Pt states he saw ortho for swelling in his left ankle, wearing compression hose has decreased the ankle swelling.  The patient reports New Medical or Surgical History: pt states he has had a cough since at least August of 2016, denies dyspnea; has seen his allergist and PCP re this in 2016 Diagnosed in 2015 with spondolythesis as a result of remote back injury.   Pt Diabetic: No Pt smoker: former smoker, quit in 1966  Pt meds include: Statin : Yes ASA: No Other anticoagulants/antiplatelets: Plavix    Current Outpatient Prescriptions  Medication Sig Dispense Refill  . acetaminophen (TYLENOL) 500 MG tablet Take 1,000 mg by mouth every 4 (four) hours as needed for mild pain.    Marland Kitchen azelastine (ASTELIN) 0.1 % nasal spray Place 2 sprays into both nostrils 2 (two) times daily.  4  . cetirizine (ZYRTEC) 10 MG tablet Take 10 mg by mouth daily.    . clopidogrel (PLAVIX) 75 MG tablet Take 1 tablet (75 mg total) by mouth daily. 90 tablet 1  . ipratropium (ATROVENT) 0.03 % nasal spray Place 2 sprays into both nostrils daily as needed. For sneezing  0   . L-Methylfolate-B12-B6-B2 (CEREFOLIN) 08-20-48-5 MG TABS Take 1 tablet by mouth daily. 90 each 1  . Multiple Vitamins-Minerals (CENTRUM SILVER PO) Take 1 tablet by mouth daily.     . Omega-3 Fatty Acids (FISH OIL) 1000 MG CPDR Take 1,000 mg by mouth every morning.     Marland Kitchen Resveratrol 250 MG CAPS Take 250 mg by mouth daily.    . rosuvastatin (CRESTOR) 5 MG tablet Take 5 mg by mouth at bedtime.     No current facility-administered medications for this visit.    Past Medical History  Diagnosis Date  . Allergy   . Hyperlipidemia   . Prostate cancer ([place])   . TIA (transient ischemic attack)   . Diplopia   . Carotid artery stenosis   . AAA (abdominal aortic aneurysm) ([place])   . Stroke ([place]) 04/18/13    some weakness left-returning strength  . Spondylisthesis 2015    Spine    Social History Social History  Substance Use Topics  . Smoking status: Former Smoker    Types: Cigarettes    Quit date: 03/27/1964  . Smokeless tobacco: Never Used     Comment: occ alcohol  . Alcohol Use: 0.0 oz/week    0 Standard drinks or equivalent per week     Comment: occasional    Family History Family History  Problem Relation Age of Onset  . Stroke Mother   . Heart disease Mother  After age 51  . Varicose Veins Mother   . Hypertension Mother   . Cancer Mother     Theadora Rama   . Stroke Father   . Heart attack Father   . Deep vein thrombosis Father   . Heart disease Brother     Surgical History Past Surgical History  Procedure Laterality Date  . Foot surgery       left Achilles Tendon Repair  . Eye surgery      Bilateral cataract   . Endarterectomy Right 04/27/2013    Procedure: ENDARTERECTOMY CAROTID;  Surgeon: Mal Misty, MD;  Location: [facility] OR;  Service: Vascular;  Laterality: Right;  . Carotid endarterectomy  Sept. 2008    Elective left carotid endarterectomy  . Carotid endarterectomy  fEB. 6, 2015    CE    Allergies  Allergen Reactions  . Sudafed [Pseudoephedrine Hcl]      Causes dizziness and nervousness  . Atorvastatin Other (See Comments)    Reaction: Patient's family reports "confusion" and would prefer crestor     Current Outpatient Prescriptions  Medication Sig Dispense Refill  . acetaminophen (TYLENOL) 500 MG tablet Take 1,000 mg by mouth every 4 (four) hours as needed for mild pain.    Marland Kitchen azelastine (ASTELIN) 0.1 % nasal spray Place 2 sprays into both nostrils 2 (two) times daily.  4  . cetirizine (ZYRTEC) 10 MG tablet Take 10 mg by mouth daily.    . clopidogrel (PLAVIX) 75 MG tablet Take 1 tablet (75 mg total) by mouth daily. 90 tablet 1  . ipratropium (ATROVENT) 0.03 % nasal spray Place 2 sprays into both nostrils daily as needed. For sneezing  0  . L-Methylfolate-B12-B6-B2 (CEREFOLIN) 08-20-48-5 MG TABS Take 1 tablet by mouth daily. 90 each 1  . Multiple Vitamins-Minerals (CENTRUM SILVER PO) Take 1 tablet by mouth daily.     . Omega-3 Fatty Acids (FISH OIL) 1000 MG CPDR Take 1,000 mg by mouth every morning.     Marland Kitchen Resveratrol 250 MG CAPS Take 250 mg by mouth daily.    . rosuvastatin (CRESTOR) 5 MG tablet Take 5 mg by mouth at bedtime.     No current facility-administered medications for this visit.     REVIEW OF SYSTEMS: See HPI for pertinent positives and negatives.  Physical Examination Filed Vitals:   05/06/15 0856 05/06/15 0858 05/06/15 0902  BP: 173/98 174/89 169/91  Pulse: 62    Height: 5\' 10"  (1.778 m)    Weight: 179 lb (81.194 kg)    SpO2: 94%     Body mass index is 25.68 kg/(m^2).  General: WDWN male in NAD GAIT: normal Eyes: PERRLA Pulmonary: Non-labored, + scattered rales in posterior fields, no rhonchi or wheezing.  Cardiac: regular rhythm,no detected murmur.  VASCULAR EXAM Carotid Bruits Right Left   Negative Negative   Aorta is palpable. Radial pulses are 2+ palpable and equal.       LE Pulses Right Left   FEMORAL  palpable  palpable    POPLITEAL  4+palpable  2+ palpable   POSTERIOR TIBIAL  3+palpable  3+ palpable    DORSALIS PEDIS  ANTERIOR TIBIAL 3+palpable  3+palpable     Gastrointestinal: soft, nontender, BS WNL, no r/g, no palpable masses.  Musculoskeletal: No muscle atrophy/wasting. M/S 4/5 throughout, Extremities without ischemic changes.  Neurologic: A&O X 3; Appropriate Affect ; SENSATION ;normal;  Speech is normal CN 2-12 intact, Pain and light touch intact in extremities, Motor exam as listed above.  Non-Invasive Vascular Imaging (05/06/2015):  Carotid Duplex: Patent bilateral carotid endarterectomy sites with no hyperplasia or restenosis. No significant change from the last exam on 04/23/14.  AAA Duplex: Today: 4.22 cm x 4.17 cm 10/16/13: 4.10 cm x 3.94 cm  ASSESSMENT:  [name] [name] is a 82 y.o. male who is s/p bilateral carotid endarterectomies: September, 2008 (left ), and April 27, 2013 (right). He also has a small abdominal aortic aneurysm which we are monitoring.  He had a stroke or TIA in 2008 and 2015 with left arm weakness, no strokes or TIA's subsequently. He has some residual weakness in his left hand.   Today's carotid duplex suggests patent bilateral carotid endarterectomy sites with no hyperplasia or restenosis. No significant change from the last exam on 04/23/14.   Today's AAA duplex suggests stable compared to 1.5 years ago with the largest diameter today measuring 4.22 cm. He has no back or abdominal pain.   Prominent popliteal pulses, will evaluate for presence or absence of popliteal aneurysm with ultrasound evaluation in 2 weeks; he denies claudication symptoms, has strongly palpable pedal pulses.  I advised pt to see his PCP as soon as possible re his elevated blood pressure, cough, and lung crackles.   Face to face time with patient was 25 minutes. Over 50% of  this time was spent on counseling and coordination of care.    PLAN:   Return in 2 weeks for limited duplex of both popliteal arteries: prominent popliteal pulses.  Based on today's exam and non-invasive vascular lab results, the patient will follow up in 1 year for with the following tests: AAA duplex and carotid duplex. I discussed in depth with the patient the nature of atherosclerosis, and emphasized the importance of maximal medical management including strict control of blood pressure, blood glucose, and lipid levels, obtaining regular exercise, and cessation of smoking.  The patient is aware that without maximal medical management the underlying atherosclerotic disease process will progress, limiting the benefit of any interventions. Consideration for repair of AAA would be made when the size approaches 4.8 or 5.0 cm, growth > 1 cm/yr, and symptomatic status. The patient was given information about stroke prevention and what symptoms should prompt the patient to seek immediate medical care. The patient was given information about AAA including signs, symptoms, treatment,  what symptoms should prompt the patient to seek immediate medical care, and how to minimize the risk of enlargement and rupture of aneurysms.  Thank you for allowing [place] to participate in this patient's care.  Clemon Chambers, RN, MSN, FNP-C [facility] Office: [phone]  Clinic MD: Early  05/06/2015 9:27 AM

## 2015-05-06 NOTE — Patient Instructions (Signed)
Stroke Prevention Some medical conditions and behaviors are associated with an increased chance of having a stroke. You may prevent a stroke by making healthy choices and managing medical conditions. HOW CAN I REDUCE MY RISK OF HAVING A STROKE?   Stay physically active. Get at least 30 minutes of activity on most or all days.  Do not smoke. It may also be helpful to avoid exposure to secondhand smoke.  Limit alcohol use. Moderate alcohol use is considered to be:  No more than 2 drinks per day for men.  No more than 1 drink per day for nonpregnant women.  Eat healthy foods. This involves:  Eating 5 or more servings of fruits and vegetables a day.  Making dietary changes that address high blood pressure (hypertension), high cholesterol, diabetes, or obesity.  Manage your cholesterol levels.  Making food choices that are high in fiber and low in saturated fat, trans fat, and cholesterol may control cholesterol levels.  Take any prescribed medicines to control cholesterol as directed by your health care provider.  Manage your diabetes.  Controlling your carbohydrate and sugar intake is recommended to manage diabetes.  Take any prescribed medicines to control diabetes as directed by your health care provider.  Control your hypertension.  Making food choices that are low in salt (sodium), saturated fat, trans fat, and cholesterol is recommended to manage hypertension.  Ask your health care provider if you need treatment to lower your blood pressure. Take any prescribed medicines to control hypertension as directed by your health care provider.  If you are 80-39 years of age, have your blood pressure checked every 3-5 years. If you are 80 years of age or older, have your blood pressure checked every year.  Maintain a healthy weight.  Reducing calorie intake and making food choices that are low in sodium, saturated fat, trans fat, and cholesterol are recommended to manage  weight.  Stop drug abuse.  Avoid taking birth control pills.  Talk to your health care provider about the risks of taking birth control pills if you are over 80 years old, smoke, get migraines, or have ever had a blood clot.  Get evaluated for sleep disorders (sleep apnea).  Talk to your health care provider about getting a sleep evaluation if you snore a lot or have excessive sleepiness.  Take medicines only as directed by your health care provider.  For some people, aspirin or blood thinners (anticoagulants) are helpful in reducing the risk of forming abnormal blood clots that can lead to stroke. If you have the irregular heart rhythm of atrial fibrillation, you should be on a blood thinner unless there is a good reason you cannot take them.  Understand all your medicine instructions.  Make sure that other conditions (such as anemia or atherosclerosis) are addressed. SEEK IMMEDIATE MEDICAL CARE IF:   You have sudden weakness or numbness of the face, arm, or leg, especially on one side of the body.  Your face or eyelid droops to one side.  You have sudden confusion.  You have trouble speaking (aphasia) or understanding.  You have sudden trouble seeing in one or both eyes.  You have sudden trouble walking.  You have dizziness.  You have a loss of balance or coordination.  You have a sudden, severe headache with no known cause.  You have new chest pain or an irregular heartbeat. Any of these symptoms may represent a serious problem that is an emergency. Do not wait to see if the symptoms will   go away. Get medical help at once. Call your local emergency services (911 in [place] Do not drive yourself to the hospital.   This information is not intended to replace advice given to you by your health care provider. Make sure you discuss any questions you have with your health care provider.   Document Released: 04/15/2004 Document Revised: 03/29/2014 Document Reviewed:  09/08/2012 Elsevier Interactive Patient Education 2016 Elsevier Inc[employer]    Abdominal Aortic Aneurysm An aneurysm is a weakened or damaged part of an artery wall that bulges from the normal force of blood pumping through the body. An abdominal aortic aneurysm is an aneurysm that occurs in the lower part of the aorta, the main artery of the body.  The major concern with an abdominal aortic aneurysm is that it can enlarge and burst (rupture) or blood can flow between the layers of the wall of the aorta through a tear (aorticdissection). Both of these conditions can cause bleeding inside the body and can be life threatening unless diagnosed and treated promptly. CAUSES  The exact cause of an abdominal aortic aneurysm is unknown. Some contributing factors are:   A hardening of the arteries caused by the buildup of fat and other substances in the lining of a blood vessel (arteriosclerosis).  Inflammation of the walls of an artery (arteritis).   Connective tissue diseases, such as Marfan syndrome.   Abdominal trauma.   An infection, such as syphilis or staphylococcus, in the wall of the aorta (infectious aortitis) caused by bacteria. RISK FACTORS  Risk factors that contribute to an abdominal aortic aneurysm may include:  Age older than 80 years.   High blood pressure (hypertension).  Male gender.  Ethnicity (white race).  Obesity.  Family history of aneurysm (first degree relatives only).  Tobacco use. PREVENTION  The following healthy lifestyle habits may help decrease your risk of abdominal aortic aneurysm:  Quitting smoking. Smoking can raise your blood pressure and cause arteriosclerosis.  Limiting or avoiding alcohol.  Keeping your blood pressure, blood sugar level, and cholesterol levels within normal limits.  Decreasing your salt intake. In somepeople, too much salt can raise blood pressure and increase your risk of abdominal aortic aneurysm.  Eating a diet low in  saturated fats and cholesterol.  Increasing your fiber intake by including whole grains, vegetables, and fruits in your diet. Eating these foods may help lower blood pressure.  Maintaining a healthy weight.  Staying physically active and exercising regularly. SYMPTOMS  The symptoms of abdominal aortic aneurysm may vary depending on the size and rate of growth of the aneurysm.Most grow slowly and do not have any symptoms. When symptoms do occur, they may include:  Pain (abdomen, side, lower back, or groin). The pain may vary in intensity. A sudden onset of severe pain may indicate that the aneurysm has ruptured.  Feeling full after eating only small amounts of food.  Nausea or vomiting or both.  Feeling a pulsating lump in the abdomen.  Feeling faint or passing out. DIAGNOSIS  Since most unruptured abdominal aortic aneurysms have no symptoms, they are often discovered during diagnostic exams for other conditions. An aneurysm may be found during the following procedures:  Ultrasonography (A one-time screening for abdominal aortic aneurysm by ultrasonography is also recommended for all men aged 23-75 years who have ever smoked).  X-ray exams.  A computed tomography (CT).  Magnetic resonance imaging (MRI).  Angiography or arteriography. TREATMENT  Treatment of an abdominal aortic aneurysm depends on the size of your  your aneurysm, your age, and risk factors for rupture. Medication to control blood pressure and pain may be used to manage aneurysms smaller than 6 cm. Regular monitoring for enlargement may be recommended by your caregiver if:  The aneurysm is 3-4 cm in size (an annual ultrasonography may be recommended).  The aneurysm is 4-4.5 cm in size (an ultrasonography every 6 months may be recommended).  The aneurysm is larger than 4.5 cm in size (your caregiver may ask that you be examined by a vascular surgeon). If your aneurysm is larger than 6 cm, surgical repair may be  recommended. There are two main methods for repair of an aneurysm:   Endovascular repair (a minimally invasive surgery). This is done most often.  Open repair. This method is used if an endovascular repair is not possible.   This information is not intended to replace advice given to you by your health care provider. Make sure you discuss any questions you have with your health care provider.   Document Released: 12/16/2004 Document Revised: 07/03/2012 Document Reviewed: 04/07/2012 Elsevier Interactive Patient Education 2016 Elsevier Inc.  

## 2015-05-06 NOTE — Progress Notes (Signed)
I agree with the above plan 

## 2015-05-06 NOTE — Patient Instructions (Addendum)
Continue Plavix for secondary stroke prevention will refill control of hypertension with blood pressure goal below 130/80   hemoglobin A1c goal below 6.5%  lipids with LDL cholesterol goal below 100 mg/dL.  Continue follow-up with vascular surgery for his carotids ultrasounds.Dr. Kellie Simmering  Continue Cerefolin for mild cognitive impairment as well as add Reservetarol 250 mg daily.  Followup in the future in 6 months or call earlier if necessary

## 2015-05-06 NOTE — Progress Notes (Signed)
[facility]  PATIENT: [name] [name] DOB: May 12, 1929   REASON FOR VISIT: Follow-up for mild cognitive impairment history of stroke January 2015 HISTORY FROM: Patient and wife [name] [name]    HISTORY OF PRESENT ILLNESS: HISTORY 24 year Caucasian male seen for first office followup visit from hospital consideration for stroke in January 2015. He presented with difficulty picking things up with his left hand as well as grip weakness while playing golf. He also subsequently diescribed, headache and some speech and language difficulties and double vision. His symptoms improved soon after arrival and hence he was not considered for thrombolysis. CT scan of the head showed an area of low attenuation in the right anterior limb internal capsule suggestive of a subacute lacunar infarct which was subsequently confirmed on MRI . 2D echo showed ejection fraction of 45-50% with diffuse hypokinesis. Carotid Doppler showed progressive 80-99% right ICA stenosis and 1-39% left ICA stenosis at the previous surgical site. Vascular surgery was consulted . Patient was started on aspirin. Patient was scheduled for and underwent elective right carotid endarterectomy by Dr Kellie Simmering and the procedure went well. Patient was placed on Plavix by me but after the surgery was changed to aspirin. He states his blood pressure is fine and is 103/65 today and it usually runs in the 120s at home. The patient's wife has noted that he has subacute decline in his memory as well as multitasking and cognitive abilities. She is concerned about this and would like evaluation. The patient himself denies this. He has not had an evaluation for these complaints before.   UPDATE 08/27/13 (LL): Since last visit, patient has continued taking Cerefolin NAC for memory. He does not notice any change in his memory, but states he knows his short term memory is not as good as it used to be. He is tolerating Plavix well with no signs of significant  bleeding or bruising. His blood pressure is well controlled, it is 125/79 in the office today. He states he stays active, plays golf a couple days a week. Update 05/10/2014 PShe returns for follow-up accompanied by his wife. He states his memory difficulties are unchanged. He in fact is doing well and is still able to handle finances for the home and business. He is taking fish oil as well as Cerefolin everyday. The patient also remains on Plavix which is tolerating well without bleeding or bruising. He had lipid profile checked in December 2015 by primary physician which was fine. He also had follow-up carotid ultrasound done last week by Dr. Kellie Simmering which was okay as well. He exercises regularly and plays golf as well. He has no new complaints. He has not had any recurrent stroke or TIA symptoms. Update 11/05/2014:PS He returns for follow-up after last visit 6 months ago. He is accompanied by his wife. He states his short-term memory difficulties continue. He has occasional fecal times in judgment and multitasking. However overall his symptoms seem stable. He continues to take Cerefolin daily. He does participate in doing sudoku several times a week. He does play golf twice a week. He did have a follow-up carotid ultrasound checked in February and Dr. Evelena Leyden office and it was fine. He remains on Plavix which is tolerating well without bleeding or bruising. He has not had any recurrent stroke or TIA symptoms. He remains on Crestor without any myalgias or arthralgias. His blood pressure is elevated today in office at 150/93 but he blames this on white coat hypertension. He did have lipid profile checked  2 months ago by Dr. Laurann Montana and it was fine.  UPDATE 05/06/2015 Mr. Childree, 82 year old male returns for follow-up. He is accompanied by his wife [name] [name]. He has not had further stroke or TIA symptoms since last seen. He is currently on Plavix with minimal bruising. His memory has not progressed since last seen. He  continues to play golf several times a week and participates in card games, and sudoku. He remains on Cerefolin. His lipids are followed by primary care and he remains on Crestor without side effects. Carotid Dopplers followed yearly by Dr. Kellie Simmering. He returns for reevaluation   REVIEW OF SYSTEMS: Full 14 system review of systems performed and notable only for those listed, all others are neg:  Constitutional: neg  Cardiovascular: neg Ear/Nose/Throat: neg  Skin: neg Eyes: neg Respiratory: Cough ,  Gastroitestinal: Urinary frequency  Hematology/Lymphatic: neg  Endocrine: neg Musculoskeletal:neg Allergy/Immunology: neg Neurological: neg Psychiatric: neg Sleep : neg   ALLERGIES: Allergies  Allergen Reactions  . Sudafed [Pseudoephedrine Hcl]     Causes dizziness and nervousness  . Atorvastatin Other (See Comments)    Reaction: Patient's family reports "confusion" and would prefer crestor     HOME MEDICATIONS: Outpatient Prescriptions Prior to Visit  Medication Sig Dispense Refill  . acetaminophen (TYLENOL) 500 MG tablet Take 1,000 mg by mouth every 4 (four) hours as needed for mild pain.    Marland Kitchen azelastine (ASTELIN) 0.1 % nasal spray Place 2 sprays into both nostrils 2 (two) times daily.  4  . cetirizine (ZYRTEC) 10 MG tablet Take 10 mg by mouth daily.    . clopidogrel (PLAVIX) 75 MG tablet Take 1 tablet (75 mg total) by mouth daily. 90 tablet 1  . ipratropium (ATROVENT) 0.03 % nasal spray Place 2 sprays into both nostrils daily as needed. For sneezing  0  . L-Methylfolate-B12-B6-B2 (CEREFOLIN) 08-20-48-5 MG TABS Take 1 tablet by mouth daily. 90 each 3  . Multiple Vitamins-Minerals (CENTRUM SILVER PO) Take 1 tablet by mouth daily.     . Omega-3 Fatty Acids (FISH OIL) 1000 MG CPDR Take 1,000 mg by mouth every morning.     Marland Kitchen Resveratrol 250 MG CAPS Take 250 mg by mouth daily.    . rosuvastatin (CRESTOR) 5 MG tablet Take 5 mg by mouth at bedtime.    Marland Kitchen L-Methylfolate-B12-B6-B2 (CEREFOLIN)  08-20-48-5 MG TABS Take 1 tablet by mouth daily. 90 each 1   No facility-administered medications prior to visit.    PAST MEDICAL HISTORY: Past Medical History  Diagnosis Date  . Allergy   . Hyperlipidemia   . Prostate cancer ([place])   . TIA (transient ischemic attack)   . Diplopia   . Carotid artery stenosis   . AAA (abdominal aortic aneurysm) ([place])   . Stroke ([place]) 04/18/13    some weakness left-returning strength  . Spondylisthesis 2015    Spine    PAST SURGICAL HISTORY: Past Surgical History  Procedure Laterality Date  . Foot surgery       left Achilles Tendon Repair  . Eye surgery      Bilateral cataract   . Endarterectomy Right 04/27/2013    Procedure: ENDARTERECTOMY CAROTID;  Surgeon: Mal Misty, MD;  [facility];  Service: Vascular;  Laterality: Right;  . Carotid endarterectomy  Sept. 2008    Elective left carotid endarterectomy  . Carotid endarterectomy  fEB. 6, 2015    CE    FAMILY HISTORY: Family History  Problem Relation Age of Onset  . Stroke Mother   .  Heart disease Mother     After age 43  . Varicose Veins Mother   . Hypertension Mother   . Cancer Mother     Theadora Rama   . Stroke Father   . Heart attack Father   . Deep vein thrombosis Father   . Heart disease Brother     SOCIAL HISTORY: Social History   Social History  . Marital Status: Married    Spouse Name: lee  . Number of Children: 2  . Years of Education: college   Occupational History  . retired    Social History Main Topics  . Smoking status: Former Smoker    Types: Cigarettes    Quit date: 03/27/1964  . Smokeless tobacco: Never Used     Comment: occ alcohol  . Alcohol Use: 0.0 oz/week    0 Standard drinks or equivalent per week     Comment: occasional  . Drug Use: No  . Sexual Activity: Not on file   Other Topics Concern  . Not on file   Social History Narrative     PHYSICAL EXAM  Filed Vitals:   05/06/15 1345  BP: 147/85  Pulse: 69  Height: 5\' 10"  (1.778  m)  Weight: 184 lb 12.8 oz (83.825 kg)   Body mass index is 26.52 kg/(m^2). General: well developed, well nourished elderly Caucasian male, seated, in no evident distress  Head: head normocephalic and atraumatic.  Neck: supple with soft right carotid Bruit. healed right carotid endarterectomy scar  Cardiovascular: regular rate and rhythm, no murmurs Lungs clear to auscultation  Musculoskeletal: no deformity  Skin: no rash/petichiae  Vascular: Normal pulses all extremities   Neurologic Exam  Mental Status: Awake and fully alert. Oriented to place and time.Diminished immediate recall.Remote memory intact. Attention span, concentration and fund of knowledge appropriate. Mood and affect appropriate. MMSE not done Cranial Nerves: Fundoscopic exam not done. Pupils equal, briskly reactive to light. Extraocular movements full without nystagmus. Visual fields full to confrontation. Hearing intact. Facial sensation intact. Face, tongue, palate moves normally and symmetrically.  Motor: Normal bulk and tone. Normal strength in all tested extremity muscles. Diminished fine finger movements on the left. Orbits right over left upper extremity.  Sensory: intact to light touch , pinprick and vibratory in all 4 extremities. Coordination: Rapid alternating movements normal in all extremities. Finger-to-nose and heel-to-shin performed accurately bilaterally.  Gait and Station: Arises from chair without difficulty. Stance is normal. Gait demonstrates normal stride length and balance. Able to heel, toe and tandem walk with mild difficulty.  Reflexes: 1+ and symmetric.   DIAGNOSTIC DATA (LABS, IMAGING, TESTING) - ASSESSMENT AND PLAN 85 year with right posterior frontal infarct in January 2015 secondary to symptomatic right ICA stenosis for which he has undergone elective right carotid endarterectomy. Multiple vascular risk factors of carotid stenosis, hyperlipidemia, peripheral vascular disease and  history of TIA. He also has pre-existing memory loss which is likely mild cognitive impairment which appears stable.  Continue Plavix for secondary stroke prevention will refill control of hypertension with blood pressure goal below 130/80   hemoglobin A1c goal below 6.5%  lipids with LDL cholesterol goal below 100 mg/dL.  Continue follow-up with vascular surgery for his carotid ultrasounds.Dr. Kellie Simmering  Continue Cerefolin for mild cognitive impairment as well as  Reservetarol 250 mg daily.  Followup in the future in 6 months or call earlier if necessary Dennie Bible, [facility], [facility], [facility] [address] [place], [place] [address] [phone]

## 2015-05-07 NOTE — Addendum Note (Signed)
Addended by: Mena Goes on: 05/07/2015 10:25 AM   Modules accepted: Orders

## 2015-05-09 ENCOUNTER — Encounter: Payer: Self-pay | Admitting: Family

## 2015-05-14 ENCOUNTER — Encounter: Payer: Self-pay | Admitting: Family

## 2015-05-14 ENCOUNTER — Ambulatory Visit (HOSPITAL_COMMUNITY)
Admission: RE | Admit: 2015-05-14 | Discharge: 2015-05-14 | Disposition: A | Discharge status: Home / Self Care | Payer: Medicare Other | Source: Ambulatory Visit | Attending: Family | Admitting: Family

## 2015-05-14 ENCOUNTER — Ambulatory Visit (INDEPENDENT_AMBULATORY_CARE_PROVIDER_SITE_OTHER): Payer: Medicare Other | Admitting: Family

## 2015-05-14 VITALS — BP 154/84 | HR 64 | Temp 96.8°F | Resp 16 | Ht 70.0 in | Wt 183.0 lb

## 2015-05-14 DIAGNOSIS — I6523 Occlusion and stenosis of bilateral carotid arteries: Secondary | ICD-10-CM | POA: Diagnosis not present

## 2015-05-14 DIAGNOSIS — E785 Hyperlipidemia, unspecified: Secondary | ICD-10-CM | POA: Diagnosis not present

## 2015-05-14 DIAGNOSIS — I714 Abdominal aortic aneurysm, without rupture, unspecified: Secondary | ICD-10-CM

## 2015-05-14 DIAGNOSIS — R0989 Other specified symptoms and signs involving the circulatory and respiratory systems: Secondary | ICD-10-CM | POA: Insufficient documentation

## 2015-05-14 DIAGNOSIS — R030 Elevated blood-pressure reading, without diagnosis of hypertension: Secondary | ICD-10-CM | POA: Diagnosis not present

## 2015-05-14 DIAGNOSIS — IMO0001 Reserved for inherently not codable concepts without codable children: Secondary | ICD-10-CM

## 2015-05-14 NOTE — Progress Notes (Signed)
Filed Vitals:   05/14/15 1209 05/14/15 1216  BP: 166/88 154/84  Pulse: 65 64  Temp: 96.8 F (36 C)   TempSrc: Oral   Resp: 16   Height: 5\' 10"  (1.778 m)   Weight: 183 lb (83.008 kg)   SpO2: 96%

## 2015-05-14 NOTE — Progress Notes (Signed)
[facility] [place] HISTORY AND PHYSICAL -PAD  History of Present Illness [name] [name] is a 82 y.o. male patient of Dr. Kellie Simmering who returns for bilateral popliteal artery duplex evaluation of prominent popliteal arteries.  I evaluated him on 05/06/15 on followup regarding his bilateral carotid endarterectomies: September, 2008 (left ), and April 27, 2013 (right). He also has a small abdominal aortic aneurysm which we are monitoring.  AAA August 2015 was 4.1 cm. Pt denies abdominal or back pain.  Pt's wife states pt had a TIA in January 2015, as manifested by left arm numbness and left hand weakness.  In 2008 he had left arm weakness, then the left CEA was done.  He has some residual weakness in his left hand. Pt denies any history of speech difficulties or loss of vision.  Pt states his blood pressure is usually 130/70-72 in his other medical provider  Pt denies claudication symptoms with walking.  Pt states he saw ortho for swelling in his left ankle, wearing compression hose has decreased the ankle swelling.  The patient reports New Medical or Surgical History: pt states he has had a cough since at least August of 2016, denies dyspnea; has seen his allergist and PCP re this in 2016 Diagnosed in 2015 with spondolythesis as a result of remote back injury.   Pt Diabetic: No Pt smoker: former smoker, quit in 1966  Pt meds include: Statin : Yes ASA: No Other anticoagulants/antiplatelets: Plavix     Past Medical History  Diagnosis Date  . Allergy   . Hyperlipidemia   . Prostate cancer ([place])   . TIA (transient ischemic attack)   . Diplopia   . Carotid artery stenosis   . AAA (abdominal aortic aneurysm) ([place])   . Stroke ([place]) 04/18/13    some weakness left-returning strength  . Spondylisthesis 2015    Spine    Social History Social History  Substance Use Topics  . Smoking status: Former Smoker    Types: Cigarettes    Quit date: 03/27/1964  .  Smokeless tobacco: Never Used     Comment: occ alcohol  . Alcohol Use: 0.0 oz/week    0 Standard drinks or equivalent per week     Comment: occasional    Family History Family History  Problem Relation Age of Onset  . Stroke Mother   . Heart disease Mother     After age 83  . Varicose Veins Mother   . Hypertension Mother   . Cancer Mother     Theadora Rama   . Stroke Father   . Heart attack Father   . Deep vein thrombosis Father   . Heart disease Brother     Past Surgical History  Procedure Laterality Date  . Foot surgery       left Achilles Tendon Repair  . Eye surgery      Bilateral cataract   . Endarterectomy Right 04/27/2013    Procedure: ENDARTERECTOMY CAROTID;  Surgeon: Mal Misty, MD;  Location: [facility] OR;  Service: Vascular;  Laterality: Right;  . Carotid endarterectomy  Sept. 2008    Elective left carotid endarterectomy  . Carotid endarterectomy  fEB. 6, 2015    CE    Allergies  Allergen Reactions  . Sudafed [Pseudoephedrine Hcl]     Causes dizziness and nervousness  . Atorvastatin Other (See Comments)    Reaction: Patient's family reports "confusion" and would prefer crestor     Current Outpatient Prescriptions  Medication Sig Dispense Refill  .  acetaminophen (TYLENOL) 500 MG tablet Take 1,000 mg by mouth every 4 (four) hours as needed for mild pain.    Marland Kitchen azelastine (ASTELIN) 0.1 % nasal spray Place 2 sprays into both nostrils 2 (two) times daily.  4  . cetirizine (ZYRTEC) 10 MG tablet Take 10 mg by mouth daily.    . clopidogrel (PLAVIX) 75 MG tablet Take 1 tablet (75 mg total) by mouth daily. 90 tablet 3  . ipratropium (ATROVENT) 0.03 % nasal spray Place 2 sprays into both nostrils daily as needed. For sneezing  0  . L-Methylfolate-B12-B6-B2 (CEREFOLIN) 08-20-48-5 MG TABS Take 1 tablet by mouth daily. 90 each 3  . Multiple Vitamins-Minerals (CENTRUM SILVER PO) Take 1 tablet by mouth daily.     . Omega-3 Fatty Acids (FISH OIL) 1000 MG CPDR Take 1,000 mg by mouth  every morning.     Marland Kitchen Resveratrol 250 MG CAPS Take 250 mg by mouth daily.    . rosuvastatin (CRESTOR) 5 MG tablet Take 5 mg by mouth at bedtime.     No current facility-administered medications for this visit.    ROS: See HPI for pertinent positives and negatives.   Physical Examination  Filed Vitals:   05/14/15 1209 05/14/15 1216  BP: 166/88 154/84  Pulse: 65 64  Temp: 96.8 F (36 C)   TempSrc: Oral   Resp: 16   Height: 5\' 10"  (1.778 m)   Weight: 183 lb (83.008 kg)   SpO2: 96%    Body mass index is 26.26 kg/(m^2).  General: WDWN male in NAD GAIT: normal Eyes: PERRLA Pulmonary: Non-labored, + rales in both lung bases, left more so than right, no rhonchi or wheezing.  Cardiac: regular rhythm,no detected murmur.  VASCULAR EXAM Carotid Bruits Right Left   Negative Negative   Aorta is palpable. Radial pulses are 2+ palpable and equal.      LE Pulses Right Left   FEMORAL  palpable  palpable    POPLITEAL 4+palpable  2+ palpable   POSTERIOR TIBIAL  3+palpable  3+ palpable    DORSALIS PEDIS  ANTERIOR TIBIAL 3+palpable  3+palpable     Gastrointestinal: soft, nontender, BS WNL, no r/g, no palpable masses.  Musculoskeletal: No muscle atrophy/wasting. M/S 4/5 throughout, Extremities without ischemic changes.  Neurologic: A&O X 3; Appropriate Affect ; SENSATION ;normal;  Speech is normal CN 2-12 intact, Pain and light touch intact in extremities, Motor exam as listed above.                Non-Invasive Vascular Imaging: DATE: 05/14/2015  Lower Extremity Limited Arterial Duplex: Triphasic waveforms throughout the bilateral popliteal arteries with no significant stenosis noted. No evidence of aneurysmal dilatation of the bilateral popliteal arteries.    ASSESSMENT: [name] [name] is a 82 y.o. male who has prominent bilateral popliteal pulses; popliteal artery duplex today indicates no evidence of aneurysmal dilatation of the bilateral popliteal arteries. He is also s/p bilateral carotid endarterectomies: September, 2008 (left ), and April 27, 2013 (right). He also has a small abdominal aortic aneurysm which we are monitoring.  He had a stroke or TIA in 2008 and 2015 with left arm weakness, no strokes or TIA's subsequently. He has some residual weakness in his left hand.   05/06/15 carotid duplex suggests patent bilateral carotid endarterectomy sites with no hyperplasia or restenosis. No significant change from the last exam on 04/23/14.   05/06/15 AAA duplex suggests stable compared to 1.5 years ago with the largest diameter today measuring 4.22 cm. He has  no back or abdominal pain.  I advised pt to see his PCP as soon as possible re his elevated blood pressure, cough, and lung crackles which were present on evaluation last week.     PLAN:  Based on the patient's vascular studies and examination, pt will return to clinic in 1 year as already scheduled for with the following tests: AAA duplex and carotid duplex.  I discussed in depth with the patient the nature of atherosclerosis, and emphasized the importance of maximal medical management including strict control of blood pressure, blood glucose, and lipid levels, obtaining regular exercise, and continued cessation of smoking.  The patient is aware that without maximal medical management the underlying atherosclerotic disease process will progress, limiting the benefit of any interventions.  The patient was given information about PAD including signs, symptoms, treatment, what symptoms should prompt the patient to seek immediate medical care, and risk reduction measures to take.  Clemon Chambers, RN, MSN, FNP-C [facility] Phone: [phone]  Clinic  MD: Scot Dock  05/14/2015 12:39 PM

## 2015-05-21 ENCOUNTER — Ambulatory Visit
Admission: RE | Admit: 2015-05-21 | Discharge: 2015-05-21 | Disposition: A | Discharge status: Home / Self Care | Payer: Medicare Other | Source: Ambulatory Visit | Attending: Family Medicine | Admitting: Family Medicine

## 2015-05-21 ENCOUNTER — Other Ambulatory Visit: Payer: Self-pay | Admitting: Family Medicine

## 2015-05-21 DIAGNOSIS — R0989 Other specified symptoms and signs involving the circulatory and respiratory systems: Secondary | ICD-10-CM

## 2015-05-21 DIAGNOSIS — R05 Cough: Secondary | ICD-10-CM | POA: Diagnosis not present

## 2015-05-21 DIAGNOSIS — L600 Ingrowing nail: Secondary | ICD-10-CM | POA: Diagnosis not present

## 2015-05-21 DIAGNOSIS — R030 Elevated blood-pressure reading, without diagnosis of hypertension: Secondary | ICD-10-CM | POA: Diagnosis not present

## 2015-05-22 DIAGNOSIS — L03032 Cellulitis of left toe: Secondary | ICD-10-CM | POA: Diagnosis not present

## 2015-05-22 DIAGNOSIS — L02612 Cutaneous abscess of left foot: Secondary | ICD-10-CM | POA: Diagnosis not present

## 2015-05-22 DIAGNOSIS — L602 Onychogryphosis: Secondary | ICD-10-CM | POA: Diagnosis not present

## 2015-05-22 DIAGNOSIS — M79675 Pain in left toe(s): Secondary | ICD-10-CM | POA: Diagnosis not present

## 2015-05-23 ENCOUNTER — Other Ambulatory Visit (HOSPITAL_COMMUNITY): Payer: Self-pay | Admitting: Respiratory Therapy

## 2015-05-23 DIAGNOSIS — B351 Tinea unguium: Secondary | ICD-10-CM | POA: Diagnosis not present

## 2015-05-23 DIAGNOSIS — J8410 Pulmonary fibrosis, unspecified: Secondary | ICD-10-CM

## 2015-05-23 DIAGNOSIS — L602 Onychogryphosis: Secondary | ICD-10-CM | POA: Diagnosis not present

## 2015-05-23 DIAGNOSIS — L601 Onycholysis: Secondary | ICD-10-CM | POA: Diagnosis not present

## 2015-05-27 ENCOUNTER — Encounter (HOSPITAL_COMMUNITY): Payer: Medicare Other

## 2015-05-29 ENCOUNTER — Ambulatory Visit (HOSPITAL_COMMUNITY)
Admission: RE | Admit: 2015-05-29 | Discharge: 2015-05-29 | Disposition: A | Discharge status: Home / Self Care | Payer: Medicare Other | Source: Ambulatory Visit | Attending: Family Medicine | Admitting: Family Medicine

## 2015-05-29 DIAGNOSIS — J8410 Pulmonary fibrosis, unspecified: Secondary | ICD-10-CM | POA: Insufficient documentation

## 2015-05-30 LAB — PULMONARY FUNCTION TEST
DL/VA % pred: 41 %
DL/VA: 1.91 ml/min/mmHg/L
DLCO UNC % PRED: 23 %
DLCO UNC: 7.47 ml/min/mmHg
FEF 25-75 Pre: 2.81 L/sec
FEF2575-%Pred-Pre: 155 %
FEV1-%Pred-Pre: 91 %
FEV1-PRE: 2.48 L
FEV1FVC-%Pred-Pre: 123 %
FEV6-%PRED-PRE: 78 %
FEV6-Pre: 2.83 L
FEV6FVC-%Pred-Pre: 107 %
FVC-%Pred-Pre: 73 %
FVC-Pre: 2.83 L
PRE FEV6/FVC RATIO: 100 %
Pre FEV1/FVC ratio: 87 %
RV % PRED: 76 %
RV: 2.08 L
TLC % pred: 70 %
TLC: 4.97 L

## 2015-06-02 ENCOUNTER — Other Ambulatory Visit: Payer: Self-pay

## 2015-06-02 DIAGNOSIS — R413 Other amnesia: Secondary | ICD-10-CM

## 2015-06-02 DIAGNOSIS — G3184 Mild cognitive impairment, so stated: Secondary | ICD-10-CM

## 2015-06-02 MED ORDER — CLOPIDOGREL BISULFATE 75 MG PO TABS: 75.0000 mg | ORAL_TABLET | Freq: Every day | ORAL | Status: DC

## 2015-06-02 NOTE — Telephone Encounter (Signed)
Rn call [facility] about patients plavix refill. Rn stated patient had a refill done in February 2014 by Nancy(NP). The pharmacist stated they never receive the refill via escribed. Rn gave order based on patients last office visit 05/14/2015 from Nancy(NP).

## 2015-06-24 ENCOUNTER — Other Ambulatory Visit (INDEPENDENT_AMBULATORY_CARE_PROVIDER_SITE_OTHER): Payer: Medicare Other

## 2015-06-24 ENCOUNTER — Ambulatory Visit (INDEPENDENT_AMBULATORY_CARE_PROVIDER_SITE_OTHER): Payer: Medicare Other | Admitting: Pulmonary Disease

## 2015-06-24 ENCOUNTER — Encounter: Payer: Self-pay | Admitting: Pulmonary Disease

## 2015-06-24 VITALS — BP 144/86 | HR 68 | Temp 97.7°F | Ht 70.0 in | Wt 182.8 lb

## 2015-06-24 DIAGNOSIS — I2510 Atherosclerotic heart disease of native coronary artery without angina pectoris: Secondary | ICD-10-CM

## 2015-06-24 DIAGNOSIS — J8410 Pulmonary fibrosis, unspecified: Secondary | ICD-10-CM

## 2015-06-24 LAB — CBC WITH DIFFERENTIAL/PLATELET
BASOS ABS: 0 10*3/uL (ref 0.0–0.1)
Basophils Relative: 0.6 % (ref 0.0–3.0)
Eosinophils Absolute: 0.3 10*3/uL (ref 0.0–0.7)
Eosinophils Relative: 3.4 % (ref 0.0–5.0)
HCT: 40.4 % (ref 39.0–52.0)
Hemoglobin: 13.8 g/dL (ref 13.0–17.0)
Lymphocytes Relative: 43.1 % (ref 12.0–46.0)
Lymphs Abs: 3.4 10*3/uL (ref 0.7–4.0)
MCHC: 34.2 g/dL (ref 30.0–36.0)
MCV: 91.1 fl (ref 78.0–100.0)
MONO ABS: 0.6 10*3/uL (ref 0.1–1.0)
Monocytes Relative: 7.4 % (ref 3.0–12.0)
Neutro Abs: 3.6 10*3/uL (ref 1.4–7.7)
Neutrophils Relative %: 45.5 % (ref 43.0–77.0)
Platelets: 183 10*3/uL (ref 150.0–400.0)
RBC: 4.43 Mil/uL (ref 4.22–5.81)
RDW: 14.2 % (ref 11.5–15.5)
WBC: 7.9 10*3/uL (ref 4.0–10.5)

## 2015-06-24 LAB — COMPREHENSIVE METABOLIC PANEL
ALBUMIN: 4.3 g/dL (ref 3.5–5.2)
ALK PHOS: 52 U/L (ref 39–117)
ALT: 17 U/L (ref 0–53)
AST: 28 U/L (ref 0–37)
BUN: 14 mg/dL (ref 6–23)
CALCIUM: 9.7 mg/dL (ref 8.4–10.5)
CO2: 30 mEq/L (ref 19–32)
CREATININE: 0.96 mg/dL (ref 0.40–1.50)
Chloride: 102 mEq/L (ref 96–112)
GFR: 79.38 mL/min (ref >60.00)
Glucose, Bld: 89 mg/dL (ref 70–99)
Potassium: 4.3 mEq/L (ref 3.5–5.1)
SODIUM: 139 meq/L (ref 135–145)
Total Bilirubin: 1.1 mg/dL (ref 0.2–1.2)
Total Protein: 8 g/dL (ref 6.0–8.3)

## 2015-06-24 LAB — SEDIMENTATION RATE: SED RATE: 34 mm/h — AB (ref 0–22)

## 2015-06-24 LAB — C-REACTIVE PROTEIN: CRP: 0.8 mg/dL (ref 0.5–20.0)

## 2015-06-24 LAB — TSH: TSH: 4.55 u[IU]/mL — AB (ref 0.35–4.50)

## 2015-06-24 NOTE — Progress Notes (Addendum)
Subjective:     Patient ID: [name] [name], male   DOB: [birth year], 82 y.o.   MRN: [id number]  HPI ~  June 24, 2015:  Initial pulmonary consult by SN>  The Britts are friends/ church members with the [name] [name] family... 82 y/o WM referred by Dr.Elaine Laurann [name] for further evaluation & treatment of pulmonary fibrosis>  He recently had a vasc surg follow up visit where they noted crackles in the lung bases and referred him back to [name] [name];  He c/o a slight breathing problem w/ SOB/DOE after mowing the yard for 40 min, climbing hills, playing golf but no real change for years he says (stable- not progressive);  He has noted a slight dry cough x1-94yrs and actually saw ENT for eval post nasal drip/ congestion but no better after sinusitis treatment;  Then he saw Allergist- neg testing, no allergies found;  He has occas beige/yellow sput production, denies hx bronchitis/ recurrent infections, and no f/c/s;  He was seen by [facility] for f/u CDopplers (they remain patent s/p bilat CAEs) and they noted crackles at L>R lung base;  Pt rechecked by [name] [name] w/ similar findings and she rechecked CXR & PFT (see below) => refer to pulmonary...   Smoking Hx>  He is an ex-smoker; started around age 40, smoked for ~103yrs but always <1ppd; Quit smoking 1966 w/ a 10 pack-year smoking hx...  Pulmonary Hx>  He denies any hx asthma, does not recall treatment for bronchits, never had pneumonia, no hx TB or known exposure;  He has been treated for sinus infections.  Medical Hx>  HBP, vasc dis w/ carotid dis (s/p bilat CAEs) & AAA, TIA/ stroke, HL, prostate ca (s/p seeds), DJD/ spondylosis, mild dementia  Family Hx>  There is no family hx of lung disease;  Father was a smoker & had a heart attack & stroke.  Occup Hx>  He was the Psychologist, educational for [employer]- no known exposure to asbestos, silica dust, other inorganic or organic dusts/ materials...  Current Meds>  Astelin/ Atrovent nasal, Zyrtek, Plavix, Resveratrol,  Crestor, Fish Oil, Vits... EXAM shows Afeb, VSS, O2sat=92% on RA;  HEENT- neg, mallampati2;  Chest- bibasilar velcro rales L>R ~1/3rd the way up post chest wall;  Heart- RR w/o m/r/g;  Abd- soft, nontender, neg;  Ext- neg w/o c/c/e...  CT Angio Abd done 01/02/08 (pre-AAA stentgraft)>  Chronic interstitial changes noted at the lung bases bilat; fusiform infrarenal AAA at 3.5cm, mod ostial dis of celiac axis origin...   Old CXR 08/15/08 showed norm heart size, patchy opac at the left lung base, otherw neg...  Old CXR 04/18/13 showed norm heart size, sl tortuous Ao, diffuse incr in interstitial markings L>R...  Recent CXR 05/21/15>  Heart at upper lim of normal, prominent coarse interstitial markings bilat L>R, no focal abn & unchanged from 2015 film...  Full PFT 05/29/15> FVC=2.83 (73%), FEV1=2.48 (91%), %1sec=87, mid-flows wnl at 155% predicted; TLC=4.97 (70%), RV=2.08 (76%), RV/TLC=42%; DLCO=23% predicted; c/w a mild restrictive ventilatory defect & severe decr DLCO.   Ambulatory Oximetry 06/24/15>  O2sat=100% on RA at rest w/ pulse=59/min;  He ambulated only 2 Laps in office w/ lowest O2sat=86% w/ pulse=91/min... He indicated that he does NOT want oxygen at this point.  LABS 06/24/15>  Chems- wnl w/ Cr=0.96;  CBC- wnl w/ Hg=13.8;  TSH=4.55;  Sed=34;  CRP=0.8;  RF>600;  CCP=<16;  ANA=neg  CXR 04/18/13                                          CXR 05/21/15     IMP >>     Interstitial Lung Disease-- likely IPF and we will proceed w/ His-res CT Chest, & Preliminary LAB screen...    CARDIAC issues>  48 y/o, HBP, vasc dis w/ bilat carotid dis (s/p bilat CAEs) & AAA (4.1cm), TIA/ stroke, HL; followed by [name] [name] & [name] [name]...    MEDICAL issues>  12 y/o, HBP, vasc dis w/ bilat carotid dis (s/p bilat CAEs) & AAA, TIA/ stroke, HL, prostate ca (s/p seeds), DJD/ spondylosis, mild dementia; followed by [name] [name] & [name] [name]... PLAN >>     We discussed pulmonary fibrosis and the evaluation- they  are familiar w/ this dis since a church friend was recently diagnosed and has since passed away; they are understandably concerned and I pointed out some differences in [name] [name] presentation (ie- he has had this on his CXR w/o much change since 2015);  We will proceed w/ Labs and Hi-res CT Chest => pending.  ADDENDUM>> Hi-res CT CHEST done 07/02/15>  Norm heart size, atherosclerosis of Ao/ coronaries including [place], no adenopathy or lung nodules; extensive subpleural reticulation, traction bronchiectasis, & architectural distortion bilaterally w/ basilar predominance, moderate honeycombing => all favors UIP diagnosis... I called pt & wife w/ report & they will think about options and f/u OV 4/ 21/17 to discuss further...     Past Medical History  Diagnosis Date  . Allergy   . Hyperlipidemia   . Prostate cancer ([place])   . TIA (transient ischemic attack)   . Diplopia   . Carotid artery stenosis   . AAA (abdominal aortic aneurysm) ([place])   . Stroke ([place]) 04/18/13    some weakness left-returning strength  . Spondylisthesis 2015    Spine    Past Surgical History  Procedure Laterality Date  . Foot surgery       left Achilles Tendon Repair  . Eye surgery      Bilateral cataract   . Endarterectomy Right 04/27/2013    Procedure: ENDARTERECTOMY CAROTID;  Surgeon: [name] Misty, MD;  Location: [facility] OR;  Service: Vascular;  Laterality: Right;  . Carotid endarterectomy  Sept. 2008    Elective left carotid endarterectomy  . Carotid endarterectomy  fEB. 6, 2015    CE  . Seed implantation  2007    for prostate ca    Outpatient Encounter Prescriptions as of 06/24/2015  Medication Sig  . azelastine (ASTELIN) 0.1 % nasal spray Place 1 spray into both nostrils 2 (two) times daily.   . cetirizine (ZYRTEC) 10 MG tablet Take 10 mg by mouth daily.  . clopidogrel (PLAVIX) 75 MG tablet Take 1 tablet (75 mg total) by mouth daily.  Marland Kitchen L-Methylfolate-B12-B6-B2 (CEREFOLIN) 08-20-48-5 MG TABS Take 1 tablet by mouth  daily.  . Multiple Vitamins-Minerals (CENTRUM SILVER PO) Take 1 tablet by mouth daily.   . Omega-3 Fatty Acids (FISH OIL) 1000 MG CPDR Take 1,000 mg by mouth every morning.   Marland Kitchen Resveratrol 100 MG CAPS Take 2 capsules by mouth daily.  . rosuvastatin (CRESTOR) 5 MG tablet Take 5 mg by mouth at bedtime.  . [DISCONTINUED] acetaminophen (TYLENOL) 500 MG tablet Take 1,000 mg by mouth every 4 (four) hours as needed for mild pain. Reported on 06/24/2015  . [DISCONTINUED] ipratropium (  ATROVENT) 0.03 % nasal spray Place 2 sprays into both nostrils 2 (two) times daily as needed. Reported on 06/24/2015  . [DISCONTINUED] Resveratrol 250 MG CAPS Take 250 mg by mouth daily. Reported on 06/24/2015   No facility-administered encounter medications on file as of 06/24/2015.    Allergies  Allergen Reactions  . Sudafed [Pseudoephedrine Hcl]     Causes dizziness and nervousness  . Atorvastatin Other (See Comments)    Reaction: Patient's family reports "confusion" and would prefer crestor     Immunization History  Administered Date(s) Administered  . Influenza-Unspecified 12/21/2014  . Pneumococcal Conjugate-13 02/28/2014  . Pneumococcal Polysaccharide-23 03/23/1999    Family History  Problem Relation Age of Onset  . Stroke Mother   . Heart disease Mother     After age 44  . Varicose Veins Mother   . Hypertension Mother   . Cancer Mother     Theadora Rama   . Stroke Father   . Heart attack Father   . Deep vein thrombosis Father   . Heart disease Brother   . Anuerysm Father     Social History   Social History  . Marital Status: Married    Spouse Name: lee  . Number of Children: 2  . Years of Education: college   Occupational History  . retired     [employer] [place] History Main Topics  . Smoking status: Former Smoker -- 1.00 packs/day for 14 years    Types: Cigarettes    Quit date: 03/27/1965  . Smokeless tobacco: Never Used     Comment: occ alcohol  . Alcohol Use: 0.0 oz/week    0  Standard drinks or equivalent per week     Comment: 3-4 drinks weekly  . Drug Use: No  . Sexual Activity: Not Currently   Other Topics Concern  . Not on file   Social History Narrative    Current Medications, Allergies, Past Medical History, Past Surgical History, Family History, and Social History were reviewed in [employer] record.   Review of Systems             All symptoms NEG except where BOLDED >>  Constitutional:  F/C/S, fatigue, anorexia, unexpected weight change. HEENT:  HA, visual changes, hearing loss, earache, nasal symptoms, sore throat, mouth sores, hoarseness. Resp:  cough, sputum, hemoptysis; SOB, tightness, wheezing. Cardio:  CP, palpit, DOE, orthopnea, edema. GI:  N/V/D/C, blood in stool; reflux, abd pain, distention, gas. GU:  dysuria, freq, urgency, hematuria, flank pain, voiding difficulty. MS:  joint pain, swelling, tenderness, decr ROM; neck pain, back pain, etc. Neuro:  HA, tremors, seizures, dizziness, syncope, weakness, numbness, gait abn. Skin:  suspicious lesions or skin rash. Heme:  adenopathy, bruising, bleeding. Psyche:  Memory loss, confusion, agitation, sleep disturbance, hallucinations, anxiety, depression, suicidal.   Objective:   Physical Exam       Vital Signs:  Reviewed...  General:  WD, WN, 82 y/o WM in NAD; alert & oriented; pleasant & cooperative... HEENT:  [place]/AT; Conjunctiva- pink, Sclera- nonicteric, EOM-wnl, PERRLA, EACs-clear, TMs-wnl; NOSE-clear; THROAT-clear & wnl. Neck:  Supple w/ fair ROM; no JVD; normal carotid impulses w/o bruits; no thyromegaly or nodules palpated; no lymphadenopathy. Chest:  Bibasilar crackles ~1/3rd way up the back;  No wheezing, rhonchi, signs of consolidation... Heart:  Regular Rhythm; norm S1 & S2 without murmurs, rubs, or gallops detected. Abdomen:  Soft & nontender- no guarding or rebound; normal bowel sounds; no organomegaly or masses palpated. Ext:  Normal ROM; without  deformities or arthritic changes; no varicose veins, venous insuffic, or edema;  Pulses intact w/o bruits. Neuro:  No focal neuro deficits; sensory testing normal; gait normal & balance OK. Derm:  No lesions noted; no rash etc. Lymph:  No cervical, supraclavicular, axillary, or inguinal adenopathy palpated.   Assessment:      IMP >>     Interstitial Lung Disease-- likely IPF and we will proceed w/ His-res CT Chest, & Preliminary LAB screen...    CARDIAC issues>  68 y/o, HBP, vasc dis w/ bilat carotid dis (s/p bilat CAEs) & AAA (4.1cm), TIA/ stroke, HL; followed by [name] [name] & [name] [name]...    MEDICAL issues>  11 y/o, HBP, vasc dis w/ bilat carotid dis (s/p bilat CAEs) & AAA, TIA/ stroke, HL, prostate ca (s/p seeds), DJD/ spondylosis, mild dementia; followed by [name] [name] & [name] [name]...  PLAN >>     We discussed pulmonary fibrosis and the evaluation- they are familiar w/ this dis since a church friend was recently diagnosed and has since passed away; they are understandably concerned and I pointed out some differences in [place] presentation (ie- he has had this on his CXR w/o much change since 2015);  We will proceed w/ Labs and Hi-res CT Chest => pending.     Plan:     Patient's Medications  New Prescriptions   No medications on file  Previous Medications   AZELASTINE (ASTELIN) 0.1 % NASAL SPRAY    Place 1 spray into both nostrils 2 (two) times daily.    CETIRIZINE (ZYRTEC) 10 MG TABLET    Take 10 mg by mouth daily.   CLOPIDOGREL (PLAVIX) 75 MG TABLET    Take 1 tablet (75 mg total) by mouth daily.   L-METHYLFOLATE-B12-B6-B2 (CEREFOLIN) 08-20-48-5 MG TABS    Take 1 tablet by mouth daily.   MULTIPLE VITAMINS-MINERALS (CENTRUM SILVER PO)    Take 1 tablet by mouth daily.    OMEGA-3 FATTY ACIDS (FISH OIL) 1000 MG CPDR    Take 1,000 mg by mouth every morning.    RESVERATROL 100 MG CAPS    Take 2 capsules by mouth daily.   ROSUVASTATIN (CRESTOR) 5 MG TABLET    Take 5 mg by mouth at bedtime.    Modified Medications   No medications on file  Discontinued Medications   ACETAMINOPHEN (TYLENOL) 500 MG TABLET    Take 1,000 mg by mouth every 4 (four) hours as needed for mild pain. Reported on 06/24/2015   IPRATROPIUM (ATROVENT) 0.03 % NASAL SPRAY    Place 2 sprays into both nostrils 2 (two) times daily as needed. Reported on 06/24/2015   RESVERATROL 250 MG CAPS    Take 250 mg by mouth daily. Reported on 06/24/2015

## 2015-06-24 NOTE — Patient Instructions (Signed)
Mr. Melian-- it was great meeting you today...  We spent some time discussing pulmonary fibrosis, it's evaluation, and treatment...  Today we checked an ambulatory oximetry test, and some blood work... We will arrange an outpatient Hi-resolution CT scan of your lungs...    We will contact you w/ the results when available...   You may continue to be as active as possible...  Consider using cough drops, thoat lozenges, sugarless gum, or OTC -DELSYM for you cough...  Call for any questions or if I can be of service in any way...  Let's plan a follow up visit in 6weeks, sooner if needed for problems.Marland KitchenMarland Kitchen

## 2015-06-25 LAB — RHEUMATOID FACTOR

## 2015-06-25 LAB — ANA: Anti Nuclear Antibody(ANA): NEGATIVE

## 2015-06-25 LAB — CYCLIC CITRUL PEPTIDE ANTIBODY, IGG

## 2015-07-01 ENCOUNTER — Telehealth: Payer: Self-pay | Admitting: Pulmonary Disease

## 2015-07-01 ENCOUNTER — Ambulatory Visit (INDEPENDENT_AMBULATORY_CARE_PROVIDER_SITE_OTHER)
Admission: RE | Admit: 2015-07-01 | Discharge: 2015-07-01 | Disposition: A | Discharge status: Home / Self Care | Payer: Medicare Other | Source: Ambulatory Visit | Attending: Pulmonary Disease | Admitting: Pulmonary Disease

## 2015-07-01 DIAGNOSIS — R918 Other nonspecific abnormal finding of lung field: Secondary | ICD-10-CM | POA: Diagnosis not present

## 2015-07-01 DIAGNOSIS — J8410 Pulmonary fibrosis, unspecified: Secondary | ICD-10-CM | POA: Diagnosis not present

## 2015-07-01 NOTE — Telephone Encounter (Signed)
Called and spoke with pt's wife. She states that she was calling for the pt's CT results. She explained that SN was to call her tomorrow with the results but she wanted to check today to see if the results had come in. I explained to her that the results have not been finalized yet and that SN has not given his recs. I explained that once we receive the results and recs we will contact her and her husband to review them. She voiced understanding and had no further questions. Nothing further needed at this time.

## 2015-07-07 ENCOUNTER — Telehealth: Payer: Self-pay | Admitting: Pulmonary Disease

## 2015-07-07 NOTE — Telephone Encounter (Signed)
Spoke with pt's wife.  Reports they have spoken with SN since leaving this message.  No further questions or concerns at this time.  Will sign off.

## 2015-07-11 ENCOUNTER — Encounter: Payer: Self-pay | Admitting: Pulmonary Disease

## 2015-07-11 ENCOUNTER — Ambulatory Visit (INDEPENDENT_AMBULATORY_CARE_PROVIDER_SITE_OTHER): Payer: Medicare Other | Admitting: Pulmonary Disease

## 2015-07-11 VITALS — BP 112/72 | HR 82 | Temp 96.8°F | Ht 70.0 in | Wt 182.8 lb

## 2015-07-11 DIAGNOSIS — I2510 Atherosclerotic heart disease of native coronary artery without angina pectoris: Secondary | ICD-10-CM

## 2015-07-11 DIAGNOSIS — Z95828 Presence of other vascular implants and grafts: Secondary | ICD-10-CM | POA: Insufficient documentation

## 2015-07-11 DIAGNOSIS — J84112 Idiopathic pulmonary fibrosis: Secondary | ICD-10-CM | POA: Diagnosis not present

## 2015-07-11 DIAGNOSIS — G3184 Mild cognitive impairment, so stated: Secondary | ICD-10-CM

## 2015-07-11 DIAGNOSIS — I714 Abdominal aortic aneurysm, without rupture, unspecified: Secondary | ICD-10-CM

## 2015-07-11 DIAGNOSIS — Z9889 Other specified postprocedural states: Secondary | ICD-10-CM

## 2015-07-11 DIAGNOSIS — I6523 Occlusion and stenosis of bilateral carotid arteries: Secondary | ICD-10-CM

## 2015-07-11 MED ORDER — PREDNISONE 10 MG PO TABS
10.0000 mg | ORAL_TABLET | Freq: Every day | ORAL | Status: DC
Start: 2015-07-11 — End: 2015-12-11

## 2015-07-11 NOTE — Progress Notes (Addendum)
Subjective:     Patient ID: [name] [name], male   DOB: May 19, 1929, 82 y.o.   MRN: [id number]  HPI  ~  June 24, 2015:  Initial pulmonary consult by SN>  The Britts are friends/ church members with the [employer] family... 82 y/o WM referred by Dr.[name] for further evaluation & treatment of pulmonary fibrosis>  He recently had a vasc surg follow up visit where they noted crackles in the lung bases and referred him back to [name];  He c/o a slight breathing problem w/ SOB/DOE after mowing the yard for 40 min, climbing hills, playing golf but no real change for years he says (stable- not progressive);  He has noted a slight dry cough x1-85yrs and actually saw ENT for eval post nasal drip/ congestion but no better after sinusitis treatment;  Then he saw Allergist- neg testing, no allergies found;  He has occas beige/yellow sput production, denies hx bronchitis/ recurrent infections, and no f/c/s;  He was seen by [facility] for f/u CDopplers (they remain patent s/p bilat CAEs) and they noted crackles at L>R lung base;  Pt rechecked by [name] w/ similar findings and she rechecked CXR & PFT (see below) => refer to pulmonary...   Smoking Hx>  He is an ex-smoker; started around age 60, smoked for ~49yrs but always <1ppd; Quit smoking 1966 w/ a 10 pack-year smoking hx...  Pulmonary Hx>  He denies any hx asthma, does not recall treatment for bronchits, never had pneumonia, no hx TB or known exposure;  He has been treated for sinus infections.  Medical Hx>  HBP, vasc dis w/ carotid dis (s/p bilat CAEs) & AAA, TIA/ stroke, HL, prostate ca (s/p seeds), DJD/ spondylosis, mild dementia  Family Hx>  There is no family hx of lung disease;  Father was a smoker & had a heart attack & stroke.  Occup Hx>  He was the Psychologist, educational for [employer]- no known exposure to asbestos, silica dust, other inorganic or organic dusts/ materials...  Current Meds>  Astelin/ Atrovent nasal, Zyrtek, Plavix,  Resveratrol, Crestor, Fish Oil, Vits... EXAM shows Afeb, VSS, O2sat=92% on RA;  HEENT- neg, mallampati2;  Chest- bibasilar velcro rales L>R ~1/3rd the way up post chest wall;  Heart- RR w/o m/r/g;  Abd- soft, nontender, neg;  Ext- neg w/o c/c/e...  CT Angio Abd done 01/02/08 (pre-AAA stentgraft)>  Chronic interstitial changes noted at the lung bases bilat; fusiform infrarenal AAA at 3.5cm, mod ostial dis of celiac axis origin...   Old CXR 08/15/08 showed norm heart size, patchy opac at the left lung base, otherw neg...  Old CXR 04/18/13 showed norm heart size, sl tortuous Ao, diffuse incr in interstitial markings L>R...  Recent CXR 05/21/15>  Heart at upper lim of normal, prominent coarse interstitial markings bilat L>R, no focal abn & unchanged from 2015 film...  Full PFT 05/29/15> FVC=2.83 (73%), FEV1=2.48 (91%), %1sec=87, mid-flows wnl at 155% predicted; TLC=4.97 (70%), RV=2.08 (76%), RV/TLC=42%; DLCO=23% predicted; c/w a mild restrictive ventilatory defect & severe decr DLCO.   Ambulatory Oximetry 06/24/15>  O2sat=100% on RA at rest w/ pulse=59/min;  He ambulated only 2 Laps in office w/ lowest O2sat=86% w/ pulse=91/min... He indicated that he does NOT want oxygen at this point.  LABS 06/24/15>  Chems- wnl w/ Cr=0.96;  CBC- wnl w/ Hg=13.8;  TSH=4.55;  Sed=34;  CRP=0.8;  RF>600;  CCP=<16;  ANA=neg  CXR 04/18/13                                          CXR 05/21/15     IMP >>     Interstitial Lung Disease-- likely IPF and we will proceed w/ His-res CT Chest, & Preliminary LAB screen...    CARDIAC issues>  39 y/o, HBP, vasc dis w/ bilat carotid dis (s/p bilat CAEs) & AAA (4.1cm), TIA/ stroke, HL; followed by [name] & [name]...    MEDICAL issues>  54 y/o, HBP, vasc dis w/ bilat carotid dis (s/p bilat CAEs) & AAA, TIA/ stroke, HL, prostate ca (s/p seeds), DJD/ spondylosis, mild dementia; followed by [name] & [name]... PLAN >>     We discussed pulmonary fibrosis and the  evaluation- they are familiar w/ this dis since a church friend was recently diagnosed and has since passed away; they are understandably concerned and I pointed out some differences in [place] presentation (ie- he has had this on his CXR w/o much change since 2015);  We will proceed w/ Labs and Hi-res CT Chest => pending.  ADDENDUM>> Hi-res CT CHEST done 07/02/15>  Norm heart size, atherosclerosis of Ao/ coronaries including [place], no adenopathy or lung nodules; extensive subpleural reticulation, traction bronchiectasis, & architectural distortion bilaterally w/ basilar predominance, moderate honeycombing => all favors UIP diagnosis... I called pt & wife w/ report & they will think about options and f/u OV 4/ 21/17 to discuss further...   ~  July 11, 2015:  2wk ROV w/ SN>  As above I have prev discussed his results to date w/ pt & wife & layed out his options for treating his UIP/ IPF => specifically I outlined a 3-prong approach w/ Aggressive/ Middle-of-the-road/ Conservative options;  They have considered each of these approaches and discussed w/ their children => they have chosen the 2nd option for a middle-of-the-road approach, and in this regard they feel that additional collagen-vasc blood work would not add substantially to his treatment options, and they do not wish to pursue antifibrotic therapy w/ Ofev or Esbriet;  We decided to start pt on a low dose of Pred 10mg /d and he is cautioned to elim salt from their diet, & watch caloric intake;  We plan ROV in 6-8 weeks w/ CXR & Labs at that time...   Ambulatory Oximetry 07/11/15>  O2sat=95% on RA at rest w/ pulse=74/min;  He ambulated 3 Laps in the office w/ lowest O2sat=81% w/ pulse=103/min;  He qualifies for Home Oxygen (rec 2L/min w/ exercise) but he is reluctant to start.   ONO => ordered & pending IMP/PLAN>>  Bill needs to start on HomeO2-POC for oxygen therapy w/ activities but he is reluctant to do so & declines my recommendation to start O2 now;  he notes that he is asymptomatic- plays golf 2d/wk, mows yard, walks, etc and denies SOB, wife is not so sure (he is very stoic and has "mild cognitive impairment"); we decided to check the ONO & then proceed w/ Home O2 as indicated...    Past Medical History  Diagnosis Date  . Allergy   . Hyperlipidemia   . Prostate cancer ([place])   . TIA (transient ischemic attack)   . Diplopia   . Carotid artery stenosis   . AAA (abdominal aortic aneurysm) ([place])   . Stroke ([place]) 04/18/13    some weakness left-returning strength  . Spondylisthesis  2015    Spine    Past Surgical History  Procedure Laterality Date  . Foot surgery       left Achilles Tendon Repair  . Eye surgery      Bilateral cataract   . Endarterectomy Right 04/27/2013    Procedure: ENDARTERECTOMY CAROTID;  Surgeon: Mal Misty, MD;  Location: [facility] OR;  Service: Vascular;  Laterality: Right;  . Carotid endarterectomy  Sept. 2008    Elective left carotid endarterectomy  . Carotid endarterectomy  fEB. 6, 2015    CE  . Seed implantation  2007    for prostate ca    Outpatient Encounter Prescriptions as of 07/11/2015  Medication Sig  . azelastine (ASTELIN) 0.1 % nasal spray Place 1 spray into both nostrils 2 (two) times daily.   . cetirizine (ZYRTEC) 10 MG tablet Take 10 mg by mouth daily.  . clopidogrel (PLAVIX) 75 MG tablet Take 1 tablet (75 mg total) by mouth daily.  . Multiple Vitamins-Minerals (CENTRUM SILVER PO) Take 1 tablet by mouth daily.   . Omega-3 Fatty Acids (FISH OIL) 1000 MG CPDR Take 1,000 mg by mouth every morning.   Marland Kitchen Resveratrol 100 MG CAPS Take 2 capsules by mouth daily.  . rosuvastatin (CRESTOR) 5 MG tablet Take 5 mg by mouth at bedtime.  . predniSONE (DELTASONE) 10 MG tablet Take 1 tablet (10 mg total) by mouth daily with breakfast.  . [DISCONTINUED] L-Methylfolate-B12-B6-B2 (CEREFOLIN) 08-20-48-5 MG TABS Take 1 tablet by mouth daily.   No facility-administered encounter medications on file as of 07/11/2015.      Allergies  Allergen Reactions  . Sudafed [Pseudoephedrine Hcl]     Causes dizziness and nervousness  . Atorvastatin Other (See Comments)    Reaction: Patient's family reports "confusion" and would prefer crestor     Immunization History  Administered Date(s) Administered  . Influenza-Unspecified 12/21/2014  . Pneumococcal Conjugate-13 02/28/2014  . Pneumococcal Polysaccharide-23 03/23/1999    Family History  Problem Relation Age of Onset  . Stroke Mother   . Heart disease Mother     After age 3  . Varicose Veins Mother   . Hypertension Mother   . Cancer Mother     Theadora Rama   . Stroke Father   . Heart attack Father   . Deep vein thrombosis Father   . Heart disease Brother   . Anuerysm Father     Social History   Social History  . Marital Status: Married    Spouse Name: lee  . Number of Children: 2  . Years of Education: college   Occupational History  . retired     [employer] History Main Topics  . Smoking status: Former Smoker -- 1.00 packs/day for 14 years    Types: Cigarettes    Quit date: 03/27/1965  . Smokeless tobacco: Never Used     Comment: occ alcohol  . Alcohol Use: 0.0 oz/week    0 Standard drinks or equivalent per week     Comment: 3-4 drinks weekly  . Drug Use: No  . Sexual Activity: Not Currently   Other Topics Concern  . Not on file   Social History Narrative    Current Medications, Allergies, Past Medical History, Past Surgical History, Family History, and Social History were reviewed in [employer] record.   Review of Systems             All symptoms NEG except where BOLDED >>  Constitutional:  F/C/S, fatigue, anorexia, unexpected weight change. HEENT:  HA, visual changes, hearing loss, earache, nasal symptoms, sore throat, mouth sores, hoarseness. Resp:  cough, sputum, hemoptysis; SOB, tightness, wheezing. Cardio:  CP, palpit, DOE, orthopnea, edema. GI:  N/V/D/C, blood in stool;  reflux, abd pain, distention, gas. GU:  dysuria, freq, urgency, hematuria, flank pain, voiding difficulty. MS:  joint pain, swelling, tenderness, decr ROM; neck pain, back pain, etc. Neuro:  HA, tremors, seizures, dizziness, syncope, weakness, numbness, gait abn. Skin:  suspicious lesions or skin rash. Heme:  adenopathy, bruising, bleeding. Psyche:  Memory loss, confusion, agitation, sleep disturbance, hallucinations, anxiety, depression, suicidal.   Objective:   Physical Exam       Vital Signs:  Reviewed...  General:  WD, WN, 82 y/o WM in NAD; alert & oriented; pleasant & cooperative... HEENT:  [place]/AT; Conjunctiva- pink, Sclera- nonicteric, EOM-wnl, PERRLA, EACs-clear, TMs-wnl; NOSE-clear; THROAT-clear & wnl. Neck:  Supple w/ fair ROM; no JVD; normal carotid impulses w/o bruits; no thyromegaly or nodules palpated; no lymphadenopathy. Chest:  Bibasilar crackles ~1/3rd way up the back;  No wheezing, rhonchi, signs of consolidation... Heart:  Regular Rhythm; norm S1 & S2 without murmurs, rubs, or gallops detected. Abdomen:  Soft & nontender- no guarding or rebound; normal bowel sounds; no organomegaly or masses palpated. Ext:  Normal ROM; without deformities or arthritic changes; no varicose veins, venous insuffic, or edema;  Pulses intact w/o bruits. Neuro:  No focal neuro deficits; sensory testing normal; gait normal & balance OK. Derm:  No lesions noted; no rash etc. Lymph:  No cervical, supraclavicular, axillary, or inguinal adenopathy palpated.   Assessment:      IMP >>     Interstitial Lung Disease-- UIP/ IPF w/ characteristic Hi-res CT Chest findings and prelim collagen-vasc labs showing elev Rheum Factor>600 & Sed=34, otherw neg Anti-CCP/ ANA/ & CRP...    CARDIAC issues>  22 y/o, HBP, vasc dis w/ bilat carotid dis (s/p bilat CAEs) & AAA (4.1cm), TIA/ stroke, HL; followed by [name] & [name]...    MEDICAL issues>  41 y/o, HBP, vasc dis w/ bilat carotid dis (s/p bilat CAEs) &  AAA, TIA/ stroke, HL, prostate ca (s/p seeds), DJD/ spondylosis, mild dementia; followed by [name] & [name]...  PLAN >>  06/24/15>   We discussed pulmonary fibrosis and the evaluation- they are familiar w/ this dis since a church friend was recently diagnosed and has since passed away; they are understandably concerned and I pointed out some differences in [place] presentation (ie- he has had this on his CXR w/o much change since 2015);  We will proceed w/ Labs and Hi-res CT Chest... 07/11/15>   Pt & family have opted for a middle-of-the-road approach and declined further testing & Antifibrotic therapy; he appears to need O2 rx w/ activities now but is reluctant to accept this (we will check ONO & make final recommendation);  Plus we will give him a trial of Pred 10mg /d...     Plan:     Patient's Medications  New Prescriptions   PREDNISONE (DELTASONE) 10 MG TABLET    Take 1 tablet (10 mg total) by mouth daily with breakfast.  Previous Medications   AZELASTINE (ASTELIN) 0.1 % NASAL SPRAY    Place 1 spray into both nostrils 2 (two) times daily.    CETIRIZINE (ZYRTEC) 10 MG TABLET    Take 10 mg by mouth daily.   CLOPIDOGREL (PLAVIX) 75 MG TABLET    Take 1 tablet (75 mg total) by mouth daily.   MULTIPLE  VITAMINS-MINERALS (CENTRUM SILVER PO)    Take 1 tablet by mouth daily.    OMEGA-3 FATTY ACIDS (FISH OIL) 1000 MG CPDR    Take 1,000 mg by mouth every morning.    RESVERATROL 100 MG CAPS    Take 2 capsules by mouth daily.   ROSUVASTATIN (CRESTOR) 5 MG TABLET    Take 5 mg by mouth at bedtime.  Modified Medications   No medications on file  Discontinued Medications   L-METHYLFOLATE-B12-B6-B2 (CEREFOLIN) 08-20-48-5 MG TABS    Take 1 tablet by mouth daily.

## 2015-07-11 NOTE — Patient Instructions (Signed)
Today we updated your med list in our EPIC system...    Continue your current medications the same...  Today we decided on a "middle-of-th-road" approach to your pulmonary fibrosis.Marland KitchenMarland Kitchen    In this regard we will start PREDNISONE 10mg  daily (one tab each AM).    Remember to eliminate salt or sodium from your diet, and watch your caloric intake...  Stay as active as poss & report any increased shortness of breath noted w/ activity...  We will check an OVERNIGHT OXIMETRY TEST...    We will contact you w/ the results when available...   Call for any questions...  Let's plan a follow up visit in 6-8 weeks, sooner if needed for problems.Marland KitchenMarland Kitchen

## 2015-07-16 DIAGNOSIS — J84111 Idiopathic interstitial pneumonia, not otherwise specified: Secondary | ICD-10-CM | POA: Diagnosis not present

## 2015-07-18 ENCOUNTER — Telehealth: Payer: Self-pay | Admitting: Pulmonary Disease

## 2015-07-18 DIAGNOSIS — I2510 Atherosclerotic heart disease of native coronary artery without angina pectoris: Secondary | ICD-10-CM | POA: Diagnosis not present

## 2015-07-18 DIAGNOSIS — J84112 Idiopathic pulmonary fibrosis: Secondary | ICD-10-CM

## 2015-07-18 NOTE — Telephone Encounter (Signed)
Per SN:  Pt qualifies for home O2/oxygen concentrator at 2lpm qhs and prn during the day.   Called and spoke to pt's wife and informed her of the results and recs per SN. Order placed for nocturnal and daytime O2. Pt's wife verbalized understanding and states the pt may not be receptive to day time O2 but may be willing to try. Nothing further needed at this time.

## 2015-07-18 NOTE — Telephone Encounter (Signed)
Spoke with [place] from [facility]. States that pt qualifies for 24 hour oxygen but pt does not want to do this. He only wants to use oxygen at nigh time. [facility] will be faxing over the pt's ONO report for SN to review. Will route to [place] to follow up on.

## 2015-07-23 ENCOUNTER — Encounter: Payer: Self-pay | Admitting: Pulmonary Disease

## 2015-07-24 DIAGNOSIS — J300 Vasomotor rhinitis: Secondary | ICD-10-CM | POA: Diagnosis not present

## 2015-08-07 ENCOUNTER — Ambulatory Visit: Payer: Medicare Other | Admitting: Pulmonary Disease

## 2015-08-07 DIAGNOSIS — H5203 Hypermetropia, bilateral: Secondary | ICD-10-CM | POA: Diagnosis not present

## 2015-08-13 ENCOUNTER — Ambulatory Visit: Payer: Medicare Other | Admitting: Pulmonary Disease

## 2015-08-17 DIAGNOSIS — I2510 Atherosclerotic heart disease of native coronary artery without angina pectoris: Secondary | ICD-10-CM | POA: Diagnosis not present

## 2015-08-17 DIAGNOSIS — J84112 Idiopathic pulmonary fibrosis: Secondary | ICD-10-CM | POA: Diagnosis not present

## 2015-08-27 ENCOUNTER — Ambulatory Visit (INDEPENDENT_AMBULATORY_CARE_PROVIDER_SITE_OTHER)
Admission: RE | Admit: 2015-08-27 | Discharge: 2015-08-27 | Disposition: A | Discharge status: Home / Self Care | Payer: Medicare Other | Source: Ambulatory Visit | Attending: Pulmonary Disease | Admitting: Pulmonary Disease

## 2015-08-27 ENCOUNTER — Ambulatory Visit (INDEPENDENT_AMBULATORY_CARE_PROVIDER_SITE_OTHER): Payer: Medicare Other | Admitting: Pulmonary Disease

## 2015-08-27 ENCOUNTER — Encounter: Payer: Self-pay | Admitting: Pulmonary Disease

## 2015-08-27 ENCOUNTER — Other Ambulatory Visit (INDEPENDENT_AMBULATORY_CARE_PROVIDER_SITE_OTHER): Payer: Medicare Other

## 2015-08-27 VITALS — BP 110/62 | HR 70 | Temp 97.5°F | Ht 69.0 in | Wt 183.4 lb

## 2015-08-27 DIAGNOSIS — I714 Abdominal aortic aneurysm, without rupture, unspecified: Secondary | ICD-10-CM

## 2015-08-27 DIAGNOSIS — J84112 Idiopathic pulmonary fibrosis: Secondary | ICD-10-CM

## 2015-08-27 DIAGNOSIS — Z9889 Other specified postprocedural states: Secondary | ICD-10-CM

## 2015-08-27 DIAGNOSIS — J8410 Pulmonary fibrosis, unspecified: Secondary | ICD-10-CM | POA: Diagnosis not present

## 2015-08-27 DIAGNOSIS — R413 Other amnesia: Secondary | ICD-10-CM

## 2015-08-27 DIAGNOSIS — I2510 Atherosclerotic heart disease of native coronary artery without angina pectoris: Secondary | ICD-10-CM

## 2015-08-27 DIAGNOSIS — I6523 Occlusion and stenosis of bilateral carotid arteries: Secondary | ICD-10-CM

## 2015-08-27 DIAGNOSIS — Z95828 Presence of other vascular implants and grafts: Secondary | ICD-10-CM

## 2015-08-27 LAB — CBC WITH DIFFERENTIAL/PLATELET
Basophils Absolute: 0 10*3/uL (ref 0.0–0.1)
Basophils Relative: 0.3 % (ref 0.0–3.0)
EOS PCT: 1.6 % (ref 0.0–5.0)
Eosinophils Absolute: 0.2 10*3/uL (ref 0.0–0.7)
HCT: 41.5 % (ref 39.0–52.0)
HEMOGLOBIN: 14.2 g/dL (ref 13.0–17.0)
Lymphocytes Relative: 26.4 % (ref 12.0–46.0)
Lymphs Abs: 3.2 10*3/uL (ref 0.7–4.0)
MCHC: 34.3 g/dL (ref 30.0–36.0)
MCV: 90.6 fl (ref 78.0–100.0)
MONOS PCT: 6.2 % (ref 3.0–12.0)
Monocytes Absolute: 0.8 10*3/uL (ref 0.1–1.0)
Neutro Abs: 7.9 10*3/uL — ABNORMAL HIGH (ref 1.4–7.7)
Neutrophils Relative %: 65.5 % (ref 43.0–77.0)
Platelets: 171 10*3/uL (ref 150.0–400.0)
RBC: 4.58 Mil/uL (ref 4.22–5.81)
RDW: 14.4 % (ref 11.5–15.5)
WBC: 12.1 10*3/uL — AB (ref 4.0–10.5)

## 2015-08-27 LAB — BASIC METABOLIC PANEL
BUN: 18 mg/dL (ref 6–23)
CO2: 33 mEq/L — ABNORMAL HIGH (ref 19–32)
Calcium: 9.7 mg/dL (ref 8.4–10.5)
Chloride: 100 mEq/L (ref 96–112)
Creatinine, Ser: 0.96 mg/dL (ref 0.40–1.50)
GFR: 79.34 mL/min (ref >60.00)
Glucose, Bld: 70 mg/dL (ref 70–99)
POTASSIUM: 3.9 meq/L (ref 3.5–5.1)
SODIUM: 139 meq/L (ref 135–145)

## 2015-08-27 NOTE — Patient Instructions (Signed)
Today we updated your med list in our EPIC system...    Continue your current medications the same...  We signed a handicap placard for you...  We will contact [facility] regatrding the portable oxygen concentrator...  Today we did a follow up CXR & metabolic labs...    We will contact you w/ the results when available...   Continue your current meds and let's plan a follow up visit in 61months.Marland KitchenMarland Kitchen

## 2015-08-27 NOTE — Progress Notes (Signed)
Subjective:     Patient ID: [name] [name], male   DOB: May 19, 1929, 82 y.o.   MRN: [id number]  HPI  ~  June 24, 2015:  Initial pulmonary consult by SN>  The Britts are friends/ church members with the [employer] family... 82 y/o WM referred by Dr.Elaine Laurann [name] for further evaluation & treatment of pulmonary fibrosis>  He recently had a vasc surg follow up visit where they noted crackles in the lung bases and referred him back to [name] [name];  He c/o a slight breathing problem w/ SOB/DOE after mowing the yard for 40 min, climbing hills, playing golf but no real change for years he says (stable- not progressive);  He has noted a slight dry cough x1-85yrs and actually saw ENT for eval post nasal drip/ congestion but no better after sinusitis treatment;  Then he saw Allergist- neg testing, no allergies found;  He has occas beige/yellow sput production, denies hx bronchitis/ recurrent infections, and no f/c/s;  He was seen by [facility] for f/u CDopplers (they remain patent s/p bilat CAEs) and they noted crackles at L>R lung base;  Pt rechecked by [name] [name] w/ similar findings and she rechecked CXR & PFT (see below) => refer to pulmonary...   Smoking Hx>  He is an ex-smoker; started around age 60, smoked for ~49yrs but always <1ppd; Quit smoking 1966 w/ a 10 pack-year smoking hx...  Pulmonary Hx>  He denies any hx asthma, does not recall treatment for bronchits, never had pneumonia, no hx TB or known exposure;  He has been treated for sinus infections.  Medical Hx>  HBP, vasc dis w/ carotid dis (s/p bilat CAEs) & AAA, TIA/ stroke, HL, prostate ca (s/p seeds), DJD/ spondylosis, mild dementia  Family Hx>  There is no family hx of lung disease;  Father was a smoker & had a heart attack & stroke.  Occup Hx>  He was the Psychologist, educational for [employer]- no known exposure to asbestos, silica dust, other inorganic or organic dusts/ materials...  Current Meds>  Astelin/ Atrovent nasal, Zyrtek, Plavix,  Resveratrol, Crestor, Fish Oil, Vits... EXAM shows Afeb, VSS, O2sat=92% on RA;  HEENT- neg, mallampati2;  Chest- bibasilar velcro rales L>R ~1/3rd the way up post chest wall;  Heart- RR w/o m/r/g;  Abd- soft, nontender, neg;  Ext- neg w/o c/c/e...  CT Angio Abd done 01/02/08 (pre-AAA stentgraft)>  Chronic interstitial changes noted at the lung bases bilat; fusiform infrarenal AAA at 3.5cm, mod ostial dis of celiac axis origin...   Old CXR 08/15/08 showed norm heart size, patchy opac at the left lung base, otherw neg...  Old CXR 04/18/13 showed norm heart size, sl tortuous Ao, diffuse incr in interstitial markings L>R...  Recent CXR 05/21/15>  Heart at upper lim of normal, prominent coarse interstitial markings bilat L>R, no focal abn & unchanged from 2015 film...  Full PFT 05/29/15> FVC=2.83 (73%), FEV1=2.48 (91%), %1sec=87, mid-flows wnl at 155% predicted; TLC=4.97 (70%), RV=2.08 (76%), RV/TLC=42%; DLCO=23% predicted; c/w a mild restrictive ventilatory defect & severe decr DLCO.   Ambulatory Oximetry 06/24/15>  O2sat=100% on RA at rest w/ pulse=59/min;  He ambulated only 2 Laps in office w/ lowest O2sat=86% w/ pulse=91/min... He indicated that he does NOT want oxygen at this point.  LABS 06/24/15>  Chems- wnl w/ Cr=0.96;  CBC- wnl w/ Hg=13.8;  TSH=4.55;  Sed=34;  CRP=0.8;  RF>600;  CCP=<16;  ANA=neg  CXR 04/18/13                                          CXR 05/21/15     IMP >>     Interstitial Lung Disease-- likely IPF and we will proceed w/ His-res CT Chest, & Preliminary LAB screen...    CARDIAC issues>  40 y/o, HBP, vasc dis w/ bilat carotid dis (s/p bilat CAEs) & AAA (4.1cm), TIA/ stroke, HL; followed by [name] [name] & [name] [name]...    MEDICAL issues>  45 y/o, HBP, vasc dis w/ bilat carotid dis (s/p bilat CAEs) & AAA, TIA/ stroke, HL, prostate ca (s/p seeds), DJD/ spondylosis, mild dementia; followed by [name] [name] & [name] [name]... PLAN >>     We discussed pulmonary fibrosis and the  evaluation- they are familiar w/ this dis since a church friend was recently diagnosed and has since passed away; they are understandably concerned and I pointed out some differences in [name] presentation (ie- he has had this on his CXR w/o much change since 2015);  We will proceed w/ Labs and Hi-res CT Chest => pending.  ADDENDUM>> Hi-res CT CHEST done 07/02/15>  Norm heart size, atherosclerosis of Ao/ coronaries including [place], no adenopathy or lung nodules; extensive subpleural reticulation, traction bronchiectasis, & architectural distortion bilaterally w/ basilar predominance, moderate honeycombing => all favors UIP diagnosis... I called pt & wife w/ report & they will think about options and f/u OV 07/11/15 to discuss further...   ~  July 11, 2015:  2wk ROV w/ SN>  As above I have prev discussed his results to date w/ pt & wife & layed out his options for treating his UIP/ IPF => specifically I outlined a 3-prong approach w/ Aggressive/ Middle-of-the-road/ Conservative options;  They have considered each of these approaches and discussed w/ their children => they have chosen the 2nd option for a middle-of-the-road approach, and in this regard they feel that additional collagen-vasc blood work would not add substantially to his treatment options, and they do not wish to pursue antifibrotic therapy w/ Ofev or Esbriet;  We decided to start pt on a low dose of Pred 10mg /d and he is cautioned to elim salt from their diet, & watch caloric intake;  We plan ROV in 6-8 weeks w/ CXR & Labs at that time...   Ambulatory Oximetry 07/11/15>  O2sat=95% on RA at rest w/ pulse=74/min;  He ambulated 3 Laps (185'ea) w/ lowest O2sat=81% w/ pulse=103/min;  He qualifies for Home Oxygen (rec 2L/min w/ exercise) but he declines for now.  ONO => pending => this was NOT completed. IMP/PLAN>>  [name] [name] needs to start on HomeO2-POC for oxygen therapy w/ activities but he is reluctant to do so & declines my recommendation to start O2  now; he notes that he is asymptomatic- plays golf 2d/wk, mows yard, walks, etc and denies SOB, wife is not so sure (he is very stoic and has "mild cognitive impairment"); we decided to check the ONO & then proceed w/ Home O2 as indicated...  ~  August 27, 2015:  6wk ROV w/ SN>  [name] [name] returns or f/u of his IPF/ probUIP after starting o Pred10 last visit, and reports a good interval, as noted he is stoic & has mild cognitive impairment; he is currently taking PREDNISONE 10mg /d & tol well, has Home O2 at 2L/min (doesn't like it & compliance is fair); he continues to  golf & do yard work- he does not use the port O2 despite desat w/ exercise- he just takes his time & uses rest stops...    EXAM shows Afeb, VSS, O2sat=92% on RA;  HEENT- neg, mallampati2;  Chest- bibasilar velcro rales L>R ~1/3rd the way up post chest wall;  Heart- RR w/o m/r/g;  Abd- soft, nontender, neg;  Ext- neg w/o c/c/e...  CXR 08/27/15 (independently reviewed by me in the PACS system) showed norm heart size, tortuous Ao, stable bilat fibrotic changes, NAD...  LABS 08/27/15>  Chems- ok x HCO3=33;  CBC- ok x wbc=12.1K... IMP/PLAN>>  [name] [name] is stable on the [place] well; we wrote for a handicap placard; discussed asking [employer] company for a POC to encourage using oxygen w/ activities... we plan ROV in 80mo.    Past Medical History  Diagnosis Date  . Allergy   . Hyperlipidemia   . Prostate cancer ([place])   . TIA (transient ischemic attack)   . Diplopia   . Carotid artery stenosis   . AAA (abdominal aortic aneurysm) ([place])   . Stroke ([place]) 04/18/13    some weakness left-returning strength  . Spondylisthesis 2015    Spine    Past Surgical History  Procedure Laterality Date  . Foot surgery       left Achilles Tendon Repair  . Eye surgery      Bilateral cataract   . Endarterectomy Right 04/27/2013    Procedure: ENDARTERECTOMY CAROTID;  Surgeon: Mal Misty, MD;  [facility];  Service: Vascular;  Laterality: Right;  .  Carotid endarterectomy  Sept. 2008    Elective left carotid endarterectomy  . Carotid endarterectomy  fEB. 6, 2015    CE  . Seed implantation  2007    for prostate ca    Outpatient Encounter Prescriptions as of 08/27/2015  Medication Sig  . azelastine (ASTELIN) 0.1 % nasal spray Place 1 spray into both nostrils 2 (two) times daily.   . cetirizine (ZYRTEC) 10 MG tablet Take 10 mg by mouth daily.  . clopidogrel (PLAVIX) 75 MG tablet Take 1 tablet (75 mg total) by mouth daily.  . Multiple Vitamins-Minerals (CENTRUM SILVER PO) Take 1 tablet by mouth daily.   . Omega-3 Fatty Acids (FISH OIL) 1000 MG CPDR Take 1,000 mg by mouth every morning.   . predniSONE (DELTASONE) 10 MG tablet Take 1 tablet (10 mg total) by mouth daily with breakfast.  . Resveratrol 100 MG CAPS Take 2 capsules by mouth daily.  . rosuvastatin (CRESTOR) 5 MG tablet Take 5 mg by mouth at bedtime.   No facility-administered encounter medications on file as of 08/27/2015.    Allergies  Allergen Reactions  . Sudafed [Pseudoephedrine Hcl]     Causes dizziness and nervousness  . Atorvastatin Other (See Comments)    Reaction: Patient's family reports "confusion" and would prefer crestor     Immunization History  Administered Date(s) Administered  . Influenza-Unspecified 12/21/2014  . Pneumococcal Conjugate-13 02/28/2014  . Pneumococcal Polysaccharide-23 03/23/1999    Family History  Problem Relation Age of Onset  . Stroke Mother   . Heart disease Mother     After age 42  . Varicose Veins Mother   . Hypertension Mother   . Cancer Mother     Theadora Rama   . Stroke Father   . Heart attack Father   . Deep vein thrombosis Father   . Heart disease Brother   . Anuerysm Father     Social History  Social History  . Marital Status: Married    Spouse Name: lee  . Number of Children: 2  . Years of Education: college   Occupational History  . retired     [place] [employer] [place] History Main Topics  . Smoking  status: Former Smoker -- 1.00 packs/day for 14 years    Types: Cigarettes    Quit date: 03/27/1965  . Smokeless tobacco: Never Used     Comment: occ alcohol  . Alcohol Use: 0.0 oz/week    0 Standard drinks or equivalent per week     Comment: 3-4 drinks weekly  . Drug Use: No  . Sexual Activity: Not Currently   Other Topics Concern  . Not on file   Social History Narrative    Current Medications, Allergies, Past Medical History, Past Surgical History, Family History, and Social History were reviewed in [employer] record.   Review of Systems             All symptoms NEG except where BOLDED >>  Constitutional:  F/C/S, fatigue, anorexia, unexpected weight change. HEENT:  HA, visual changes, hearing loss, earache, nasal symptoms, sore throat, mouth sores, hoarseness. Resp:  cough, sputum, hemoptysis; SOB, tightness, wheezing. Cardio:  CP, palpit, DOE, orthopnea, edema. GI:  N/V/D/C, blood in stool; reflux, abd pain, distention, gas. GU:  dysuria, freq, urgency, hematuria, flank pain, voiding difficulty. MS:  joint pain, swelling, tenderness, decr ROM; neck pain, back pain, etc. Neuro:  HA, tremors, seizures, dizziness, syncope, weakness, numbness, gait abn. Skin:  suspicious lesions or skin rash. Heme:  adenopathy, bruising, bleeding. Psyche:  Memory loss, confusion, agitation, sleep disturbance, hallucinations, anxiety, depression, suicidal.   Objective:   Physical Exam       Vital Signs:  Reviewed...   General:  WD, WN, 82 y/o WM in NAD; alert & oriented; pleasant & cooperative... HEENT:  [place]/AT; Conjunctiva- pink, Sclera- nonicteric, EOM-wnl, PERRLA, EACs-clear, TMs-wnl; NOSE-clear; THROAT-clear & wnl.  Neck:  Supple w/ fair ROM; no JVD; normal carotid impulses w/o bruits; no thyromegaly or nodules palpated; no lymphadenopathy.  Chest:  Bibasilar crackles ~1/3rd way up the back;  No wheezing, rhonchi, signs of consolidation... Heart:  Regular Rhythm;  norm S1 & S2 without murmurs, rubs, or gallops detected. Abdomen:  Soft & nontender- no guarding or rebound; normal bowel sounds; no organomegaly or masses palpated. Ext:  Normal ROM; without deformities or arthritic changes; no varicose veins, venous insuffic, or edema;  Pulses intact w/o bruits. Neuro:  No focal neuro deficits; sensory testing normal; gait normal & balance OK. Derm:  No lesions noted; no rash etc. Lymph:  No cervical, supraclavicular, axillary, or inguinal adenopathy palpated.   Assessment:      IMP >>     Interstitial Lung Disease-- UIP/ IPF w/ characteristic Hi-res CT Chest findings and prelim collagen-vasc labs showing elev Rheum Factor>600 & Sed=34, otherw neg Anti-CCP/ ANA/ & CRP    CARDIAC issues>  55 y/o, HBP, vasc dis w/ bilat carotid dis (s/p bilat CAEs) & AAA (4.1cm), TIA/ stroke, HL; followed by [name] [name] & [name] [name]...    MEDICAL issues>  70 y/o, HBP, vasc dis w/ bilat carotid dis (s/p bilat CAEs) & AAA, TIA/ stroke, HL, prostate ca (s/p seeds), DJD/ spondylosis, mild dementia; followed by [name] [name] & [name] [name]...  PLAN >>  06/24/15>   We discussed pulmonary fibrosis and the evaluation- they are familiar w/ this dis since a church friend was recently diagnosed and has since passed away;  they are understandably concerned and I pointed out some differences in [name] [name]'s presentation (ie- he has had this on his CXR w/o much change since 2015);  We will proceed w/ Labs and Hi-res CT Chest... 07/11/15>   Pt & family have opted for a middle-of-the-road approach and declined further testing & Antifibrotic therapy; he appears to need O2 rx w/ activities now but is reluctant to accept this (we will check ONO & make final recommendation);  Plus we will give him a trial of Pred 10mg /d... 08/27/15>   [name] [name] is stable on the [place] well; we wrote for a handicap placard; discussed asking [employer] company for a POC to encourage using oxygen w/ activities.     Plan:     CONTINUE  HOME OXYGEN at 2L/min w/ exercise & Qhs...  Patient's Medications  New Prescriptions   No medications on file  Previous Medications   AZELASTINE (ASTELIN) 0.1 % NASAL SPRAY    Place 1 spray into both nostrils 2 (two) times daily.    CETIRIZINE (ZYRTEC) 10 MG TABLET    Take 10 mg by mouth daily.   CLOPIDOGREL (PLAVIX) 75 MG TABLET    Take 1 tablet (75 mg total) by mouth daily.   MULTIPLE VITAMINS-MINERALS (CENTRUM SILVER PO)    Take 1 tablet by mouth daily.    OMEGA-3 FATTY ACIDS (FISH OIL) 1000 MG CPDR    Take 1,000 mg by mouth every morning.    PREDNISONE (DELTASONE) 10 MG TABLET    Take 1 tablet (10 mg total) by mouth daily with breakfast.   RESVERATROL 100 MG CAPS    Take 2 capsules by mouth daily.   ROSUVASTATIN (CRESTOR) 5 MG TABLET    Take 5 mg by mouth at bedtime.  Modified Medications   No medications on file  Discontinued Medications   No medications on file

## 2015-09-02 NOTE — Progress Notes (Signed)
Quick Note:  CORRECTION: called spoke with patient's spouse. Advised of lab results / recs as stated by SN. Spouse voiced her understanding and denied any questions. ______

## 2015-09-02 NOTE — Progress Notes (Signed)
Quick Note:  Called spoke with patient's spouse. Advised of lab results / recs as stated by PM. Spouse verbalized understanding and denied any questions. ______

## 2015-09-02 NOTE — Progress Notes (Signed)
Quick Note:  Called spoke with patient's spouse. Advised of lab results / recs as stated by SN. Spouse verbalized understanding and denied any questions. ______

## 2015-09-02 NOTE — Progress Notes (Signed)
Quick Note:  CORRECTION: called spoke with patient's spouse. Advised of cxr results / recs as stated by SN. Spouse voiced her understanding and denied any questions. ______

## 2015-09-10 DIAGNOSIS — I10 Essential (primary) hypertension: Secondary | ICD-10-CM | POA: Diagnosis not present

## 2015-09-10 DIAGNOSIS — I714 Abdominal aortic aneurysm, without rupture: Secondary | ICD-10-CM | POA: Diagnosis not present

## 2015-09-10 DIAGNOSIS — I63511 Cerebral infarction due to unspecified occlusion or stenosis of right middle cerebral artery: Secondary | ICD-10-CM | POA: Diagnosis not present

## 2015-09-10 DIAGNOSIS — J309 Allergic rhinitis, unspecified: Secondary | ICD-10-CM | POA: Diagnosis not present

## 2015-09-10 DIAGNOSIS — J8410 Pulmonary fibrosis, unspecified: Secondary | ICD-10-CM | POA: Diagnosis not present

## 2015-09-10 DIAGNOSIS — R0902 Hypoxemia: Secondary | ICD-10-CM | POA: Diagnosis not present

## 2015-09-10 DIAGNOSIS — E782 Mixed hyperlipidemia: Secondary | ICD-10-CM | POA: Diagnosis not present

## 2015-09-11 ENCOUNTER — Telehealth: Payer: Self-pay | Admitting: Pulmonary Disease

## 2015-09-11 NOTE — Telephone Encounter (Signed)
Received request for records from [facility]. I faxed 29 pages which consist of all his appts this year. Rec'd confirmation from the fax that 29 pages were received

## 2015-09-17 DIAGNOSIS — I2510 Atherosclerotic heart disease of native coronary artery without angina pectoris: Secondary | ICD-10-CM | POA: Diagnosis not present

## 2015-09-17 DIAGNOSIS — J84112 Idiopathic pulmonary fibrosis: Secondary | ICD-10-CM | POA: Diagnosis not present

## 2015-10-15 ENCOUNTER — Emergency Department (HOSPITAL_COMMUNITY): Payer: Medicare Other

## 2015-10-15 ENCOUNTER — Encounter (HOSPITAL_COMMUNITY): Payer: Self-pay | Admitting: Emergency Medicine

## 2015-10-15 ENCOUNTER — Emergency Department (HOSPITAL_COMMUNITY)
Admission: EM | Admit: 2015-10-15 | Discharge: 2015-10-15 | Disposition: A | Discharge status: Home / Self Care | Payer: Medicare Other | Attending: Emergency Medicine | Admitting: Emergency Medicine

## 2015-10-15 DIAGNOSIS — R251 Tremor, unspecified: Secondary | ICD-10-CM | POA: Diagnosis not present

## 2015-10-15 DIAGNOSIS — Z79899 Other long term (current) drug therapy: Secondary | ICD-10-CM | POA: Diagnosis not present

## 2015-10-15 DIAGNOSIS — J8410 Pulmonary fibrosis, unspecified: Secondary | ICD-10-CM | POA: Diagnosis not present

## 2015-10-15 DIAGNOSIS — I639 Cerebral infarction, unspecified: Secondary | ICD-10-CM | POA: Diagnosis not present

## 2015-10-15 DIAGNOSIS — Z8673 Personal history of transient ischemic attack (TIA), and cerebral infarction without residual deficits: Secondary | ICD-10-CM | POA: Diagnosis not present

## 2015-10-15 DIAGNOSIS — N390 Urinary tract infection, site not specified: Secondary | ICD-10-CM

## 2015-10-15 DIAGNOSIS — I2510 Atherosclerotic heart disease of native coronary artery without angina pectoris: Secondary | ICD-10-CM | POA: Insufficient documentation

## 2015-10-15 DIAGNOSIS — I6789 Other cerebrovascular disease: Secondary | ICD-10-CM | POA: Diagnosis not present

## 2015-10-15 DIAGNOSIS — Z87891 Personal history of nicotine dependence: Secondary | ICD-10-CM | POA: Diagnosis not present

## 2015-10-15 DIAGNOSIS — Z8546 Personal history of malignant neoplasm of prostate: Secondary | ICD-10-CM | POA: Insufficient documentation

## 2015-10-15 DIAGNOSIS — R410 Disorientation, unspecified: Secondary | ICD-10-CM | POA: Diagnosis not present

## 2015-10-15 LAB — URINALYSIS, ROUTINE W REFLEX MICROSCOPIC
BILIRUBIN URINE: NEGATIVE
Glucose, UA: NEGATIVE mg/dL
HGB URINE DIPSTICK: NEGATIVE
KETONES UR: NEGATIVE mg/dL
NITRITE: POSITIVE — AB
PH: 7.5 (ref 5.0–8.0)
PROTEIN: NEGATIVE mg/dL
SPECIFIC GRAVITY, URINE: 1.018 (ref 1.005–1.030)

## 2015-10-15 LAB — URINE MICROSCOPIC-ADD ON

## 2015-10-15 LAB — COMPREHENSIVE METABOLIC PANEL
ALK PHOS: 51 U/L (ref 38–126)
ALT: 31 U/L (ref 17–63)
AST: 32 U/L (ref 15–41)
Albumin: 3.6 g/dL (ref 3.5–5.0)
Anion gap: 8 (ref 5–15)
BUN: 17 mg/dL (ref 6–20)
CALCIUM: 9.1 mg/dL (ref 8.9–10.3)
CHLORIDE: 103 mmol/L (ref 101–111)
CO2: 24 mmol/L (ref 22–32)
CREATININE: 1.05 mg/dL (ref 0.61–1.24)
GFR calc Af Amer: >60 mL/min (ref >60)
Glucose, Bld: 109 mg/dL — ABNORMAL HIGH (ref 65–99)
Potassium: 4.2 mmol/L (ref 3.5–5.1)
Sodium: 135 mmol/L (ref 135–145)
Total Bilirubin: 1.8 mg/dL — ABNORMAL HIGH (ref 0.3–1.2)
Total Protein: 7 g/dL (ref 6.5–8.1)

## 2015-10-15 LAB — CBG MONITORING, ED: GLUCOSE-CAPILLARY: 113 mg/dL — AB (ref 65–99)

## 2015-10-15 LAB — CBC WITH DIFFERENTIAL/PLATELET
BASOS ABS: 0 10*3/uL (ref 0.0–0.1)
Basophils Relative: 0 %
Eosinophils Absolute: 0.1 10*3/uL (ref 0.0–0.7)
Eosinophils Relative: 1 %
HCT: 40.9 % (ref 39.0–52.0)
Hemoglobin: 13.7 g/dL (ref 13.0–17.0)
Lymphocytes Relative: 10 %
Lymphs Abs: 1 10*3/uL (ref 0.7–4.0)
MCH: 31.4 pg (ref 26.0–34.0)
MCHC: 33.5 g/dL (ref 30.0–36.0)
MCV: 93.8 fL (ref 78.0–100.0)
MONO ABS: 0.7 10*3/uL (ref 0.1–1.0)
Monocytes Relative: 7 %
NEUTROS ABS: 8.2 10*3/uL — AB (ref 1.7–7.7)
Neutrophils Relative %: 82 %
Platelets: 187 10*3/uL (ref 150–400)
RBC: 4.36 MIL/uL (ref 4.22–5.81)
RDW: 15 % (ref 11.5–15.5)
WBC: 10 10*3/uL (ref 4.0–10.5)

## 2015-10-15 MED ORDER — CEPHALEXIN 500 MG PO CAPS: 500.0000 mg | ORAL_CAPSULE | Freq: Two times a day (BID) | ORAL | 0 refills | Status: DC

## 2015-10-15 MED ORDER — CEPHALEXIN 250 MG PO CAPS
500.0000 mg | ORAL_CAPSULE | Freq: Once | ORAL | Status: AC
  Administered 2015-10-15: 500 mg via ORAL
  Filled 2015-10-15: qty 2

## 2015-10-15 MED ORDER — SODIUM CHLORIDE 0.9 % IV BOLUS (SEPSIS)
1000.0000 mL | Freq: Once | INTRAVENOUS | Status: AC
  Administered 2015-10-15: 1000 mL via INTRAVENOUS

## 2015-10-15 MED ORDER — ACETAMINOPHEN 500 MG PO TABS
1000.0000 mg | ORAL_TABLET | Freq: Once | ORAL | Status: AC
  Administered 2015-10-15: 1000 mg via ORAL
  Filled 2015-10-15: qty 2

## 2015-10-15 NOTE — ED Triage Notes (Signed)
To ED via [facility] from home, with wife stating that pt's right hand and arm started shaking at 0930, and about an hour later per wife, his whole body was shaking, and was confused.

## 2015-10-15 NOTE — Discharge Instructions (Signed)
You were evaluated in the ED today and it would appear that the source of your symptoms is due to a urinary tract infection. You'll be treated for this with antibiotics. It is important for you to follow-up with your doctor next week for reevaluation, recheck of urine to ensure resolution of infection. He may also follow-up for further evaluation of the small lesion on your brain that we discussed. Return to ED for any new or worsening symptoms.

## 2015-10-15 NOTE — ED Provider Notes (Signed)
[facility] DEPT Provider Note   CSN: [id number] Arrival date & time: 10/15/15  1122  First Provider Contact:  None       History   Chief Complaint Chief Complaint  Patient presents with  . Tremors    HPI [name] [name] is a 82 y.o. 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 Wife is at bedside and reports that at approximately 9:30 AM, she noticed patient developed right-sided arm tremor. She reports at approximately 10:30 AM, his whole body was shaking and resolved upon arrival in ED. She does report that he has had confusion throughout the morning. He remained responsive throughout the event. No loss of consciousness, loss of bowel or bladder, intraoral trauma. Patient denies any headache, vision changes, unusual numbness or weakness, chest pain or shortness of breath, abdominal pain, urinary symptoms, diarrhea or constipation, back pain or any other medical symptoms. Wife reports the patient has had worsening cough over the past 1-1/2 weeks that is somewhat productive. Patient wears home oxygen 2 L. No other modifying factors.  HPI  Past Medical History:  Diagnosis Date  . AAA (abdominal aortic aneurysm) ([place])   . Allergy   . Carotid artery stenosis   . Diplopia   . Hyperlipidemia   . Prostate cancer ([place])   . Spondylisthesis 2015   Spine  . Stroke ([place]) 04/18/13   some weakness left-returning strength  . TIA (transient ischemic attack)     Patient Active Problem List   Diagnosis Date Noted  . IPF (idiopathic pulmonary fibrosis) ([place]) 07/11/2015  . H/O endovascular stent graft for abdominal aortic aneurysm 07/11/2015  . H/O carotid endarterectomy 07/11/2015  . History of stroke 05/06/2015  . Swelling of ankle joint 04/23/2014  . Memory loss 05/29/2013  . Mild cognitive impairment 05/29/2013  . CAD (coronary artery disease) 05/25/2013  . AAA (abdominal aortic aneurysm) without  rupture ([place]) 05/15/2013  . Occlusion and stenosis of carotid artery with cerebral infarction 04/19/2013  . CVA (cerebral infarction) 04/18/2013  . Other and unspecified hyperlipidemia 04/18/2013  . Carotid stenosis 04/18/2013  . Abdominal aneurysm without mention of rupture 04/18/2012  . Occlusion and stenosis of carotid artery without mention of cerebral infarction 10/12/2011    Past Surgical History:  Procedure Laterality Date  . CAROTID ENDARTERECTOMY  Sept. 2008   Elective left carotid endarterectomy  . CAROTID ENDARTERECTOMY  fEB. 6, 2015   CE  . ENDARTERECTOMY Right 04/27/2013   Procedure: ENDARTERECTOMY CAROTID;  Surgeon: Mal Misty, MD;  Location: [facility];  Service: Vascular;  Laterality: Right;  . EYE SURGERY     Bilateral cataract   . FOOT SURGERY      left Achilles Tendon Repair  . seed implantation  2007   for prostate ca       Home Medications    Prior to Admission medications   Medication Sig Start Date End Date Taking? Authorizing Provider  azelastine (ASTELIN) 0.1 % nasal spray Place 1 spray into both nostrils 2 (two) times daily.  01/08/15  Yes Historical Provider, MD  cetirizine (ZYRTEC) 10 MG tablet Take 10 mg by mouth daily.   Yes Historical Provider, MD  clopidogrel (PLAVIX) 75 MG tablet Take 1 tablet (75 mg total) by mouth daily. 06/02/15  Yes Dennie Bible, NP  L-Methylfolate-B12-B6-B2 (CEREFOLIN PO) Take 1 tablet by mouth daily.   Yes Historical Provider, MD  Multiple Vitamins-Minerals (CENTRUM SILVER PO) Take 1 tablet  by mouth daily.    Yes Historical Provider, MD  Omega-3 Fatty Acids (FISH OIL) 1000 MG CPDR Take 1,000 mg by mouth every morning.    Yes Historical Provider, MD  predniSONE (DELTASONE) 10 MG tablet Take 1 tablet (10 mg total) by mouth daily with breakfast. 07/11/15  Yes Noralee Space, MD  Resveratrol 100 MG CAPS Take 2 capsules by mouth daily.   Yes Historical Provider, MD  rosuvastatin (CRESTOR) 5 MG tablet Take 5 mg by mouth at  bedtime.   Yes Historical Provider, MD  cephALEXin (KEFLEX) 500 MG capsule Take 1 capsule (500 mg total) by mouth 2 (two) times daily. 10/15/15   Comer Locket, PA-C    Family History Family History  Problem Relation Age of Onset  . Stroke Mother   . Heart disease Mother     After age 30  . Varicose Veins Mother   . Hypertension Mother   . Cancer Mother     Theadora Rama   . Stroke Father   . Heart attack Father   . Deep vein thrombosis Father   . Anuerysm Father   . Heart disease Brother     Social History Social History  Substance Use Topics  . Smoking status: Former Smoker    Packs/day: 1.00    Years: 14.00    Types: Cigarettes    Quit date: 03/27/1965  . Smokeless tobacco: Never Used     Comment: occ alcohol  . Alcohol use 0.0 oz/week     Comment: 3-4 drinks weekly     Allergies   Sudafed [pseudoephedrine hcl] and Atorvastatin   Review of Systems Review of Systems A 10 point review of systems was completed and was negative except for pertinent positives and negatives as mentioned in the history of present illness    Physical Exam Updated Vital Signs BP 117/58 (BP Location: Right Arm)   Pulse 71   Temp (S) 98.7 F (37.1 C) (Oral)   Resp 22   SpO2 92%   Physical Exam  Constitutional: He appears well-developed. No distress.  Awake, alert and nontoxic in appearance  HENT:  Head: Normocephalic and atraumatic.  Right Ear: External ear normal.  Left Ear: External ear normal.  Mouth/Throat: Oropharynx is clear and moist.  Eyes: Conjunctivae and EOM are normal. Pupils are equal, round, and reactive to light.  Neck: Normal range of motion. No JVD present.  Cardiovascular: Normal rate, regular rhythm and normal heart sounds.   Pulmonary/Chest: Effort normal and breath sounds normal. No stridor.  Abdominal: Soft. There is no tenderness.  Musculoskeletal: Normal range of motion.  Neurological:  Awake, alert, cooperative and aware of situation; motor strength  bilaterally; sensation normal to light touch bilaterally; no facial asymmetry; tongue midline; major cranial nerves appear intact;  baseline gait without new ataxia. Wife reports the patient is word searching which is not usual for him.  Skin: No rash noted. He is not diaphoretic.  Psychiatric: He has a normal mood and affect. His behavior is normal. Thought content normal.  Nursing note and vitals reviewed.    ED Treatments / Results  Labs (all labs ordered are listed, but only abnormal results are displayed) Labs Reviewed  CBC WITH DIFFERENTIAL/PLATELET - Abnormal; Notable for the following:       Result Value   Neutro Abs 8.2 (*)    All other components within normal limits  COMPREHENSIVE METABOLIC PANEL - Abnormal; Notable for the following:    Glucose, Bld 109 (*)  Total Bilirubin 1.8 (*)    All other components within normal limits  URINALYSIS, ROUTINE W REFLEX MICROSCOPIC (NOT AT [facility]) - Abnormal; Notable for the following:    APPearance HAZY (*)    Nitrite POSITIVE (*)    Leukocytes, UA SMALL (*)    All other components within normal limits  URINE MICROSCOPIC-ADD ON - Abnormal; Notable for the following:    Squamous Epithelial / LPF 0-5 (*)    Bacteria, UA MANY (*)    All other components within normal limits  CBG MONITORING, ED - Abnormal; Notable for the following:    Glucose-Capillary 113 (*)    All other components within normal limits  URINE CULTURE    EKG  EKG Interpretation None       Radiology Dg Chest 2 View  Result Date: 10/15/2015 CLINICAL DATA:  Tremors and confusion EXAM: CHEST  2 VIEW COMPARISON:  August 27, 2015 FINDINGS: Pulmonary fibrotic changes noted bilaterally, slightly more severe on the left than on the right. There is no frank edema or consolidation. The heart size and pulmonary vascularity are normal. No adenopathy. No bone lesions. IMPRESSION: Stable changes of interstitial fibrosis bilaterally, slightly more severe on the left than on the  right. No new opacity. No change in cardiac silhouette. Electronically Signed   By: Lowella Grip III M.D.   On: 10/15/2015 12:56  Ct Head Wo Contrast  Result Date: 10/15/2015 CLINICAL DATA:  Acute onset right hand tremor. Intermittent confusion EXAM: CT HEAD WITHOUT CONTRAST TECHNIQUE: Contiguous axial images were obtained from the base of the skull through the vertex without intravenous contrast. COMPARISON:  Head CT April 18, 2013; brain MRI April 19, 2013 FINDINGS: Brain: Mild diffuse atrophy is stable. There is no intracranial mass, hemorrhage, extra-axial fluid collection, or midline shift. There is mild patchy small vessel disease in the centra semiovale bilaterally which was present previously. There is a focal area of decreased attenuation in the lateral high right corona radiata which is consistent with a small age uncertain and possibly recent infarct. No other new gray-white compartment lesion identified. Vascular: No hyperdense vessels are evident. There are foci of calcification in each cavernous carotid artery. Skull: The bony calvarium appears intact. Sinuses/Orbits: There is a small air-fluid level within the right maxillary antrum. There is opacification in several ethmoid air cells bilaterally. Visualized orbits appear symmetric bilaterally. Other: Mastoid air cells are clear. IMPRESSION: Small age uncertain and possibly recent infarct in the periphery of the high right corona radiata. Elsewhere there is stable fairly mild periventricular small vessel disease. There is mild underlying atrophy. No hemorrhage or mass effect. Areas of paranasal sinus disease. Foci of carotid artery calcification noted. Electronically Signed   By: Lowella Grip III M.D.   On: 10/15/2015 12:45   Procedures Procedures (including critical care time)  Medications Ordered in ED Medications  sodium chloride 0.9 % bolus 1,000 mL (0 mLs Intravenous Stopped 10/15/15 1457)  acetaminophen (TYLENOL) tablet  1,000 mg (1,000 mg Oral Given 10/15/15 1214)  cephALEXin (KEFLEX) capsule 500 mg (500 mg Oral Given 10/15/15 1505)     Initial Impression / Assessment and Plan / ED Course  I have reviewed the triage vital signs and the nursing notes.  Pertinent labs & imaging results that were available during my care of the patient were reviewed by me and considered in my medical decision making (see chart for details).  Clinical Course    Patient presents for evaluation of tremor. On arrival he is hemodynamically  stable, found to have fever of 103F. Given Tylenol 1000 mg. He does not appear in any distress. He has a nonfocal neuro exam nontender abdomen. Physical exam otherwise unremarkable. Clinical presentation not consistent with seizure, patient remained alert and oriented throughout episode. Given new presentation of fever, we will obtain chest x-ray due to patient's history of pulmonary fibrosis and cough. Screening labs, urine. CT head. CT head shows age indeterminate small infarct of corona radiata. I do not necessarily feel this is related to patient's symptoms today. Discussed results with patient and appropriate follow-up with PCP. Urinalysis shows evidence of infection. Urine culture obtained. We'll treat with Keflex. This is the likely source of patient's previous confusion. Discussed follow-up with PCP next week for reevaluation, recheck of urine to ensure resolution. Discussed strict return precautions with patient, family at bedside, they verbalized understanding, agreed with this plan and subsequent discharge. Voices no other questions or concerns at this time. Prior to patient discharge, I discussed and reviewed this case with Dr. Wilson Singer  Final Clinical Impressions(s) / ED Diagnoses   Final diagnoses:  UTI (lower urinary tract infection)    New Prescriptions Discharge Medication List as of 10/15/2015  2:47 PM    START taking these medications   Details  cephALEXin (KEFLEX) 500 MG  capsule Take 1 capsule (500 mg total) by mouth 2 (two) times daily., Starting Wed 10/15/2015, Print         [place], PA-C 10/15/15 [place], MD 10/16/15 [phone]

## 2015-10-17 DIAGNOSIS — J84112 Idiopathic pulmonary fibrosis: Secondary | ICD-10-CM | POA: Diagnosis not present

## 2015-10-17 DIAGNOSIS — I2510 Atherosclerotic heart disease of native coronary artery without angina pectoris: Secondary | ICD-10-CM | POA: Diagnosis not present

## 2015-10-17 LAB — URINE CULTURE: Culture: >=100000 — AB

## 2015-10-18 ENCOUNTER — Telehealth (HOSPITAL_BASED_OUTPATIENT_CLINIC_OR_DEPARTMENT_OTHER): Payer: Self-pay

## 2015-10-18 NOTE — Telephone Encounter (Signed)
Post ED Visit - Positive Culture Follow-up  Culture report reviewed by antimicrobial stewardship pharmacist:  []  Elenor Quinones, Pharm.D. []  Heide Guile, Pharm.D., BCPS []  Parks Neptune, Pharm.D. []  Alycia Rossetti, Pharm.D., BCPS []  [place], Pharm.D., BCPS, AAHIVP []  Legrand Como, Pharm.D., BCPS, AAHIVP []  Milus Glazier, Pharm.D. []  Stephens November, [place].D. Ebony Hail Masters Pharm D Positive urine culture Treated with Cephalexin, organism sensitive to the same and no further patient follow-up is required at this time.  Genia Del 10/18/2015, 9:43 AM

## 2015-10-22 ENCOUNTER — Telehealth: Payer: Self-pay | Admitting: Cardiovascular Disease

## 2015-10-22 NOTE — Telephone Encounter (Signed)
New message    Request for surgical clearance:  What type of surgery is being performed? extraction 1. When is this surgery scheduled? Not scheduled  2. Are there any medications that need to be held prior to surgery and how long? plavix for 3-4 days  3. Name of physician performing surgery? Dr. Jolayne Panther  What is your office phone and fax number? [phone], [phone]

## 2015-10-22 NOTE — Telephone Encounter (Signed)
Ok for extraction and can hold plavix 5 days before

## 2015-10-22 NOTE — Telephone Encounter (Signed)
Will fax to Dr. Marlis Edelson office.

## 2015-10-24 DIAGNOSIS — N390 Urinary tract infection, site not specified: Secondary | ICD-10-CM | POA: Diagnosis not present

## 2015-10-28 DIAGNOSIS — N390 Urinary tract infection, site not specified: Secondary | ICD-10-CM | POA: Diagnosis not present

## 2015-11-04 ENCOUNTER — Ambulatory Visit (INDEPENDENT_AMBULATORY_CARE_PROVIDER_SITE_OTHER): Payer: Medicare Other | Admitting: Nurse Practitioner

## 2015-11-04 ENCOUNTER — Encounter: Payer: Self-pay | Admitting: Nurse Practitioner

## 2015-11-04 VITALS — BP 108/54 | HR 72 | Ht 69.0 in | Wt 184.0 lb

## 2015-11-04 DIAGNOSIS — Z8673 Personal history of transient ischemic attack (TIA), and cerebral infarction without residual deficits: Secondary | ICD-10-CM | POA: Diagnosis not present

## 2015-11-04 DIAGNOSIS — R413 Other amnesia: Secondary | ICD-10-CM

## 2015-11-04 DIAGNOSIS — I2510 Atherosclerotic heart disease of native coronary artery without angina pectoris: Secondary | ICD-10-CM | POA: Diagnosis not present

## 2015-11-04 NOTE — Progress Notes (Signed)
[facility]  PATIENT: [name] [name] DOB: May 12, 1929   REASON FOR VISIT: Follow-up for mild cognitive impairment history of stroke January 2015 HISTORY FROM: Patient and wife [name] [name]    HISTORY OF PRESENT ILLNESS: HISTORY 24 year Caucasian male seen for first office followup visit from hospital consideration for stroke in January 2015. He presented with difficulty picking things up with his left hand as well as grip weakness while playing golf. He also subsequently diescribed, headache and some speech and language difficulties and double vision. His symptoms improved soon after arrival and hence he was not considered for thrombolysis. CT scan of the head showed an area of low attenuation in the right anterior limb internal capsule suggestive of a subacute lacunar infarct which was subsequently confirmed on MRI . 2D echo showed ejection fraction of 45-50% with diffuse hypokinesis. Carotid Doppler showed progressive 80-99% right ICA stenosis and 1-39% left ICA stenosis at the previous surgical site. Vascular surgery was consulted . Patient was started on aspirin. Patient was scheduled for and underwent elective right carotid endarterectomy by Dr Kellie Simmering and the procedure went well. Patient was placed on Plavix by me but after the surgery was changed to aspirin. He states his blood pressure is fine and is 103/65 today and it usually runs in the 120s at home. The patient's wife has noted that he has subacute decline in his memory as well as multitasking and cognitive abilities. She is concerned about this and would like evaluation. The patient himself denies this. He has not had an evaluation for these complaints before.   UPDATE 08/27/13 (LL): Since last visit, patient has continued taking Cerefolin NAC for memory. He does not notice any change in his memory, but states he knows his short term memory is not as good as it used to be. He is tolerating Plavix well with no signs of significant  bleeding or bruising. His blood pressure is well controlled, it is 125/79 in the office today. He states he stays active, plays golf a couple days a week. Update 05/10/2014 PShe returns for follow-up accompanied by his wife. He states his memory difficulties are unchanged. He in fact is doing well and is still able to handle finances for the home and business. He is taking fish oil as well as Cerefolin everyday. The patient also remains on Plavix which is tolerating well without bleeding or bruising. He had lipid profile checked in December 2015 by primary physician which was fine. He also had follow-up carotid ultrasound done last week by Dr. Kellie Simmering which was okay as well. He exercises regularly and plays golf as well. He has no new complaints. He has not had any recurrent stroke or TIA symptoms. Update 11/05/2014:PS He returns for follow-up after last visit 6 months ago. He is accompanied by his wife. He states his short-term memory difficulties continue. He has occasional fecal times in judgment and multitasking. However overall his symptoms seem stable. He continues to take Cerefolin daily. He does participate in doing sudoku several times a week. He does play golf twice a week. He did have a follow-up carotid ultrasound checked in February and Dr. Evelena Leyden office and it was fine. He remains on Plavix which is tolerating well without bleeding or bruising. He has not had any recurrent stroke or TIA symptoms. He remains on Crestor without any myalgias or arthralgias. His blood pressure is elevated today in office at 150/93 but he blames this on white coat hypertension. He did have lipid profile checked  2 months ago by Dr. Laurann Montana and it was fine.  UPDATE 05/06/2015 CMMr. [name] [name], 82 year old male returns for follow-up. He is accompanied by his wife [name] [name]. He has not had further stroke or TIA symptoms since last seen. He is currently on Plavix with minimal bruising. His memory has not progressed since last seen. He  continues to play golf several times a week and participates in card games, and sudoku. He remains on Cerefolin. His lipids are followed by primary care and he remains on Crestor without side effects. Carotid Dopplers followed yearly by Dr. Kellie Simmering. He returns for reevaluation UPDATE 08/15/2017CM [name], 82 year old returns for follow-up with his wife. He has a history of stroke in January 2015. He has not had further stroke or TIA symptoms since that time he is currently on Plavix with minimal bruising. His memory is stable, MMSE 27/30. He continues to play golf twice a week and participate in other activities to stimulate his mind. He remains on Cerefolin. He was recently diagnosed with pulmonary fibrosis and is using oxygen at night. He remains on Crestor and lipids are followed by primary care Dr. Laurann Montana.  Carotid Doppler is followed by [facility]. Doppler is due to February 2018. He returns for reevaluation  REVIEW OF SYSTEMS: Full 14 system review of systems performed and notable only for those listed, all others are neg:  Constitutional: neg  Cardiovascular: neg Ear/Nose/Throat: neg  Skin: neg Eyes: neg Respiratory: Cough , shortness of breath  Gastroitestinal: Urinary frequency  Hematology/Lymphatic: neg  Endocrine: neg Musculoskeletal:neg Allergy/Immunology: neg Neurological: Memory loss Psychiatric: neg Sleep : neg   ALLERGIES: Allergies  Allergen Reactions  . Sudafed [Pseudoephedrine Hcl]     Causes dizziness and nervousness  . Atorvastatin Other (See Comments)    Reaction: Patient's family reports "confusion" and would prefer crestor     HOME MEDICATIONS: Outpatient Medications Prior to Visit  Medication Sig Dispense Refill  . azelastine (ASTELIN) 0.1 % nasal spray Place 1 spray into both nostrils 2 (two) times daily.   4  . cetirizine (ZYRTEC) 10 MG tablet Take 10 mg by mouth daily.    . clopidogrel (PLAVIX) 75 MG tablet Take 1 tablet (75 mg total) by mouth daily. 90  tablet 3  . L-Methylfolate-B12-B6-B2 (CEREFOLIN PO) Take 1 tablet by mouth daily.    . Multiple Vitamins-Minerals (CENTRUM SILVER PO) Take 1 tablet by mouth daily.     . Omega-3 Fatty Acids (FISH OIL) 1000 MG CPDR Take 1,000 mg by mouth every morning.     . predniSONE (DELTASONE) 10 MG tablet Take 1 tablet (10 mg total) by mouth daily with breakfast. 50 tablet 2  . Resveratrol 100 MG CAPS Take 2 capsules by mouth daily.    . rosuvastatin (CRESTOR) 5 MG tablet Take 5 mg by mouth at bedtime.    . cephALEXin (KEFLEX) 500 MG capsule Take 1 capsule (500 mg total) by mouth 2 (two) times daily. 20 capsule 0   No facility-administered medications prior to visit.     PAST MEDICAL HISTORY: Past Medical History:  Diagnosis Date  . AAA (abdominal aortic aneurysm) ([place])   . Allergy   . Carotid artery stenosis   . Diplopia   . Hyperlipidemia   . Prostate cancer ([place])   . Spondylisthesis 2015   Spine  . Stroke ([place]) 04/18/13   some weakness left-returning strength  . TIA (transient ischemic attack)     PAST SURGICAL HISTORY: Past Surgical History:  Procedure Laterality Date  . CAROTID ENDARTERECTOMY  Sept. 2008   Elective left carotid endarterectomy  . CAROTID ENDARTERECTOMY  fEB. 6, 2015   CE  . ENDARTERECTOMY Right 04/27/2013   Procedure: ENDARTERECTOMY CAROTID;  Surgeon: Mal Misty, MD;  Location: [facility];  Service: Vascular;  Laterality: Right;  . EYE SURGERY     Bilateral cataract   . FOOT SURGERY      left Achilles Tendon Repair  . seed implantation  2007   for prostate ca    FAMILY HISTORY: Family History  Problem Relation Age of Onset  . Stroke Mother   . Heart disease Mother     After age 32  . Varicose Veins Mother   . Hypertension Mother   . Cancer Mother     Theadora Rama   . Stroke Father   . Heart attack Father   . Deep vein thrombosis Father   . Anuerysm Father   . Heart disease Brother     SOCIAL HISTORY: Social History   Social History  . Marital status:  Married    Spouse name: lee  . Number of children: 2  . Years of education: college   Occupational History  . retired Retired    Programme researcher, broadcasting/film/video    Social History Main Topics  . Smoking status: Former Smoker    Packs/day: 1.00    Years: 14.00    Types: Cigarettes    Quit date: 03/27/1965  . Smokeless tobacco: Never Used     Comment: occ alcohol  . Alcohol use 0.0 oz/week     Comment: 3-4 drinks weekly  . Drug use: No  . Sexual activity: Not Currently   Other Topics Concern  . Not on file   Social History Narrative  . No narrative on file     PHYSICAL EXAM  Vitals:   11/04/15 1123  BP: (!) 108/54  Pulse: 72  Weight: 184 lb (83.5 kg)  Height: 5\' 9"  (1.753 m)   Body mass index is 27.17 kg/m. General: well developed, well nourished elderly Caucasian male, seated, in no evident distress  Head: head normocephalic and atraumatic.  Neck: supple without carotid bruit, previous bil CE scars  Cardiovascular: regular rate and rhythm, no murmurs Lungs clear to auscultation  Musculoskeletal: no deformity  Skin: no rash/petichiae  Vascular: Normal pulses all extremities   Neurologic Exam  Mental Status: Awake and fully alert. Oriented to place and time.Diminished immediate recall.Remote memory intact. Attention span, concentration and fund of knowledge appropriate. Mood and affect appropriate. MMSE 27/30. AFT 5. Clock drawing 4/4.  Cranial Nerves: Fundoscopic exam not done. Pupils equal, briskly reactive to light. Extraocular movements full without nystagmus. Visual fields full to confrontation. Hearing intact. Facial sensation intact. Face, tongue, palate moves normally and symmetrically.  Motor: Normal bulk and tone. Normal strength in all tested extremity muscles.   Sensory: intact to light touch , pinprick and vibratory in all 4 extremities. Coordination: Rapid alternating movements normal in all extremities. Finger-to-nose and heel-to-shin performed  accurately bilaterally.  Gait and Station: Arises from chair without difficulty. Stance is normal. Gait demonstrates normal stride length and balance. Able to heel, toe and tandem walk with mild difficulty. No assistive device Reflexes: 1+ and symmetric.   DIAGNOSTIC DATA (LABS, IMAGING, TESTING) - ASSESSMENT AND PLAN 86 year with right posterior frontal infarct in January 2015 secondary to symptomatic right ICA stenosis for which he has undergone elective right carotid endarterectomy. Multiple vascular risk factors of carotid stenosis, hyperlipidemia, peripheral vascular disease and history of TIA.  He also has pre-existing memory loss which is likely mild cognitive impairment which appears stable.  Continue Plavix for secondary stroke prevention will refill control of hypertension with blood pressure goal below 130/80 today's reading 108 /54  hemoglobin A1c goal below 6.5%  lipids with LDL cholesterol goal below 100 mg/dL.  Continue follow-up with vascular surgery for his carotid ultrasounds.Dr. Kellie Simmering  Continue Cerefolin for mild cognitive impairment as well as  Reservetarol 250 mg daily.  Followup in the future in 6 months or call earlier if necessary, next with Dr. Sloan Leiter, [facility], [facility], [facility] [address] [place], [place] [address] [phone]

## 2015-11-04 NOTE — Patient Instructions (Addendum)
Continue Plavix for secondary stroke prevention will refill control of hypertension with blood pressure goal below 130/80   hemoglobin A1c goal below 6.5%  lipids with LDL cholesterol goal below 100 mg/dL.  Continue follow-up with vascular surgery for his carotid ultrasounds  Continue Cerefolin for mild cognitive impairment as well as  Reservetarol 250 mg daily.  Followup in the future in 6 months or call earlier if necessary, next with Dr. Leonie Man

## 2015-11-10 NOTE — Progress Notes (Signed)
Fax confirmation received for ofv note to Dr. Laurann Montana [phone].

## 2015-11-11 NOTE — Progress Notes (Signed)
I agree with the above plan 

## 2015-11-17 DIAGNOSIS — I2510 Atherosclerotic heart disease of native coronary artery without angina pectoris: Secondary | ICD-10-CM | POA: Diagnosis not present

## 2015-11-17 DIAGNOSIS — J84112 Idiopathic pulmonary fibrosis: Secondary | ICD-10-CM | POA: Diagnosis not present

## 2015-12-03 ENCOUNTER — Ambulatory Visit (INDEPENDENT_AMBULATORY_CARE_PROVIDER_SITE_OTHER): Payer: Medicare Other | Admitting: Pulmonary Disease

## 2015-12-03 VITALS — BP 134/80 | HR 69 | Temp 97.1°F | Ht 69.0 in | Wt 185.4 lb

## 2015-12-03 DIAGNOSIS — I6523 Occlusion and stenosis of bilateral carotid arteries: Secondary | ICD-10-CM

## 2015-12-03 DIAGNOSIS — R0902 Hypoxemia: Secondary | ICD-10-CM | POA: Diagnosis not present

## 2015-12-03 DIAGNOSIS — J84112 Idiopathic pulmonary fibrosis: Secondary | ICD-10-CM

## 2015-12-03 DIAGNOSIS — I714 Abdominal aortic aneurysm, without rupture, unspecified: Secondary | ICD-10-CM

## 2015-12-03 DIAGNOSIS — I2510 Atherosclerotic heart disease of native coronary artery without angina pectoris: Secondary | ICD-10-CM

## 2015-12-03 DIAGNOSIS — Z9889 Other specified postprocedural states: Secondary | ICD-10-CM

## 2015-12-03 DIAGNOSIS — R413 Other amnesia: Secondary | ICD-10-CM

## 2015-12-03 DIAGNOSIS — Z95828 Presence of other vascular implants and grafts: Secondary | ICD-10-CM

## 2015-12-03 NOTE — Patient Instructions (Signed)
Today we updated your med list in our EPIC system...    Continue your current medications the same...  Today we gave you the 2017 Flu vaccine  Stay as active as poss & continue engaging your mind via reading, puzzles, etc...  Call for any questions...  Let's plan a follow up visit in 72mo, sooner if needed for breathing problems.Marland KitchenMarland Kitchen

## 2015-12-11 ENCOUNTER — Other Ambulatory Visit: Payer: Self-pay | Admitting: Pulmonary Disease

## 2015-12-18 DIAGNOSIS — I2510 Atherosclerotic heart disease of native coronary artery without angina pectoris: Secondary | ICD-10-CM | POA: Diagnosis not present

## 2015-12-18 DIAGNOSIS — J84112 Idiopathic pulmonary fibrosis: Secondary | ICD-10-CM | POA: Diagnosis not present

## 2016-01-17 DIAGNOSIS — I2510 Atherosclerotic heart disease of native coronary artery without angina pectoris: Secondary | ICD-10-CM | POA: Diagnosis not present

## 2016-01-17 DIAGNOSIS — J84112 Idiopathic pulmonary fibrosis: Secondary | ICD-10-CM | POA: Diagnosis not present

## 2016-01-29 DIAGNOSIS — L814 Other melanin hyperpigmentation: Secondary | ICD-10-CM | POA: Diagnosis not present

## 2016-01-29 DIAGNOSIS — L570 Actinic keratosis: Secondary | ICD-10-CM | POA: Diagnosis not present

## 2016-01-29 DIAGNOSIS — L821 Other seborrheic keratosis: Secondary | ICD-10-CM | POA: Diagnosis not present

## 2016-01-29 DIAGNOSIS — Z85828 Personal history of other malignant neoplasm of skin: Secondary | ICD-10-CM | POA: Diagnosis not present

## 2016-01-29 DIAGNOSIS — D225 Melanocytic nevi of trunk: Secondary | ICD-10-CM | POA: Diagnosis not present

## 2016-02-10 ENCOUNTER — Ambulatory Visit (INDEPENDENT_AMBULATORY_CARE_PROVIDER_SITE_OTHER): Payer: Medicare Other | Admitting: Pulmonary Disease

## 2016-02-10 VITALS — BP 140/88 | HR 74 | Temp 97.2°F | Ht 69.0 in | Wt 188.2 lb

## 2016-02-10 DIAGNOSIS — Z9889 Other specified postprocedural states: Secondary | ICD-10-CM

## 2016-02-10 DIAGNOSIS — I714 Abdominal aortic aneurysm, without rupture, unspecified: Secondary | ICD-10-CM

## 2016-02-10 DIAGNOSIS — R0902 Hypoxemia: Secondary | ICD-10-CM

## 2016-02-10 DIAGNOSIS — I2510 Atherosclerotic heart disease of native coronary artery without angina pectoris: Secondary | ICD-10-CM

## 2016-02-10 DIAGNOSIS — Z95828 Presence of other vascular implants and grafts: Secondary | ICD-10-CM

## 2016-02-10 DIAGNOSIS — I6523 Occlusion and stenosis of bilateral carotid arteries: Secondary | ICD-10-CM

## 2016-02-10 DIAGNOSIS — J84112 Idiopathic pulmonary fibrosis: Secondary | ICD-10-CM | POA: Diagnosis not present

## 2016-02-10 DIAGNOSIS — R413 Other amnesia: Secondary | ICD-10-CM

## 2016-02-10 MED ORDER — HYDROCODONE-HOMATROPINE 5-1.5 MG/5ML PO SYRP: 5.0000 mL | ORAL_SOLUTION | Freq: Four times a day (QID) | ORAL | 0 refills | Status: DC | PRN

## 2016-02-10 MED ORDER — METHYLPREDNISOLONE ACETATE 80 MG/ML IJ SUSP
80.0000 mg | Freq: Once | INTRAMUSCULAR | Status: AC
  Administered 2016-02-10: 80 mg via INTRAMUSCULAR

## 2016-02-10 MED ORDER — PANTOPRAZOLE SODIUM 40 MG PO TBEC: 40.0000 mg | DELAYED_RELEASE_TABLET | Freq: Every day | ORAL | 6 refills | Status: DC

## 2016-02-10 MED ORDER — LEVOFLOXACIN 500 MG PO TABS: 500.0000 mg | ORAL_TABLET | Freq: Every day | ORAL | 0 refills | Status: DC

## 2016-02-10 NOTE — Patient Instructions (Addendum)
Today we updated your med list in our EPIC system...    Continue your current medications the same...  For this exacerbation>  We gave you a Depo shot today...    Increase the PREDNISONE to 10mg  TWICE daily for 2 weeks...    Then return to 10mg  each AM thereafter...  We also started an Antibiotic- LEVAQUIN 500mg  one tab daily x7d til gone...    Be sure to take a probiotic as well eg- ALIGN one cap daily...  For the nocturnal cough & ?reflux>      Start the PROTONIX (Pantoprazole) 40mg  taken about 30 min before the evening meal...    Do not eat or drink much after dinner inn the evening...    Elevate the head of your bed on 6" blocks...  For the cough itself>  We wrote fro HYDROMET cough syrup- one tsp every [place] as needed...    Continue to use the cough drops, throat lozenges, sugarless gum, etc...  Let's plan to keep our prev sched f/u appt in Jan2018

## 2016-02-17 DIAGNOSIS — J84112 Idiopathic pulmonary fibrosis: Secondary | ICD-10-CM | POA: Diagnosis not present

## 2016-02-17 DIAGNOSIS — I2510 Atherosclerotic heart disease of native coronary artery without angina pectoris: Secondary | ICD-10-CM | POA: Diagnosis not present

## 2016-03-18 DIAGNOSIS — I2510 Atherosclerotic heart disease of native coronary artery without angina pectoris: Secondary | ICD-10-CM | POA: Diagnosis not present

## 2016-03-18 DIAGNOSIS — J84112 Idiopathic pulmonary fibrosis: Secondary | ICD-10-CM | POA: Diagnosis not present

## 2016-03-23 ENCOUNTER — Encounter: Payer: Self-pay | Admitting: Cardiovascular Disease

## 2016-03-23 DIAGNOSIS — I6521 Occlusion and stenosis of right carotid artery: Secondary | ICD-10-CM | POA: Diagnosis not present

## 2016-03-23 DIAGNOSIS — I10 Essential (primary) hypertension: Secondary | ICD-10-CM | POA: Diagnosis not present

## 2016-03-23 DIAGNOSIS — Z8546 Personal history of malignant neoplasm of prostate: Secondary | ICD-10-CM | POA: Diagnosis not present

## 2016-03-23 DIAGNOSIS — J8410 Pulmonary fibrosis, unspecified: Secondary | ICD-10-CM | POA: Diagnosis not present

## 2016-03-23 DIAGNOSIS — E782 Mixed hyperlipidemia: Secondary | ICD-10-CM | POA: Diagnosis not present

## 2016-03-23 DIAGNOSIS — R0902 Hypoxemia: Secondary | ICD-10-CM | POA: Diagnosis not present

## 2016-03-23 DIAGNOSIS — J309 Allergic rhinitis, unspecified: Secondary | ICD-10-CM | POA: Diagnosis not present

## 2016-03-23 DIAGNOSIS — I714 Abdominal aortic aneurysm, without rupture: Secondary | ICD-10-CM | POA: Diagnosis not present

## 2016-03-23 DIAGNOSIS — Z0000 Encounter for general adult medical examination without abnormal findings: Secondary | ICD-10-CM | POA: Diagnosis not present

## 2016-03-23 DIAGNOSIS — I63511 Cerebral infarction due to unspecified occlusion or stenosis of right middle cerebral artery: Secondary | ICD-10-CM | POA: Diagnosis not present

## 2016-03-25 NOTE — Progress Notes (Signed)
Patient ID: [name] [name], male   DOB: May 16, 1929, 83 y.o.   MRN: [id number] 83 y.o.  referred by Dr Kellie Simmering for vascular disease. Recent hospitalization for stroke History of AAA and left CEA Had TIA and had RCE done  Denies history of CAD. Has not had stress test before Still golfing Moves a bit slow. No chest pain Mild exertional dsypnea.  Has had small hematoma post CEA Improving On asa and statin Denies claudication. Echo in hospital had no discrete RWMA but diffuse hypokinesis EF 45% Reviewed  FU myovue was non ischemic 05/27/13  EF 56%   Seen by Dr Kellie Simmering post CEA and 4.1 cm AAA  All stable  09/2013   Having some memory issues  Gets dehydrated on golf course  Urinary frequency due to prostate cancer and he does not like to drink Some post nasal drip and allergies    CT 07/01/15  ILD with bronchiectasis moderate honeycombing UIP Wife having a hard time adjusting to his medical illnesses especially ILD and dementia  ROS: Denies fever, malais, weight loss, blurry vision, decreased visual acuity, cough, sputum, SOB, hemoptysis, pleuritic pain, palpitaitons, heartburn, abdominal pain, melena, lower extremity edema, claudication, or rash.  All other systems reviewed and negative  General: Affect appropriate Elderly male  HEENT: normal Neck supple with no adenopathy JVP normal Previous CEAls but  no bruits no thyromegaly Lungs pockets of wheezing and velcro crackles at base  Heart:  S1/S2 no murmur, no rub, gallop or click PMI normal Abdominal aorta easily palpable and mildly enlarged no pain  no bruit.  No HSM or HJR Distal pulses intact with no bruits No edema Neuro non-focal Skin warm and dry No muscular weakness   Current Outpatient Prescriptions  Medication Sig Dispense Refill  . azelastine (ASTELIN) 0.1 % nasal spray Place 1 spray into both nostrils 2 (two) times daily.   4  . cetirizine (ZYRTEC) 10 MG tablet Take 10 mg by mouth daily.    . clopidogrel (PLAVIX) 75 MG tablet Take 1  tablet (75 mg total) by mouth daily. 90 tablet 3  . dextromethorphan-guaiFENesin (MUCINEX DM) 30-600 MG 12hr tablet Take 1 tablet by mouth 2 (two) times daily.    Marland Kitchen HYDROcodone-homatropine (HYCODAN) 5-1.5 MG/5ML syrup Take 5 mLs by mouth every 6 (six) hours as needed for cough. 240 mL 0  . L-Methylfolate-B12-B6-B2 (CEREFOLIN PO) Take 1 tablet by mouth daily.    Marland Kitchen levofloxacin (LEVAQUIN) 500 MG tablet Take 1 tablet (500 mg total) by mouth daily. 7 tablet 0  . Multiple Vitamins-Minerals (CENTRUM SILVER PO) Take 1 tablet by mouth daily.     . Omega-3 Fatty Acids (FISH OIL) 1000 MG CPDR Take 1,000 mg by mouth every morning.     . pantoprazole (PROTONIX) 40 MG tablet Take 1 tablet (40 mg total) by mouth daily. 30 minutes before dinner 30 tablet 6  . predniSONE (DELTASONE) 10 MG tablet TAKE 1 TABLET EVERY DAY WITH BREAKFAST. 30 tablet 5  . Resveratrol 100 MG CAPS Take 2 capsules by mouth daily.    . rosuvastatin (CRESTOR) 5 MG tablet Take 5 mg by mouth at bedtime.     No current facility-administered medications for this visit.     Allergies  Sudafed [pseudoephedrine hcl] and Atorvastatin  Electrocardiogram:  11/21/13  NSR normal ECG  01/22/15  SR rate 65 LVH no change   Assessment and Plan Carotid: right bruit no change Post bilateral endarterectomies Duplex done 05/06/15 reviewed and plaque no restenosis f/u Dr Kellie Simmering  AAA:  Not palpable f/u duplex [place] 4.2  Cm done 04/2015 Chol:  Lab Results  Component Value Date   LDLCALC 62 04/19/2013   ENT:  Ok to try pseudofed occasionally Suggested mucinex as better drug.  On astelin recently by allergist   Pulmonary: seems to have advanced ILD continue oxygen and steroids f/u [place]  Dementia: with some tardo kinesia consider neuro evaluation and [place]   F/U with me in a year   Jenkins Rouge

## 2016-03-29 ENCOUNTER — Encounter: Payer: Self-pay | Admitting: Cardiovascular Disease

## 2016-03-29 ENCOUNTER — Ambulatory Visit (INDEPENDENT_AMBULATORY_CARE_PROVIDER_SITE_OTHER): Payer: Medicare Other | Admitting: Cardiovascular Disease

## 2016-03-29 VITALS — BP 100/60 | HR 82 | Ht 70.0 in | Wt 187.1 lb

## 2016-03-29 DIAGNOSIS — I6523 Occlusion and stenosis of bilateral carotid arteries: Secondary | ICD-10-CM

## 2016-03-29 NOTE — Patient Instructions (Signed)

## 2016-04-07 ENCOUNTER — Ambulatory Visit: Payer: Medicare Other | Admitting: Pulmonary Disease

## 2016-04-08 NOTE — Progress Notes (Signed)
Subjective:     Patient ID: [name] [name], male   DOB: 08-01-1929, 83 y.o.   MRN: [id number]  HPI  ~  June 24, 2015:  Initial pulmonary consult by SN>  The Britts are friends/ church members with the [employer] family... 83 y/o WM referred by Dr.[name] for further evaluation & treatment of pulmonary fibrosis>  He recently had a vasc surg follow up visit where they noted crackles in the lung bases and referred him back to [name];  He c/o a slight breathing problem w/ SOB/DOE after mowing the yard for 40 min, climbing hills, playing golf but no real change for years he says (stable- not progressive);  He has noted a slight dry cough x1-85yrs and actually saw ENT for eval post nasal drip/ congestion but no better after sinusitis treatment;  Then he saw Allergist- neg testing, no allergies found;  He has occas beige/yellow sput production, denies hx bronchitis/ recurrent infections, and no f/c/s;  He was seen by [facility] for f/u CDopplers (they remain patent s/p bilat CAEs) and they noted crackles at L>R lung base;  Pt rechecked by [name] w/ similar findings and she rechecked CXR & PFT (see below) => refer to pulmonary...   Smoking Hx>  He is an ex-smoker; started around age 5, smoked for ~66yrs but always <1ppd; Quit smoking 1966 w/ a 10 pack-year smoking hx...  Pulmonary Hx>  He denies any hx asthma, does not recall treatment for bronchits, never had pneumonia, no hx TB or known exposure;  He has been treated for sinus infections.  Medical Hx>  HBP, vasc dis w/ carotid dis (s/p bilat CAEs) & AAA, TIA/ stroke, HL, prostate ca (s/p seeds), DJD/ spondylosis, mild dementia  Family Hx>  There is no family hx of lung disease;  Father was a smoker & had a heart attack & stroke.  Occup Hx>  He was the Psychologist, educational for [employer]- no known exposure to asbestos, silica dust, other inorganic or organic dusts/ materials...  Current Meds>  Astelin/ Atrovent nasal, Zyrtek, Plavix,  Resveratrol, Crestor, Fish Oil, Vits... EXAM shows Afeb, VSS, O2sat=92% on RA;  HEENT- neg, mallampati2;  Chest- bibasilar velcro rales L>R ~1/3rd the way up post chest wall;  Heart- RR w/o m/r/g;  Abd- soft, nontender, neg;  Ext- neg w/o c/c/e...  CT Angio Abd done 01/02/08 (pre-AAA stentgraft)>  Chronic interstitial changes noted at the lung bases bilat; fusiform infrarenal AAA at 3.5cm, mod ostial dis of celiac axis origin...   Old CXR 08/15/08 showed norm heart size, patchy opac at the left lung base, otherw neg...  Old CXR 04/18/13 showed norm heart size, sl tortuous Ao, diffuse incr in interstitial markings L>R...  Recent CXR 05/21/15>  Heart at upper lim of normal, prominent coarse interstitial markings bilat L>R, no focal abn & unchanged from 2015 film...  Full PFT 05/29/15> FVC=2.83 (73%), FEV1=2.48 (91%), %1sec=87, mid-flows wnl at 155% predicted; TLC=4.97 (70%), RV=2.08 (76%), RV/TLC=42%; DLCO=23% predicted; c/w a mild restrictive ventilatory defect & severe decr DLCO.   Ambulatory Oximetry 06/24/15>  O2sat=100% on RA at rest w/ pulse=59/min;  He ambulated only 2 Laps in office w/ lowest O2sat=86% w/ pulse=91/min... He indicated that he does NOT want oxygen at this point.  LABS 06/24/15>  Chems- wnl w/ Cr=0.96;  CBC- wnl w/ Hg=13.8;  TSH=4.55;  Sed=34;  CRP=0.8;  RF>600;  CCP=<16;  ANA=neg  CXR 04/18/13                                          CXR 05/21/15     IMP >>     Interstitial Lung Disease-- likely IPF and we will proceed w/ His-res CT Chest, & Preliminary LAB screen...    CARDIAC issues>  46 y/o, HBP, vasc dis w/ bilat carotid dis (s/p bilat CAEs) & AAA (4.1cm), TIA/ stroke, HL; followed by [name] & [name]...    MEDICAL issues>  20 y/o, HBP, vasc dis w/ bilat carotid dis (s/p bilat CAEs) & AAA, TIA/ stroke, HL, prostate ca (s/p seeds), DJD/ spondylosis, mild dementia; followed by [name] & [name]... PLAN >>     We discussed pulmonary fibrosis and the  evaluation- they are familiar w/ this dis since a church friend was recently diagnosed and has since passed away; they are understandably concerned and I pointed out some differences in [place] presentation (ie- he has had this on his CXR w/o much change since 2015);  We will proceed w/ Labs and Hi-res CT Chest => pending.  ADDENDUM>> Hi-res CT CHEST done 07/02/15>  Norm heart size, atherosclerosis of Ao/ coronaries including [place], no adenopathy or lung nodules; extensive subpleural reticulation, traction bronchiectasis, & architectural distortion bilaterally w/ basilar predominance, moderate honeycombing => all favors UIP diagnosis... I called pt & wife w/ report & they will think about options and f/u OV 07/11/15 to discuss further...   ~  July 11, 2015:  2wk ROV w/ SN>  As above I have prev discussed his results to date w/ pt & wife & layed out his options for treating his UIP/ IPF => specifically I outlined a 3-prong approach w/ Aggressive/ Middle-of-the-road/ Conservative options;  They have considered each of these approaches and discussed w/ their children => they have chosen the 2nd option for a middle-of-the-road approach, and in this regard they feel that additional collagen-vasc blood work would not add substantially to his treatment options, and they do not wish to pursue antifibrotic therapy w/ Ofev or Esbriet;  We decided to start pt on a low dose of Pred 10mg /d and he is cautioned to elim salt from their diet, & watch caloric intake;  We plan ROV in 6-8 weeks w/ CXR & Labs at that time...   Ambulatory Oximetry 07/11/15>  O2sat=95% on RA at rest w/ pulse=74/min;  He ambulated 3 Laps (185'ea) w/ lowest O2sat=81% w/ pulse=103/min;  He qualifies for Home Oxygen (rec 2L/min w/ exercise) but he declines...  ONO => pending => this was NOT completed. IMP/PLAN>>  [name] needs to start on HomeO2-POC for oxygen therapy w/ activities but he is reluctant to do so & declines my recommendation to start O2 now;  he notes that he is asymptomatic- plays golf 2d/wk, mows yard, walks, etc and denies SOB, wife is not so sure (he is very stoic and has "mild cognitive impairment"); we decided to check the ONO & then proceed w/ Home O2 as indicated...  ~  August 27, 2015:  6wk ROV w/ SN>  Rush [place]mark returns or f/u of his IPF/ probUIP after starting o Pred10 last visit, and reports a good interval, as noted he is stoic & has mild cognitive impairment; he is currently taking PREDNISONE 10mg /d & tol well, has Home O2 at 2L/min (doesn't like it & compliance is fair); he continues to golf &  do yard work- he does not use the port O2 despite desat w/ exercise- he just takes his time & uses rest stops...    EXAM shows Afeb, VSS, O2sat=92% on RA;  HEENT- neg, mallampati2;  Chest- bibasilar velcro rales L>R ~1/3rd the way up post chest wall;  Heart- RR w/o m/r/g;  Abd- soft, nontender, neg;  Ext- neg w/o c/c/e...  CXR 08/27/15 (independently reviewed by me in the PACS system) showed norm heart size, tortuous Ao, stable bilat fibrotic changes, NAD...  LABS 08/27/15>  Chems- ok x HCO3=33;  CBC- ok x wbc=12.1K... IMP/PLAN>>  Rush Landmark is stable on the [place] well; we wrote for a handicap placard; discussed asking [employer] company for a POC to encourage using oxygen w/ activities... we plan ROV in 1mo.  ~  December 03, 2015:  64mo ROV & [name] notes that his breathing is doing satis, no real change in DOE etc but he has been coughing more, small amt yellow sput, no hemoptysis, no f/c/s, etc;  In the interval he had a UTI- seen in ER w/ fever & some confusion & treated w/ Keflex;  He saw [name] Harriett Sine for f/u- Hx stroke on Plavix, mild cognitive impairment on Cerefolin, felt to be stable;  He also had allergy f/u DrWhelan- on Zyrtek & Astelin...    EXAM is unchanged- Afeb, VSS, O2sat=93% on RA;  HEENT- neg, mallampati2;  Chest- bibasilar velcro rales L>R ~1/3rd the way up;  Heart- RR w/o m/r/g;  Abd- soft, nontender, neg;  Ext- neg w/o  c/c/e... IMP/PLAN>>  Given 2017 Flu vaccine today, rec to add Delsym- 2tsp Bid for cough...  Pulm fibrosis- likely UIP, Exercise induced hypoxemia> continue Pred10, O2 at 2L/min Qhs & w/ exercise, low sodium low carb diet...  Cardiac issues- 83 y/o, HBP, ASPVD w/ AAA repair & bilat-CAEs, TIA/stroke, HL> followed by [name] & [name].  Medical issues- as above + HL, prostate ca (s/p seeds), DJD/spondylosis, MCI/dementia> followed by [name] & [name].    Past Medical History:  Diagnosis Date  . AAA (abdominal aortic aneurysm) ([place])   . Allergy   . Carotid artery stenosis   . Diplopia   . Hyperlipidemia   . Prostate cancer ([place])   . Spondylisthesis 2015   Spine  . Stroke ([place]) 04/18/13   some weakness left-returning strength  . TIA (transient ischemic attack)     Past Surgical History:  Procedure Laterality Date  . CAROTID ENDARTERECTOMY  Sept. 2008   Elective left carotid endarterectomy  . CAROTID ENDARTERECTOMY  fEB. 6, 2015   CE  . ENDARTERECTOMY Right 04/27/2013   Procedure: ENDARTERECTOMY CAROTID;  Surgeon: Mal Misty, MD;  Location: [facility];  Service: Vascular;  Laterality: Right;  . EYE SURGERY     Bilateral cataract   . FOOT SURGERY      left Achilles Tendon Repair  . seed implantation  2007   for prostate ca    Outpatient Encounter Prescriptions as of 12/03/2015  Medication Sig  . azelastine (ASTELIN) 0.1 % nasal spray Place 1 spray into both nostrils 2 (two) times daily.   . cetirizine (ZYRTEC) 10 MG tablet Take 10 mg by mouth daily.  . clopidogrel (PLAVIX) 75 MG tablet Take 1 tablet (75 mg total) by mouth daily.  Marland Kitchen L-Methylfolate-B12-B6-B2 (CEREFOLIN PO) Take 1 tablet by mouth daily.  . Multiple Vitamins-Minerals (CENTRUM SILVER PO) Take 1 tablet by mouth daily.   . Omega-3 Fatty Acids (FISH OIL) 1000 MG CPDR Take 1,000 mg  by mouth every morning.   Marland Kitchen Resveratrol 100 MG CAPS Take 2 capsules by mouth daily.  . rosuvastatin (CRESTOR) 5 MG tablet Take 5 mg by  mouth at bedtime.  . predniSONE (DELTASONE) 10 MG tablet Take 1 tablet (10 mg total) by mouth daily with breakfast.    Allergies  Allergen Reactions  . Sudafed [Pseudoephedrine Hcl]     Causes dizziness and nervousness  . Atorvastatin Other (See Comments)    Reaction: Patient's family reports "confusion" and would prefer crestor     Immunization History  Administered Date(s) Administered  . Influenza, High Dose Seasonal PF 12/03/2015  . Influenza-Unspecified 12/21/2014  . Pneumococcal Conjugate-13 02/28/2014  . Pneumococcal Polysaccharide-23 03/23/1999    Family History  Problem Relation Age of Onset  . Stroke Mother   . Heart disease Mother     After age 47  . Varicose Veins Mother   . Hypertension Mother   . Cancer Mother     Theadora Rama   . Stroke Father   . Heart attack Father   . Deep vein thrombosis Father   . Anuerysm Father   . Heart disease Brother     Current Medications, Allergies, Past Medical History, Past Surgical History, Family History, and Social History were reviewed in [employer] record.   Review of Systems             All symptoms NEG except where BOLDED >>  Constitutional:  F/C/S, fatigue, anorexia, unexpected weight change. HEENT:  HA, visual changes, hearing loss, earache, nasal symptoms, sore throat, mouth sores, hoarseness. Resp:  cough, sputum, hemoptysis; SOB, tightness, wheezing. Cardio:  CP, palpit, DOE, orthopnea, edema. GI:  N/V/D/C, blood in stool; reflux, abd pain, distention, gas. GU:  dysuria, freq, urgency, hematuria, flank pain, voiding difficulty. MS:  joint pain, swelling, tenderness, decr ROM; neck pain, back pain, etc. Neuro:  HA, tremors, seizures, dizziness, syncope, weakness, numbness, gait abn. Skin:  suspicious lesions or skin rash. Heme:  adenopathy, bruising, bleeding. Psyche:  Memory loss, confusion, agitation, sleep disturbance, hallucinations, anxiety, depression, suicidal.   Objective:    Physical Exam       Vital Signs:  Reviewed...   General:  WD, WN, 83 y/o WM in NAD; alert & oriented; pleasant & cooperative... HEENT:  [place]/AT; Conjunctiva- pink, Sclera- nonicteric, EOM-wnl, PERRLA, EACs-clear, TMs-wnl; NOSE-clear; THROAT-clear & wnl.  Neck:  Supple w/ fair ROM; no JVD; normal carotid impulses w/o bruits; no thyromegaly or nodules palpated; no lymphadenopathy.  Chest:  Bibasilar crackles ~1/3rd way up the back;  No wheezing, rhonchi, signs of consolidation... Heart:  Regular Rhythm; norm S1 & S2 without murmurs, rubs, or gallops detected. Abdomen:  Soft & nontender- no guarding or rebound; normal bowel sounds; no organomegaly or masses palpated. Ext:  Normal ROM; without deformities or arthritic changes; no varicose veins, venous insuffic, or edema;  Pulses intact w/o bruits. Neuro:  No focal neuro deficits; sensory testing normal; gait normal & balance OK. Derm:  No lesions noted; no rash etc. Lymph:  No cervical, supraclavicular, axillary, or inguinal adenopathy palpated.   Assessment:      IMP >>     Interstitial Lung Disease-- UIP/ IPF w/ characteristic Hi-res CT Chest findings and prelim collagen-vasc labs showing elev Rheum Factor>600 & Sed=34, otherw neg Anti-CCP/ ANA/ & CRP    CARDIAC issues>  65 y/o, HBP, vasc dis w/ bilat carotid dis (s/p bilat CAEs) & AAA (4.1cm), TIA/ stroke, HL; followed by [name] & [name].Marland KitchenMarland Kitchen  MEDICAL issues>  66 y/o, HBP, vasc dis w/ bilat carotid dis (s/p bilat CAEs) & AAA, TIA/ stroke, HL, prostate ca (s/p seeds), DJD/ spondylosis, mild dementia; followed by [name] & [name]...  PLAN >>  06/24/15>   We discussed pulmonary fibrosis and the evaluation- they are familiar w/ this dis since a church friend was recently diagnosed and has since passed away; they are understandably concerned and I pointed out some differences in [place] presentation (ie- he has had this on his CXR w/o much change since 2015);  We will proceed w/ Labs and Hi-res  CT Chest... 07/11/15>   Pt & family have opted for a middle-of-the-road approach and declined further testing & Antifibrotic therapy; he appears to need O2 rx w/ activities now but is reluctant to accept this (we will check ONO & make final recommendation);  Plus we will give him a trial of Pred 10mg /d... 08/27/15>   [name] is stable on the [place] well; we wrote for a handicap placard; discussed asking [employer] company for a POC to encourage using oxygen w/ activities. 12/03/15>   Stable IPF/UIP on Pred10 + home O2, continue same; given 2017 Flu vaccine today, rec to add Delsym- 2tsp Bid for cough...     Plan:     CONTINUE HOME OXYGEN at 2L/min w/ exercise & Qhs...  Patient's Medications  New Prescriptions  Previous Medications   AZELASTINE (ASTELIN) 0.1 % NASAL SPRAY    Place 1 spray into both nostrils 2 (two) times daily.    CETIRIZINE (ZYRTEC) 10 MG TABLET    Take 10 mg by mouth daily.   CLOPIDOGREL (PLAVIX) 75 MG TABLET    Take 1 tablet (75 mg total) by mouth daily.   DEXTROMETHORPHAN-GUAIFENESIN (MUCINEX DM) 30-600 MG 12HR TABLET    Take 1 tablet by mouth 2 (two) times daily.   L-METHYLFOLATE-B12-B6-B2 (CEREFOLIN PO)    Take 1 tablet by mouth daily.   MULTIPLE VITAMINS-MINERALS (CENTRUM SILVER PO)    Take 1 tablet by mouth daily.    OMEGA-3 FATTY ACIDS (FISH OIL) 1000 MG CPDR    Take 1,000 mg by mouth every morning.    RESVERATROL 100 MG CAPS    Take 2 capsules by mouth daily.   ROSUVASTATIN (CRESTOR) 5 MG TABLET    Take 5 mg by mouth at bedtime.  Modified Medications   Modified Medication Previous Medication   PREDNISONE (DELTASONE) 10 MG TABLET predniSONE (DELTASONE) 10 MG tablet      TAKE 1 TABLET EVERY DAY WITH BREAKFAST.    Take 1 tablet (10 mg total) by mouth daily with breakfast.  Discontinued Medications   No medications on file

## 2016-04-08 NOTE — Progress Notes (Signed)
Subjective:     Patient ID: [name] [name], male   DOB: Dec 12, 1929, 83 y.o.   MRN: [id number]  HPI 83y/o WF w/ mult co-morbidities followed for pulm fibrosis (IPF/UIP) and hypoxemic resp insufficiency- on Prednisone and Home O2...  ~  June 24, 2015:  Initial pulmonary consult by SN>  The Britts are friends/ church members with the [name] [name] family... 83 y/o WM referred by Dr.Elaine Laurann [name] for further evaluation & treatment of pulmonary fibrosis>  He recently had a vasc surg follow up visit where they noted crackles in the lung bases and referred him back to [name] [name];  He c/o a slight breathing problem w/ SOB/DOE after mowing the yard for 40 min, climbing hills, playing golf but no real change for years he says (stable- not progressive);  He has noted a slight dry cough x1-5yrs and actually saw ENT for eval post nasal drip/ congestion but no better after sinusitis treatment;  Then he saw Allergist- neg testing, no allergies found;  He has occas beige/yellow sput production, denies hx bronchitis/ recurrent infections, and no f/c/s;  He was seen by [facility] for f/u CDopplers (they remain patent s/p bilat CAEs) and they noted crackles at L>R lung base;  Pt rechecked by [name] [name] w/ similar findings and she rechecked CXR & PFT (see below) => refer to pulmonary...   Smoking Hx>  He is an ex-smoker; started around age 62, smoked for ~2yrs but always <1ppd; Quit smoking 1966 w/ a 10 pack-year smoking hx...  Pulmonary Hx>  He denies any hx asthma, does not recall treatment for bronchits, never had pneumonia, no hx TB or known exposure;  He has been treated for sinus infections.  Medical Hx>  HBP, vasc dis w/ carotid dis (s/p bilat CAEs) & AAA, TIA/ stroke, HL, prostate ca (s/p seeds), DJD/ spondylosis, mild dementia  Family Hx>  There is no family hx of lung disease;  Father was a smoker & had a heart attack & stroke.  Occup Hx>  He was the Psychologist, educational for [name]- no known exposure to  asbestos, silica dust, other inorganic or organic dusts/ materials...  Current Meds>  Astelin/ Atrovent nasal, Zyrtek, Plavix, Resveratrol, Crestor, Fish Oil, Vits... EXAM shows Afeb, VSS, O2sat=92% on RA;  HEENT- neg, mallampati2;  Chest- bibasilar velcro rales L>R ~1/3rd the way up post chest wall;  Heart- RR w/o m/r/g;  Abd- soft, nontender, neg;  Ext- neg w/o c/c/e...  CT Angio Abd done 01/02/08 (pre-AAA stentgraft)>  Chronic interstitial changes noted at the lung bases bilat; fusiform infrarenal AAA at 3.5cm, mod ostial dis of celiac axis origin...   Old CXR 08/15/08 showed norm heart size, patchy opac at the left lung base, otherw neg...  Old CXR 04/18/13 showed norm heart size, sl tortuous Ao, diffuse incr in interstitial markings L>R...  Recent CXR 05/21/15>  Heart at upper lim of normal, prominent coarse interstitial markings bilat L>R, no focal abn & unchanged from 2015 film...  Full PFT 05/29/15> FVC=2.83 (73%), FEV1=2.48 (91%), %1sec=87, mid-flows wnl at 155% predicted; TLC=4.97 (70%), RV=2.08 (76%), RV/TLC=42%; DLCO=23% predicted; c/w a mild restrictive ventilatory defect & severe decr DLCO.   Ambulatory Oximetry 06/24/15>  O2sat=100% on RA at rest w/ pulse=59/min;  He ambulated only 2 Laps in office w/ lowest O2sat=86% w/ pulse=91/min... He indicated that he does NOT want oxygen at this point.  LABS 06/24/15>  Chems- wnl w/ Cr=0.96;  CBC- wnl w/ Hg=13.8;  TSH=4.55;  Sed=34;  CRP=0.8;  RF>600;  CCP=<16;  ANA=neg                          CXR 04/18/13                                          CXR 05/21/15     IMP >>     Interstitial Lung Disease-- likely IPF and we will proceed w/ His-res CT Chest, & Preliminary LAB screen...    CARDIAC issues>  11 y/o, HBP, vasc dis w/ bilat carotid dis (s/p bilat CAEs) & AAA (4.1cm), TIA/ stroke, HL; followed by [name] [name] & [name] [name]...    MEDICAL issues>  63 y/o, HBP, vasc dis w/ bilat carotid dis (s/p bilat CAEs) & AAA, TIA/ stroke, HL, prostate ca (s/p  seeds), DJD/ spondylosis, mild dementia; followed by [name] [name] & [name] [name]... PLAN >>     We discussed pulmonary fibrosis and the evaluation- they are familiar w/ this dis since a church friend was recently diagnosed and has since passed away; they are understandably concerned and I pointed out some differences in [name] [name] presentation (ie- he has had this on his CXR w/o much change since 2015);  We will proceed w/ Labs and Hi-res CT Chest => pending.  ADDENDUM>> Hi-res CT CHEST done 07/02/15>  Norm heart size, atherosclerosis of Ao/ coronaries including [place], no adenopathy or lung nodules; extensive subpleural reticulation, traction bronchiectasis, & architectural distortion bilaterally w/ basilar predominance, moderate honeycombing => all favors UIP diagnosis... I called pt & wife w/ report & they will think about options and f/u OV 07/11/15 to discuss further...   ~  July 11, 2015:  2wk ROV w/ SN>  As above I have prev discussed his results to date w/ pt & wife & layed out his options for treating his UIP/ IPF => specifically I outlined a 3-prong approach w/ Aggressive/ Middle-of-the-road/ Conservative options;  They have considered each of these approaches and discussed w/ their children => they have chosen the 2nd option for a middle-of-the-road approach, and in this regard they feel that additional collagen-vasc blood work would not add substantially to his treatment options, and they do not wish to pursue antifibrotic therapy w/ Ofev or Esbriet;  We decided to start pt on a low dose of Pred 10mg /d and he is cautioned to elim salt from their diet, & watch caloric intake;  We plan ROV in 6-8 weeks w/ CXR & Labs at that time...   Ambulatory Oximetry 07/11/15>  O2sat=95% on RA at rest w/ pulse=74/min;  He ambulated 3 Laps (185'ea) w/ lowest O2sat=81% w/ pulse=103/min;  He qualifies for Home Oxygen (rec 2L/min w/ exercise) but he declines...  ONO => pending => this was NOT completed. IMP/PLAN>>  [name] [name] needs  to start on HomeO2-POC for oxygen therapy w/ activities but he is reluctant to do so & declines my recommendation to start O2 now; he notes that he is asymptomatic- plays golf 2d/wk, mows yard, walks, etc and denies SOB, wife is not so sure (he is very stoic and has "mild cognitive impairment"); we decided to check the ONO & then proceed w/ Home O2 as indicated...  ~  August 27, 2015:  6wk ROV w/ SN>  [name] [name] returns or f/u of his IPF/ probUIP after starting o Pred10 last visit, and reports a good interval, as noted he is stoic & has mild cognitive impairment;  he is currently taking PREDNISONE 10mg /d & tol well, has Home O2 at 2L/min (doesn't like it & compliance is fair); he continues to golf & do yard work- he does not use the port O2 despite desat w/ exercise- he just takes his time & uses rest stops...    EXAM shows Afeb, VSS, O2sat=92% on RA;  HEENT- neg, mallampati2;  Chest- bibasilar velcro rales L>R ~1/3rd the way up post chest wall;  Heart- RR w/o m/r/g;  Abd- soft, nontender, neg;  Ext- neg w/o c/c/e...  CXR 08/27/15 (independently reviewed by me in the PACS system) showed norm heart size, tortuous Ao, stable bilat fibrotic changes, NAD...  LABS 08/27/15>  Chems- ok x HCO3=33;  CBC- ok x wbc=12.1K... IMP/PLAN>>  [name] [name] is stable on the [place] well; we wrote for a handicap placard; discussed asking [employer] company for a POC to encourage using oxygen w/ activities... we plan ROV in 34mo.  ~  December 03, 2015:  71mo ROV & [name] [name] notes that his breathing is doing satis, no real change in DOE etc but he has been coughing more, small amt yellow sput, no hemoptysis, no f/c/s, etc;  In the interval he had a UTI- seen in ER w/ fever & some confusion & treated w/ Keflex;  He saw [name] [name] Harriett Sine for f/u- Hx stroke on Plavix, mild cognitive impairment on Cerefolin, felt to be stable;  He also had allergy f/u DrWhelan- on Zyrtek & Astelin...    EXAM is unchanged- Afeb, VSS, O2sat=93% on RA;  HEENT- neg,  mallampati2;  Chest- bibasilar velcro rales L>R ~1/3rd the way up;  Heart- RR w/o m/r/g;  Abd- soft, nontender, neg;  Ext- neg w/o c/c/e... IMP/PLAN>>  Given 2017 Flu vaccine today, rec to add Delsym- 2tsp Bid for cough...  Pulm fibrosis- likely UIP, Exercise induced hypoxemia> continue Pred10, O2 at 2L/min Qhs & w/ exercise, low sodium low carb diet...  Cardiac issues- 83 y/o, HBP, ASPVD w/ AAA repair & bilat-CAEs, TIA/stroke, HL> followed by [name] [name] & [name] [name].  Medical issues- as above + HL, prostate ca (s/p seeds), DJD/spondylosis, MCI/dementia> followed by [name] [name] & [name] [name].  ~  January 22, 2016:  6wk ROV & add-on appt requested for worsening cough>  [name] [name] presents c/o 3 wk hx cough- "a deep intense cough", intermittently prod of a small amt of yellow sput, some incr SOB noted due to the coughing paroxysms but he denies CP or f/c/s; of note the cough seems worse at night (but not every night);  He has been taking O2 at 2L/min Qhs & prn w/ exercise during the day, Pred10/d, GFN-400 2Bid w/ fluids, Delsym prn;  The episode sounds like an IPF exacerbation & we discussed Rx w/ Depo20, incr Pred to 20mg /d for 2wks then back to 10mg /d regular dose, LEVAQUIN 500mg x7d, Align daily & Hydromet cough syrup prn;  We also discussed an antireflux regimen for the nocturnal cough- Protonix40 before dinner, NPO after dinner in the eve, elev HOB 6"...    EXAM is unchanged- Afeb, VSS, O2sat=91% on RA;  HEENT- neg, mallampati2;  Chest- bibasilar velcro rales L>R ~1/3rd the way up (of note: pt did not cough once during the OV;  Heart- RR w/o m/r/g;  Abd- soft, nontender, neg;  Ext- neg w/o c/c/e... IMP/PLAN>>  This episode may have been triggered by an upper resp infection, nocturnal reflux and LPR, or an IPF exac-- rec treatment as above w/ O2, Depo/Pred bump, Levaquin, Mucinex, Hydromet, plus we initiated a vigorous antireflux  protocol w/ PPI before dinner, NPO after dinner, elev HOB 6"... He has a regular sched OV in  mid-Jan & we will recheck pt at that time...    Past Medical History:  Diagnosis Date  . AAA (abdominal aortic aneurysm) ([place])   . Allergy   . Carotid artery stenosis   . Diplopia   . Hyperlipidemia   . Prostate cancer ([place])   . Spondylisthesis 2015   Spine  . Stroke ([place]) 04/18/13   some weakness left-returning strength  . TIA (transient ischemic attack)     Past Surgical History:  Procedure Laterality Date  . CAROTID ENDARTERECTOMY  Sept. 2008   Elective left carotid endarterectomy  . CAROTID ENDARTERECTOMY  fEB. 6, 2015   CE  . ENDARTERECTOMY Right 04/27/2013   Procedure: ENDARTERECTOMY CAROTID;  Surgeon: Mal Misty, MD;  Location: [facility];  Service: Vascular;  Laterality: Right;  . EYE SURGERY     Bilateral cataract   . FOOT SURGERY      left Achilles Tendon Repair  . seed implantation  2007   for prostate ca    Outpatient Encounter Prescriptions as of 12/03/2015  Medication Sig  . azelastine (ASTELIN) 0.1 % nasal spray Place 1 spray into both nostrils 2 (two) times daily.   . cetirizine (ZYRTEC) 10 MG tablet Take 10 mg by mouth daily.  . clopidogrel (PLAVIX) 75 MG tablet Take 1 tablet (75 mg total) by mouth daily.  Marland Kitchen L-Methylfolate-B12-B6-B2 (CEREFOLIN PO) Take 1 tablet by mouth daily.  . Multiple Vitamins-Minerals (CENTRUM SILVER PO) Take 1 tablet by mouth daily.   . Omega-3 Fatty Acids (FISH OIL) 1000 MG CPDR Take 1,000 mg by mouth every morning.   Marland Kitchen Resveratrol 100 MG CAPS Take 2 capsules by mouth daily.  . rosuvastatin (CRESTOR) 5 MG tablet Take 5 mg by mouth at bedtime.  . predniSONE (DELTASONE) 10 MG tablet Take 1 tablet (10 mg total) by mouth daily with breakfast.    Allergies  Allergen Reactions  . Sudafed [Pseudoephedrine Hcl]     Causes dizziness and nervousness  . Atorvastatin Other (See Comments)    Reaction: Patient's family reports "confusion" and would prefer crestor     Immunization History  Administered Date(s) Administered  . Influenza,  High Dose Seasonal PF 12/03/2015  . Influenza-Unspecified 12/21/2014  . Pneumococcal Conjugate-13 02/28/2014  . Pneumococcal Polysaccharide-23 03/23/1999    Current Medications, Allergies, Past Medical History, Past Surgical History, Family History, and Social History were reviewed in [employer] record.   Review of Systems             All symptoms NEG except where BOLDED >>  Constitutional:  F/C/S, fatigue, anorexia, unexpected weight change. HEENT:  HA, visual changes, hearing loss, earache, nasal symptoms, sore throat, mouth sores, hoarseness. Resp:  cough, sputum, hemoptysis; SOB, tightness, wheezing. Cardio:  CP, palpit, DOE, orthopnea, edema. GI:  N/V/D/C, blood in stool; reflux, abd pain, distention, gas. GU:  dysuria, freq, urgency, hematuria, flank pain, voiding difficulty. MS:  joint pain, swelling, tenderness, decr ROM; neck pain, back pain, etc. Neuro:  HA, tremors, seizures, dizziness, syncope, weakness, numbness, gait abn. Skin:  suspicious lesions or skin rash. Heme:  adenopathy, bruising, bleeding. Psyche:  Memory loss, confusion, agitation, sleep disturbance, hallucinations, anxiety, depression, suicidal.   Objective:   Physical Exam       Vital Signs:  Reviewed...   General:  WD, WN, 83 y/o WM in NAD; alert & oriented; pleasant & cooperative... HEENT:  [place]/AT; Conjunctiva- pink, Sclera- nonicteric, EOM-wnl, PERRLA, EACs-clear, TMs-wnl; NOSE-clear; THROAT-clear & wnl.  Neck:  Supple w/ fair ROM; no JVD; normal carotid impulses w/o bruits; no thyromegaly or nodules palpated; no lymphadenopathy.  Chest:  Bibasilar crackles ~1/3rd way up the back;  No wheezing, rhonchi, signs of consolidation... Heart:  Regular Rhythm; norm S1 & S2 without murmurs, rubs, or gallops detected. Abdomen:  Soft & nontender- no guarding or rebound; normal bowel sounds; no organomegaly or masses palpated. Ext:  Normal ROM; without deformities or arthritic changes; no  varicose veins, venous insuffic, or edema;  Pulses intact w/o bruits. Neuro:  No focal neuro deficits; sensory testing normal; gait normal & balance OK. Derm:  No lesions noted; no rash etc. Lymph:  No cervical, supraclavicular, axillary, or inguinal adenopathy palpated.   Assessment:      IMP >>     Interstitial Lung Disease-- UIP/ IPF w/ characteristic Hi-res CT Chest findings and prelim collagen-vasc labs showing elev Rheum Factor>600 & Sed=34, otherw neg Anti-CCP/ ANA/ & CRP    CARDIAC issues>  19 y/o, HBP, vasc dis w/ bilat carotid dis (s/p bilat CAEs) & AAA (4.1cm), TIA/ stroke, HL; followed by [name] [name] & [name] [name]...    MEDICAL issues>  29 y/o, HBP, vasc dis w/ bilat carotid dis (s/p bilat CAEs) & AAA, TIA/ stroke, HL, prostate ca (s/p seeds), DJD/ spondylosis, mild dementia; followed by [name] [name] & [name] [name]...  PLAN >>  06/24/15>   We discussed pulmonary fibrosis and the evaluation- they are familiar w/ this dis since a church friend was recently diagnosed and has since passed away; they are understandably concerned and I pointed out some differences in Stuart presentation (ie- he has had this on his CXR w/o much change since 2015);  We will proceed w/ Labs and Hi-res CT Chest... 07/11/15>   Pt & family have opted for a middle-of-the-road approach and declined further testing & Antifibrotic therapy; he appears to need O2 rx w/ activities now but is reluctant to accept this (we will check ONO & make final recommendation);  Plus we will give him a trial of Pred 10mg /d... 08/27/15>   [name] [name] is stable on the [place] well; we wrote for a handicap placard; discussed asking [facility] company for a POC to encourage using oxygen w/ activities. 12/03/15>   Stable IPF/UIP on Pred10 + home O2, continue same; given 2017 Flu vaccine today, rec to add Delsym- 2tsp Bid for cough... 02/10/16>   This episode may have been triggered by an upper resp infection, nocturnal reflux and LPR, or an IPF exac-- rec  treatment as above w/ O2, Depo/Pred bump, Levaquin, Mucinex, Hydromet, plus we initiated a vigorous antireflux protocol w/ PPI before dinner, NPO after dinner, elev HOB 6".     Plan:     CONTINUE HOME OXYGEN at 2L/min w/ exercise & Qhs...  Patient's Medications  [name] Prescriptions   HYDROCODONE-HOMATROPINE (HYCODAN) 5-1.5 MG/5ML SYRUP    Take 5 mLs by mouth every 6 (six) hours as needed for cough.   LEVOFLOXACIN (LEVAQUIN) 500 MG TABLET    Take 1 tablet (500 mg total) by mouth daily.   PANTOPRAZOLE (PROTONIX) 40 MG TABLET    Take 1 tablet (40 mg total) by mouth daily. 30 minutes before dinner  Previous Medications   AZELASTINE (ASTELIN) 0.1 % NASAL SPRAY    Place 1 spray into both nostrils 2 (two) times daily.    CETIRIZINE (ZYRTEC) 10 MG TABLET    Take 10 mg by mouth  daily.   CLOPIDOGREL (PLAVIX) 75 MG TABLET    Take 1 tablet (75 mg total) by mouth daily.   DEXTROMETHORPHAN-GUAIFENESIN (MUCINEX DM) 30-600 MG 12HR TABLET    Take 1 tablet by mouth 2 (two) times daily.   L-METHYLFOLATE-B12-B6-B2 (CEREFOLIN PO)    Take 1 tablet by mouth daily.   MULTIPLE VITAMINS-MINERALS (CENTRUM SILVER PO)    Take 1 tablet by mouth daily.    OMEGA-3 FATTY ACIDS (FISH OIL) 1000 MG CPDR    Take 1,000 mg by mouth every morning.    PREDNISONE (DELTASONE) 10 MG TABLET    TAKE 1 TABLET EVERY DAY WITH BREAKFAST.   RESVERATROL 100 MG CAPS    Take 2 capsules by mouth daily.   ROSUVASTATIN (CRESTOR) 5 MG TABLET    Take 5 mg by mouth at bedtime.  Modified Medications   No medications on file  Discontinued Medications   No medications on file

## 2016-04-18 DIAGNOSIS — J84112 Idiopathic pulmonary fibrosis: Secondary | ICD-10-CM | POA: Diagnosis not present

## 2016-04-18 DIAGNOSIS — I2510 Atherosclerotic heart disease of native coronary artery without angina pectoris: Secondary | ICD-10-CM | POA: Diagnosis not present

## 2016-04-20 ENCOUNTER — Encounter: Payer: Self-pay | Admitting: Pulmonary Disease

## 2016-04-20 ENCOUNTER — Ambulatory Visit (INDEPENDENT_AMBULATORY_CARE_PROVIDER_SITE_OTHER): Payer: Medicare Other | Admitting: Pulmonary Disease

## 2016-04-20 VITALS — BP 112/76 | HR 80

## 2016-04-20 DIAGNOSIS — J84112 Idiopathic pulmonary fibrosis: Secondary | ICD-10-CM | POA: Diagnosis not present

## 2016-04-20 DIAGNOSIS — Z95828 Presence of other vascular implants and grafts: Secondary | ICD-10-CM | POA: Diagnosis not present

## 2016-04-20 DIAGNOSIS — G3184 Mild cognitive impairment, so stated: Secondary | ICD-10-CM

## 2016-04-20 DIAGNOSIS — I714 Abdominal aortic aneurysm, without rupture, unspecified: Secondary | ICD-10-CM

## 2016-04-20 DIAGNOSIS — Z9889 Other specified postprocedural states: Secondary | ICD-10-CM | POA: Diagnosis not present

## 2016-04-20 DIAGNOSIS — I2510 Atherosclerotic heart disease of native coronary artery without angina pectoris: Secondary | ICD-10-CM | POA: Diagnosis not present

## 2016-04-20 DIAGNOSIS — R0902 Hypoxemia: Secondary | ICD-10-CM

## 2016-04-20 DIAGNOSIS — I6523 Occlusion and stenosis of bilateral carotid arteries: Secondary | ICD-10-CM | POA: Diagnosis not present

## 2016-04-20 NOTE — Patient Instructions (Signed)
Today we updated your med list in our EPIC system...    Continue your current medications the same...  Continue your oxygen w/ activity & at night...  Continue the Prednisone 10mg  each AM  OK to use the cough syrup as needed...  Continue the ANTIREFLUX regimen as you are doing...  Call for any questions...  Let's plan a follow up visit in 68mo sooner if needed for problems.Marland KitchenMarland Kitchen

## 2016-04-20 NOTE — Progress Notes (Signed)
Subjective:     Patient ID: [name] [name], male   DOB: [birth year], 83 y.o.   MRN: [id number]  HPI 83y/o WF w/ mult co-morbidities followed for pulm fibrosis (IPF/UIP) and hypoxemic resp insufficiency- on Prednisone and Home O2...  ~  June 24, 2015:  Initial pulmonary consult by [name] [name]>  The Britts are friends/ church members with the [name] [name] family... 83 y/o WM referred by Dr.Elaine Laurann [name] for further evaluation & treatment of pulmonary fibrosis>  He recently had a vasc surg follow up visit where they noted crackles in the lung bases and referred him back to [name] [name];  He c/o a slight breathing problem w/ SOB/DOE after mowing the yard for 40 min, climbing hills, playing golf but no real change for years he says (stable- not progressive);  He has noted a slight dry cough x1-45yrs and actually saw ENT for eval post nasal drip/ congestion but no better after sinusitis treatment;  Then he saw Allergist- neg testing, no allergies found;  He has occas beige/yellow sput production, denies hx bronchitis/ recurrent infections, and no f/c/s;  He was seen by [facility] for f/u CDopplers (they remain patent s/p bilat CAEs) and they noted crackles at L>R lung base;  Pt rechecked by [name] [name] w/ similar findings and she rechecked CXR & PFT (see below) => refer to pulmonary...   Smoking Hx>  He is an ex-smoker; started around age 6, smoked for ~50yrs but always <1ppd; Quit smoking 1966 w/ a 10 pack-year smoking hx...  Pulmonary Hx>  He denies any hx asthma, does not recall treatment for bronchits, never had pneumonia, no hx TB or known exposure;  He has been treated for sinus infections.  Medical Hx>  HBP, vasc dis w/ carotid dis (s/p bilat CAEs) & AAA, TIA/ stroke, HL, prostate ca (s/p seeds), DJD/ spondylosis, mild dementia  Family Hx>  There is no family hx of lung disease;  Father was a smoker & had a heart attack & stroke.  Occup Hx>  He was the Psychologist, educational for [employer]- no known exposure to  asbestos, silica dust, other inorganic or organic dusts/ materials...  Current Meds>  Astelin/ Atrovent nasal, Zyrtek, Plavix, Resveratrol, Crestor, Fish Oil, Vits... EXAM shows Afeb, VSS, O2sat=92% on RA;  HEENT- neg, mallampati2;  Chest- bibasilar velcro rales L>R ~1/3rd the way up post chest wall;  Heart- RR w/o m/r/g;  Abd- soft, nontender, neg;  Ext- neg w/o c/c/e...  CT Angio Abd done 01/02/08>  Chronic interstitial changes noted at the lung bases bilat; fusiform infrarenal AAA at 3.5cm, mod ostial dis of celiac axis origin...   Old CXR 08/15/08 showed norm heart size, patchy opac at the left lung base, otherw neg...  Old CXR 04/18/13 showed norm heart size, sl tortuous Ao, diffuse incr in interstitial markings L>R...  Recent CXR 05/21/15>  Heart at upper lim of normal, prominent coarse interstitial markings bilat L>R, no focal abn & unchanged from 2015 film...  Full PFT 05/29/15> FVC=2.83 (73%), FEV1=2.48 (91%), %1sec=87, mid-flows wnl at 155% predicted; TLC=4.97 (70%), RV=2.08 (76%), RV/TLC=42%; DLCO=23% predicted; c/w a mild restrictive ventilatory defect & severe decr DLCO.   Ambulatory Oximetry 06/24/15>  O2sat=100% on RA at rest w/ pulse=59/min;  He ambulated only 2 Laps in office w/ lowest O2sat=86% w/ pulse=91/min... He indicated that he does NOT want oxygen at this point.  LABS 06/24/15>  Chems- wnl w/ Cr=0.96;  CBC- wnl w/ Hg=13.8;  TSH=4.55;  Sed=34;  CRP=0.8;  RF>600;  CCP=<16;  ANA=neg  CXR 04/18/13                                          CXR 05/21/15     IMP >>     Interstitial Lung Disease-- likely IPF and we will proceed w/ His-res CT Chest, & Preliminary LAB screen...    CARDIAC issues>  53 y/o, HBP, vasc dis w/ bilat carotid dis (s/p bilat CAEs) & AAA (4.1cm), TIA/ stroke, HL; followed by [name] [name] & [name] [name]...    MEDICAL issues>  89 y/o, HBP, vasc dis w/ bilat carotid dis (s/p bilat CAEs) & AAA, TIA/ stroke, HL, prostate ca (s/p seeds), DJD/ spondylosis,  mild dementia; followed by [name] [name] & [name] [name]... PLAN >>     We discussed pulmonary fibrosis and the evaluation- they are familiar w/ this dis since a church friend was recently diagnosed and has since passed away; they are understandably concerned and I pointed out some differences in [name] [name] presentation (ie- he has had this on his CXR w/o much change since 2015);  We will proceed w/ Labs and Hi-res CT Chest => pending.  ADDENDUM>> Hi-res CT CHEST done 07/02/15>  Norm heart size, atherosclerosis of Ao/ coronaries including [place], no adenopathy or lung nodules; extensive subpleural reticulation, traction bronchiectasis, & architectural distortion bilaterally w/ basilar predominance, moderate honeycombing => all favors UIP diagnosis... I called pt & wife w/ report & they will think about options and f/u OV 07/11/15 to discuss further...   ~  July 11, 2015:  2wk ROV w/ [name] [name]>  As above I have prev discussed his results to date w/ pt & wife & layed out his options for treating his UIP/ IPF => specifically I outlined a 3-prong approach w/ Aggressive/ Middle-of-the-road/ Conservative options;  They have considered each of these approaches and discussed w/ their children => they have chosen the 2nd option for a middle-of-the-road approach, and in this regard they feel that additional collagen-vasc blood work would not add substantially to his treatment options, and they do not wish to pursue antifibrotic therapy w/ Ofev or Esbriet;  We decided to start pt on a low dose of Pred 10mg /d and he is cautioned to elim salt from their diet, & watch caloric intake;  We plan ROV in 6-8 weeks w/ CXR & Labs at that time...   Ambulatory Oximetry 07/11/15>  O2sat=95% on RA at rest w/ pulse=74/min;  He ambulated 3 Laps (185'ea) w/ lowest O2sat=81% w/ pulse=103/min;  He qualifies for Home Oxygen (rec 2L/min w/ exercise) but he declines...  ONO => pending => this was NOT completed. IMP/PLAN>>  [name] [name] needs to start on HomeO2-POC for  oxygen therapy w/ activities but he is reluctant to do so & declines my recommendation to start O2 now; he notes that he is asymptomatic- plays golf 2d/wk, mows yard, walks, etc and denies SOB, wife is not so sure (he is very stoic and has "mild cognitive impairment"); we decided to check the ONO & then proceed w/ Home O2 as indicated...  ~  August 27, 2015:  6wk ROV w/ [name] [name]>  Rush Landmark returns or f/u of his IPF/ probUIP after starting o Pred10 last visit, and reports a good interval, as noted he is stoic & has mild cognitive impairment; he is currently taking PREDNISONE 10mg /d & tol well, has Home O2 at 2L/min (doesn't like it & compliance is fair); he continues to golf &  do yard work- he does not use the port O2 despite desat w/ exercise- he just takes his time & uses rest stops...    EXAM shows Afeb, VSS, O2sat=92% on RA;  HEENT- neg, mallampati2;  Chest- bibasilar velcro rales L>R ~1/3rd the way up post chest wall;  Heart- RR w/o m/r/g;  Abd- soft, nontender, neg;  Ext- neg w/o c/c/e...  CXR 08/27/15 (independently reviewed by me in the PACS system) showed norm heart size, tortuous Ao, stable bilat fibrotic changes, NAD...  LABS 08/27/15>  Chems- ok x HCO3=33;  CBC- ok x wbc=12.1K... IMP/PLAN>>  Rush Landmark is stable on the [place] well; we wrote for a handicap placard; discussed asking [employer] company for a POC to encourage using oxygen w/ activities... we plan ROV in 82mo.  ~  December 03, 2015:  35mo ROV & [name] [name] notes that his breathing is doing satis, no real change in DOE etc but he has been coughing more, small amt yellow sput, no hemoptysis, no f/c/s, etc;  In the interval he had a UTI- seen in ER w/ fever & some confusion & treated w/ Keflex;  He saw [name] [name] Harriett Sine for f/u- Hx stroke on Plavix, mild cognitive impairment on Cerefolin, felt to be stable;  He also had allergy f/u DrWhelan- on Zyrtek & Astelin...    EXAM is unchanged- Afeb, VSS, O2sat=93% on RA;  HEENT- neg, mallampati2;  Chest- bibasilar velcro  rales L>R ~1/3rd the way up;  Heart- RR w/o m/r/g;  Abd- soft, nontender, neg;  Ext- neg w/o c/c/e... IMP/PLAN>>  Given 2017 Flu vaccine today, rec to add Delsym- 2tsp Bid for cough...  Pulm fibrosis- likely UIP, Exercise induced hypoxemia> continue Pred10, O2 at 2L/min Qhs & w/ exercise, low sodium low carb diet...  Cardiac issues- 83 y/o, HBP, ASPVD w/ bilat-CAEs (Left 2008, Right 2015), 4cm AAA, TIA/stroke> followed by [name] [name] & [name] [name].  Medical issues- as above + HL, prostate ca (s/p seeds), DJD/spondylosis, MCI/dementia> followed by [name] [name] & [name] [name].  ~  January 22, 2016:  6wk ROV & add-on appt requested for worsening cough>  Rush Landmark presents c/o 3 wk hx cough- "a deep intense cough", intermittently prod of a small amt of yellow sput, some incr SOB noted due to the coughing paroxysms but he denies CP or f/c/s; of note the cough seems worse at night (but not every night);  He has been taking O2 at 2L/min Qhs & prn w/ exercise during the day, Pred10/d, GFN-400 2Bid w/ fluids, Delsym prn;  The episode sounds like an IPF exacerbation & we discussed Rx w/ Depo20, incr Pred to 20mg /d for 2wks then back to 10mg /d regular dose, LEVAQUIN 500mg x7d, Align daily & Hydromet cough syrup prn;  We also discussed an antireflux regimen for the nocturnal cough- Protonix40 before dinner, NPO after dinner in the eve, elev HOB 6"...    EXAM is unchanged- Afeb, VSS, O2sat=91% on RA;  HEENT- neg, mallampati2;  Chest- bibasilar velcro rales L>R ~1/3rd the way up (of note: pt did not cough once during the OV;  Heart- RR w/o m/r/g;  Abd- soft, nontender, neg;  Ext- neg w/o c/c/e... IMP/PLAN>>  This episode may have been triggered by an upper resp infection, nocturnal reflux and LPR, or an IPF exac-- rec treatment as above w/ O2, Depo/Pred bump, Levaquin, Mucinex, Hydromet, plus we initiated a vigorous antireflux protocol w/ PPI before dinner, NPO after dinner, elev HOB 6"... He has a regular sched OV in mid-Jan & we will  recheck  pt at that time...   ~  April 20, 2016:  80mo ROV & pulmonary follow up visit> pt states that he improved after the last visit w/ IPF exac, treated w/ Levaquin & Pred bump, & pt feels that he is back to his baseline; wife is not so sure w/ incr SOB while taking out the trash & simple chores (esp if he tries it w/o his oxygen);  He denies CP, palpit, and feels that his SOB is about the same w/ exertion; mild dry cough esp at night, no sput, no hemoptysis...    Pulm fibrosis- likely UIP, Hypoxemic respiratory failure> on O2 2L/min w/ exercise & Qhs, but encouraged to use it 24/7, and PRED 10mg  Qam; he has Zyrtek & Astelin for sinuses...     Cardiac issues- 83 y/o, HBP, ASPVD w/ bilat-CAEs (Left 2008, Right 2015), 4cm AAA, TIA/stroke> followed by [name] [name] & [name] [name].    Medical issues- as above + HL, prostate ca (s/p seeds), DJD/spondylosis, MCI/dementia> followed by [name] [name] & [name] [name].    EXAM shows Afeb, VSS, O2sat=94% on 3L pulse dose POC;  HEENT- neg, mallampati2;  Chest- bibasilar velcro rales L>R ~1/3rd the way up (of note: pt did not cough once during the OV);  Heart- RR w/o m/r/g;  Abd- soft, nontender, neg;  Ext- neg w/o c/c/e; Neuro- intact x mild memory impairment... IMP/PLAN>>  Rush Landmark is +- stable and says he's doing OK; wife notes incr SOB w/ activities while not using the O2 eg- taking out the trash etc; he has a set routine & he seems to do ok; still drives some & she is asking for guidelines so we discussed not driving if [id number]; he recovered satis from the prev acute exac (using Levaquin & bump Pred) but new baseline is sl lower; Rec to continue the O2 w/ all activities, & the Pred 10mg /d; he will try to gradually incr exercise & expand his lungs; continue vigorous antireflux regimen which we reviewed today... we plan ROV in 27mo, sooner if needed prn.    Past Medical History:  Diagnosis Date  . AAA (abdominal aortic aneurysm) ([place])   . Allergy   . Carotid artery stenosis     . Diplopia   . Hyperlipidemia   . Prostate cancer ([place])   . Spondylisthesis 2015   Spine  . Stroke ([place]) 04/18/13   some weakness left-returning strength  . TIA (transient ischemic attack)     Past Surgical History:  Procedure Laterality Date  . CAROTID ENDARTERECTOMY  Sept. 2008   Elective left carotid endarterectomy  . CAROTID ENDARTERECTOMY  fEB. 6, 2015   CE  . ENDARTERECTOMY Right 04/27/2013   Procedure: ENDARTERECTOMY CAROTID;  Surgeon: Mal Misty, MD;  Location: [facility];  Service: Vascular;  Laterality: Right;  . EYE SURGERY     Bilateral cataract   . FOOT SURGERY      left Achilles Tendon Repair  . seed implantation  2007   for prostate ca    Outpatient Encounter Prescriptions as of 04/20/2016  Medication Sig  . azelastine (ASTELIN) 0.1 % nasal spray Place 1 spray into both nostrils 2 (two) times daily.   . cetirizine (ZYRTEC) 10 MG tablet Take 10 mg by mouth daily.  . clopidogrel (PLAVIX) 75 MG tablet Take 1 tablet (75 mg total) by mouth daily.  Marland Kitchen dextromethorphan-guaiFENesin (MUCINEX DM) 30-600 MG 12hr tablet Take 1 tablet by mouth 2 (two) times daily.  Marland Kitchen L-Methylfolate-B12-B6-B2 (CEREFOLIN PO) Take 1 tablet by mouth daily.  Marland Kitchen  Multiple Vitamins-Minerals (CENTRUM SILVER PO) Take 1 tablet by mouth daily.   . Omega-3 Fatty Acids (FISH OIL) 1000 MG CPDR Take 1,000 mg by mouth every morning.   . pantoprazole (PROTONIX) 40 MG tablet Take 1 tablet (40 mg total) by mouth daily. 30 minutes before dinner  . predniSONE (DELTASONE) 10 MG tablet TAKE 1 TABLET EVERY DAY WITH BREAKFAST.  Marland Kitchen Resveratrol 100 MG CAPS Take 2 capsules by mouth daily.  . rosuvastatin (CRESTOR) 5 MG tablet Take 5 mg by mouth at bedtime.  . [DISCONTINUED] HYDROcodone-homatropine (HYCODAN) 5-1.5 MG/5ML syrup Take 5 mLs by mouth every 6 (six) hours as needed for cough. (Patient not taking: Reported on 04/20/2016)  . [DISCONTINUED] levofloxacin (LEVAQUIN) 500 MG tablet Take 1 tablet (500 mg total) by mouth  daily. (Patient not taking: Reported on 04/20/2016)   No facility-administered encounter medications on file as of 04/20/2016.     Allergies  Allergen Reactions  . Sudafed [Pseudoephedrine Hcl]     Causes dizziness and nervousness  . Atorvastatin Other (See Comments)    Reaction: Patient's family reports "confusion" and would prefer crestor     Immunization History  Administered Date(s) Administered  . Influenza, High Dose Seasonal PF 12/03/2015  . Influenza-Unspecified 12/21/2014  . Pneumococcal Conjugate-13 02/28/2014  . Pneumococcal Polysaccharide-23 03/23/1999    Current Medications, Allergies, Past Medical History, Past Surgical History, Family History, and Social History were reviewed in [employer] record.   Review of Systems             All symptoms NEG except where BOLDED >>  Constitutional:  F/C/S, fatigue, anorexia, unexpected weight change. HEENT:  HA, visual changes, hearing loss, earache, nasal symptoms, sore throat, mouth sores, hoarseness. Resp:  cough, sputum, hemoptysis; SOB, tightness, wheezing. Cardio:  CP, palpit, DOE, orthopnea, edema. GI:  N/V/D/C, blood in stool; reflux, abd pain, distention, gas. GU:  dysuria, freq, urgency, hematuria, flank pain, voiding difficulty. MS:  joint pain, swelling, tenderness, decr ROM; neck pain, back pain, etc. Neuro:  HA, tremors, seizures, dizziness, syncope, weakness, numbness, gait abn. Skin:  suspicious lesions or skin rash. Heme:  adenopathy, bruising, bleeding. Psyche:  Memory loss, confusion, agitation, sleep disturbance, hallucinations, anxiety, depression, suicidal.   Objective:   Physical Exam       Vital Signs:  Reviewed...   General:  WD, WN, 83 y/o WM in NAD; alert & oriented; pleasant & cooperative... HEENT:  [place]/AT; Conjunctiva- pink, Sclera- nonicteric, EOM-wnl, PERRLA, EACs-clear, TMs-wnl; NOSE-clear; THROAT-clear & wnl.  Neck:  Supple w/ fair ROM; no JVD; normal carotid  impulses w/o bruits; no thyromegaly or nodules palpated; no lymphadenopathy.  Chest:  Bibasilar crackles ~1/3rd way up the back;  No wheezing, rhonchi, signs of consolidation... Heart:  Regular Rhythm; norm S1 & S2 without murmurs, rubs, or gallops detected. Abdomen:  Soft & nontender- no guarding or rebound; normal bowel sounds; no organomegaly or masses palpated. Ext:  Normal ROM; without deformities or arthritic changes; no varicose veins, venous insuffic, or edema;  Pulses intact w/o bruits. Neuro:  No focal neuro deficits; sensory testing normal; gait normal & balance OK. Derm:  No lesions noted; no rash etc. Lymph:  No cervical, supraclavicular, axillary, or inguinal adenopathy palpated.   Assessment:      IMP >>     Interstitial Lung Disease-- UIP/ IPF w/ characteristic Hi-res CT Chest findings and prelim collagen-vasc labs showing elev Rheum Factor>600 & Sed=34, otherw neg Anti-CCP/ ANA/ & CRP    CARDIAC issues>  85  y/o, HBP, vasc dis w/ bilat carotid dis (s/p bilat CAEs) & AAA (4.1cm), TIA/ stroke, HL; followed by [name] [name] & [name] [name]...    MEDICAL issues>  43 y/o, HBP, vasc dis w/ bilat carotid dis (s/p bilat CAEs) & AAA, TIA/ stroke, HL, prostate ca (s/p seeds), DJD/ spondylosis, mild dementia; followed by [name] [name] & [name] [name]...  PLAN >>  06/24/15>   We discussed pulmonary fibrosis and the evaluation- they are familiar w/ this dis since a church friend was recently diagnosed and has since passed away; they are understandably concerned and I pointed out some differences in [place] presentation (ie- he has had this on his CXR w/o much change since 2015);  We will proceed w/ Labs and Hi-res CT Chest... 07/11/15>   Pt & family have opted for a middle-of-the-road approach and declined further testing & Antifibrotic therapy; he appears to need O2 rx w/ activities now but is reluctant to accept this (we will check ONO & make final recommendation);  Plus we will give him a trial of Pred  10mg /d... 08/27/15>   [name] [name] is stable on the [place] well; we wrote for a handicap placard; discussed asking [employer] company for a POC to encourage using oxygen w/ activities. 12/03/15>   Stable IPF/UIP on Pred10 + home O2, continue same; given 2017 Flu vaccine today, rec to add Delsym- 2tsp Bid for cough... 02/10/16>   This episode may have been triggered by an upper resp infection, nocturnal reflux and LPR, or an IPF exac-- rec treatment as above w/ O2, Depo/Pred bump, Levaquin, Mucinex, Hydromet, plus we initiated a vigorous antireflux protocol w/ PPI before dinner, NPO after dinner, elev HOB 6".     Plan:     CONTINUE HOME OXYGEN at 2L/min Qhs & up to 3L/min w/ exercise...  Patient's Medications  New Prescriptions   No medications on file  Previous Medications   AZELASTINE (ASTELIN) 0.1 % NASAL SPRAY    Place 1 spray into both nostrils 2 (two) times daily.    CETIRIZINE (ZYRTEC) 10 MG TABLET    Take 10 mg by mouth daily.   CLOPIDOGREL (PLAVIX) 75 MG TABLET    Take 1 tablet (75 mg total) by mouth daily.   DEXTROMETHORPHAN-GUAIFENESIN (MUCINEX DM) 30-600 MG 12HR TABLET    Take 1 tablet by mouth 2 (two) times daily.   L-METHYLFOLATE-B12-B6-B2 (CEREFOLIN PO)    Take 1 tablet by mouth daily.   MULTIPLE VITAMINS-MINERALS (CENTRUM SILVER PO)    Take 1 tablet by mouth daily.    OMEGA-3 FATTY ACIDS (FISH OIL) 1000 MG CPDR    Take 1,000 mg by mouth every morning.    PANTOPRAZOLE (PROTONIX) 40 MG TABLET    Take 1 tablet (40 mg total) by mouth daily. 30 minutes before dinner   PREDNISONE (DELTASONE) 10 MG TABLET    TAKE 1 TABLET EVERY DAY WITH BREAKFAST.   RESVERATROL 100 MG CAPS    Take 2 capsules by mouth daily.   ROSUVASTATIN (CRESTOR) 5 MG TABLET    Take 5 mg by mouth at bedtime.  Modified Medications   No medications on file  Discontinued Medications   HYDROCODONE-HOMATROPINE (HYCODAN) 5-1.5 MG/5ML SYRUP    Take 5 mLs by mouth every 6 (six) hours as needed for cough.   LEVOFLOXACIN  (LEVAQUIN) 500 MG TABLET    Take 1 tablet (500 mg total) by mouth daily.

## 2016-05-03 ENCOUNTER — Encounter: Payer: Self-pay | Admitting: Family

## 2016-05-04 ENCOUNTER — Ambulatory Visit (INDEPENDENT_AMBULATORY_CARE_PROVIDER_SITE_OTHER): Payer: Medicare Other | Admitting: Neurology

## 2016-05-04 ENCOUNTER — Encounter: Payer: Self-pay | Admitting: Neurology

## 2016-05-04 VITALS — BP 130/83 | HR 73 | Wt 189.0 lb

## 2016-05-04 DIAGNOSIS — I63031 Cerebral infarction due to thrombosis of right carotid artery: Secondary | ICD-10-CM | POA: Diagnosis not present

## 2016-05-04 DIAGNOSIS — G3184 Mild cognitive impairment, so stated: Secondary | ICD-10-CM

## 2016-05-04 MED ORDER — ASPIRIN EC 81 MG PO TBEC: 81.0000 mg | DELAYED_RELEASE_TABLET | Freq: Every day | ORAL | Status: AC

## 2016-05-04 NOTE — Progress Notes (Signed)
[facility]  PATIENT: [name] [name] DOB: May 12, 1929   REASON FOR VISIT: Follow-up for mild cognitive impairment history of stroke January 2015 HISTORY FROM: Patient and wife [name] [name]    HISTORY OF PRESENT ILLNESS: HISTORY 24 year Caucasian male seen for first office followup visit from hospital consideration for stroke in January 2015. He presented with difficulty picking things up with his left hand as well as grip weakness while playing golf. He also subsequently diescribed, headache and some speech and language difficulties and double vision. His symptoms improved soon after arrival and hence he was not considered for thrombolysis. CT scan of the head showed an area of low attenuation in the right anterior limb internal capsule suggestive of a subacute lacunar infarct which was subsequently confirmed on MRI . 2D echo showed ejection fraction of 45-50% with diffuse hypokinesis. Carotid Doppler showed progressive 80-99% right ICA stenosis and 1-39% left ICA stenosis at the previous surgical site. Vascular surgery was consulted . Patient was started on aspirin. Patient was scheduled for and underwent elective right carotid endarterectomy by Dr [name] [name] and the procedure went well. Patient was placed on Plavix by me but after the surgery was changed to aspirin. He states his blood pressure is fine and is 103/65 today and it usually runs in the 120s at home. The patient's wife has noted that he has subacute decline in his memory as well as multitasking and cognitive abilities. She is concerned about this and would like evaluation. The patient himself denies this. He has not had an evaluation for these complaints before.   UPDATE 08/27/13 (LL): Since last visit, patient has continued taking Cerefolin NAC for memory. He does not notice any change in his memory, but states he knows his short term memory is not as good as it used to be. He is tolerating Plavix well with no signs of significant  bleeding or bruising. His blood pressure is well controlled, it is 125/79 in the office today. He states he stays active, plays golf a couple days a week. Update 05/10/2014 PShe returns for follow-up accompanied by his wife. He states his memory difficulties are unchanged. He in fact is doing well and is still able to handle finances for the home and business. He is taking fish oil as well as Cerefolin everyday. The patient also remains on Plavix which is tolerating well without bleeding or bruising. He had lipid profile checked in December 2015 by primary physician which was fine. He also had follow-up carotid ultrasound done last week by Dr. Kellie [name] which was okay as well. He exercises regularly and plays golf as well. He has no new complaints. He has not had any recurrent stroke or TIA symptoms. Update 11/05/2014:PS He returns for follow-up after last visit 6 months ago. He is accompanied by his wife. He states his short-term memory difficulties continue. He has occasional fecal times in judgment and multitasking. However overall his symptoms seem stable. He continues to take Cerefolin daily. He does participate in doing sudoku several times a week. He does play golf twice a week. He did have a follow-up carotid ultrasound checked in February and Dr. Evelena [name] office and it was fine. He remains on Plavix which is tolerating well without bleeding or bruising. He has not had any recurrent stroke or TIA symptoms. He remains on Crestor without any myalgias or arthralgias. His blood pressure is elevated today in office at 150/93 but he blames this on white coat hypertension. He did have lipid profile checked  2 months ago by Dr. Laurann [name] and it was fine.  UPDATE 05/06/2015 CMMr. [name] [name], 83 year old male returns for follow-up. He is accompanied by his wife [name] [name]. He has not had further stroke or TIA symptoms since last seen. He is currently on Plavix with minimal bruising. His memory has not progressed since last seen. He  continues to play golf several times a week and participates in card games, and sudoku. He remains on Cerefolin. His lipids are followed by primary care and he remains on Crestor without side effects. Carotid Dopplers followed yearly by Dr. Kellie [name]. He returns for reevaluation UPDATE 08/15/2017CM [name] [name], 83 year old returns for follow-up with his wife. He has a history of stroke in January 2015. He has not had further stroke or TIA symptoms since that time he is currently on Plavix with minimal bruising. His memory is stable, MMSE 27/30. He continues to play golf twice a week and participate in other activities to stimulate his mind. He remains on Cerefolin. He was recently diagnosed with pulmonary fibrosis and is using oxygen at night. He remains on Crestor and lipids are followed by primary care Dr. Laurann [name].  Carotid Doppler is followed by [facility]. Doppler is due to February 2018. He returns for reevaluation Update 05/04/2016 : He returns for follow-up after last visit 6 months ago. He is accompanied by his wife. He states his doing well. He has had no recurrent stroke or TIA symptoms now for 2 years. He is on Plavix but is having significant skin bruising. He in fact is currently having 2 areas that he has Band-Aids because of the bruising. Patient does have regular follow up with vascular surgeon for his aortic aneurysm as well as carotid surgery surveillance. He states her blood pressure is under good control and today it is 130/83. He recently had lipid profile checked on January 2 with LDL cholesterol was 63, HDL 77, triglycerides 116 and total cholesterol 163 mg percent. He has been diagnosed with pulmonary fibrosis and started on home oxygen at night only. He states he feels and in the day. Vistaril memory difficulties about the same. He remains on Center full and NAC and reservoir withdrawal. He is mostly independent in history driving. He has short-term memory difficulties mostly. He has no other new  complaints REVIEW OF SYSTEMS: Full 14 system review of systems performed and notable only for those listed, all others are neg:  Cough, shortness of breath, memory loss, bruising and all other systems negative ALLERGIES: Allergies  Allergen Reactions  . Sudafed [Pseudoephedrine Hcl]     Causes dizziness and nervousness  . Atorvastatin Other (See Comments)    Reaction: Patient's family reports "confusion" and would prefer crestor     HOME MEDICATIONS: Outpatient Medications Prior to Visit  Medication Sig Dispense Refill  . azelastine (ASTELIN) 0.1 % nasal spray Place 1 spray into both nostrils 2 (two) times daily.   4  . cetirizine (ZYRTEC) 10 MG tablet Take 10 mg by mouth daily.    Marland Kitchen dextromethorphan-guaiFENesin (MUCINEX DM) 30-600 MG 12hr tablet Take 1 tablet by mouth 2 (two) times daily.    Marland Kitchen L-Methylfolate-B12-B6-B2 (CEREFOLIN PO) Take 1 tablet by mouth daily.    . Multiple Vitamins-Minerals (CENTRUM SILVER PO) Take 1 tablet by mouth daily.     . Omega-3 Fatty Acids (FISH OIL) 1000 MG CPDR Take 1,000 mg by mouth every morning.     . pantoprazole (PROTONIX) 40 MG tablet Take 1 tablet (40 mg total) by mouth daily. 30 minutes before  dinner 30 tablet 6  . predniSONE (DELTASONE) 10 MG tablet TAKE 1 TABLET EVERY DAY WITH BREAKFAST. 30 tablet 5  . Resveratrol 100 MG CAPS Take 2 capsules by mouth daily.    . rosuvastatin (CRESTOR) 5 MG tablet Take 5 mg by mouth at bedtime.    . clopidogrel (PLAVIX) 75 MG tablet Take 1 tablet (75 mg total) by mouth daily. 90 tablet 3   No facility-administered medications prior to visit.     PAST MEDICAL HISTORY: Past Medical History:  Diagnosis Date  . AAA (abdominal aortic aneurysm) ([place])   . Allergy   . Carotid artery stenosis   . Diplopia   . Hyperlipidemia   . Prostate cancer ([place])   . Spondylisthesis 2015   Spine  . Stroke ([place]) 04/18/13   some weakness left-returning strength  . TIA (transient ischemic attack)     PAST SURGICAL  HISTORY: Past Surgical History:  Procedure Laterality Date  . CAROTID ENDARTERECTOMY  Sept. 2008   Elective left carotid endarterectomy  . CAROTID ENDARTERECTOMY  fEB. 6, 2015   CE  . ENDARTERECTOMY Right 04/27/2013   Procedure: ENDARTERECTOMY CAROTID;  Surgeon: Mal Misty, MD;  Location: [facility];  Service: Vascular;  Laterality: Right;  . EYE SURGERY     Bilateral cataract   . FOOT SURGERY      left Achilles Tendon Repair  . seed implantation  2007   for prostate ca    FAMILY HISTORY: Family History  Problem Relation Age of Onset  . Stroke Mother   . Heart disease Mother     After age 30  . Varicose Veins Mother   . Hypertension Mother   . Cancer Mother     Theadora Rama   . Stroke Father   . Heart attack Father   . Deep vein thrombosis Father   . Anuerysm Father   . Heart disease Brother     SOCIAL HISTORY: Social History   Social History  . Marital status: Married    Spouse name: lee  . Number of children: 2  . Years of education: college   Occupational History  . retired Retired    Programme researcher, broadcasting/film/video    Social History Main Topics  . Smoking status: Former Smoker    Packs/day: 1.00    Years: 14.00    Types: Cigarettes    Quit date: 03/27/1965  . Smokeless tobacco: Never Used     Comment: occ alcohol  . Alcohol use 0.6 oz/week    1 Shots of liquor per week     Comment: 3-4 drinks weekly  . Drug use: No  . Sexual activity: Not Currently   Other Topics Concern  . Not on file   Social History Narrative  . No narrative on file     PHYSICAL EXAM  Vitals:   05/04/16 1044  BP: 130/83  Pulse: 73  Weight: 189 lb (85.7 kg)   Body mass index is 27.12 kg/m. General: well developed, well nourished elderly Caucasian male, seated, in no evident distress  Head: head normocephalic and atraumatic.  Neck: supple without carotid bruit, previous bil CE scars  Cardiovascular: regular rate and rhythm, no murmurs Lungs clear to auscultation   Musculoskeletal: no deformity  Skin: no rash/petichiae  Vascular: Normal pulses all extremities   Neurologic Exam  Mental Status: Awake and fully alert. Oriented to place and time.Diminished immediate recall.Remote memory intact. Attention span, concentration and fund of knowledge appropriate. Mood and affect appropriate. MMSE 29/30 (  last visit 27/30.) AFT 9. Clock drawing 4/4.  Cranial Nerves: Fundoscopic exam not done. Pupils equal, briskly reactive to light. Extraocular movements full without nystagmus. Visual fields full to confrontation. Hearing intact. Facial sensation intact. Face, tongue, palate moves normally and symmetrically.  Motor: Normal bulk and tone. Normal strength in all tested extremity muscles.   Sensory: intact to light touch , pinprick and vibratory in all 4 extremities. Coordination: Rapid alternating movements normal in all extremities. Finger-to-nose and heel-to-shin performed accurately bilaterally.  Gait and Station: Arises from chair without difficulty. Stance is normal. Gait demonstrates normal stride length and balance. Able to heel, toe and tandem walk with mild difficulty. No assistive device Reflexes: 1+ and symmetric.   DIAGNOSTIC DATA (LABS, IMAGING, TESTING) - ASSESSMENT AND PLAN 86 year with right posterior frontal infarct in January 2015 secondary to symptomatic right ICA stenosis for which he has undergone elective right carotid endarterectomy. Multiple vascular risk factors of carotid stenosis, hyperlipidemia, peripheral vascular disease and history of TIA. He also has pre-existing memory loss which is likely mild cognitive impairment which appears stable.  I had a long discussion with the patient and his wife with regards to his mild cognitive impairment which appears to be stable. Continue part is patent in mentally challenging activities and take Cerefolin nac , fish oil and as well as the Reservetrol  . Patient may consider possible  participation in the [place] 2 early dementia trial if interested. Discontinue Plavix because of significant bruising and instead switch to aspirin 81 mg daily for stroke prevention. Maintain strict control of lipids with LDL cholesterol goal below 70 mg percent. Continue follow-up with vascular surgery for carotid surgery follow-up. Greater than 50% time during this 25 minute visit was spent on counseling and coordination of care about his carotid surgery stroke risk memory impairment and discussion on same questions Return for follow-up with me in 6 months or call earlier if necessary. Antony Contras, MD [facility] [address] [place], [place] [address] [phone]

## 2016-05-04 NOTE — Patient Instructions (Signed)
I had a long discussion with the patient and his wife with regards to his mild cognitive impairment which appears to be stable. Continue part is patent in mentally challenging activities and take Cerefolin nac , fish oil and as well as the Reservetrol  . Patient may consider possible participation in the [place] 2 early dementia trial if interested. Discontinue Plavix because of significant bruising and instead switch to aspirin 81 mg daily for stroke prevention. Maintain strict control of lipids with LDL cholesterol goal below 70 mg percent. Continue follow-up with vascular surgery for carotid surgery follow-up. Return for follow-up with me in 6 months or call earlier if necessary.

## 2016-05-11 ENCOUNTER — Ambulatory Visit (INDEPENDENT_AMBULATORY_CARE_PROVIDER_SITE_OTHER)
Admission: RE | Admit: 2016-05-11 | Discharge: 2016-05-11 | Disposition: A | Discharge status: Home / Self Care | Payer: Medicare Other | Source: Ambulatory Visit | Attending: Family | Admitting: Family

## 2016-05-11 ENCOUNTER — Ambulatory Visit (HOSPITAL_COMMUNITY)
Admission: RE | Admit: 2016-05-11 | Discharge: 2016-05-11 | Disposition: A | Discharge status: Home / Self Care | Payer: Medicare Other | Source: Ambulatory Visit | Attending: Family | Admitting: Family

## 2016-05-11 ENCOUNTER — Ambulatory Visit (INDEPENDENT_AMBULATORY_CARE_PROVIDER_SITE_OTHER): Payer: Medicare Other | Admitting: Family

## 2016-05-11 ENCOUNTER — Encounter: Payer: Self-pay | Admitting: Family

## 2016-05-11 VITALS — BP 148/78 | HR 70 | Temp 97.2°F | Resp 24 | Ht 69.0 in | Wt 180.0 lb

## 2016-05-11 DIAGNOSIS — I714 Abdominal aortic aneurysm, without rupture, unspecified: Secondary | ICD-10-CM

## 2016-05-11 DIAGNOSIS — Z9889 Other specified postprocedural states: Secondary | ICD-10-CM | POA: Diagnosis not present

## 2016-05-11 DIAGNOSIS — I7409 Other arterial embolism and thrombosis of abdominal aorta: Secondary | ICD-10-CM | POA: Diagnosis not present

## 2016-05-11 DIAGNOSIS — I6523 Occlusion and stenosis of bilateral carotid arteries: Secondary | ICD-10-CM

## 2016-05-11 DIAGNOSIS — Z48812 Encounter for surgical aftercare following surgery on the circulatory system: Secondary | ICD-10-CM

## 2016-05-11 NOTE — Patient Instructions (Signed)
Abdominal Aortic Aneurysm Blood pumps away from the heart through tubes (blood vessels) called arteries. Aneurysms are weak or damaged places in the wall of an artery. It bulges out like a balloon. An abdominal aortic aneurysm happens in the main artery of the body (aorta). It can burst or tear, causing bleeding inside the body. This is an emergency. It needs treatment right away. What are the causes? The exact cause is unknown. Things that could cause this problem include:  Fat and other substances building up in the lining of a tube.  Swelling of the walls of a blood vessel.  Certain tissue diseases.  Belly (abdominal) trauma.  An infection in the main artery of the body.  What increases the risk? There are things that make it more likely for you to have an aneurysm. These include:  Being over the age of 81 years old.  Having high blood pressure (hypertension).  Being a male.  Being white.  Being very overweight (obese).  Having a family history of aneurysm.  Using tobacco products.  What are the signs or symptoms? Symptoms depend on the size of the aneurysm and how fast it grows. There may not be symptoms. If symptoms occur, they can include:  Pain (belly, side, lower back, or groin).  Feeling full after eating a small amount of food.  Feeling sick to your stomach (nauseous), throwing up (vomiting), or both.  Feeling a lump in your belly that feels like it is beating (pulsating).  Feeling like you will pass out (faint).  How is this treated?  Medicine to control blood pressure and pain.  Imaging tests to see if the aneurysm gets bigger.  Surgery. How is this prevented? To lessen your chance of getting this condition:  Stop smoking. Stop chewing tobacco.  Limit or avoid alcohol.  Keep your blood pressure, blood sugar, and cholesterol within normal limits.  Eat less salt.  Eat foods low in saturated fats and cholesterol. These are found in animal and  whole dairy products.  Eat more fiber. Fiber is found in whole grains, vegetables, and fruits.  Keep a healthy weight.  Stay active and exercise often.  This information is not intended to replace advice given to you by your health care provider. Make sure you discuss any questions you have with your health care provider. Document Released: 07/03/2012 Document Revised: 08/14/2015 Document Reviewed: 04/07/2012 Elsevier Interactive Patient Education  2017 Elsevier Inc.    Stroke Prevention Some medical conditions and behaviors are associated with an increased chance of having a stroke. You may prevent a stroke by making healthy choices and managing medical conditions. How can I reduce my risk of having a stroke?  Stay physically active. Get at least 30 minutes of activity on most or all days.  Do not smoke. It may also be helpful to avoid exposure to secondhand smoke.  Limit alcohol use. Moderate alcohol use is considered to be: ? No more than 2 drinks per day for men. ? No more than 1 drink per day for nonpregnant women.  Eat healthy foods. This involves: ? Eating 5 or more servings of fruits and vegetables a day. ? Making dietary changes that address high blood pressure (hypertension), high cholesterol, diabetes, or obesity.  Manage your cholesterol levels. ? Making food choices that are high in fiber and low in saturated fat, trans fat, and cholesterol may control cholesterol levels. ? Take any prescribed medicines to control cholesterol as directed by your health care provider.  Manage   your diabetes. ? Controlling your carbohydrate and sugar intake is recommended to manage diabetes. ? Take any prescribed medicines to control diabetes as directed by your health care provider.  Control your hypertension. ? Making food choices that are low in salt (sodium), saturated fat, trans fat, and cholesterol is recommended to manage hypertension. ? Ask your health care provider if you  need treatment to lower your blood pressure. Take any prescribed medicines to control hypertension as directed by your health care provider. ? If you are 18-39 years of age, have your blood pressure checked every 3-5 years. If you are 40 years of age or older, have your blood pressure checked every year.  Maintain a healthy weight. ? Reducing calorie intake and making food choices that are low in sodium, saturated fat, trans fat, and cholesterol are recommended to manage weight.  Stop drug abuse.  Avoid taking birth control pills. ? Talk to your health care provider about the risks of taking birth control pills if you are over 35 years old, smoke, get migraines, or have ever had a blood clot.  Get evaluated for sleep disorders (sleep apnea). ? Talk to your health care provider about getting a sleep evaluation if you snore a lot or have excessive sleepiness.  Take medicines only as directed by your health care provider. ? For some people, aspirin or blood thinners (anticoagulants) are helpful in reducing the risk of forming abnormal blood clots that can lead to stroke. If you have the irregular heart rhythm of atrial fibrillation, you should be on a blood thinner unless there is a good reason you cannot take them. ? Understand all your medicine instructions.  Make sure that other conditions (such as anemia or atherosclerosis) are addressed. Get help right away if:  You have sudden weakness or numbness of the face, arm, or leg, especially on one side of the body.  Your face or eyelid droops to one side.  You have sudden confusion.  You have trouble speaking (aphasia) or understanding.  You have sudden trouble seeing in one or both eyes.  You have sudden trouble walking.  You have dizziness.  You have a loss of balance or coordination.  You have a sudden, severe headache with no known cause.  You have new chest pain or an irregular heartbeat. Any of these symptoms may represent a  serious problem that is an emergency. Do not wait to see if the symptoms will go away. Get medical help at once. Call your local emergency services (911 in [place] Do not drive yourself to the hospital. This information is not intended to replace advice given to you by your health care provider. Make sure you discuss any questions you have with your health care provider. Document Released: 04/15/2004 Document Revised: 08/14/2015 Document Reviewed: 09/08/2012 Elsevier Interactive Patient Education  2017 Elsevier Inc.  

## 2016-05-11 NOTE — Progress Notes (Signed)
[facility] [place] HISTORY AND PHYSICAL   MRN : [id number]  History of Present Illness:   [name] [name] is a 83 y.o. male patient of Dr. Kellie Simmering who returns for follow up surveillance of his extracranial carotid arteries and AAA.   AAA August 2015 was 4.1 cm. Pt denies abdominal or back pain.  Pt's wife states pt had a TIA in January 2015, as manifested by left arm numbness and left hand weakness.  In 2008 he had left arm weakness, then the left CEA was done.  He has some residual weakness in his left hand. Pt denies any history of speech difficulties or loss of vision.  Wife states his blood pressure has been up and down, states his blood pressure was 100/60 when he saw Dr. Johnsie Cancel recently.   Pt denies claudication symptoms with walking.  Pt states he saw ortho for swelling in his left ankle, wearing compression hose has decreased the ankle swelling.  The patient reports New Medical or Surgical History: pt's wife states pt was diagnosed with pulmonary fibrosis in 2017, pulmonologist is Dr. Byrd Hesselbach. Is treated with prednisone, 2LO2/[place], and guaifenesin/ dm.  Diagnosed in 2015 with spondolythesis as a result of remote back injury.  Wife states pt sees Dr. Leonie Man for hx of TIA and early signs of dementia.   Pt Diabetic: No Pt smoker: former smoker, quit in 1966  Pt meds include: Statin : Yes ASA: yes Other anticoagulants/antiplatelets: no    Current Outpatient Prescriptions  Medication Sig Dispense Refill  . aspirin EC 81 MG tablet Take 81 mg by mouth daily.    Marland Kitchen azelastine (ASTELIN) 0.1 % nasal spray Place 1 spray into both nostrils 2 (two) times daily.   4  . cetirizine (ZYRTEC) 10 MG tablet Take 10 mg by mouth daily.    Marland Kitchen dextromethorphan-guaiFENesin (MUCINEX DM) 30-600 MG 12hr tablet Take 1 tablet by mouth 2 (two) times daily.    Marland Kitchen L-Methylfolate-B12-B6-B2 (CEREFOLIN PO) Take 1 tablet by mouth daily.    . Multiple Vitamins-Minerals (CENTRUM SILVER  PO) Take 1 tablet by mouth daily.     . Omega-3 Fatty Acids (FISH OIL) 1000 MG CPDR Take 1,000 mg by mouth every morning.     . pantoprazole (PROTONIX) 40 MG tablet Take 1 tablet (40 mg total) by mouth daily. 30 minutes before dinner 30 tablet 6  . predniSONE (DELTASONE) 10 MG tablet TAKE 1 TABLET EVERY DAY WITH BREAKFAST. 30 tablet 5  . Resveratrol 100 MG CAPS Take 2 capsules by mouth daily.    . rosuvastatin (CRESTOR) 5 MG tablet Take 5 mg by mouth at bedtime.     Current Facility-Administered Medications  Medication Dose Route Frequency Provider Last Rate Last Dose  . aspirin EC tablet 81 mg  81 mg Oral Daily Garvin Fila, MD        Past Medical History:  Diagnosis Date  . AAA (abdominal aortic aneurysm) ([place])   . Allergy   . Carotid artery stenosis   . Diplopia   . Hyperlipidemia   . Prostate cancer ([place])   . Spondylisthesis 2015   Spine  . Stroke ([place]) 04/18/13   some weakness left-returning strength  . TIA (transient ischemic attack)     Social History Social History  Substance Use Topics  . Smoking status: Former Smoker    Packs/day: 1.00    Years: 14.00    Types: Cigarettes    Quit date: 03/27/1965  . Smokeless tobacco: Never Used  Comment: occ alcohol  . Alcohol use 0.6 oz/week    1 Shots of liquor per week     Comment: 3-4 drinks weekly    Family History Family History  Problem Relation Age of Onset  . Stroke Mother   . Heart disease Mother     After age 20  . Varicose Veins Mother   . Hypertension Mother   . Cancer Mother     Theadora Rama   . Stroke Father   . Heart attack Father   . Deep vein thrombosis Father   . Anuerysm Father   . Heart disease Brother     Surgical History Past Surgical History:  Procedure Laterality Date  . CAROTID ENDARTERECTOMY  Sept. 2008   Elective left carotid endarterectomy  . CAROTID ENDARTERECTOMY  fEB. 6, 2015   CE  . ENDARTERECTOMY Right 04/27/2013   Procedure: ENDARTERECTOMY CAROTID;  Surgeon: Mal Misty,  MD;  Location: [facility];  Service: Vascular;  Laterality: Right;  . EYE SURGERY     Bilateral cataract   . FOOT SURGERY      left Achilles Tendon Repair  . seed implantation  2007   for prostate ca    Allergies  Allergen Reactions  . Sudafed [Pseudoephedrine Hcl]     Causes dizziness and nervousness  . Atorvastatin Other (See Comments)    Reaction: Patient's family reports "confusion" and would prefer crestor     Current Outpatient Prescriptions  Medication Sig Dispense Refill  . aspirin EC 81 MG tablet Take 81 mg by mouth daily.    Marland Kitchen azelastine (ASTELIN) 0.1 % nasal spray Place 1 spray into both nostrils 2 (two) times daily.   4  . cetirizine (ZYRTEC) 10 MG tablet Take 10 mg by mouth daily.    Marland Kitchen dextromethorphan-guaiFENesin (MUCINEX DM) 30-600 MG 12hr tablet Take 1 tablet by mouth 2 (two) times daily.    Marland Kitchen L-Methylfolate-B12-B6-B2 (CEREFOLIN PO) Take 1 tablet by mouth daily.    . Multiple Vitamins-Minerals (CENTRUM SILVER PO) Take 1 tablet by mouth daily.     . Omega-3 Fatty Acids (FISH OIL) 1000 MG CPDR Take 1,000 mg by mouth every morning.     . pantoprazole (PROTONIX) 40 MG tablet Take 1 tablet (40 mg total) by mouth daily. 30 minutes before dinner 30 tablet 6  . predniSONE (DELTASONE) 10 MG tablet TAKE 1 TABLET EVERY DAY WITH BREAKFAST. 30 tablet 5  . Resveratrol 100 MG CAPS Take 2 capsules by mouth daily.    . rosuvastatin (CRESTOR) 5 MG tablet Take 5 mg by mouth at bedtime.     Current Facility-Administered Medications  Medication Dose Route Frequency Provider Last Rate Last Dose  . aspirin EC tablet 81 mg  81 mg Oral Daily Garvin Fila, MD         REVIEW OF SYSTEMS: See HPI for pertinent positives and negatives.  Physical Examination Vitals:   05/11/16 0911 05/11/16 0915 05/11/16 0917  BP: (!) 150/84 (!) 145/83 (!) 148/78  Pulse: 66 70   Resp: (!) 24    Temp: 97.2 F (36.2 C)    TempSrc: Oral    SpO2: 90%    Weight: 180 lb (81.6 kg)    Height: 5\' 9"  (1.753 m)      Body mass index is 26.58 kg/m.  General:  WDWN male in NAD GAIT: normal Eyes: PERRLA Pulmonary: Respirations are non-labored, + rales all posterior fields, + transient wheezes, no rhonchi.  Cardiac: regular rhythm,no detected murmur.  VASCULAR  EXAM Carotid Bruits Right Left   Negative Negative   Aorta is palpable. Radial pulses are 2+ palpable and equal.      LE Pulses Right Left   FEMORAL  palpable  palpable    POPLITEAL 3+palpable  2+ palpable   POSTERIOR TIBIAL  3+palpable  3+ palpable    DORSALIS PEDIS  ANTERIOR TIBIAL 3+palpable  3+palpable     Gastrointestinal: soft, nontender, BS WNL, no r/g, no palpable masses.  Musculoskeletal: No muscle atrophy/wasting. M/S 4/5 throughout, Extremities without ischemic changes.  Neurologic: A&O X 3; Appropriate Affect ; SENSATION ;normal;  Speech is normal CN 2-12 intact, Pain and light touch intact in extremities, Motor exam as listed above.     ASSESSMENT:  [name] [name] is a 83 y.o. male who is s/p bilateral carotid endarterectomies: September, 2008 (left ), and April 27, 2013 (right). He also has an asymptomatic abdominal aortic aneurysm that has increased to 5.04 cm today compared to 4.22 cm a year ago.  He had a stroke or TIA in 2008 and 2015 with left arm weakness, no strokes or TIA's subsequently. He has some residual weakness in his left hand.    DATA   05-11-16 carotid duplex suggests patent bilateral carotid endarterectomy sites with no hyperplasia or restenosis. Bilateral vertebral artery flow is antegrade.  Right subclavian artery waveforms are biphasic, left are normal.  No significant change from the last exam on 05-06-15.  05-11-16 AAA duplex: AAA: 5.01 x 5.04 cm; Right CIA: 1.9 cm; Left CIA:  1.36 cm 05-06-15: AAA: 4.17 x 4.22 cm  05-14-15  popliteal artery duplex indicates no evidence of aneurysmal dilatation of the bilateral popliteal arteries (prominent bilateral popliteal pulses).   Serum creatinine on 03-23-2016 was 1.03, as requested By Dr. Kelton Pillar.   PLAN:  Based on the patient's vascular studies and examination, pt will be scheduled for CTA abd/pelvis ASAP to further evaluate his 5.04 cm AAA demonstrated on duplex today, follow up with Dr. Donzetta Matters afterward.   Return to clinic in 1 year for carotid duplex.  I discussed in depth with the patient the nature of atherosclerosis, and emphasized the importance of maximal medical management including strict control of blood pressure, blood glucose, and lipid levels, obtaining regular exercise, and cessation of smoking.  The patient is aware that without maximal medical management the underlying atherosclerotic disease process will progress, limiting the benefit of any interventions. Consideration for repair of AAA would be made when the size approaches 4.8 or 5.0 cm, growth > 1 cm/yr, and symptomatic status. The patient was given information about stroke prevention and what symptoms should prompt the patient to seek immediate medical care. The patient was given information about AAA including signs, symptoms, treatment,  what symptoms should prompt the patient to seek immediate medical care, and how to minimize the risk of enlargement and rupture of aneurysms. Thank you for allowing [place] to participate in this patient's care.  Clemon Chambers, RN, MSN, FNP-C [facility] Office: [phone]  Clinic MD: Early 05/11/2016 9:38 AM

## 2016-05-12 ENCOUNTER — Telehealth: Payer: Self-pay | Admitting: Neurology

## 2016-05-12 ENCOUNTER — Other Ambulatory Visit: Payer: Self-pay

## 2016-05-12 MED ORDER — CEREFOLIN 6-1-50-5 MG PO TABS: 1.0000 | ORAL_TABLET | Freq: Every day | ORAL | 4 refills | Status: DC

## 2016-05-12 NOTE — Telephone Encounter (Signed)
Patient requesting refill for L-Methylfolate-B12-B6-B2 (CEREFOLIN PO). [place] [phone] (phone)

## 2016-05-12 NOTE — Telephone Encounter (Signed)
Cerefolin sent via computer for 90 day supply for 3 refills.

## 2016-05-14 ENCOUNTER — Ambulatory Visit
Admission: RE | Admit: 2016-05-14 | Discharge: 2016-05-14 | Disposition: A | Discharge status: Home / Self Care | Payer: Medicare Other | Source: Ambulatory Visit | Attending: Family | Admitting: Family

## 2016-05-14 DIAGNOSIS — I714 Abdominal aortic aneurysm, without rupture, unspecified: Secondary | ICD-10-CM

## 2016-05-14 MED ORDER — IOPAMIDOL (ISOVUE-370) INJECTION 76%
75.0000 mL | Freq: Once | INTRAVENOUS | Status: AC | PRN
  Administered 2016-05-14: 75 mL via INTRAVENOUS

## 2016-05-19 DIAGNOSIS — J84112 Idiopathic pulmonary fibrosis: Secondary | ICD-10-CM | POA: Diagnosis not present

## 2016-05-19 DIAGNOSIS — I2510 Atherosclerotic heart disease of native coronary artery without angina pectoris: Secondary | ICD-10-CM | POA: Diagnosis not present

## 2016-05-21 ENCOUNTER — Ambulatory Visit: Payer: Medicare Other | Admitting: Vascular Surgery

## 2016-05-21 ENCOUNTER — Encounter: Payer: Self-pay | Admitting: Vascular Surgery

## 2016-05-24 ENCOUNTER — Other Ambulatory Visit: Payer: Self-pay | Admitting: Pulmonary Disease

## 2016-05-28 ENCOUNTER — Encounter: Payer: Self-pay | Admitting: Vascular Surgery

## 2016-05-28 ENCOUNTER — Other Ambulatory Visit: Payer: Self-pay

## 2016-05-28 ENCOUNTER — Ambulatory Visit (INDEPENDENT_AMBULATORY_CARE_PROVIDER_SITE_OTHER): Payer: Medicare Other | Admitting: Vascular Surgery

## 2016-05-28 ENCOUNTER — Ambulatory Visit: Payer: Medicare Other | Admitting: Vascular Surgery

## 2016-05-28 VITALS — BP 159/86 | HR 75 | Temp 97.1°F | Resp 18 | Ht 69.0 in | Wt 190.0 lb

## 2016-05-28 DIAGNOSIS — I714 Abdominal aortic aneurysm, without rupture, unspecified: Secondary | ICD-10-CM

## 2016-05-28 NOTE — Progress Notes (Signed)
Patient ID: [name] [name], male   DOB: Jan 15, 1930, 83 y.o.   MRN: [id number]  Reason for Consult: Re-evaluation (2 wk f/u - CTA abd/ pelvis prior)   Referred by Kelton Pillar, MD  Subjective:     HPI:  [name] [name] is a 83 y.o. male with history of bilateral carotid endarterectomies and Donnell aortic aneurysm for which he has been followed for many years. He also has prominent fibrosis for which she is followed by Dr. Lenna Gilford. His followed by Dr. Johnsie Cancel for his coronary disease. He has not had stroke TIA or amaurosis. He does have some memory loss. He is post wear oxygen for his proliferative fibrosis but occasionally and does not wear. He continues to plague off as much as he can. He does not have new back or abdominal pain to suggest symptomatic aneurysm. Recent ultrasound demonstrated growth of his aneurysm for which he underwent CT scan is here for follow-up today.  Past Medical History:  Diagnosis Date  . AAA (abdominal aortic aneurysm) ([place])   . Allergy   . Carotid artery stenosis   . Diplopia   . Hyperlipidemia   . Prostate cancer ([place])   . Spondylisthesis 2015   Spine  . Stroke ([place]) 04/18/13   some weakness left-returning strength  . TIA (transient ischemic attack)    Family History  Problem Relation Age of Onset  . Stroke Mother   . Heart disease Mother     After age 78  . Varicose Veins Mother   . Hypertension Mother   . Cancer Mother     Theadora Rama   . Stroke Father   . Heart attack Father   . Deep vein thrombosis Father   . Anuerysm Father   . Heart disease Brother    Past Surgical History:  Procedure Laterality Date  . CAROTID ENDARTERECTOMY  Sept. 2008   Elective left carotid endarterectomy  . CAROTID ENDARTERECTOMY  fEB. 6, 2015   CE  . ENDARTERECTOMY Right 04/27/2013   Procedure: ENDARTERECTOMY CAROTID;  Surgeon: Mal Misty, MD;  Location: [facility];  Service: Vascular;  Laterality: Right;  . EYE SURGERY     Bilateral cataract   . FOOT SURGERY      left  Achilles Tendon Repair  . seed implantation  2007   for prostate ca    Short Social History:  Social History  Substance Use Topics  . Smoking status: Former Smoker    Packs/day: 1.00    Years: 14.00    Types: Cigarettes    Quit date: 03/27/1965  . Smokeless tobacco: Never Used     Comment: occ alcohol  . Alcohol use 0.6 oz/week    1 Shots of liquor per week     Comment: 3-4 drinks weekly    Allergies  Allergen Reactions  . Sudafed [Pseudoephedrine Hcl]     Causes dizziness and nervousness  . Atorvastatin Other (See Comments)    Reaction: Patient's family reports "confusion" and would prefer crestor     Current Outpatient Prescriptions  Medication Sig Dispense Refill  . aspirin EC 81 MG tablet Take 81 mg by mouth daily.    Marland Kitchen azelastine (ASTELIN) 0.1 % nasal spray Place 1 spray into both nostrils 2 (two) times daily.   4  . cetirizine (ZYRTEC) 10 MG tablet Take 10 mg by mouth daily.    Marland Kitchen dextromethorphan-guaiFENesin (MUCINEX DM) 30-600 MG 12hr tablet Take 1 tablet by mouth 2 (two) times daily.    Marland Kitchen L-Methylfolate-B12-B6-B2 (CEREFOLIN) 08-20-48-5  MG TABS Take 1 tablet by mouth daily. 90 each 4  . Multiple Vitamins-Minerals (CENTRUM SILVER PO) Take 1 tablet by mouth daily.     . Omega-3 Fatty Acids (FISH OIL) 1000 MG CPDR Take 1,000 mg by mouth every morning.     . pantoprazole (PROTONIX) 40 MG tablet Take 1 tablet (40 mg total) by mouth daily. 30 minutes before dinner 30 tablet 6  . predniSONE (DELTASONE) 10 MG tablet TAKE 1 TABLET EVERY DAY WITH BREAKFAST. 30 tablet 2  . Resveratrol 100 MG CAPS Take 2 capsules by mouth daily.    . rosuvastatin (CRESTOR) 5 MG tablet Take 5 mg by mouth at bedtime.     Current Facility-Administered Medications  Medication Dose Route Frequency Provider Last Rate Last Dose  . aspirin EC tablet 81 mg  81 mg Oral Daily Garvin Fila, MD        Review of Systems  Constitutional:  Constitutional negative. Eyes: Eyes negative.  Respiratory:  Positive for cough and shortness of breath.  Cardiovascular: Positive for leg swelling.  GI: Gastrointestinal negative.  Neurological:       Memory loss Hematologic: Hematologic/lymphatic negative.  Psychiatric: Psychiatric negative.        Objective:  Objective   Vitals:   05/28/16 1506 05/28/16 1508  BP: (!) 163/88 (!) 159/86  Pulse: 75   Resp: 18   Temp: 97.1 F (36.2 C)   TempSrc: Oral   SpO2: (!) 88%   Weight: 190 lb (86.2 kg)   Height: 5\' 9"  (1.753 m)    Body mass index is 28.06 kg/m.  Physical Exam  Constitutional: He is oriented to person, place, and time. He appears well-developed.  HENT:  Head: Normocephalic.  Eyes: Pupils are equal, round, and reactive to light.  Cardiovascular: Normal rate.   Pulses:      Carotid pulses are 2+ on the right side, and 2+ on the left side.      Femoral pulses are 2+ on the right side, and 2+ on the left side.      Popliteal pulses are 2+ on the right side, and 2+ on the left side.  Pulmonary/Chest: Effort normal.  Abdominal: He exhibits mass.  Musculoskeletal: Normal range of motion. He exhibits no edema or tenderness.  Neurological: He is alert and oriented to person, place, and time.  Skin: Skin is warm and dry.  Psychiatric: He has a normal mood and affect. His behavior is normal. Judgment and thought content normal.    Data: IMPRESSION: VASCULAR  1. 5.9 cm infrarenal abdominal aortic aneurysm, increased from 4.1 cm in 2009. Vascular surgery consultation recommended due to increased risk of rupture for AAA >5.5 cm. This recommendation follows ACR consensus guidelines: White Paper of the ACR Incidental Findings Committee II on Vascular Findings. J Am Coll Radiol 2013; 10:789-794. 2. Aberrant diminutive inferior right renal artery with decreased enhancement of the supplied lower pole right kidney suggesting a degree of occlusive disease.      Assessment/Plan:     83 year old male here with increase in size  of his abdominal aortic aneurysm to 5.9 cm. We discussed the risk of rupture and the risk of procedure. We discussed options of open repair versus endovascular repair versus no repair at all. The patient demonstrates very good understanding of present his wife at this time they wish to proceed with endovascular repair. We will schedule for a few weeks and get clearance from his pulmonologist and cardiologist whom he has recently seen.  Brandon Christopher Cain MD [facility] [place]  

## 2016-06-01 ENCOUNTER — Telehealth: Payer: Self-pay

## 2016-06-01 NOTE — Telephone Encounter (Signed)
Ok from cardiac perspective to proceed with endovascular AAA repair

## 2016-06-01 NOTE — Telephone Encounter (Signed)
Request for Cardiac surgical clearance:  1. What type of surgery is being performed? Endovascular Aneurysm Repair for AAA  2. When is this surgery scheduled? ASAP   3. Are there any medications that need to be held prior to surgery and how long?  4. Name of physician performing surgery? Dr. Servando Snare   5. What is your office phone and fax number? Fax [phone] and Office Phone [phone]

## 2016-06-02 ENCOUNTER — Other Ambulatory Visit: Payer: Self-pay | Admitting: Vascular Surgery

## 2016-06-02 NOTE — Telephone Encounter (Signed)
Faxed response to [facility].

## 2016-06-11 ENCOUNTER — Encounter (HOSPITAL_COMMUNITY): Payer: Self-pay

## 2016-06-11 ENCOUNTER — Encounter (HOSPITAL_COMMUNITY)
Admission: RE | Admit: 2016-06-11 | Discharge: 2016-06-11 | Disposition: A | Discharge status: Home / Self Care | Payer: Medicare Other | Source: Ambulatory Visit | Attending: Vascular Surgery | Admitting: Vascular Surgery

## 2016-06-11 DIAGNOSIS — Z01818 Encounter for other preprocedural examination: Secondary | ICD-10-CM | POA: Insufficient documentation

## 2016-06-11 HISTORY — DX: Gastro-esophageal reflux disease without esophagitis: K21.9

## 2016-06-11 HISTORY — DX: Pulmonary fibrosis, unspecified: J84.10

## 2016-06-11 LAB — PROTIME-INR
INR: 1.12
PROTHROMBIN TIME: 14.4 s (ref 11.4–15.2)

## 2016-06-11 LAB — CBC
HCT: 38.8 % — ABNORMAL LOW (ref 39.0–52.0)
Hemoglobin: 12.8 g/dL — ABNORMAL LOW (ref 13.0–17.0)
MCH: 31.4 pg (ref 26.0–34.0)
MCHC: 33 g/dL (ref 30.0–36.0)
MCV: 95.3 fL (ref 78.0–100.0)
PLATELETS: 139 10*3/uL — AB (ref 150–400)
RBC: 4.07 MIL/uL — ABNORMAL LOW (ref 4.22–5.81)
RDW: 13.9 % (ref 11.5–15.5)
WBC: 9.8 10*3/uL (ref 4.0–10.5)

## 2016-06-11 LAB — COMPREHENSIVE METABOLIC PANEL
ALBUMIN: 3.8 g/dL (ref 3.5–5.0)
ALT: 29 U/L (ref 17–63)
ANION GAP: 11 (ref 5–15)
AST: 34 U/L (ref 15–41)
Alkaline Phosphatase: 49 U/L (ref 38–126)
BUN: 14 mg/dL (ref 6–20)
CHLORIDE: 107 mmol/L (ref 101–111)
CO2: 22 mmol/L (ref 22–32)
Calcium: 9.1 mg/dL (ref 8.9–10.3)
Creatinine, Ser: 0.91 mg/dL (ref 0.61–1.24)
GFR calc Af Amer: >60 mL/min (ref >60)
GFR calc non Af Amer: >60 mL/min (ref >60)
GLUCOSE: 114 mg/dL — AB (ref 65–99)
POTASSIUM: 4.1 mmol/L (ref 3.5–5.1)
Sodium: 140 mmol/L (ref 135–145)
Total Bilirubin: 0.6 mg/dL (ref 0.3–1.2)
Total Protein: 7 g/dL (ref 6.5–8.1)

## 2016-06-11 LAB — SURGICAL PCR SCREEN
MRSA, PCR: NEGATIVE
Staphylococcus aureus: NEGATIVE

## 2016-06-11 LAB — BLOOD GAS, ARTERIAL
ACID-BASE EXCESS: 1.5 mmol/L (ref 0.0–2.0)
Bicarbonate: 25.3 mmol/L (ref 20.0–28.0)
Drawn by: 421801
O2 Content: 2 L/min
O2 Saturation: 91.8 %
PATIENT TEMPERATURE: 98.6
pCO2 arterial: 37.8 mmHg (ref 32.0–48.0)
pH, Arterial: 7.44 (ref 7.350–7.450)
pO2, Arterial: 62.9 mmHg — ABNORMAL LOW (ref 83.0–108.0)

## 2016-06-11 LAB — TYPE AND SCREEN
ABO/RH(D): O POS
ANTIBODY SCREEN: NEGATIVE

## 2016-06-11 LAB — URINALYSIS, ROUTINE W REFLEX MICROSCOPIC
BILIRUBIN URINE: NEGATIVE
Glucose, UA: NEGATIVE mg/dL
HGB URINE DIPSTICK: NEGATIVE
KETONES UR: NEGATIVE mg/dL
Leukocytes, UA: NEGATIVE
NITRITE: NEGATIVE
Protein, ur: NEGATIVE mg/dL
SPECIFIC GRAVITY, URINE: 1.025 (ref 1.005–1.030)
pH: 5 (ref 5.0–8.0)

## 2016-06-11 LAB — APTT: aPTT: 29 seconds (ref 24–36)

## 2016-06-11 NOTE — Pre-Procedure Instructions (Addendum)
Tristram Milian  06/11/2016      [place], [place] [address] Phone: [phone] Fax: [place] - [address] [address] [address] [facility] [address] [place] [address] Phone: [phone] Fax: [phone]  [place], [address] [place] [address] Phone: [phone] Fax: [phone]    Your procedure is scheduled on *06/17/16  Report to [facility] at  530 A.M.  Call this number if you have problems the morning of surgery:  [phone]   Remember:  Do not eat food or drink liquids after midnight.  Take these medicines the morning of surgery with A SIP OF WATER        Pantoprazole(protonix), prednisone  STOP all herbel meds, nsaids (aleve,naproxen,advil,ibuprofen) Starting NOW including all vitamins/supplements (cerefolin,multi)  STOP Aspirin per dr   Lazaro Arms not wear jewelry, make-up or nail polish.  Do not wear lotions, powders, or perfumes, or deoderant.  Do not shave 48 hours prior to surgery.  Men may shave face and neck.  Do not bring valuables to the hospital.  [facility] is not responsible for any belongings or valuables.  Contacts, dentures or bridgework may not be worn into surgery.  Leave your suitcase in the car.  After surgery it may be brought to your room.  For patients admitted to the hospital, discharge time will be determined by your treatment team.  Patients discharged the day of surgery will not be allowed to drive home.   Special instructions:   Special Instructions: [address] - Preparing for Surgery  Before surgery, you can play an important role.  Because skin is not sterile, your skin needs to be as free of germs as possible.  You can reduce the number of germs on you skin by washing with CHG (chlorahexidine gluconate) soap before surgery.  CHG  is an antiseptic cleaner which kills germs and bonds with the skin to continue killing germs even after washing.  Please DO NOT use if you have an allergy to CHG or antibacterial soaps.  If your skin becomes reddened/irritated stop using the CHG and inform your nurse when you arrive at Short Stay.  Do not shave (including legs and underarms) for at least 48 hours prior to the first CHG shower.  You may shave your face.  Please follow these instructions carefully:   1.  Shower with CHG Soap the night before surgery and the morning of Surgery.  2.  If you choose to wash your hair, wash your hair first as usual with your normal shampoo.  3.  After you shampoo, rinse your hair and body thoroughly to remove the Shampoo.  4.  Use CHG as you would any other liquid soap.  You can apply chg directly  to the skin and wash gently with scrungie or a clean washcloth.  5.  Apply the CHG Soap to your body ONLY FROM THE NECK DOWN.  Do not use on open wounds or open sores.  Avoid contact with your eyes ears, mouth and genitals (private parts).  Wash genitals (private parts)       with your normal soap.  6.  Wash thoroughly, paying special attention to the area where your surgery will be performed.  7.  Thoroughly rinse your body with warm water from the neck down.  8.  DO NOT  shower/wash with your normal soap after using and rinsing off the CHG Soap.  9.  Pat yourself dry with a clean towel.            10.  Wear clean pajamas.            11.  Place clean sheets on your bed the night of your first shower and do not sleep with pets.  Day of Surgery  Do not apply any lotions/deodorants the morning of surgery.  Please wear clean clothes to the hospital/surgery center.  Please read over the  fact sheets that you were given.

## 2016-06-14 NOTE — Progress Notes (Signed)
Anesthesia Chart Review:  Pt is an 83 year old male scheduled for abdominal aortic endovascular stent graft on 06/17/2016 with Servando Snare, M.D.  - PCP is Kelton Pillar, MD - Pulmonologist is Teressa Lower, MD who cleared pt for surgery. Last office visit 04/20/16 - Neurologist is Antony Contras, MD, last office visit 05/04/16 - Cardiologist is Jenkins Rouge, MD, who cleared pt for surgery. Last office visit 03/29/16.   PMH includes: AAA, stroke, TIA, interstitial lung disease, mild dementia, hyperlipidemia, pulmonary fibrosis, GERD. Uses oxygen with activity. Former smoker. BMI 27. S/p R CEA 04/27/13; s/p L CEA.   Medications include: ASA 81 mg, Protonix, prednisone, rosuvastatin.  Preoperative labs reviewed.    CXR 10/15/15: Stable changes of interstitial fibrosis bilaterally, slightly more severe on the left than on the right. No new opacity. No change in cardiac silhouette.  EKG 10/15/15: Sinus rhythm. Anteroseptal infarct, age indeterminate. Baseline wander in lead(s) V6  Carotid duplex 05/11/16: Patent B CEA sites without evidence for restenosis.  Nuclear stress test 06/06/13: Normal stress nuclear study. LV Ejection Fraction: 56%.  LV Wall Motion:  Normal Wall Motion  Echo 04/18/13:  - Left ventricle: The cavity size was normal. Wall thicknesswas normal. Systolic function was mildly reduced. Theestimated ejection fraction was in the range of 45% to50%. Diffuse hypokinesis. Although no diagnostic regionalwall motion abnormality was identified, this possibilitycannot be completely excluded on the basis of this study.Features are consistent with a pseudonormal leftventricular filling pattern, with concomitant abnormalrelaxation and increased filling pressure (grade 2diastolic dysfunction). - Aortic valve: Trivial regurgitation. - Mitral valve: Trivial, late systolicprolapse, involvingthe posterior leaflet. - Pulmonary arteries: PA peak pressure: 9mm Hg (S).  If no changes, I anticipate  pt can proceed with surgery as scheduled.   Willeen Cass, FNP-BC [facility] Stay Surgical [facility]/Anesthesiology Phone: [phone] 06/14/2016 3:28 PM

## 2016-06-16 DIAGNOSIS — I2510 Atherosclerotic heart disease of native coronary artery without angina pectoris: Secondary | ICD-10-CM | POA: Diagnosis not present

## 2016-06-16 DIAGNOSIS — J84112 Idiopathic pulmonary fibrosis: Secondary | ICD-10-CM | POA: Diagnosis not present

## 2016-06-16 NOTE — Anesthesia Preprocedure Evaluation (Addendum)
Anesthesia Evaluation  Patient identified by MRN, date of birth, ID band Patient awake    Reviewed: Allergy & Precautions, H&P , NPO status , Patient's Chart, lab work & pertinent test results  Airway Mallampati: III  TM Distance: >3 FB Neck ROM: Full    Dental no notable dental hx. (+) Teeth Intact, Dental Advisory Given   Pulmonary neg pulmonary ROS, former smoker,    Pulmonary exam normal breath sounds clear to auscultation       Cardiovascular Exercise Tolerance: Good + Peripheral Vascular Disease  negative cardio ROS   Rhythm:Regular Rate:Normal     Neuro/Psych TIACVA, Residual Symptoms negative psych ROS   GI/Hepatic Neg liver ROS, GERD  Medicated and Controlled,  Endo/Other  negative endocrine ROS  Renal/GU negative Renal ROS  negative genitourinary   Musculoskeletal   Abdominal   Peds  Hematology negative hematology ROS (+)   Anesthesia Other Findings   Reproductive/Obstetrics negative OB ROS                            Anesthesia Physical Anesthesia Plan  ASA: III  Anesthesia Plan: General   Post-op Pain Management:    Induction: Intravenous  Airway Management Planned: Oral ETT  Additional Equipment: Arterial line  Intra-op Plan:   Post-operative Plan: Extubation in OR  Informed Consent: I have reviewed the patients History and Physical, chart, labs and discussed the procedure including the risks, benefits and alternatives for the proposed anesthesia with the patient or authorized representative who has indicated his/her understanding and acceptance.   Dental advisory given  Plan Discussed with: CRNA  Anesthesia Plan Comments:       Anesthesia Quick Evaluation

## 2016-06-17 ENCOUNTER — Inpatient Hospital Stay (HOSPITAL_COMMUNITY): Payer: Medicare Other | Admitting: Emergency Medicine

## 2016-06-17 ENCOUNTER — Inpatient Hospital Stay (HOSPITAL_COMMUNITY): Payer: Medicare Other | Admitting: Anesthesiology

## 2016-06-17 ENCOUNTER — Encounter (HOSPITAL_COMMUNITY)
Admission: RE | Disposition: A | Discharge status: Home / Self Care | Payer: Self-pay | Source: Ambulatory Visit | Attending: Vascular Surgery

## 2016-06-17 ENCOUNTER — Encounter (HOSPITAL_COMMUNITY): Payer: Self-pay | Admitting: Urology

## 2016-06-17 ENCOUNTER — Inpatient Hospital Stay (HOSPITAL_COMMUNITY)
Admission: RE | Admit: 2016-06-17 | Discharge: 2016-06-19 | DRG: 269 | Disposition: A | Discharge status: Home / Self Care | Payer: Medicare Other | Source: Ambulatory Visit | Attending: Vascular Surgery | Admitting: Vascular Surgery

## 2016-06-17 ENCOUNTER — Inpatient Hospital Stay (HOSPITAL_COMMUNITY): Payer: Medicare Other

## 2016-06-17 DIAGNOSIS — Z9981 Dependence on supplemental oxygen: Secondary | ICD-10-CM

## 2016-06-17 DIAGNOSIS — Z87891 Personal history of nicotine dependence: Secondary | ICD-10-CM | POA: Diagnosis not present

## 2016-06-17 DIAGNOSIS — K219 Gastro-esophageal reflux disease without esophagitis: Secondary | ICD-10-CM | POA: Diagnosis present

## 2016-06-17 DIAGNOSIS — I714 Abdominal aortic aneurysm, without rupture, unspecified: Secondary | ICD-10-CM | POA: Diagnosis present

## 2016-06-17 DIAGNOSIS — R918 Other nonspecific abnormal finding of lung field: Secondary | ICD-10-CM | POA: Diagnosis not present

## 2016-06-17 DIAGNOSIS — Z8673 Personal history of transient ischemic attack (TIA), and cerebral infarction without residual deficits: Secondary | ICD-10-CM | POA: Diagnosis not present

## 2016-06-17 DIAGNOSIS — J8410 Pulmonary fibrosis, unspecified: Secondary | ICD-10-CM | POA: Diagnosis present

## 2016-06-17 DIAGNOSIS — E785 Hyperlipidemia, unspecified: Secondary | ICD-10-CM | POA: Diagnosis present

## 2016-06-17 DIAGNOSIS — I713 Abdominal aortic aneurysm, ruptured: Secondary | ICD-10-CM | POA: Diagnosis not present

## 2016-06-17 DIAGNOSIS — Z8546 Personal history of malignant neoplasm of prostate: Secondary | ICD-10-CM

## 2016-06-17 DIAGNOSIS — I2510 Atherosclerotic heart disease of native coronary artery without angina pectoris: Secondary | ICD-10-CM | POA: Diagnosis not present

## 2016-06-17 DIAGNOSIS — R0989 Other specified symptoms and signs involving the circulatory and respiratory systems: Secondary | ICD-10-CM

## 2016-06-17 DIAGNOSIS — J84112 Idiopathic pulmonary fibrosis: Secondary | ICD-10-CM | POA: Diagnosis not present

## 2016-06-17 DIAGNOSIS — Z8679 Personal history of other diseases of the circulatory system: Secondary | ICD-10-CM

## 2016-06-17 DIAGNOSIS — Z9889 Other specified postprocedural states: Secondary | ICD-10-CM

## 2016-06-17 HISTORY — PX: ABDOMINAL AORTIC ENDOVASCULAR STENT GRAFT: SHX5707

## 2016-06-17 LAB — CBC
HEMATOCRIT: 37.5 % — AB (ref 39.0–52.0)
HEMOGLOBIN: 12.3 g/dL — AB (ref 13.0–17.0)
MCH: 31.4 pg (ref 26.0–34.0)
MCHC: 32.8 g/dL (ref 30.0–36.0)
MCV: 95.7 fL (ref 78.0–100.0)
Platelets: 129 10*3/uL — ABNORMAL LOW (ref 150–400)
RBC: 3.92 MIL/uL — ABNORMAL LOW (ref 4.22–5.81)
RDW: 13.8 % (ref 11.5–15.5)
WBC: 11.3 10*3/uL — AB (ref 4.0–10.5)

## 2016-06-17 LAB — BASIC METABOLIC PANEL
ANION GAP: 8 (ref 5–15)
BUN: 9 mg/dL (ref 6–20)
CHLORIDE: 106 mmol/L (ref 101–111)
CO2: 25 mmol/L (ref 22–32)
Calcium: 8.4 mg/dL — ABNORMAL LOW (ref 8.9–10.3)
Creatinine, Ser: 0.89 mg/dL (ref 0.61–1.24)
GFR calc non Af Amer: >60 mL/min (ref >60)
Glucose, Bld: 123 mg/dL — ABNORMAL HIGH (ref 65–99)
Potassium: 3.4 mmol/L — ABNORMAL LOW (ref 3.5–5.1)
Sodium: 139 mmol/L (ref 135–145)

## 2016-06-17 LAB — PROTIME-INR
INR: 1.26
Prothrombin Time: 15.8 seconds — ABNORMAL HIGH (ref 11.4–15.2)

## 2016-06-17 LAB — MAGNESIUM: MAGNESIUM: 1.5 mg/dL — AB (ref 1.7–2.4)

## 2016-06-17 LAB — APTT: APTT: 48 s — AB (ref 24–36)

## 2016-06-17 SURGERY — INSERTION, ENDOVASCULAR STENT GRAFT, AORTA, ABDOMINAL
Anesthesia: General | Site: Groin

## 2016-06-17 MED ORDER — CHLORHEXIDINE GLUCONATE CLOTH 2 % EX PADS: 6.0000 | MEDICATED_PAD | Freq: Once | CUTANEOUS | Status: DC

## 2016-06-17 MED ORDER — IODIXANOL 320 MG/ML IV SOLN
INTRAVENOUS | Status: DC | PRN
  Administered 2016-06-17: 150 mL via INTRAVENOUS

## 2016-06-17 MED ORDER — SODIUM CHLORIDE 0.9 % IV SOLN
INTRAVENOUS | Status: DC
Start: 2016-06-17 — End: 2016-06-19
  Administered 2016-06-17: 11:00:00 via INTRAVENOUS

## 2016-06-17 MED ORDER — LACTATED RINGERS IV SOLN
INTRAVENOUS | Status: DC | PRN
  Administered 2016-06-17 (×2): via INTRAVENOUS

## 2016-06-17 MED ORDER — ROSUVASTATIN CALCIUM 10 MG PO TABS
5.0000 mg | ORAL_TABLET | Freq: Every day | ORAL | Status: DC
  Administered 2016-06-17 – 2016-06-18 (×2): 5 mg via ORAL
  Filled 2016-06-17 (×2): qty 1

## 2016-06-17 MED ORDER — OXYCODONE-ACETAMINOPHEN 5-325 MG PO TABS: 1.0000 | ORAL_TABLET | ORAL | Status: DC | PRN

## 2016-06-17 MED ORDER — ROCURONIUM BROMIDE 50 MG/5ML IV SOSY
PREFILLED_SYRINGE | INTRAVENOUS | Status: AC
  Filled 2016-06-17: qty 5

## 2016-06-17 MED ORDER — ALUM & MAG HYDROXIDE-SIMETH 200-200-20 MG/5ML PO SUSP: 15.0000 mL | ORAL | Status: DC | PRN

## 2016-06-17 MED ORDER — LACTATED RINGERS IV SOLN
INTRAVENOUS | Status: DC | PRN
  Administered 2016-06-17: 07:00:00 via INTRAVENOUS

## 2016-06-17 MED ORDER — CEFUROXIME SODIUM 1.5 G IJ SOLR
1.5000 g | INTRAMUSCULAR | Status: AC
  Administered 2016-06-17: 1.5 g via INTRAVENOUS

## 2016-06-17 MED ORDER — FENTANYL CITRATE (PF) 250 MCG/5ML IJ SOLN
INTRAMUSCULAR | Status: AC
  Filled 2016-06-17: qty 5

## 2016-06-17 MED ORDER — PHENYLEPHRINE 40 MCG/ML (10ML) SYRINGE FOR IV PUSH (FOR BLOOD PRESSURE SUPPORT)
PREFILLED_SYRINGE | INTRAVENOUS | Status: AC
  Filled 2016-06-17: qty 10

## 2016-06-17 MED ORDER — PREDNISONE 10 MG PO TABS
10.0000 mg | ORAL_TABLET | Freq: Every day | ORAL | Status: DC
  Administered 2016-06-18 – 2016-06-19 (×2): 10 mg via ORAL
  Filled 2016-06-17 (×2): qty 1

## 2016-06-17 MED ORDER — HYDRALAZINE HCL 20 MG/ML IJ SOLN
INTRAMUSCULAR | Status: AC
  Filled 2016-06-17: qty 1

## 2016-06-17 MED ORDER — ACETAMINOPHEN 500 MG PO TABS: 500.0000 mg | ORAL_TABLET | Freq: Four times a day (QID) | ORAL | Status: DC | PRN

## 2016-06-17 MED ORDER — DOCUSATE SODIUM 100 MG PO CAPS
100.0000 mg | ORAL_CAPSULE | Freq: Every day | ORAL | Status: DC
  Administered 2016-06-18 – 2016-06-19 (×2): 100 mg via ORAL
  Filled 2016-06-17 (×2): qty 1

## 2016-06-17 MED ORDER — EPHEDRINE SULFATE-NACL 50-0.9 MG/10ML-% IV SOSY
PREFILLED_SYRINGE | INTRAVENOUS | Status: DC | PRN
  Administered 2016-06-17 (×3): 5 mg via INTRAVENOUS

## 2016-06-17 MED ORDER — SODIUM CHLORIDE 0.9 % IV SOLN: 500.0000 mL | Freq: Once | INTRAVENOUS | Status: DC | PRN

## 2016-06-17 MED ORDER — GUAIFENESIN-DM 100-10 MG/5ML PO SYRP: 15.0000 mL | ORAL_SOLUTION | ORAL | Status: DC | PRN

## 2016-06-17 MED ORDER — SUCCINYLCHOLINE CHLORIDE 200 MG/10ML IV SOSY
PREFILLED_SYRINGE | INTRAVENOUS | Status: AC
  Filled 2016-06-17: qty 10

## 2016-06-17 MED ORDER — PROPOFOL 10 MG/ML IV BOLUS
INTRAVENOUS | Status: DC | PRN
  Administered 2016-06-17: 110 mg via INTRAVENOUS

## 2016-06-17 MED ORDER — DEXTROSE 5 % IV SOLN
INTRAVENOUS | Status: AC
  Filled 2016-06-17: qty 1.5

## 2016-06-17 MED ORDER — DEXAMETHASONE SODIUM PHOSPHATE 10 MG/ML IJ SOLN
INTRAMUSCULAR | Status: AC
  Filled 2016-06-17: qty 1

## 2016-06-17 MED ORDER — AZELASTINE HCL 0.1 % NA SOLN
2.0000 | Freq: Two times a day (BID) | NASAL | Status: DC
  Administered 2016-06-17 – 2016-06-18 (×2): 2 via NASAL
  Filled 2016-06-17: qty 30

## 2016-06-17 MED ORDER — LABETALOL HCL 5 MG/ML IV SOLN
10.0000 mg | INTRAVENOUS | Status: DC | PRN
  Administered 2016-06-17 (×3): 10 mg via INTRAVENOUS
  Filled 2016-06-17: qty 4

## 2016-06-17 MED ORDER — EPHEDRINE 5 MG/ML INJ
INTRAVENOUS | Status: AC
  Filled 2016-06-17: qty 10

## 2016-06-17 MED ORDER — ESMOLOL HCL 100 MG/10ML IV SOLN
INTRAVENOUS | Status: DC | PRN
  Administered 2016-06-17: 20 mg via INTRAVENOUS

## 2016-06-17 MED ORDER — SUGAMMADEX SODIUM 200 MG/2ML IV SOLN
INTRAVENOUS | Status: DC | PRN
  Administered 2016-06-17: 180 mg via INTRAVENOUS

## 2016-06-17 MED ORDER — ROCURONIUM BROMIDE 100 MG/10ML IV SOLN: INTRAVENOUS | Status: DC | PRN

## 2016-06-17 MED ORDER — HYDRALAZINE HCL 20 MG/ML IJ SOLN
5.0000 mg | INTRAMUSCULAR | Status: AC | PRN
  Administered 2016-06-17 (×2): 5 mg via INTRAVENOUS

## 2016-06-17 MED ORDER — ROCURONIUM BROMIDE 10 MG/ML (PF) SYRINGE
PREFILLED_SYRINGE | INTRAVENOUS | Status: DC | PRN
  Administered 2016-06-17: 50 mg via INTRAVENOUS

## 2016-06-17 MED ORDER — MAGNESIUM SULFATE 2 GM/50ML IV SOLN: 2.0000 g | Freq: Every day | INTRAVENOUS | Status: DC | PRN

## 2016-06-17 MED ORDER — SODIUM CHLORIDE 0.9 % IV SOLN: INTRAVENOUS | Status: DC

## 2016-06-17 MED ORDER — SODIUM CHLORIDE 0.9 % IV SOLN
INTRAVENOUS | Status: DC | PRN
  Administered 2016-06-17: 500 mL

## 2016-06-17 MED ORDER — ONDANSETRON HCL 4 MG/2ML IJ SOLN: 4.0000 mg | Freq: Four times a day (QID) | INTRAMUSCULAR | Status: DC | PRN

## 2016-06-17 MED ORDER — FENTANYL CITRATE (PF) 250 MCG/5ML IJ SOLN
INTRAMUSCULAR | Status: DC | PRN
  Administered 2016-06-17: 50 ug via INTRAVENOUS
  Administered 2016-06-17 (×2): 25 ug via INTRAVENOUS
  Administered 2016-06-17: 50 ug via INTRAVENOUS

## 2016-06-17 MED ORDER — METOPROLOL TARTRATE 5 MG/5ML IV SOLN: 2.0000 mg | INTRAVENOUS | Status: DC | PRN

## 2016-06-17 MED ORDER — OXYCODONE-ACETAMINOPHEN 5-325 MG PO TABS: 1.0000 | ORAL_TABLET | Freq: Four times a day (QID) | ORAL | 0 refills | Status: DC | PRN

## 2016-06-17 MED ORDER — PANTOPRAZOLE SODIUM 40 MG PO TBEC
40.0000 mg | DELAYED_RELEASE_TABLET | Freq: Every day | ORAL | Status: DC
  Administered 2016-06-18: 40 mg via ORAL
  Filled 2016-06-17: qty 1

## 2016-06-17 MED ORDER — LIDOCAINE 2% (20 MG/ML) 5 ML SYRINGE
INTRAMUSCULAR | Status: DC | PRN
  Administered 2016-06-17: 60 mg via INTRAVENOUS

## 2016-06-17 MED ORDER — DM-GUAIFENESIN ER 30-600 MG PO TB12
1.0000 | ORAL_TABLET | Freq: Two times a day (BID) | ORAL | Status: DC
  Administered 2016-06-17 – 2016-06-19 (×4): 1 via ORAL
  Filled 2016-06-17 (×4): qty 1

## 2016-06-17 MED ORDER — FENTANYL CITRATE (PF) 100 MCG/2ML IJ SOLN
INTRAMUSCULAR | Status: AC
  Filled 2016-06-17: qty 2

## 2016-06-17 MED ORDER — LIDOCAINE 2% (20 MG/ML) 5 ML SYRINGE
INTRAMUSCULAR | Status: AC
  Filled 2016-06-17: qty 5

## 2016-06-17 MED ORDER — ONDANSETRON HCL 4 MG/2ML IJ SOLN
INTRAMUSCULAR | Status: DC | PRN
  Administered 2016-06-17: 4 mg via INTRAVENOUS

## 2016-06-17 MED ORDER — ENOXAPARIN SODIUM 40 MG/0.4ML ~~LOC~~ SOLN: 40.0000 mg | SUBCUTANEOUS | Status: DC

## 2016-06-17 MED ORDER — ONDANSETRON HCL 4 MG/2ML IJ SOLN
INTRAMUSCULAR | Status: AC
  Filled 2016-06-17: qty 2

## 2016-06-17 MED ORDER — PHENOL 1.4 % MT LIQD: 1.0000 | OROMUCOSAL | Status: DC | PRN

## 2016-06-17 MED ORDER — LORATADINE 10 MG PO TABS
10.0000 mg | ORAL_TABLET | Freq: Every day | ORAL | Status: DC
  Administered 2016-06-18 – 2016-06-19 (×2): 10 mg via ORAL
  Filled 2016-06-17 (×2): qty 1

## 2016-06-17 MED ORDER — PROTAMINE SULFATE 10 MG/ML IV SOLN
INTRAVENOUS | Status: DC | PRN
  Administered 2016-06-17: 20 mg via INTRAVENOUS

## 2016-06-17 MED ORDER — ASPIRIN EC 81 MG PO TBEC
81.0000 mg | DELAYED_RELEASE_TABLET | Freq: Every day | ORAL | Status: DC
  Administered 2016-06-18 – 2016-06-19 (×2): 81 mg via ORAL
  Filled 2016-06-17 (×2): qty 1

## 2016-06-17 MED ORDER — FENTANYL CITRATE (PF) 100 MCG/2ML IJ SOLN: 25.0000 ug | INTRAMUSCULAR | Status: DC | PRN

## 2016-06-17 MED ORDER — LABETALOL HCL 5 MG/ML IV SOLN
INTRAVENOUS | Status: AC
  Administered 2016-06-17: 10 mg via INTRAVENOUS
  Filled 2016-06-17: qty 4

## 2016-06-17 MED ORDER — MORPHINE SULFATE (PF) 2 MG/ML IV SOLN: 2.0000 mg | INTRAVENOUS | Status: DC | PRN

## 2016-06-17 MED ORDER — POTASSIUM CHLORIDE CRYS ER 20 MEQ PO TBCR: 20.0000 meq | EXTENDED_RELEASE_TABLET | Freq: Every day | ORAL | Status: DC | PRN

## 2016-06-17 MED ORDER — PROPOFOL 10 MG/ML IV BOLUS
INTRAVENOUS | Status: AC
  Filled 2016-06-17: qty 20

## 2016-06-17 MED ORDER — PROTAMINE SULFATE 10 MG/ML IV SOLN
INTRAVENOUS | Status: AC
  Filled 2016-06-17: qty 5

## 2016-06-17 MED ORDER — 0.9 % SODIUM CHLORIDE (POUR BTL) OPTIME
TOPICAL | Status: DC | PRN
  Administered 2016-06-17: 1000 mL

## 2016-06-17 MED ORDER — PHENYLEPHRINE HCL 10 MG/ML IJ SOLN
INTRAVENOUS | Status: DC | PRN
  Administered 2016-06-17: 15 ug/min via INTRAVENOUS

## 2016-06-17 MED ORDER — HEPARIN SODIUM (PORCINE) 1000 UNIT/ML IJ SOLN
INTRAMUSCULAR | Status: DC | PRN
Start: 2016-06-17 — End: 2016-06-17
  Administered 2016-06-17: 9000 [IU] via INTRAVENOUS

## 2016-06-17 MED ORDER — CEFUROXIME SODIUM 1.5 G IJ SOLR
1.5000 g | Freq: Two times a day (BID) | INTRAMUSCULAR | Status: AC
  Administered 2016-06-17 – 2016-06-18 (×2): 1.5 g via INTRAVENOUS
  Filled 2016-06-17 (×3): qty 1.5

## 2016-06-17 SURGICAL SUPPLY — 62 items
ADH SKN CLS APL DERMABOND .7 (GAUZE/BANDAGES/DRESSINGS) ×2
BLADE SURG CLIPPER 3M 9600 (MISCELLANEOUS) ×2 IMPLANT
CANISTER SUCT 3000ML PPV (MISCELLANEOUS) ×2 IMPLANT
CATH ANGIO 5F BER2 65CM (CATHETERS) ×1 IMPLANT
CATH BEACON 5.038 65CM KMP-01 (CATHETERS) IMPLANT
CATH OMNI FLUSH .035X70CM (CATHETERS) ×1 IMPLANT
COVER DOME SNAP 22 D (MISCELLANEOUS) ×1 IMPLANT
COVER PROBE W GEL 5X96 (DRAPES) ×2 IMPLANT
DERMABOND ADVANCED (GAUZE/BANDAGES/DRESSINGS) ×2
DERMABOND ADVANCED .7 DNX12 (GAUZE/BANDAGES/DRESSINGS) ×1 IMPLANT
DEVICE CLOSURE PERCLS PRGLD 6F (VASCULAR PRODUCTS) IMPLANT
DEVICE TORQUE KENDALL .025-038 (MISCELLANEOUS) ×1 IMPLANT
DRAPE ZERO GRAVITY STERILE (DRAPES) ×2 IMPLANT
DRSG TEGADERM 2-3/8X2-3/4 SM (GAUZE/BANDAGES/DRESSINGS) ×4 IMPLANT
DRSG TEGADERM 4X4.75 (GAUZE/BANDAGES/DRESSINGS) ×2 IMPLANT
DRYSEAL FLEXSHEATH 12FR 33CM (SHEATH) ×1
DRYSEAL FLEXSHEATH 18FR 33CM (SHEATH) ×1
ELECT CAUTERY BLADE 6.4 (BLADE) ×1 IMPLANT
ELECT REM PT RETURN 9FT ADLT (ELECTROSURGICAL) ×4
ELECTRODE REM PT RTRN 9FT ADLT (ELECTROSURGICAL) ×2 IMPLANT
EXCLUDER TNK LEG 28MX12X16 (Endovascular Graft) IMPLANT
EXCLUDER TRUNK LEG 28MX12X16 (Endovascular Graft) ×2 IMPLANT
GAUZE SPONGE 2X2 8PLY STRL LF (GAUZE/BANDAGES/DRESSINGS) ×2 IMPLANT
GLOVE BIO SURGEON STRL SZ7.5 (GLOVE) ×2 IMPLANT
GOWN STRL REUS W/ TWL LRG LVL3 (GOWN DISPOSABLE) ×2 IMPLANT
GOWN STRL REUS W/ TWL XL LVL3 (GOWN DISPOSABLE) ×1 IMPLANT
GOWN STRL REUS W/TWL LRG LVL3 (GOWN DISPOSABLE) ×4
GOWN STRL REUS W/TWL XL LVL3 (GOWN DISPOSABLE) ×2
GRAFT BALLN CATH 65CM (STENTS) IMPLANT
GUIDEWIRE ANGLED .035X150CM (WIRE) ×1 IMPLANT
KIT BASIN OR (CUSTOM PROCEDURE TRAY) ×2 IMPLANT
KIT ROOM TURNOVER OR (KITS) ×2 IMPLANT
LEG CONTRALATERAL 16X12X14 (Vascular Products) ×2 IMPLANT
NDL PERC 18GX7CM (NEEDLE) ×1 IMPLANT
NEEDLE PERC 18GX7CM (NEEDLE) ×2 IMPLANT
NS IRRIG 1000ML POUR BTL (IV SOLUTION) ×2 IMPLANT
PACK ENDOVASCULAR (PACKS) ×2 IMPLANT
PAD ARMBOARD 7.5X6 YLW CONV (MISCELLANEOUS) ×4 IMPLANT
PENCIL BUTTON HOLSTER BLD 10FT (ELECTRODE) ×1 IMPLANT
PERCLOSE PROGLIDE 6F (VASCULAR PRODUCTS) ×8
SHEATH AVANTI 11CM 8FR (MISCELLANEOUS) ×1 IMPLANT
SHEATH BRITE TIP 8FR 23CM (MISCELLANEOUS) ×1 IMPLANT
SHEATH DRYSEAL FLEX 12FR 33CM (SHEATH) IMPLANT
SHEATH DRYSEAL FLEX 18FR 33CM (SHEATH) IMPLANT
SHIELD RADPAD SCOOP 12X17 (MISCELLANEOUS) ×4 IMPLANT
SPONGE GAUZE 2X2 STER 10/PKG (GAUZE/BANDAGES/DRESSINGS) ×2
STENT GRAFT BALLN CATH 65CM (STENTS) ×1
STENT GRAFT CONTRALAT 16X12X14 (Vascular Products) IMPLANT
STOPCOCK MORSE 400PSI 3WAY (MISCELLANEOUS) ×2 IMPLANT
SUT ETHILON 3 0 PS 1 (SUTURE) IMPLANT
SUT MNCRL AB 4-0 PS2 18 (SUTURE) ×4 IMPLANT
SUT PROLENE 5 0 C 1 24 (SUTURE) IMPLANT
SUT VIC AB 2-0 CT1 27 (SUTURE)
SUT VIC AB 2-0 CT1 TAPERPNT 27 (SUTURE) IMPLANT
SUT VIC AB 3-0 SH 27 (SUTURE)
SUT VIC AB 3-0 SH 27X BRD (SUTURE) IMPLANT
SYR 30ML LL (SYRINGE) ×2 IMPLANT
TRAY FOLEY W/METER SILVER 16FR (SET/KITS/TRAYS/PACK) ×2 IMPLANT
TUBING HIGH PRESSURE 120CM (CONNECTOR) ×2 IMPLANT
WIRE AMPLATZ SS-J .035X180CM (WIRE) ×1 IMPLANT
WIRE BENTSON .035X145CM (WIRE) ×2 IMPLANT
WIRE LUNDERQUIST .035X180CM (WIRE) ×1 IMPLANT

## 2016-06-17 NOTE — H&P (Signed)
   History and Physical Update  The patient was interviewed and re-examined.  The patient's previous History and Physical has been reviewed and is unchanged from office visit. Plan for EVAR from bilateral femoral approach.   Grayson Pfefferle C. Donzetta Matters, MD [facility] [place] Office: [phone] Pager: [phone]   06/17/2016, 7:04 AM

## 2016-06-17 NOTE — Progress Notes (Signed)
  [facility] Day of Surgery Note  Subjective:  Patient seen in PACU. No complaints.   Vitals:   06/17/16 1106 06/17/16 1112  BP: (!) 158/91   Pulse:  (!) 58  Resp:  12  Temp:     Abdomen soft Groins without hematoma Palpable DP pulses b/l. Palpable right PT.   Assessment/Plan:  This is a 83 y.o. male who is s/p EVAR  Stable post-op Keep SBP <=160. To 4E when bed available.   Virgina Jock, [place] Pager: [phone] 06/17/2016 11:21 AM

## 2016-06-17 NOTE — Progress Notes (Signed)
Dr Donzetta Matters here-aware art line 20 mm > cuff BP. He wants arterial line BP rx to keep SBP</= 160. Will cont to monitor closely.

## 2016-06-17 NOTE — Transfer of Care (Signed)
Immediate Anesthesia Transfer of Care Note  Patient: [name] [name]  Procedure(s) Performed: Procedure(s): ABDOMINAL AORTIC ENDOVASCULAR STENT GRAFT (N/A)  Patient Location: PACU  Anesthesia Type:General  Level of Consciousness: awake, alert  and oriented  Airway & Oxygen Therapy: Patient Spontanous Breathing and Patient connected to nasal cannula oxygen  Post-op Assessment: Report given to RN and Post -op Vital signs reviewed and stable  Post vital signs: Reviewed and stable  Last Vitals:  Vitals:   06/17/16 0629  BP: (!) 160/82  Pulse: 75  Temp: 36.7 C    Last Pain:  Vitals:   06/17/16 0629  TempSrc: Oral         Complications: No apparent anesthesia complications

## 2016-06-17 NOTE — Anesthesia Postprocedure Evaluation (Signed)
Anesthesia Post Note  Patient: Demetrias Goodbar  Procedure(s) Performed: Procedure(s) (LRB): ABDOMINAL AORTIC ENDOVASCULAR STENT GRAFT (N/A)  Patient location during evaluation: PACU Anesthesia Type: General Level of consciousness: awake and alert Pain management: pain level controlled Vital Signs Assessment: post-procedure vital signs reviewed and stable Respiratory status: spontaneous breathing, nonlabored ventilation, respiratory function stable and patient connected to nasal cannula oxygen Cardiovascular status: blood pressure returned to baseline and stable Postop Assessment: no signs of nausea or vomiting Anesthetic complications: no       Last Vitals:  Vitals:   06/17/16 1106 06/17/16 1112  BP: (!) 158/91   Pulse:  (!) 58  Resp:  12  Temp:      Last Pain:  Vitals:   06/17/16 1058  TempSrc:   PainSc: 0-No pain                 Davidson Palmieri,W. EDMOND

## 2016-06-17 NOTE — Anesthesia Procedure Notes (Signed)
Procedure Name: Intubation Date/Time: 06/17/2016 7:54 AM Performed by: Mariea Clonts Pre-anesthesia Checklist: Patient identified, Emergency Drugs available, Suction available and Patient being monitored Patient Re-evaluated:Patient Re-evaluated prior to inductionOxygen Delivery Method: Circle System Utilized Preoxygenation: Pre-oxygenation with 100% oxygen Intubation Type: IV induction Ventilation: Mask ventilation without difficulty Laryngoscope Size: Miller and 2 Grade View: Grade I Tube type: Oral Number of attempts: 2 Airway Equipment and Method: Stylet and Oral airway Placement Confirmation: ETT inserted through vocal cords under direct vision,  positive ETCO2 and breath sounds checked- equal and bilateral Secured at: 22 cm Tube secured with: Tape Dental Injury: Teeth and Oropharynx as per pre-operative assessment

## 2016-06-17 NOTE — Op Note (Signed)
Patient name: [name] [name] MRN: [id number] DOB: [birth year] Sex: male  06/17/2016 Pre-operative Diagnosis: AAA Post-operative diagnosis:  Same Surgeon:  Erlene Quan C. Donzetta Matters, MD Assistant: Silva Bandy, PA Procedure Performed: 1.  Percutaneous access of bilateral common femoral artery 2.  Aorto Bi-iliac endovascular stent graft with ipsilateral right main body 28 x 12 x 16cm and contralateral limb 12 x 14cm  Indications:  83yo WM with history of AAA indicated for repair at 6 cm. We have discussed the risk and benefits and alternatives to procedure and consent has been signed.  Findings: There is large abdominal aortic aneurysm by aortogram. After exclusion of the aneurysm with endovascular graft there are no type I or 3 endoleak's with possible late type II from lumbar arteries and possible type II from an IMA. The dorsalis pedis arteries were palpable at completion.   Procedure:  The patient was identified in the holding area and taken to wear he was placed supine on the operating table and general endotracheal anesthesia was induced. He was then sterilely prepped and draped in the usual fashion for endovascular repair of an aneurysm and timeout called. Antibiotics were administered. We used ultrasound to identify his left common femoral artery a transverse skin neck was made and the artery cannulated with 18-gauge needle and wire passed. We then dilated with 8 French sheath dilator and deployed to proglide devices one laterally one medially and then placed the 8 French sheath in the hole. Similarly on the right the common femoral artery was identified a transverse skin nick was made and artery cannulated easily with 18-gauge needle. Wire was passed and we again dilated with 8 [place] dilator. We then deployed to proglide devices. We then placed an 8 French sheath and using bare catheter exchanged for stiff wire and exchanged for 18 French sheath into the aorta. The patient was heparinized at this time.The  main body was brought through the sheath placed at the level of L2 and a Omni flush catheter brought through the left sided sheath had aortogram performed to demonstrate the renal arteries the lowest being the right. We then opened the main body to the level of the gait. We then did attempt to cannulate the gate with bare catheter and Omni catheter from the left side. Ultimately we reconstructed in the gait placed a Lunderquist wire advanced a 12 French sheath from the contralateral side up to the level of the gate and were then able to cannulate with bare catheter and Glidewire. We then performed an aortogram to demonstrate that R renal artery was still patent as well as our left hypogastric artery measured out approximately 14 cm with recent meters of overlap to the level of a hypogastric artery. We then brought the contralateral limb and deployed this under fluoroscopic guidance. Retrograde angiogram demonstrated that are hypogastric artery was still patent. We then turned our attention to the right side where a retrograde angiogram demonstrated a hypogastric artery and we then fully deployed our graft. We ironed the graft with Q 50 balloon from both sides. We then performed completion angiogram with findings above of excluded aneurysm with no type I or 3 endoleak's. Satisfied with this we closed our femoral arteries with our proglide devices. He did have palpable dorsalis pedis arteries at this time and he was given 20 mg of protamine. The skin next were closed with 4-0 Monocryl and Dermabond was placed above that. Patient was allowed awaken from anesthesia having tolerated the procedure well without immediate competition.  Blood loss: 300cc  Contrast: 70cc   Camilo Mander C. Donzetta Matters, MD [facility] [place] Office: [phone] Pager: [phone]

## 2016-06-17 NOTE — Anesthesia Procedure Notes (Addendum)
Arterial Line Insertion Start/End3/29/2018 7:50 AM, 06/17/2016 8:00 AM Performed by: Roderic Palau, anesthesiologist  Patient location: OR. Preanesthetic checklist: patient identified, IV checked, site marked, risks and benefits discussed, surgical consent, monitors and equipment checked, pre-op evaluation, timeout performed and anesthesia consent Patient sedated Left, brachial was placed Catheter size: 18 Fr Hand hygiene performed , maximum sterile barriers used  and Seldinger technique used  Attempts: 1 Procedure performed using ultrasound guided technique. Ultrasound Notes:anatomy identified, needle tip was noted to be adjacent to the nerve/plexus identified, no ultrasound evidence of intravascular and/or intraneural injection and image(s) printed for medical record Following insertion, line sutured, dressing applied and Biopatch. Post procedure assessment: normal  Patient tolerated the procedure well with no immediate complications.

## 2016-06-17 NOTE — Care Management Note (Signed)
Case Management Note  Patient Details  Name: [name] [name] MRN: [id number] Date of Birth: 12/30/1929  Subjective/Objective:   s/p EVAR, NCM will cont to follow patient's progression.                  Action/Plan:   Expected Discharge Date:                  Expected Discharge Plan:  [place]  In-House Referral:     Discharge planning Services  CM Consult  Post Acute Care Choice:    Choice offered to:     DME Arranged:    DME Agency:     HH Arranged:    [place] Agency:     Status of Service:  In process, will continue to follow  If discussed at Long Length of Stay Meetings, dates discussed:    Additional Comments:  Zenon Mayo, RN 06/17/2016, 4:59 PM

## 2016-06-18 ENCOUNTER — Inpatient Hospital Stay (HOSPITAL_COMMUNITY): Payer: Medicare Other

## 2016-06-18 ENCOUNTER — Encounter (HOSPITAL_COMMUNITY): Payer: Self-pay | Admitting: Vascular Surgery

## 2016-06-18 LAB — CBC
HEMATOCRIT: 33.6 % — AB (ref 39.0–52.0)
HEMOGLOBIN: 11.1 g/dL — AB (ref 13.0–17.0)
MCH: 32 pg (ref 26.0–34.0)
MCHC: 33 g/dL (ref 30.0–36.0)
MCV: 96.8 fL (ref 78.0–100.0)
Platelets: 110 10*3/uL — ABNORMAL LOW (ref 150–400)
RBC: 3.47 MIL/uL — AB (ref 4.22–5.81)
RDW: 14.2 % (ref 11.5–15.5)
WBC: 8.9 10*3/uL (ref 4.0–10.5)

## 2016-06-18 LAB — BASIC METABOLIC PANEL
ANION GAP: 11 (ref 5–15)
BUN: 8 mg/dL (ref 6–20)
CALCIUM: 8.6 mg/dL — AB (ref 8.9–10.3)
CHLORIDE: 103 mmol/L (ref 101–111)
CO2: 27 mmol/L (ref 22–32)
Creatinine, Ser: 1.09 mg/dL (ref 0.61–1.24)
GFR calc non Af Amer: >60 mL/min (ref >60)
Glucose, Bld: 82 mg/dL (ref 65–99)
POTASSIUM: 3.5 mmol/L (ref 3.5–5.1)
Sodium: 141 mmol/L (ref 135–145)

## 2016-06-18 MED ORDER — FUROSEMIDE 10 MG/ML IJ SOLN
20.0000 mg | Freq: Once | INTRAMUSCULAR | Status: AC
  Administered 2016-06-18: 20 mg via INTRAVENOUS
  Filled 2016-06-18: qty 2

## 2016-06-18 MED ORDER — POTASSIUM CHLORIDE ER 10 MEQ PO TBCR
10.0000 meq | EXTENDED_RELEASE_TABLET | Freq: Once | ORAL | Status: AC
  Administered 2016-06-18: 10 meq via ORAL
  Filled 2016-06-18 (×2): qty 1

## 2016-06-18 NOTE — Discharge Summary (Signed)
EVAR Discharge Summary   Artist Bloom Jun 14, 1929 83 y.o. male  MRN: [id number]  Admission Date: 06/17/2016  Discharge Date: 06/18/16  Physician: Waynetta Sandy*  Admission Diagnosis: Abdominal aortic aneurysm I71.4   HPI:   This is a 83 y.o. male  with history of bilateral carotid endarterectomies and Donnell aortic aneurysm for which he has been followed for many years. He also has prominent fibrosis for which she is followed by Dr. Lenna Gilford. His followed by Dr. Johnsie Cancel for his coronary disease. He has not had stroke TIA or amaurosis. He does have some memory loss. He is post wear oxygen for his proliferative fibrosis but occasionally and does not wear. He continues to plague off as much as he can. He does not have new back or abdominal pain to suggest symptomatic aneurysm. Recent ultrasound demonstrated growth of his aneurysm for which he underwent CT scan is here for follow-up today.  Hospital Course:  The patient was admitted to the hospital and taken to the operating room on 06/17/2016 and underwent: 1.  Percutaneous access of bilateral common femoral artery 2.  Aorto Bi-iliac endovascular stent graft with ipsilateral right main body 28 x 12 x 16cm and contralateral limb 12 x 14cm    The pt tolerated the procedure well and was transported to the PACU in good condition.   In the recovery room, pt was doing well with palpable pedal pulses bilaterally.    On POD 1, he is doing well.  Bilateral groins are soft without hematoma.  He is ambulating.  He continues to be on O2, however, he is on oxygen at home.    The remainder of the hospital course consisted of increasing mobilization and increasing intake of solids without difficulty.  CBC    Component Value Date/Time   WBC 11.3 (H) 06/17/2016 1015   RBC 3.92 (L) 06/17/2016 1015   HGB 12.3 (L) 06/17/2016 1015   HCT 37.5 (L) 06/17/2016 1015   PLT 129 (L) 06/17/2016 1015   MCV 95.7 06/17/2016 1015   MCH 31.4 06/17/2016  1015   MCHC 32.8 06/17/2016 1015   RDW 13.8 06/17/2016 1015   LYMPHSABS 1.0 10/15/2015 1158   MONOABS 0.7 10/15/2015 1158   EOSABS 0.1 10/15/2015 1158   BASOSABS 0.0 10/15/2015 1158    BMET    Component Value Date/Time   NA 141 06/18/2016 0445   K 3.5 06/18/2016 0445   CL 103 06/18/2016 0445   CO2 27 06/18/2016 0445   GLUCOSE 82 06/18/2016 0445   BUN 8 06/18/2016 0445   CREATININE 1.09 06/18/2016 0445   CALCIUM 8.6 (L) 06/18/2016 0445   GFRNONAA >60 06/18/2016 0445   GFRAA >60 06/18/2016 0445       Discharge Instructions    ABDOMINAL PROCEDURE/ANEURYSM REPAIR/AORTO-BIFEMORAL BYPASS:  Call MD for increased abdominal pain; cramping diarrhea; nausea/vomiting    Complete by:  As directed    Call MD for:  redness, tenderness, or signs of infection (pain, swelling, bleeding, redness, odor or green/yellow discharge around incision site)    Complete by:  As directed    Call MD for:  severe or increased pain, loss or decreased feeling  in affected limb(s)    Complete by:  As directed    Call MD for:  temperature >100.5    Complete by:  As directed    Discharge wound care:    Complete by:  As directed    Take dressings off groins when you get home. Shower as normal. You may  reapply a band-aid to these incisions if you would like. Otherwise, dressings not necessary.   Driving Restrictions    Complete by:  As directed    No driving for 1 week   Increase activity slowly    Complete by:  As directed    Walk with assistance use walker or cane as needed   Lifting restrictions    Complete by:  As directed    No lifting for 4 weeks   Resume previous diet    Complete by:  As directed       Discharge Diagnosis:  Abdominal aortic aneurysm I71.4  Secondary Diagnosis: Patient Active Problem List   Diagnosis Date Noted  . Abdominal aortic aneurysm without rupture ([place]) 06/17/2016  . Exercise hypoxemia 12/03/2015  . IPF (idiopathic pulmonary fibrosis) ([place]) 07/11/2015  . H/O  endovascular stent graft for abdominal aortic aneurysm 07/11/2015  . H/O carotid endarterectomy 07/11/2015  . History of stroke 05/06/2015  . Swelling of ankle joint 04/23/2014  . Memory loss 05/29/2013  . Mild cognitive impairment 05/29/2013  . CAD (coronary artery disease) 05/25/2013  . AAA (abdominal aortic aneurysm) without rupture ([place]) 05/15/2013  . Occlusion and stenosis of carotid artery with cerebral infarction 04/19/2013  . CVA (cerebral infarction) 04/18/2013  . Other and unspecified hyperlipidemia 04/18/2013  . Carotid stenosis 04/18/2013  . Aneurysm of abdominal vessel ([place]) 04/18/2012  . Occlusion and stenosis of carotid artery without mention of cerebral infarction 10/12/2011   Past Medical History:  Diagnosis Date  . AAA (abdominal aortic aneurysm) ([place])   . Allergy   . Carotid artery stenosis   . Diplopia   . GERD (gastroesophageal reflux disease)   . Hyperlipidemia   . Prostate cancer ([place])   . Pulmonary fibrosis ([place])   . Spondylisthesis 2015   Spine  . Stroke ([place]) 04/18/13   some weakness left-returning strength  . TIA (transient ischemic attack)      Allergies as of 06/18/2016      Reactions   Sudafed [pseudoephedrine Hcl]    Causes dizziness and nervousness   Atorvastatin Other (See Comments)   Reaction: Patient's family reports "confusion" and would prefer crestor       Medication List    TAKE these medications   acetaminophen 500 MG tablet Commonly known as:  TYLENOL Take 500 mg by mouth every 6 (six) hours as needed for mild pain.   aspirin EC 81 MG tablet Take 81 mg by mouth daily.   azelastine 0.1 % nasal spray Commonly known as:  ASTELIN Place 2 sprays into both nostrils 2 (two) times daily.   CENTRUM SILVER PO Take 1 tablet by mouth daily.   CEREFOLIN 08-20-48-5 MG Tabs Take 1 tablet by mouth daily.   cetirizine 10 MG tablet Commonly known as:  ZYRTEC Take 10 mg by mouth daily as needed for allergies.     dextromethorphan-guaiFENesin 30-600 MG 12hr tablet Commonly known as:  MUCINEX DM Take 1 tablet by mouth 2 (two) times daily.   FISH OIL PO Take 1 capsule by mouth daily.   naproxen sodium 220 MG tablet Commonly known as:  ANAPROX Take 220 mg by mouth daily as needed (pain).   oxyCODONE-acetaminophen 5-325 MG tablet Commonly known as:  PERCOCET/ROXICET Take 1 tablet by mouth every 6 (six) hours as needed.   pantoprazole 40 MG tablet Commonly known as:  PROTONIX Take 1 tablet (40 mg total) by mouth daily. 30 minutes before dinner What changed:  when to take this  additional  instructions   predniSONE 10 MG tablet Commonly known as:  DELTASONE TAKE 1 TABLET EVERY DAY WITH BREAKFAST.   Resveratrol 100 MG Caps Take 100 mg by mouth every evening.   rosuvastatin 5 MG tablet Commonly known as:  CRESTOR Take 5 mg by mouth at bedtime.       Prescriptions given: Roxicet #8 No Refill  Instructions: 1.  No heavy lifting x 4 weeks 2.  No driving x 2 weeks and while taking pain medication 3.  Shower daily starting 06/19/16  Disposition: home  Patient's condition: is Good  Follow up: 1. Dr. Donzetta Matters in 4 weeks with CTA protocol   Leontine Locket, PA-C [facility] [phone] 06/18/2016  8:00 AM   - For VQI Registry use - Post-op:  Time to Extubation: [x]  In OR, [ ]  < 12 hrs, [ ]  12-24 hrs, [ ]  >=24 hrs Vasopressors Req. Post-op: No MI: No., [ ]  Troponin only, [ ]  EKG or Clinical New Arrhythmia: No CHF: No ICU Stay: 1 day in stepdown Transfusion: No     If yes, n/a units given  Complications: Resp failure: No., [ ]  Pneumonia, [ ]  Ventilator Chg in renal function: No., [ ]  Inc. Cr > 0.5, [ ]  Temp. Dialysis,  [ ]  Permanent dialysis Leg ischemia: No., no Surgery needed, [ ]  Yes, Surgery needed,  [ ]  Amputation Bowel ischemia: No., [ ]  Medical Rx, [ ]  Surgical Rx Wound complication: No., [ ]  Superficial separation/infection, [ ]  Return to  OR Return to OR: No  Return to OR for bleeding: No Stroke: No., [ ]  Minor, [ ]  Major  Discharge medications: Statin use:  Yes If No:  ASA use:  Yes  If No:  Plavix use:  No  Beta blocker use:  No  ARB use:  No ACEI use:  No CCB use:  No

## 2016-06-18 NOTE — Progress Notes (Signed)
Called about pt's O2 saturations while ambulating.  Pt's CXR yesterday with Increase in diffuse interstitial and airspace pattern likely representing edema superimposed on chronic interstitial lung disease.    Will give a gentle dose of lasix 20mg  IV x 1 dose with Kdur 44mEq x 1.  Use IS every hour.  Will try to ambulate pt again in a little while.  Most likely will need to stay another day, but will see how he does this afternoon.  Pt is on home 2LO2NC mostly at night, but also when he needs it.    Leontine Locket, [facility] 06/18/2016 11:35 AM

## 2016-06-18 NOTE — Progress Notes (Signed)
Patient's wife went to get lunch and while she has been gone patient has been very impulsive. Patient has gotten up twice without assistance and oxygen saturations have dropped down in the 60's-70's both times.  Patient is remaining on 3-4 liters of oxygen to maintain saturation but drops very quickly with movement or when he pulls the oxygen off. Patient has bed alarm on and is being monitored closely.

## 2016-06-18 NOTE — Progress Notes (Signed)
RT called to assess patient due to sats dropping while walking. Fine crackles noted on ascultation. Vitals WNL.However, when patient asked to sit forward, patient sats dropped from 95% to 86%. Patient stated he had no symptoms from this. Incentive performed with patient and instructed to do 10 times every hour while awake. Suggested a CXR and possible orthostatic vitals. RN made aware.

## 2016-06-18 NOTE — Progress Notes (Addendum)
  Progress Note    06/18/2016 7:56 AM 1 Day Post-Op  Subjective:  Pt says he's sore where his IV's are.   Tm 99.6 now afebrile HR 60's-80's NSR 956'O-130'Q systolic 65% [id number]  Vitals:   06/18/16 0700 06/18/16 0739  BP: 109/68 109/68  Pulse: 89 77  Resp: (!) 29 (!) 21  Temp: 98.7 F (37.1 C) 98.7 F (37.1 C)    Physical Exam: Cardiac:  regular Lungs:  Non labored Incisions:   Bilateral groins are soft without hematoma Extremities:  +palpable bilateral DP/PT pulses Abdomen:  Soft, NT/ND  CBC    Component Value Date/Time   WBC 11.3 (H) 06/17/2016 1015   RBC 3.92 (L) 06/17/2016 1015   HGB 12.3 (L) 06/17/2016 1015   HCT 37.5 (L) 06/17/2016 1015   PLT 129 (L) 06/17/2016 1015   MCV 95.7 06/17/2016 1015   MCH 31.4 06/17/2016 1015   MCHC 32.8 06/17/2016 1015   RDW 13.8 06/17/2016 1015   LYMPHSABS 1.0 10/15/2015 1158   MONOABS 0.7 10/15/2015 1158   EOSABS 0.1 10/15/2015 1158   BASOSABS 0.0 10/15/2015 1158    BMET    Component Value Date/Time   NA 141 06/18/2016 0445   K 3.5 06/18/2016 0445   CL 103 06/18/2016 0445   CO2 27 06/18/2016 0445   GLUCOSE 82 06/18/2016 0445   BUN 8 06/18/2016 0445   CREATININE 1.09 06/18/2016 0445   CALCIUM 8.6 (L) 06/18/2016 0445   GFRNONAA >60 06/18/2016 0445   GFRAA >60 06/18/2016 0445    INR    Component Value Date/Time   INR 1.26 06/17/2016 1015     Intake/Output Summary (Last 24 hours) at 06/18/16 0756 Last data filed at 06/18/16 0600  Gross per 24 hour  Intake          2859.17 ml  Output             2530 ml  Net           329.17 ml     Assessment:  83 y.o. male is s/p:  1.  Percutaneous access of bilateral common femoral artery 2.  Aorto Bi-iliac endovascular stent graft with ipsilateral right main body 28 x 12 x 16cm and contralateral limb 12 x 14cm  1 Day Post-Op  Plan: -pt doing well this morning-pt states he has not walked, but RN tells me he walked on nightshift.  We will get him to walk again this  morning -CBC pending this morning -pt states he is on O2 at home mostly at night and when he needs it.  He lives with his wife. -DVT prophylaxis:  Lovenox -dc home later this morning.   Leontine Locket, PA-C [facility] [phone] 06/18/2016 7:56 AM  Addendum  I have independently interviewed and examined the patient, and I agree with the physician assistant's findings.  Ok to D/C  Adele Barthel, MD, [facility] [place] Office: [phone] Pager: [phone]  06/18/2016, 9:44 AM

## 2016-06-18 NOTE — Progress Notes (Signed)
Ambulated patient 140 feet. During ambulation patient's oxygen saturation dropped into the mid 60's. Increased patient's oxygen to 4 liters but patient had a hard time recovering and didn't get back up to 80's until sitting in the chair for several minutes on 4 liters. Paged PA and will continue to monitor and adjust oxygen as needed.

## 2016-06-18 NOTE — Progress Notes (Signed)
Patient's wife is concerned about taking patient home. Every time patient stands to use the urinal pt's oxygen saturation drops into the 70's. Will notify PA.

## 2016-06-19 NOTE — Progress Notes (Signed)
Pt education completed to include future appointments, current prescriptions and medications, and doctors discharge instructions. Pt alert and oriented, vital signs stable. Pt discharged with nursing staff via wheel chair. Temp: 98.9 F (37.2 C) (03/31 0720) Temp Source: Oral (03/31 0720) BP: 97/67 (03/31 0720) Pulse Rate: 65 (03/31 0720)  Wray Kearns 06/19/2016 9:57 AM

## 2016-06-19 NOTE — Progress Notes (Signed)
Patient walked this morning in the hallway, I had to bumped up oxygen to 6L to keep SAT up in the 90's. Pt tolerated well but does drop as soon as O2 is taken off or decrease while ambulating.

## 2016-06-19 NOTE — Progress Notes (Signed)
   Daily Progress Note   Assessment/Planning: POD #2 s/p EVAR, baseline oxygen dependence due to pulmonary fibrosis   No significant response to diuresis suggesting pt at his baseline in regards to his pulmonary function  No advantage to keeping hospitalized as nothing else being done for his pulm fibrosis while admitted  D/C home  Pt will follow up with Pulmonology next week    Subjective  - 2 Days Post-Op  No events overnight, improved sat while ambulating  Objective Vitals:   06/19/16 0004 06/19/16 0009 06/19/16 0349 06/19/16 0720  BP: 116/76 116/76 116/69 97/67  Pulse: 76 69 67 65  Resp: 18 17 15 13   Temp: 98.9 F (37.2 C) 98.5 F (36.9 C) 98.7 F (37.1 C) 98.9 F (37.2 C)  TempSrc: Oral Oral Oral Oral  SpO2: 94% 94% 96% 97%  Weight:      Height:         Intake/Output Summary (Last 24 hours) at 06/19/16 0921 Last data filed at 06/19/16 0900  Gross per 24 hour  Intake              720 ml  Output              500 ml  Net              220 ml    PULM  BLL rales pCXR (06/19/15) 1. Findings are similar to the previous day's study. Suspect findings are due to the patient's chronic interstitial fibrosis. Superimposed infection is possible. No convincing pulmonary edema on today's exam.  CV  RRR GI  soft, NTND VASC  Viable warm B feet  Laboratory CBC    Component Value Date/Time   WBC 8.9 06/18/2016 0630   HGB 11.1 (L) 06/18/2016 0630   HCT 33.6 (L) 06/18/2016 0630   PLT 110 (L) 06/18/2016 0630    BMET    Component Value Date/Time   NA 141 06/18/2016 0445   K 3.5 06/18/2016 0445   CL 103 06/18/2016 0445   CO2 27 06/18/2016 0445   GLUCOSE 82 06/18/2016 0445   BUN 8 06/18/2016 0445   CREATININE 1.09 06/18/2016 0445   CALCIUM 8.6 (L) 06/18/2016 0445   GFRNONAA >60 06/18/2016 0445   GFRAA >60 06/18/2016 0445     Adele Barthel, MD, FACS [facility] Office: [phone] Pager: [phone]  06/19/2016, 9:21  AM

## 2016-06-21 ENCOUNTER — Encounter (HOSPITAL_COMMUNITY): Payer: Self-pay | Admitting: Vascular Surgery

## 2016-06-21 ENCOUNTER — Telehealth: Payer: Self-pay | Admitting: Vascular Surgery

## 2016-06-21 NOTE — Telephone Encounter (Signed)
Left mssg with spouse for appts, dates and times ok'd mailed letter with CTA Protocol on 5/1 an PO on 5/4

## 2016-06-21 NOTE — Telephone Encounter (Signed)
-----   Message from Mena Goes, RN sent at 06/17/2016 12:17 PM EDT ----- Regarding: 4 weeks w/ CTA    ----- Message ----- From: Alvia Grove, PA-C Sent: 06/17/2016   9:55 AM To: [facility] Charge Pool  s/p evar 06/17/16  f/u with Dr. Donzetta Matters in 4 weeks with CTA  Thanks Maudie Mercury

## 2016-06-21 NOTE — Addendum Note (Signed)
Addended by: Lianne Cure A on: 06/21/2016 09:36 AM   Modules accepted: Orders

## 2016-07-14 ENCOUNTER — Encounter: Payer: Self-pay | Admitting: Vascular Surgery

## 2016-07-17 DIAGNOSIS — I2510 Atherosclerotic heart disease of native coronary artery without angina pectoris: Secondary | ICD-10-CM | POA: Diagnosis not present

## 2016-07-17 DIAGNOSIS — J84112 Idiopathic pulmonary fibrosis: Secondary | ICD-10-CM | POA: Diagnosis not present

## 2016-07-20 ENCOUNTER — Other Ambulatory Visit: Payer: Medicare Other

## 2016-07-20 ENCOUNTER — Other Ambulatory Visit (INDEPENDENT_AMBULATORY_CARE_PROVIDER_SITE_OTHER): Payer: Medicare Other

## 2016-07-20 ENCOUNTER — Ambulatory Visit
Admission: RE | Admit: 2016-07-20 | Discharge: 2016-07-20 | Disposition: A | Discharge status: Home / Self Care | Payer: Medicare Other | Source: Ambulatory Visit | Attending: Vascular Surgery | Admitting: Vascular Surgery

## 2016-07-20 ENCOUNTER — Encounter: Payer: Self-pay | Admitting: Pulmonary Disease

## 2016-07-20 ENCOUNTER — Ambulatory Visit (INDEPENDENT_AMBULATORY_CARE_PROVIDER_SITE_OTHER): Payer: Medicare Other | Admitting: Pulmonary Disease

## 2016-07-20 VITALS — BP 132/84 | HR 67 | Temp 97.1°F | Ht 69.0 in | Wt 186.0 lb

## 2016-07-20 DIAGNOSIS — J9611 Chronic respiratory failure with hypoxia: Secondary | ICD-10-CM | POA: Diagnosis not present

## 2016-07-20 DIAGNOSIS — Z95828 Presence of other vascular implants and grafts: Secondary | ICD-10-CM | POA: Diagnosis not present

## 2016-07-20 DIAGNOSIS — I714 Abdominal aortic aneurysm, without rupture, unspecified: Secondary | ICD-10-CM

## 2016-07-20 DIAGNOSIS — Z9889 Other specified postprocedural states: Secondary | ICD-10-CM

## 2016-07-20 DIAGNOSIS — I6523 Occlusion and stenosis of bilateral carotid arteries: Secondary | ICD-10-CM

## 2016-07-20 DIAGNOSIS — J84112 Idiopathic pulmonary fibrosis: Secondary | ICD-10-CM

## 2016-07-20 DIAGNOSIS — G3184 Mild cognitive impairment, so stated: Secondary | ICD-10-CM

## 2016-07-20 DIAGNOSIS — I2510 Atherosclerotic heart disease of native coronary artery without angina pectoris: Secondary | ICD-10-CM | POA: Diagnosis not present

## 2016-07-20 LAB — CBC WITH DIFFERENTIAL/PLATELET
BASOS PCT: 0.7 % (ref 0.0–3.0)
Basophils Absolute: 0.1 10*3/uL (ref 0.0–0.1)
EOS PCT: 1.5 % (ref 0.0–5.0)
Eosinophils Absolute: 0.2 10*3/uL (ref 0.0–0.7)
HCT: 38.6 % — ABNORMAL LOW (ref 39.0–52.0)
HEMOGLOBIN: 12.8 g/dL — AB (ref 13.0–17.0)
LYMPHS ABS: 2.8 10*3/uL (ref 0.7–4.0)
Lymphocytes Relative: 23.3 % (ref 12.0–46.0)
MCHC: 33.1 g/dL (ref 30.0–36.0)
MCV: 93.2 fl (ref 78.0–100.0)
MONO ABS: 0.6 10*3/uL (ref 0.1–1.0)
Monocytes Relative: 4.9 % (ref 3.0–12.0)
NEUTROS ABS: 8.4 10*3/uL — AB (ref 1.4–7.7)
Neutrophils Relative %: 69.6 % (ref 43.0–77.0)
Platelets: 213 10*3/uL (ref 150.0–400.0)
RBC: 4.14 Mil/uL — ABNORMAL LOW (ref 4.22–5.81)
RDW: 13.9 % (ref 11.5–15.5)
WBC: 12 10*3/uL — AB (ref 4.0–10.5)

## 2016-07-20 LAB — COMPREHENSIVE METABOLIC PANEL
ALT: 26 U/L (ref 0–53)
AST: 30 U/L (ref 0–37)
Albumin: 3.9 g/dL (ref 3.5–5.2)
Alkaline Phosphatase: 69 U/L (ref 39–117)
BUN: 14 mg/dL (ref 6–23)
CHLORIDE: 102 meq/L (ref 96–112)
CO2: 28 meq/L (ref 19–32)
CREATININE: 1 mg/dL (ref 0.40–1.50)
Calcium: 9.2 mg/dL (ref 8.4–10.5)
GFR: 75.53 mL/min (ref >60.00)
GLUCOSE: 97 mg/dL (ref 70–99)
Potassium: 3.6 mEq/L (ref 3.5–5.1)
Sodium: 139 mEq/L (ref 135–145)
Total Bilirubin: 0.6 mg/dL (ref 0.2–1.2)
Total Protein: 7.7 g/dL (ref 6.0–8.3)

## 2016-07-20 LAB — TSH: TSH: 3.94 u[IU]/mL (ref 0.35–4.50)

## 2016-07-20 MED ORDER — IOPAMIDOL (ISOVUE-370) INJECTION 76%
75.0000 mL | Freq: Once | INTRAVENOUS | Status: AC | PRN
  Administered 2016-07-20: 75 mL via INTRAVENOUS

## 2016-07-20 NOTE — Progress Notes (Signed)
Subjective:     Patient ID: Culver Feighner, male   DOB: 08/07/1929, 83 y.o.   MRN: [id number]  HPI 83y/o WF w/ mult co-morbidities followed for pulm fibrosis (IPF/UIP) and hypoxemic resp insufficiency- on Prednisone and Home O2...  ~  June 24, 2015:  Initial pulmonary consult by SN>  The Britts are friends/ church members with the [name]... 83 y/o WM referred by Dr.[name] for further evaluation & treatment of pulmonary fibrosis>  He recently had a vasc surg follow up visit where they noted crackles in the lung bases and referred him back to [name];  He c/o a slight breathing problem w/ SOB/DOE after mowing the yard for 40 min, climbing hills, playing golf but no real change for years he says (stable- not progressive);  He has noted a slight dry cough x1-28yrs and actually saw ENT for eval post nasal drip/ congestion but no better after sinusitis treatment;  Then he saw Allergist- neg testing, no allergies found;  He has occas beige/yellow sput production, denies hx bronchitis/ recurrent infections, and no f/c/s;  He was seen by [facility] for f/u CDopplers (they remain patent s/p bilat CAEs) and they noted crackles at L>R lung base;  Pt rechecked by [name] w/ similar findings and she rechecked CXR & PFT (see below) => refer to pulmonary...   Smoking Hx>  He is an ex-smoker; started around age 4, smoked for ~14yrs but always <1ppd; Quit smoking 1966 w/ a 10 pack-year smoking hx...  Pulmonary Hx>  He denies any hx asthma, does not recall treatment for bronchits, never had pneumonia, no hx TB or known exposure;  He has been treated for sinus infections.  Medical Hx>  HBP, vasc dis w/ carotid dis (s/p bilat CAEs) & AAA, TIA/ stroke, HL, prostate ca (s/p seeds), DJD/ spondylosis, mild dementia  Family Hx>  There is no family hx of lung disease;  Father was a smoker & had a heart attack & stroke.  Occup Hx>  He was the Psychologist, educational for [employer]- no known exposure to  asbestos, silica dust, other inorganic or organic dusts/ materials...  Current Meds>  Astelin/ Atrovent nasal, Zyrtek, Plavix, Resveratrol, Crestor, Fish Oil, Vits... EXAM shows Afeb, VSS, O2sat=92% on RA;  HEENT- neg, mallampati2;  Chest- bibasilar velcro rales L>R ~1/3rd the way up post chest wall;  Heart- RR w/o m/r/g;  Abd- soft, nontender, neg;  Ext- neg w/o c/c/e...  CT Angio Abd done 01/02/08>  Chronic interstitial changes noted at the lung bases bilat; fusiform infrarenal AAA at 3.5cm, mod ostial dis of celiac axis origin...   Old CXR 08/15/08 showed norm heart size, patchy opac at the left lung base, otherw neg...  Old CXR 04/18/13 showed norm heart size, sl tortuous Ao, diffuse incr in interstitial markings L>R...  Recent CXR 05/21/15>  Heart at upper lim of normal, prominent coarse interstitial markings bilat L>R, no focal abn & unchanged from 2015 film...  Full PFT 05/29/15> FVC=2.83 (73%), FEV1=2.48 (91%), %1sec=87, mid-flows wnl at 155% predicted; TLC=4.97 (70%), RV=2.08 (76%), RV/TLC=42%; DLCO=23% predicted; c/w a mild restrictive ventilatory defect & severe decr DLCO.   Ambulatory Oximetry 06/24/15>  O2sat=100% on RA at rest w/ pulse=59/min;  He ambulated only 2 Laps in office w/ lowest O2sat=86% w/ pulse=91/min... He indicated that he does NOT want oxygen at this point.  LABS 06/24/15>  Chems- wnl w/ Cr=0.96;  CBC- wnl w/ Hg=13.8;  TSH=4.55;  Sed=34;  CRP=0.8;  RF>600;  CCP=<16;  ANA=neg  CXR 04/18/13                                          CXR 05/21/15     IMP >>     Interstitial Lung Disease-- likely IPF and we will proceed w/ His-res CT Chest, & Preliminary LAB screen...    CARDIAC issues>  53 y/o, HBP, vasc dis w/ bilat carotid dis (s/p bilat CAEs) & AAA (4.1cm), TIA/ stroke, HL; followed by [name] & [name]...    MEDICAL issues>  89 y/o, HBP, vasc dis w/ bilat carotid dis (s/p bilat CAEs) & AAA, TIA/ stroke, HL, prostate ca (s/p seeds), DJD/ spondylosis,  mild dementia; followed by [name] & [name]... PLAN >>     We discussed pulmonary fibrosis and the evaluation- they are familiar w/ this dis since a church friend was recently diagnosed and has since passed away; they are understandably concerned and I pointed out some differences in [name] (ie- he has had this on his CXR w/o much change since 2015);  We will proceed w/ Labs and Hi-res CT Chest => pending.  ADDENDUM>> Hi-res CT CHEST done 07/02/15>  Norm heart size, atherosclerosis of Ao/ coronaries including [place], no adenopathy or lung nodules; extensive subpleural reticulation, traction bronchiectasis, & architectural distortion bilaterally w/ basilar predominance, moderate honeycombing => all favors UIP diagnosis... I called pt & wife w/ report & they will think about options and f/u OV 07/11/15 to discuss further...   ~  July 11, 2015:  2wk ROV w/ SN>  As above I have prev discussed his results to date w/ pt & wife & layed out his options for treating his UIP/ IPF => specifically I outlined a 3-prong approach w/ Aggressive/ Middle-of-the-road/ Conservative options;  They have considered each of these approaches and discussed w/ their children => they have chosen the 2nd option for a middle-of-the-road approach, and in this regard they feel that additional collagen-vasc blood work would not add substantially to his treatment options, and they do not wish to pursue antifibrotic therapy w/ Ofev or Esbriet;  We decided to start pt on a low dose of Pred 10mg /d and he is cautioned to elim salt from their diet, & watch caloric intake;  We plan ROV in 6-8 weeks w/ CXR & Labs at that time...   Ambulatory Oximetry 07/11/15>  O2sat=95% on RA at rest w/ pulse=74/min;  He ambulated 3 Laps (185'ea) w/ lowest O2sat=81% w/ pulse=103/min;  He qualifies for Home Oxygen (rec 2L/min w/ exercise) but he declines...  ONO => pending => this was NOT completed. IMP/PLAN>>  Bill needs to start on HomeO2-POC for  oxygen therapy w/ activities but he is reluctant to do so & declines my recommendation to start O2 now; he notes that he is asymptomatic- plays golf 2d/wk, mows yard, walks, etc and denies SOB, wife is not so sure (he is very stoic and has "mild cognitive impairment"); we decided to check the ONO & then proceed w/ Home O2 as indicated...  ~  August 27, 2015:  6wk ROV w/ SN>  Rush Landmark returns or f/u of his IPF/ probUIP after starting o Pred10 last visit, and reports a good interval, as noted he is stoic & has mild cognitive impairment; he is currently taking PREDNISONE 10mg /d & tol well, has Home O2 at 2L/min (doesn't like it & compliance is fair); he continues to golf &  do yard work- he does not use the port O2 despite desat w/ exercise- he just takes his time & uses rest stops...    EXAM shows Afeb, VSS, O2sat=92% on RA;  HEENT- neg, mallampati2;  Chest- bibasilar velcro rales L>R ~1/3rd the way up post chest wall;  Heart- RR w/o m/r/g;  Abd- soft, nontender, neg;  Ext- neg w/o c/c/e...  CXR 08/27/15 (independently reviewed by me in the PACS system) showed norm heart size, tortuous Ao, stable bilat fibrotic changes, NAD...  LABS 08/27/15>  Chems- ok x HCO3=33;  CBC- ok x wbc=12.1K... IMP/PLAN>>  Rush Landmark is stable on the [place] well; we wrote for a handicap placard; discussed asking [employer] company for a POC to encourage using oxygen w/ activities... we plan ROV in 82mo.  ~  December 03, 2015:  35mo ROV & Bill notes that his breathing is doing satis, no real change in DOE etc but he has been coughing more, small amt yellow sput, no hemoptysis, no f/c/s, etc;  In the interval he had a UTI- seen in ER w/ fever & some confusion & treated w/ Keflex;  He saw [name] Harriett Sine for f/u- Hx stroke on Plavix, mild cognitive impairment on Cerefolin, felt to be stable;  He also had allergy f/u DrWhelan- on Zyrtek & Astelin...    EXAM is unchanged- Afeb, VSS, O2sat=93% on RA;  HEENT- neg, mallampati2;  Chest- bibasilar velcro  rales L>R ~1/3rd the way up;  Heart- RR w/o m/r/g;  Abd- soft, nontender, neg;  Ext- neg w/o c/c/e... IMP/PLAN>>  Given 2017 Flu vaccine today, rec to add Delsym- 2tsp Bid for cough...  Pulm fibrosis- likely UIP, Exercise induced hypoxemia> continue Pred10, O2 at 2L/min Qhs & w/ exercise, low sodium low carb diet...  Cardiac issues- 83 y/o, HBP, ASPVD w/ bilat-CAEs (Left 2008, Right 2015), 4cm AAA, TIA/stroke> followed by [name] & [name].  Medical issues- as above + HL, prostate ca (s/p seeds), DJD/spondylosis, MCI/dementia> followed by [name] & [name].  ~  January 22, 2016:  6wk ROV & add-on appt requested for worsening cough>  Rush Landmark presents c/o 3 wk hx cough- "a deep intense cough", intermittently prod of a small amt of yellow sput, some incr SOB noted due to the coughing paroxysms but he denies CP or f/c/s; of note the cough seems worse at night (but not every night);  He has been taking O2 at 2L/min Qhs & prn w/ exercise during the day, Pred10/d, GFN-400 2Bid w/ fluids, Delsym prn;  The episode sounds like an IPF exacerbation & we discussed Rx w/ Depo20, incr Pred to 20mg /d for 2wks then back to 10mg /d regular dose, LEVAQUIN 500mg x7d, Align daily & Hydromet cough syrup prn;  We also discussed an antireflux regimen for the nocturnal cough- Protonix40 before dinner, NPO after dinner in the eve, elev HOB 6"...    EXAM is unchanged- Afeb, VSS, O2sat=91% on RA;  HEENT- neg, mallampati2;  Chest- bibasilar velcro rales L>R ~1/3rd the way up (of note: pt did not cough once during the OV;  Heart- RR w/o m/r/g;  Abd- soft, nontender, neg;  Ext- neg w/o c/c/e... IMP/PLAN>>  This episode may have been triggered by an upper resp infection, nocturnal reflux and LPR, or an IPF exac-- rec treatment as above w/ O2, Depo/Pred bump, Levaquin, Mucinex, Hydromet, plus we initiated a vigorous antireflux protocol w/ PPI before dinner, NPO after dinner, elev HOB 6"... He has a regular sched OV in mid-Jan & we will  recheck  pt at that time...  ~  April 20, 2016:  82mo ROV & pulmonary follow up visit> pt states that he improved after the last visit w/ IPF exac, treated w/ Levaquin & Pred bump, & pt feels that he is back to his baseline; wife is not so sure w/ incr SOB while taking out the trash & simple chores (esp if he tries it w/o his oxygen);  He denies CP, palpit, and feels that his SOB is about the same w/ exertion; mild dry cough esp at night, no sput, no hemoptysis...    Pulm fibrosis- likely UIP, Hypoxemic respiratory failure> on O2 2L/min w/ exercise & Qhs, but encouraged to use it 24/7, and PRED 10mg  Qam; he has Zyrtek & Astelin for sinuses...     Cardiac issues- 83 y/o, HBP, ASPVD w/ bilat-CAEs (Left 2008, Right 2015), 4cm AAA, TIA/stroke> followed by [name] & [name].    Medical issues- as above + HL, prostate ca (s/p seeds), DJD/spondylosis, MCI/dementia> followed by [name] & [name].    EXAM shows Afeb, VSS, O2sat=94% on 3L pulse dose POC;  HEENT- neg, mallampati2;  Chest- bibasilar velcro rales L>R ~1/3rd the way up (of note: pt did not cough once during the OV);  Heart- RR w/o m/r/g;  Abd- soft, nontender, neg;  Ext- neg w/o c/c/e; Neuro- intact x mild memory impairment... IMP/PLAN>>  Rush Landmark is +- stable and says he's doing OK; wife notes incr SOB w/ activities while not using the O2 eg- taking out the trash etc; he has a set routine & he seems to do ok; still drives some & she is asking for guidelines so we discussed not driving if P2RJJ OA<41%; he recovered satis from the prev acute exac (using Levaquin & bump Pred) but new baseline is sl lower; Rec to continue the O2 w/ all activities, & the Pred 10mg /d; he will try to gradually incr exercise & expand his lungs; continue vigorous antireflux regimen which we reviewed today... we plan ROV in 55mo, sooner if needed prn.   ~  Jul 20, 2016:  21mo ROV & Bill was HOSP by [facility]-DrCain 3/29 - 06/18/16 for a 5.9cm AAA w/ Aorto bi-iliac endovasc stent graft; he  did well & has recovered from the surg; they increased his O2 to 3L/min after the surg... We reviewed the following medical problems during today's office visit >>     Pulm fibrosis- likely UIP, Hypoxemic respiratory failure> on O2 3L/min w/ exercise & Qhs, but encouraged to use it 24/7, and PRED 10mg  Qam; he has Zyrtek & Astelin for sinuses...     Cardiac issues- 82 y/o, HBP, ASPVD w/ bilat-CAEs (Left 2008, Right 2015), 4cm AAA, TIA/stroke> followed by Youlanda Mighty; on ASA81/d...    Medical issues- as above + HL (on Cres5), prostate ca (s/p seeds), DJD/spondylosis, MCI/dementia> followed by [name] & [name]=> seen 05/04/16- hx stroke & MCI, signif bruising therefore stopped Plavix, cont ASA; continue Cerefolin & Resveratrol...    EXAM shows Afeb, VSS, O2sat=100% on 3L pulse dose POC;  HEENT- neg, mallampati2;  Chest- bibasilar velcro rales L>R ~1/3rd the way up (of note: pt did not cough once during the OV);  Heart- RR w/o m/r/g;  Abd- soft, nontender, neg;  Ext- neg w/o c/c/e; Neuro- intact x mild memory impairment...  Ambulatory oximetry on 3L/min [place]>  O2sat=93% on 3L/min oxygen at rest w/ pulse=63/min;  He ambulated 2 Laps (185'ea) & O2 nadir at peak exercise was 73% w/ pulse=105/min... Rec to incr O2 to  4-5L transiently after exercise, then back to his baseline 3L/min...  LABS 07/20/16>  Chems- wnl;  CBC- ok w/ Hg=12.8, WBC=12K;  TSH=3.94;   IMP/PLAN>>  Rush Landmark has gone thru major surg w/ Ao/Bi-iliac stent graft;  His IPF has deteriorated & I wonder if her had an exacerbation during this time? Now on 3L/min 7 we have maintained Pred 10mg /d...    Past Medical History:  Diagnosis Date  . AAA (abdominal aortic aneurysm) ([place])   . Allergy   . Carotid artery stenosis   . Diplopia   . GERD (gastroesophageal reflux disease)   . Hyperlipidemia   . Prostate cancer ([place])   . Pulmonary fibrosis ([place])   . Spondylisthesis 2015   Spine  . Stroke ([place]) 04/18/13   some weakness left-returning  strength  . TIA (transient ischemic attack)     Past Surgical History:  Procedure Laterality Date  . ABDOMINAL AORTIC ENDOVASCULAR STENT GRAFT N/A 06/17/2016   Procedure: ABDOMINAL AORTIC ENDOVASCULAR STENT GRAFT;  Surgeon: Waynetta Sandy, MD;  Location: [facility] OR;  Service: Vascular;  Laterality: N/A;  . CAROTID ENDARTERECTOMY  Sept. 2008   Elective left carotid endarterectomy  . CAROTID ENDARTERECTOMY Right 04/27/2013   CE  . ENDARTERECTOMY Right 04/27/2013   Procedure: ENDARTERECTOMY CAROTID;  Surgeon: Mal Misty, MD;  Location: [facility];  Service: Vascular;  Laterality: Right;  . EYE SURGERY     Bilateral cataract   . FOOT SURGERY      left Achilles Tendon Repair  . seed implantation  2001   for prostate ca    Outpatient Encounter Prescriptions as of 07/20/2016  Medication Sig  . acetaminophen (TYLENOL) 500 MG tablet Take 500 mg by mouth every 6 (six) hours as needed for mild pain.  Marland Kitchen aspirin EC 81 MG tablet Take 81 mg by mouth daily.  Marland Kitchen azelastine (ASTELIN) 0.1 % nasal spray Place 2 sprays into both nostrils 2 (two) times daily.   . cetirizine (ZYRTEC) 10 MG tablet Take 10 mg by mouth daily as needed for allergies.   Marland Kitchen dextromethorphan-guaiFENesin (MUCINEX DM) 30-600 MG 12hr tablet Take 1 tablet by mouth 2 (two) times daily.  Marland Kitchen L-Methylfolate-B12-B6-B2 (CEREFOLIN) 08-20-48-5 MG TABS Take 1 tablet by mouth daily.  . Multiple Vitamins-Minerals (CENTRUM SILVER PO) Take 1 tablet by mouth daily.   . naproxen sodium (ANAPROX) 220 MG tablet Take 220 mg by mouth daily as needed (pain).  . Omega-3 Fatty Acids (FISH OIL PO) Take 1 capsule by mouth daily.  . pantoprazole (PROTONIX) 40 MG tablet Take 1 tablet (40 mg total) by mouth daily. 30 minutes before dinner (Patient taking differently: Take 40 mg by mouth daily before supper. 30 minutes before dinner)  . predniSONE (DELTASONE) 10 MG tablet TAKE 1 TABLET EVERY DAY WITH BREAKFAST.  Marland Kitchen Resveratrol 100 MG CAPS Take 100 mg by mouth every  evening.   . rosuvastatin (CRESTOR) 5 MG tablet Take 5 mg by mouth at bedtime.  . [DISCONTINUED] oxyCODONE-acetaminophen (PERCOCET/ROXICET) 5-325 MG tablet Take 1 tablet by mouth every 6 (six) hours as needed. (Patient not taking: Reported on 07/20/2016)   Facility-Administered Encounter Medications as of 07/20/2016  Medication  . aspirin EC tablet 81 mg    Allergies  Allergen Reactions  . Sudafed [Pseudoephedrine Hcl]     Causes dizziness and nervousness  . Atorvastatin Other (See Comments)    Reaction: Patient's family reports "confusion" and would prefer crestor     Immunization History  Administered Date(s) Administered  . Influenza, High  Dose Seasonal PF 12/03/2015  . Influenza-Unspecified 12/21/2014  . Pneumococcal Conjugate-13 02/28/2014  . Pneumococcal Polysaccharide-23 03/23/1999    Current Medications, Allergies, Past Medical History, Past Surgical History, Family History, and Social History were reviewed in [employer] record.   Review of Systems             All symptoms NEG except where BOLDED >>  Constitutional:  F/C/S, fatigue, anorexia, unexpected weight change. HEENT:  HA, visual changes, hearing loss, earache, nasal symptoms, sore throat, mouth sores, hoarseness. Resp:  cough, sputum, hemoptysis; SOB, tightness, wheezing. Cardio:  CP, palpit, DOE, orthopnea, edema. GI:  N/V/D/C, blood in stool; reflux, abd pain, distention, gas. GU:  dysuria, freq, urgency, hematuria, flank pain, voiding difficulty. MS:  joint pain, swelling, tenderness, decr ROM; neck pain, back pain, etc. Neuro:  HA, tremors, seizures, dizziness, syncope, weakness, numbness, gait abn. Skin:  suspicious lesions or skin rash. Heme:  adenopathy, bruising, bleeding. Psyche:  Memory loss, confusion, agitation, sleep disturbance, hallucinations, anxiety, depression, suicidal.   Objective:   Physical Exam       Vital Signs:  Reviewed...   General:  WD, WN, 83 y/o WM in  NAD; alert & oriented; pleasant & cooperative... HEENT:  [place]/AT; Conjunctiva- pink, Sclera- nonicteric, EOM-wnl, PERRLA, EACs-clear, TMs-wnl; NOSE-clear; THROAT-clear & wnl.  Neck:  Supple w/ fair ROM; no JVD; normal carotid impulses w/o bruits; no thyromegaly or nodules palpated; no lymphadenopathy.  Chest:  Bibasilar crackles ~1/3rd way up the back;  No wheezing, rhonchi, signs of consolidation... Heart:  Regular Rhythm; norm S1 & S2 without murmurs, rubs, or gallops detected. Abdomen:  Soft & nontender- no guarding or rebound; normal bowel sounds; no organomegaly or masses palpated. Ext:  Normal ROM; without deformities or arthritic changes; no varicose veins, venous insuffic, or edema;  Pulses intact w/o bruits. Neuro:  No focal neuro deficits; sensory testing normal; gait normal & balance OK. Derm:  No lesions noted; no rash etc. Lymph:  No cervical, supraclavicular, axillary, or inguinal adenopathy palpated.   Assessment:      IMP >>     Interstitial Lung Disease-- UIP/ IPF w/ characteristic Hi-res CT Chest findings and prelim collagen-vasc labs showing elev Rheum Factor>600 & Sed=34, otherw neg Anti-CCP/ ANA/ & CRP    CARDIAC issues>  47 y/o, HBP, vasc dis w/ bilat carotid dis (s/p bilat CAEs) & AAA (4.1cm), TIA/ stroke, HL; followed by [name] & [name]...    MEDICAL issues>  36 y/o, HBP, vasc dis w/ bilat carotid dis (s/p bilat CAEs) & AAA, TIA/ stroke, HL, prostate ca (s/p seeds), DJD/ spondylosis, mild dementia; followed by [name] & [name]...  PLAN >>  06/24/15>   We discussed pulmonary fibrosis and the evaluation- they are familiar w/ this dis since a church friend was recently diagnosed and has since passed away; they are understandably concerned and I pointed out some differences in [place] presentation (ie- he has had this on his CXR w/o much change since 2015);  We will proceed w/ Labs and Hi-res CT Chest... 07/11/15>   Pt & family have opted for a middle-of-the-road approach  and declined further testing & Antifibrotic therapy; he appears to need O2 rx w/ activities now but is reluctant to accept this (we will check ONO & make final recommendation);  Plus we will give him a trial of Pred 10mg /d... 08/27/15>   Bill is stable on the [place] well; we wrote for a handicap placard; discussed asking [employer] company for a  POC to encourage using oxygen w/ activities. 12/03/15>   Stable IPF/UIP on Pred10 + home O2, continue same; given 2017 Flu vaccine today, rec to add Delsym- 2tsp Bid for cough... 02/10/16>   This episode may have been triggered by an upper resp infection, nocturnal reflux and LPR, or an IPF exac-- rec treatment as above w/ O2, Depo/Pred bump, Levaquin, Mucinex, Hydromet, plus we initiated a vigorous antireflux protocol w/ PPI before dinner, NPO after dinner, elev HOB 6". 07/20/16>   Bill has gone thru major surg w/ Ao/Bi-iliac stent graft;  His IPF has deteriorated & I wonder if her had an exacerbation during this time? Now on 3L/min 7 we have maintained Pred 10mg /d..     Plan:     CONTINUE HOME OXYGEN at 3L/min regularly...  Patient's Medications  New Prescriptions   No medications on file  Previous Medications   ACETAMINOPHEN (TYLENOL) 500 MG TABLET    Take 500 mg by mouth every 6 (six) hours as needed for mild pain.   ASPIRIN EC 81 MG TABLET    Take 81 mg by mouth daily.   AZELASTINE (ASTELIN) 0.1 % NASAL SPRAY    Place 2 sprays into both nostrils 2 (two) times daily.    CETIRIZINE (ZYRTEC) 10 MG TABLET    Take 10 mg by mouth daily as needed for allergies.    DEXTROMETHORPHAN-GUAIFENESIN (MUCINEX DM) 30-600 MG 12HR TABLET    Take 1 tablet by mouth 2 (two) times daily.   L-METHYLFOLATE-B12-B6-B2 (CEREFOLIN) 08-20-48-5 MG TABS    Take 1 tablet by mouth daily.   MULTIPLE VITAMINS-MINERALS (CENTRUM SILVER PO)    Take 1 tablet by mouth daily.    NAPROXEN SODIUM (ANAPROX) 220 MG TABLET    Take 220 mg by mouth daily as needed (pain).   OMEGA-3 FATTY ACIDS  (FISH OIL PO)    Take 1 capsule by mouth daily.   PANTOPRAZOLE (PROTONIX) 40 MG TABLET    Take 1 tablet (40 mg total) by mouth daily. 30 minutes before dinner   PREDNISONE (DELTASONE) 10 MG TABLET    TAKE 1 TABLET EVERY DAY WITH BREAKFAST.   RESVERATROL 100 MG CAPS    Take 100 mg by mouth every evening.    ROSUVASTATIN (CRESTOR) 5 MG TABLET    Take 5 mg by mouth at bedtime.  Modified Medications   No medications on file  Discontinued Medications   OXYCODONE-ACETAMINOPHEN (PERCOCET/ROXICET) 5-325 MG TABLET    Take 1 tablet by mouth every 6 (six) hours as needed.

## 2016-07-20 NOTE — Patient Instructions (Signed)
Today we updated your med list in our EPIC system...    Continue your current medications the same...  Today we checked your follow up labs and an ambulatory oximetry test>     On 3L/min by nasal cannula your Oxygenation was good until peak exercise where it dropped to low 80s (heart rate around 100)...    You need to increase your exercise at home in a gradual exercise program as we discussed...    We will contact you w/ the results of the blood work when available...   Call for any questions...  Let's plan a follow up visit in 75mo, sooner if needed for problems.Marland KitchenMarland Kitchen

## 2016-07-21 ENCOUNTER — Telehealth: Payer: Self-pay | Admitting: Pulmonary Disease

## 2016-07-21 DIAGNOSIS — J300 Vasomotor rhinitis: Secondary | ICD-10-CM | POA: Diagnosis not present

## 2016-07-21 NOTE — Telephone Encounter (Signed)
Spoke with pt's wife and informed her of results per SN. She understood and had no further questions. Nothing further is needed.   Notes recorded by Noralee Space, MD on 07/21/2016 at 9:21 AM EDT Please notify patient & wife> LABS are OK. Chems- wnl w/ norm BS, electrolytes, renal function, LFTs etc... CBC is improved w/ Hg=12.8 (up from 11.1)... Thyroid is wnl.Marland KitchenMarland Kitchen

## 2016-07-23 ENCOUNTER — Encounter: Payer: Medicare Other | Admitting: Vascular Surgery

## 2016-07-23 ENCOUNTER — Ambulatory Visit (INDEPENDENT_AMBULATORY_CARE_PROVIDER_SITE_OTHER): Payer: Self-pay | Admitting: Vascular Surgery

## 2016-07-23 ENCOUNTER — Encounter: Payer: Self-pay | Admitting: Vascular Surgery

## 2016-07-23 VITALS — BP 148/83 | HR 70 | Temp 97.3°F | Resp 20 | Ht 69.0 in | Wt 186.0 lb

## 2016-07-23 DIAGNOSIS — I714 Abdominal aortic aneurysm, without rupture, unspecified: Secondary | ICD-10-CM

## 2016-07-23 NOTE — Progress Notes (Signed)
Subjective:     Patient ID: Makari Portman, male   DOB: Apr 25, 1929, 83 y.o.   MRN: [id number]  HPI 83 year old male follows up from recent [place]. He has done very well from the surgery although has required increasing oxygen at home. He does not have any chest pain abdominal pain and is walking without limitation except from his lung standpoint. He is eager to play golf again.   Review of Systems Shortness of breath with activity, oxygen at home.    Objective:   Physical Exam aaox3  on home O2 Abdomen is soft  palpable pedal pulses Groins are soft without mass  IMPRESSION: VASCULAR  Endovascular repair of the abdominal aortic aneurysm. The aortic stent graft is patent. The aneurysm sac is stable in size measuring 5.9 cm. Evidence for small type 2 endoleaks. There is a small endoleak in the distal abdominal aorta related to a left lumbar artery and there appears to be outflow into the accessory inferior right renal artery. There is also a possible small type 2 endoleak involving the IMA. Recommend attention to these areas on follow up imaging.     Assessment/plan     83 year old male follows up from recent endovascular repair of his aneurysm. There is evidence of type II endoleak's on his CT scan. He is otherwise doing well. I spent significant time counseling the family on the natural course of these endoleak's which will likely be no intervention. We will get a repeat CTA in 6 months and go from there. They were counseled on signs and symptoms of aortic aneurysm rupture the demonstrated good understanding.     Shi Blankenship C. Donzetta Matters, MD [facility] Office: [phone] Pager: [phone]

## 2016-07-26 NOTE — Progress Notes (Signed)
Contacted patient and wife (listed on DPR) was informed of results. She did not have any questions and verbalized understanding. Nothing further is needed.

## 2016-07-27 DIAGNOSIS — M5416 Radiculopathy, lumbar region: Secondary | ICD-10-CM | POA: Diagnosis not present

## 2016-07-27 NOTE — Addendum Note (Signed)
Addended by: Lianne Cure A on: 07/27/2016 09:18 AM   Modules accepted: Orders

## 2016-08-09 ENCOUNTER — Encounter: Payer: Self-pay | Admitting: Cardiovascular Disease

## 2016-08-12 DIAGNOSIS — H52223 Regular astigmatism, bilateral: Secondary | ICD-10-CM | POA: Diagnosis not present

## 2016-08-16 DIAGNOSIS — J84112 Idiopathic pulmonary fibrosis: Secondary | ICD-10-CM | POA: Diagnosis not present

## 2016-08-16 DIAGNOSIS — I2510 Atherosclerotic heart disease of native coronary artery without angina pectoris: Secondary | ICD-10-CM | POA: Diagnosis not present

## 2016-08-17 DIAGNOSIS — M533 Sacrococcygeal disorders, not elsewhere classified: Secondary | ICD-10-CM | POA: Diagnosis not present

## 2016-08-30 DIAGNOSIS — M533 Sacrococcygeal disorders, not elsewhere classified: Secondary | ICD-10-CM | POA: Diagnosis not present

## 2016-09-05 ENCOUNTER — Other Ambulatory Visit: Payer: Self-pay | Admitting: Pulmonary Disease

## 2016-09-16 DIAGNOSIS — I2510 Atherosclerotic heart disease of native coronary artery without angina pectoris: Secondary | ICD-10-CM | POA: Diagnosis not present

## 2016-09-16 DIAGNOSIS — J84112 Idiopathic pulmonary fibrosis: Secondary | ICD-10-CM | POA: Diagnosis not present

## 2016-09-29 DIAGNOSIS — J8410 Pulmonary fibrosis, unspecified: Secondary | ICD-10-CM | POA: Diagnosis not present

## 2016-09-29 DIAGNOSIS — R0902 Hypoxemia: Secondary | ICD-10-CM | POA: Diagnosis not present

## 2016-09-29 DIAGNOSIS — E782 Mixed hyperlipidemia: Secondary | ICD-10-CM | POA: Diagnosis not present

## 2016-09-29 DIAGNOSIS — I10 Essential (primary) hypertension: Secondary | ICD-10-CM | POA: Diagnosis not present

## 2016-09-30 ENCOUNTER — Ambulatory Visit: Payer: Medicare Other | Admitting: Cardiovascular Disease

## 2016-10-16 DIAGNOSIS — J84112 Idiopathic pulmonary fibrosis: Secondary | ICD-10-CM | POA: Diagnosis not present

## 2016-10-16 DIAGNOSIS — I2510 Atherosclerotic heart disease of native coronary artery without angina pectoris: Secondary | ICD-10-CM | POA: Diagnosis not present

## 2016-10-21 ENCOUNTER — Ambulatory Visit (INDEPENDENT_AMBULATORY_CARE_PROVIDER_SITE_OTHER): Payer: Medicare Other | Admitting: Pulmonary Disease

## 2016-10-21 ENCOUNTER — Encounter: Payer: Self-pay | Admitting: Pulmonary Disease

## 2016-10-21 ENCOUNTER — Ambulatory Visit (INDEPENDENT_AMBULATORY_CARE_PROVIDER_SITE_OTHER)
Admission: RE | Admit: 2016-10-21 | Discharge: 2016-10-21 | Disposition: A | Discharge status: Home / Self Care | Payer: Medicare Other | Source: Ambulatory Visit | Attending: Pulmonary Disease | Admitting: Pulmonary Disease

## 2016-10-21 VITALS — BP 118/62 | HR 75 | Temp 97.5°F | Ht 69.0 in | Wt 182.1 lb

## 2016-10-21 DIAGNOSIS — Z9889 Other specified postprocedural states: Secondary | ICD-10-CM | POA: Diagnosis not present

## 2016-10-21 DIAGNOSIS — J9611 Chronic respiratory failure with hypoxia: Secondary | ICD-10-CM | POA: Diagnosis not present

## 2016-10-21 DIAGNOSIS — J84112 Idiopathic pulmonary fibrosis: Secondary | ICD-10-CM

## 2016-10-21 DIAGNOSIS — G3184 Mild cognitive impairment, so stated: Secondary | ICD-10-CM

## 2016-10-21 DIAGNOSIS — I714 Abdominal aortic aneurysm, without rupture, unspecified: Secondary | ICD-10-CM

## 2016-10-21 DIAGNOSIS — I6523 Occlusion and stenosis of bilateral carotid arteries: Secondary | ICD-10-CM

## 2016-10-21 DIAGNOSIS — Z95828 Presence of other vascular implants and grafts: Secondary | ICD-10-CM | POA: Diagnosis not present

## 2016-10-21 DIAGNOSIS — I2510 Atherosclerotic heart disease of native coronary artery without angina pectoris: Secondary | ICD-10-CM

## 2016-10-21 DIAGNOSIS — J8410 Pulmonary fibrosis, unspecified: Secondary | ICD-10-CM | POA: Diagnosis not present

## 2016-10-21 NOTE — Patient Instructions (Signed)
Today we updated your med list in our EPIC system...    Continue your current medications the same...  Today we checked a follow up CXR...    We will contact you w/ the results when available...   Keep up the good work w/ your diet (just stay away from SALT/ sodium) and exercise...  Call for any questions...  Let's plan a follow up visit in 73mo, sooner if needed for problems.Marland KitchenMarland Kitchen

## 2016-10-21 NOTE — Progress Notes (Signed)
Subjective:     Patient ID: [name] [name], male   DOB: [birth year], 83 y.o.   MRN: [id number]  HPI 83y/o WF w/ mult co-morbidities followed for pulm fibrosis (IPF/UIP) and hypoxemic resp insufficiency- on Prednisone and Home O2...  ~  June 24, 2015:  Initial pulmonary consult by SN>  The Britts are friends/ church members with the [name]... 83 y/o WM referred by Dr.[name] for further evaluation & treatment of pulmonary fibrosis>  He recently had a vasc surg follow up visit where they noted crackles in the lung bases and referred him back to [name];  He c/o a slight breathing problem w/ SOB/DOE after mowing the yard for 40 min, climbing hills, playing golf but no real change for years he says (stable- not progressive);  He has noted a slight dry cough x1-45yrs and actually saw ENT for eval post nasal drip/ congestion but no better after sinusitis treatment;  Then he saw Allergist- neg testing, no allergies found;  He has occas beige/yellow sput production, denies hx bronchitis/ recurrent infections, and no f/c/s;  He was seen by [facility] for f/u CDopplers (they remain patent s/p bilat CAEs) and they noted crackles at L>R lung base;  Pt rechecked by [name] w/ similar findings and she rechecked CXR & PFT (see below) => refer to pulmonary...   Smoking Hx>  He is an ex-smoker; started around age 6, smoked for ~50yrs but always <1ppd; Quit smoking 1966 w/ a 10 pack-year smoking hx...  Pulmonary Hx>  He denies any hx asthma, does not recall treatment for bronchits, never had pneumonia, no hx TB or known exposure;  He has been treated for sinus infections.  Medical Hx>  HBP, vasc dis w/ carotid dis (s/p bilat CAEs) & AAA, TIA/ stroke, HL, prostate ca (s/p seeds), DJD/ spondylosis, mild dementia  Family Hx>  There is no family hx of lung disease;  Father was a smoker & had a heart attack & stroke.  Occup Hx>  He was the Psychologist, educational for [employer]- no known exposure to  asbestos, silica dust, other inorganic or organic dusts/ materials...  Current Meds>  Astelin/ Atrovent nasal, Zyrtek, Plavix, Resveratrol, Crestor, Fish Oil, Vits... EXAM shows Afeb, VSS, O2sat=92% on RA;  HEENT- neg, mallampati2;  Chest- bibasilar velcro rales L>R ~1/3rd the way up post chest wall;  Heart- RR w/o m/r/g;  Abd- soft, nontender, neg;  Ext- neg w/o c/c/e...  CT Angio Abd done 01/02/08>  Chronic interstitial changes noted at the lung bases bilat; fusiform infrarenal AAA at 3.5cm, mod ostial dis of celiac axis origin...   Old CXR 08/15/08 showed norm heart size, patchy opac at the left lung base, otherw neg...  Old CXR 04/18/13 showed norm heart size, sl tortuous Ao, diffuse incr in interstitial markings L>R...  Recent CXR 05/21/15>  Heart at upper lim of normal, prominent coarse interstitial markings bilat L>R, no focal abn & unchanged from 2015 film...  Full PFT 05/29/15> FVC=2.83 (73%), FEV1=2.48 (91%), %1sec=87, mid-flows wnl at 155% predicted; TLC=4.97 (70%), RV=2.08 (76%), RV/TLC=42%; DLCO=23% predicted; c/w a mild restrictive ventilatory defect & severe decr DLCO.   Ambulatory Oximetry 06/24/15>  O2sat=100% on RA at rest w/ pulse=59/min;  He ambulated only 2 Laps in office w/ lowest O2sat=86% w/ pulse=91/min... He indicated that he does NOT want oxygen at this point.  LABS 06/24/15>  Chems- wnl w/ Cr=0.96;  CBC- wnl w/ Hg=13.8;  TSH=4.55;  Sed=34;  CRP=0.8;  RF>600;  CCP=<16;  ANA=neg  CXR 04/18/13                                          CXR 05/21/15     IMP >>     Interstitial Lung Disease-- likely IPF and we will proceed w/ His-res CT Chest, & Preliminary LAB screen...    CARDIAC issues>  53 y/o, HBP, vasc dis w/ bilat carotid dis (s/p bilat CAEs) & AAA (4.1cm), TIA/ stroke, HL; followed by [name] & [name]...    MEDICAL issues>  89 y/o, HBP, vasc dis w/ bilat carotid dis (s/p bilat CAEs) & AAA, TIA/ stroke, HL, prostate ca (s/p seeds), DJD/ spondylosis,  mild dementia; followed by [name] & [name]... PLAN >>     We discussed pulmonary fibrosis and the evaluation- they are familiar w/ this dis since a church friend was recently diagnosed and has since passed away; they are understandably concerned and I pointed out some differences in [name] (ie- he has had this on his CXR w/o much change since 2015);  We will proceed w/ Labs and Hi-res CT Chest => pending.  ADDENDUM>> Hi-res CT CHEST done 07/02/15>  Norm heart size, atherosclerosis of Ao/ coronaries including [place], no adenopathy or lung nodules; extensive subpleural reticulation, traction bronchiectasis, & architectural distortion bilaterally w/ basilar predominance, moderate honeycombing => all favors UIP diagnosis... I called pt & wife w/ report & they will think about options and f/u OV 07/11/15 to discuss further...   ~  July 11, 2015:  2wk ROV w/ SN>  As above I have prev discussed his results to date w/ pt & wife & layed out his options for treating his UIP/ IPF => specifically I outlined a 3-prong approach w/ Aggressive/ Middle-of-the-road/ Conservative options;  They have considered each of these approaches and discussed w/ their children => they have chosen the 2nd option for a middle-of-the-road approach, and in this regard they feel that additional collagen-vasc blood work would not add substantially to his treatment options, and they do not wish to pursue antifibrotic therapy w/ Ofev or Esbriet;  We decided to start pt on a low dose of Pred 10mg /d and he is cautioned to elim salt from their diet, & watch caloric intake;  We plan ROV in 6-8 weeks w/ CXR & Labs at that time...   Ambulatory Oximetry 07/11/15>  O2sat=95% on RA at rest w/ pulse=74/min;  He ambulated 3 Laps (185'ea) w/ lowest O2sat=81% w/ pulse=103/min;  He qualifies for Home Oxygen (rec 2L/min w/ exercise) but he declines...  ONO => pending => this was NOT completed. IMP/PLAN>>  Bill needs to start on HomeO2-POC for  oxygen therapy w/ activities but he is reluctant to do so & declines my recommendation to start O2 now; he notes that he is asymptomatic- plays golf 2d/wk, mows yard, walks, etc and denies SOB, wife is not so sure (he is very stoic and has "mild cognitive impairment"); we decided to check the ONO & then proceed w/ Home O2 as indicated...  ~  August 27, 2015:  6wk ROV w/ SN>  Rush Landmark returns or f/u of his IPF/ probUIP after starting o Pred10 last visit, and reports a good interval, as noted he is stoic & has mild cognitive impairment; he is currently taking PREDNISONE 10mg /d & tol well, has Home O2 at 2L/min (doesn't like it & compliance is fair); he continues to golf &  do yard work- he does not use the port O2 despite desat w/ exercise- he just takes his time & uses rest stops...    EXAM shows Afeb, VSS, O2sat=92% on RA;  HEENT- neg, mallampati2;  Chest- bibasilar velcro rales L>R ~1/3rd the way up post chest wall;  Heart- RR w/o m/r/g;  Abd- soft, nontender, neg;  Ext- neg w/o c/c/e...  CXR 08/27/15 (independently reviewed by me in the PACS system) showed norm heart size, tortuous Ao, stable bilat fibrotic changes, NAD...  LABS 08/27/15>  Chems- ok x HCO3=33;  CBC- ok x wbc=12.1K... IMP/PLAN>>  Rush Landmark is stable on the [place] well; we wrote for a handicap placard; discussed asking [employer] company for a POC to encourage using oxygen w/ activities... we plan ROV in 82mo.  ~  December 03, 2015:  35mo ROV & Bill notes that his breathing is doing satis, no real change in DOE etc but he has been coughing more, small amt yellow sput, no hemoptysis, no f/c/s, etc;  In the interval he had a UTI- seen in ER w/ fever & some confusion & treated w/ Keflex;  He saw [name] Harriett Sine for f/u- Hx stroke on Plavix, mild cognitive impairment on Cerefolin, felt to be stable;  He also had allergy f/u DrWhelan- on Zyrtek & Astelin...    EXAM is unchanged- Afeb, VSS, O2sat=93% on RA;  HEENT- neg, mallampati2;  Chest- bibasilar velcro  rales L>R ~1/3rd the way up;  Heart- RR w/o m/r/g;  Abd- soft, nontender, neg;  Ext- neg w/o c/c/e... IMP/PLAN>>  Given 2017 Flu vaccine today, rec to add Delsym- 2tsp Bid for cough...  Pulm fibrosis- likely UIP, Exercise induced hypoxemia> continue Pred10, O2 at 2L/min Qhs & w/ exercise, low sodium low carb diet...  Cardiac issues- 83 y/o, HBP, ASPVD w/ bilat-CAEs (Left 2008, Right 2015), 4cm AAA, TIA/stroke> followed by [name] & [name].  Medical issues- as above + HL, prostate ca (s/p seeds), DJD/spondylosis, MCI/dementia> followed by [name] & [name].  ~  January 22, 2016:  6wk ROV & add-on appt requested for worsening cough>  Rush Landmark presents c/o 3 wk hx cough- "a deep intense cough", intermittently prod of a small amt of yellow sput, some incr SOB noted due to the coughing paroxysms but he denies CP or f/c/s; of note the cough seems worse at night (but not every night);  He has been taking O2 at 2L/min Qhs & prn w/ exercise during the day, Pred10/d, GFN-400 2Bid w/ fluids, Delsym prn;  The episode sounds like an IPF exacerbation & we discussed Rx w/ Depo20, incr Pred to 20mg /d for 2wks then back to 10mg /d regular dose, LEVAQUIN 500mg x7d, Align daily & Hydromet cough syrup prn;  We also discussed an antireflux regimen for the nocturnal cough- Protonix40 before dinner, NPO after dinner in the eve, elev HOB 6"...    EXAM is unchanged- Afeb, VSS, O2sat=91% on RA;  HEENT- neg, mallampati2;  Chest- bibasilar velcro rales L>R ~1/3rd the way up (of note: pt did not cough once during the OV;  Heart- RR w/o m/r/g;  Abd- soft, nontender, neg;  Ext- neg w/o c/c/e... IMP/PLAN>>  This episode may have been triggered by an upper resp infection, nocturnal reflux and LPR, or an IPF exac-- rec treatment as above w/ O2, Depo/Pred bump, Levaquin, Mucinex, Hydromet, plus we initiated a vigorous antireflux protocol w/ PPI before dinner, NPO after dinner, elev HOB 6"... He has a regular sched OV in mid-Jan & we will  recheck  pt at that time...  ~  April 20, 2016:  40mo ROV & pulmonary follow up visit> pt states that he improved after the last visit w/ IPF exac, treated w/ Levaquin & Pred bump, & pt feels that he is back to his baseline; wife is not so sure w/ incr SOB while taking out the trash & simple chores (esp if he tries it w/o his oxygen);  He denies CP, palpit, and feels that his SOB is about the same w/ exertion; mild dry cough esp at night, no sput, no hemoptysis...    Pulm fibrosis- likely UIP, Hypoxemic respiratory failure> on O2 2L/min w/ exercise & Qhs, but encouraged to use it 24/7, and PRED 10mg  Qam; he has Zyrtek & Astelin for sinuses...     Cardiac issues- 83 y/o, HBP, ASPVD w/ bilat-CAEs (Left 2008, Right 2015), 4cm AAA, TIA/stroke> followed by [name] & [name].    Medical issues- as above + HL, prostate ca (s/p seeds), DJD/spondylosis, MCI/dementia> followed by [name] & [name].    EXAM shows Afeb, VSS, O2sat=94% on 3L pulse dose POC;  HEENT- neg, mallampati2;  Chest- bibasilar velcro rales L>R ~1/3rd the way up (of note: pt did not cough once during the OV);  Heart- RR w/o m/r/g;  Abd- soft, nontender, neg;  Ext- neg w/o c/c/e; Neuro- intact x mild memory impairment... IMP/PLAN>>  Rush Landmark is +- stable and says he's doing OK; wife notes incr SOB w/ activities while not using the O2 eg- taking out the trash etc; he has a set routine & he seems to do ok; still drives some & she is asking for guidelines so we discussed not driving if S2GBT DV<76%; he recovered satis from the prev acute exac (using Levaquin & bump Pred) but new baseline is sl lower; Rec to continue the O2 w/ all activities, & the Pred 10mg /d; he will try to gradually incr exercise & expand his lungs; continue vigorous antireflux regimen which we reviewed today... we plan ROV in 14mo, sooner if needed prn.  ~  Jul 20, 2016:  21mo ROV & Bill was HOSP by [facility]-DrCain 3/29 - 06/18/16 for a 5.9cm AAA w/ Aorto bi-iliac endovasc stent graft; he  did well & has recovered from the surg; they increased his O2 to 3L/min after the surg... We reviewed the following medical problems during today's office visit >>     Pulm fibrosis- likely UIP, Hypoxemic respiratory failure> on O2 3L/min w/ exercise & Qhs, but encouraged to use it 24/7, and PRED 10mg  Qam; he has Zyrtek & Astelin for sinuses...     Cardiac issues- 83 y/o, HBP, Cardiomyopathy w/ EF=45-50% & diffuse HK + Gr2DD (2DEcho 1/15), ASPVD w/ bilat-CAEs (Left 2008, Right 2015), 4cm AAA, TIA/stroke> followed by Youlanda Mighty; on ASA81/d...    Medical issues- as above + HL (on Cres5), prostate ca (s/p seeds), DJD/spondylosis, MCI/dementia> followed by [name] & [name]=> seen 05/04/16- hx stroke & MCI, signif bruising therefore stopped Plavix, cont ASA; continue Cerefolin & Resveratrol...    EXAM shows Afeb, VSS, O2sat=100% on 3L pulse dose POC;  HEENT- neg, mallampati2;  Chest- bibasilar velcro rales L>R ~1/3rd the way up (of note: pt did not cough once during the OV);  Heart- RR w/o m/r/g;  Abd- soft, nontender, neg;  Ext- neg w/o c/c/e; Neuro- intact x mild memory impairment...  Ambulatory oximetry on 3L/min [place]>  O2sat=93% on 3L/min oxygen at rest w/ pulse=63/min;  He ambulated 2 Laps (185'ea) & O2 nadir at peak exercise  was 73% w/ pulse=105/min... Rec to incr O2 to 4-5L transiently after exercise, then back to his baseline 3L/min...  LABS 07/20/16>  Chems- wnl;  CBC- ok w/ Hg=12.8, WBC=12K;  TSH=3.94;   IMP/PLAN>>  Rush Landmark has gone thru major surg w/ Ao/Bi-iliac stent graft;  His IPF has deteriorated & I wonder if her had an exacerbation during this time? Now on 3L/min & we have maintained Pred 10mg /d...   ~  October 21, 2016:  19mo ROV & pulm follow up> Rush Landmark doesn't feel that the 3/18 surg caused any set-back but wife thinks that he is sl worse;  Pt states that O2 sats are "good" in the 90's on 3L/min pulse-dose; he denies SOB! But has difficulty taking out the trash cans, says he golfs some w/  O2 in the cart & uses it prn;  He notes sl cough, min beige sout, no hemoptysis, no f/c/s, no CP, etc...     He saw VascSurg- DrCain 07/23/16>  Hx bilat CAE's and endovasc repair of AAA (3/18- Aorto Bi-iliac endovascular stent graft);  CT Angio Abd & Pelvis 07/20/16 reviewed- patent aortic stent graft, smal endoleak visualized & they are following, Scan also shows severe anterolisthesisat L5-S1, extensive diverticulosis, interstitial fibrosis at lung bases...    Pulm fibrosis- likely UIP, Hypoxemic respiratory failure> on O2 3L/min w/ exercise & Qhs, but encouraged to use it 24/7, and PRED 10mg  Qam; he has Zyrtek & Astelin for sinuses...     Cardiac issues- 83 y/o, HBP, Cardiomyopathy w/ EF=45-50% & diffuse HK + Gr2DD (2DEcho 1/15), ASPVD w/ bilat-CAEs (Left 2008, Right 2015), 4cm AAA, TIA/stroke> followed by Youlanda Mighty; on ASA81/d...    Medical issues- as above + HL (on Cres5), prostate ca (s/p seeds), DJD/spondylosis, MCI/dementia> followed by [name] & [name]=> seen 05/04/16- hx stroke & MCI, signif bruising therefore stopped Plavix, cont ASA; continue Cerefolin & Resveratrol...    EXAM shows Afeb, VSS, O2sat=94% on 3L pulse dose POC;  HEENT- neg, mallampati2;  Chest- bibasilar velcro rales L>R ~1/2 the way up (of note: pt did not cough once during the OV);  Heart- RR w/o m/r/g;  Abd- soft, nontender, neg;  Ext- neg w/o c/c/e; Neuro- intact x mild memory impairment...  CXR 10/21/16 (independently reviewed by me in the PACS system) showed stable cardiomeg, diffuse bilat interstitial prom & elev left hemidiaph, Tspine scoliosis, no acute changes...  IMP/PLAN>>  Rush Landmark is stable w/ his severe disease- pulm, cardiovasc, etc;  Continue same meds & we plan routine recheck 93mo...    Past Medical History:  Diagnosis Date  . AAA (abdominal aortic aneurysm) ([place])   . Allergy   . Carotid artery stenosis   . Diplopia   . GERD (gastroesophageal reflux disease)   . Hyperlipidemia   . Prostate cancer  ([place])   . Pulmonary fibrosis ([place])   . Spondylisthesis 2015   Spine  . Stroke ([place]) 04/18/13   some weakness left-returning strength  . TIA (transient ischemic attack)     Past Surgical History:  Procedure Laterality Date  . ABDOMINAL AORTIC ENDOVASCULAR STENT GRAFT N/A 06/17/2016   Procedure: ABDOMINAL AORTIC ENDOVASCULAR STENT GRAFT;  Surgeon: Waynetta Sandy, MD;  Location: [facility] OR;  Service: Vascular;  Laterality: N/A;  . CAROTID ENDARTERECTOMY  Sept. 2008   Elective left carotid endarterectomy  . CAROTID ENDARTERECTOMY Right 04/27/2013   CE  . ENDARTERECTOMY Right 04/27/2013   Procedure: ENDARTERECTOMY CAROTID;  Surgeon: Mal Misty, MD;  Location: [facility];  Service: Vascular;  Laterality: Right;  . EYE SURGERY     Bilateral cataract   . FOOT SURGERY      left Achilles Tendon Repair  . seed implantation  2001   for prostate ca    Outpatient Encounter Prescriptions as of 10/21/2016  Medication Sig  . acetaminophen (TYLENOL) 500 MG tablet Take 500 mg by mouth every 6 (six) hours as needed for mild pain.  Marland Kitchen aspirin EC 81 MG tablet Take 81 mg by mouth daily.  Marland Kitchen azelastine (ASTELIN) 0.1 % nasal spray Place 2 sprays into both nostrils 2 (two) times daily.   . cetirizine (ZYRTEC) 10 MG tablet Take 10 mg by mouth daily as needed for allergies.   Marland Kitchen dextromethorphan-guaiFENesin (MUCINEX DM) 30-600 MG 12hr tablet Take 1 tablet by mouth 2 (two) times daily.  Marland Kitchen L-Methylfolate-B12-B6-B2 (CEREFOLIN) 08-20-48-5 MG TABS Take 1 tablet by mouth daily.  . Multiple Vitamins-Minerals (CENTRUM SILVER PO) Take 1 tablet by mouth daily.   . naproxen sodium (ANAPROX) 220 MG tablet Take 220 mg by mouth daily as needed (pain).  . Omega-3 Fatty Acids (FISH OIL PO) Take 1 capsule by mouth daily.  . pantoprazole (PROTONIX) 40 MG tablet Take 1 tablet (40 mg total) by mouth daily. 30 minutes before dinner (Patient taking differently: Take 40 mg by mouth daily before supper. 30 minutes before dinner)  .  predniSONE (DELTASONE) 10 MG tablet TAKE 1 TABLET EVERY DAY WITH BREAKFAST.  Marland Kitchen Resveratrol 100 MG CAPS Take 100 mg by mouth every evening.   . rosuvastatin (CRESTOR) 5 MG tablet Take 5 mg by mouth at bedtime.   Facility-Administered Encounter Medications as of 10/21/2016  Medication  . aspirin EC tablet 81 mg    Allergies  Allergen Reactions  . Sudafed [Pseudoephedrine Hcl]     Causes dizziness and nervousness  . Atorvastatin Other (See Comments)    Reaction: Patient's family reports "confusion" and would prefer crestor     Immunization History  Administered Date(s) Administered  . Influenza, High Dose Seasonal PF 12/03/2015  . Influenza-Unspecified 12/21/2014  . Pneumococcal Conjugate-13 02/28/2014  . Pneumococcal Polysaccharide-23 03/23/1999    Current Medications, Allergies, Past Medical History, Past Surgical History, Family History, and Social History were reviewed in [employer] record.   Review of Systems             All symptoms NEG except where BOLDED >>  Constitutional:  F/C/S, fatigue, anorexia, unexpected weight change. HEENT:  HA, visual changes, hearing loss, earache, nasal symptoms, sore throat, mouth sores, hoarseness. Resp:  cough, sputum, hemoptysis; SOB, tightness, wheezing. Cardio:  CP, palpit, DOE, orthopnea, edema. GI:  N/V/D/C, blood in stool; reflux, abd pain, distention, gas. GU:  dysuria, freq, urgency, hematuria, flank pain, voiding difficulty. MS:  joint pain, swelling, tenderness, decr ROM; neck pain, back pain, etc. Neuro:  HA, tremors, seizures, dizziness, syncope, weakness, numbness, gait abn. Skin:  suspicious lesions or skin rash. Heme:  adenopathy, bruising, bleeding. Psyche:  Memory loss, confusion, agitation, sleep disturbance, hallucinations, anxiety, depression, suicidal.   Objective:   Physical Exam       Vital Signs:  Reviewed...   General:  WD, WN, 83 y/o WM in NAD; alert & oriented; pleasant &  cooperative... HEENT:  [place]/AT; Conjunctiva- pink, Sclera- nonicteric, EOM-wnl, PERRLA, EACs-clear, TMs-wnl; NOSE-clear; THROAT-clear & wnl.  Neck:  Supple w/ fair ROM; no JVD; normal carotid impulses w/o bruits; no thyromegaly or nodules palpated; no lymphadenopathy.  Chest:  Bibasilar crackles ~1/3rd way up the back;  No wheezing, rhonchi, signs of consolidation... Heart:  Regular Rhythm; norm S1 & S2 without murmurs, rubs, or gallops detected. Abdomen:  Soft & nontender- no guarding or rebound; normal bowel sounds; no organomegaly or masses palpated. Ext:  Normal ROM; without deformities or arthritic changes; no varicose veins, venous insuffic, or edema;  Pulses intact w/o bruits. Neuro:  No focal neuro deficits; sensory testing normal; gait normal & balance OK. Derm:  No lesions noted; no rash etc. Lymph:  No cervical, supraclavicular, axillary, or inguinal adenopathy palpated.   Assessment:      IMP >>     Interstitial Lung Disease-- UIP/ IPF w/ characteristic Hi-res CT Chest findings and prelim collagen-vasc labs showing elev Rheum Factor>600 & Sed=34, otherw neg Anti-CCP/ ANA/ & CRP    CARDIAC issues>  40 y/o, HBP, vasc dis w/ bilat carotid dis (s/p bilat CAEs) & AAA (4.1cm), TIA/ stroke, HL; followed by [name] & [name]...    MEDICAL issues>  3 y/o, HBP, vasc dis w/ bilat carotid dis (s/p bilat CAEs) & AAA, TIA/ stroke, HL, prostate ca (s/p seeds), DJD/ spondylosis, mild dementia; followed by [name] & [name]...  PLAN >>  06/24/15>   We discussed pulmonary fibrosis and the evaluation- they are familiar w/ this dis since a church friend was recently diagnosed and has since passed away; they are understandably concerned and I pointed out some differences in [place] presentation (ie- he has had this on his CXR w/o much change since 2015);  We will proceed w/ Labs and Hi-res CT Chest... 07/11/15>   Pt & family have opted for a middle-of-the-road approach and declined further testing &  Antifibrotic therapy; he appears to need O2 rx w/ activities now but is reluctant to accept this (we will check ONO & make final recommendation);  Plus we will give him a trial of Pred 10mg /d... 08/27/15>   Bill is stable on the [place] well; we wrote for a handicap placard; discussed asking [employer] company for a POC to encourage using oxygen w/ activities. 12/03/15>   Stable IPF/UIP on Pred10 + home O2, continue same; given 2017 Flu vaccine today, rec to add Delsym- 2tsp Bid for cough... 02/10/16>   This episode may have been triggered by an upper resp infection, nocturnal reflux and LPR, or an IPF exac-- rec treatment as above w/ O2, Depo/Pred bump, Levaquin, Mucinex, Hydromet, plus we initiated a vigorous antireflux protocol w/ PPI before dinner, NPO after dinner, elev HOB 6". 07/20/16>   Bill has gone thru major surg w/ Ao/Bi-iliac stent graft;  His IPF has deteriorated & I wonder if her had an exacerbation during this time? Now on 3L/min 7 we have maintained Pred 10mg /d.. 10/21/16>   Stable- continue same meds.      Plan:     CONTINUE HOME OXYGEN at 3L/min regularly...  Patient's Medications  New Prescriptions   No medications on file  Previous Medications   ACETAMINOPHEN (TYLENOL) 500 MG TABLET    Take 500 mg by mouth every 6 (six) hours as needed for mild pain.   ASPIRIN EC 81 MG TABLET    Take 81 mg by mouth daily.   AZELASTINE (ASTELIN) 0.1 % NASAL SPRAY    Place 2 sprays into both nostrils 2 (two) times daily.    CETIRIZINE (ZYRTEC) 10 MG TABLET    Take 10 mg by mouth daily as needed for allergies.    DEXTROMETHORPHAN-GUAIFENESIN (MUCINEX DM) 30-600 MG 12HR TABLET    Take 1 tablet by  mouth 2 (two) times daily.   L-METHYLFOLATE-B12-B6-B2 (CEREFOLIN) 08-20-48-5 MG TABS    Take 1 tablet by mouth daily.   MULTIPLE VITAMINS-MINERALS (CENTRUM SILVER PO)    Take 1 tablet by mouth daily.    NAPROXEN SODIUM (ANAPROX) 220 MG TABLET    Take 220 mg by mouth daily as needed (pain).   OMEGA-3  FATTY ACIDS (FISH OIL PO)    Take 1 capsule by mouth daily.   PANTOPRAZOLE (PROTONIX) 40 MG TABLET    Take 1 tablet (40 mg total) by mouth daily. 30 minutes before dinner   PREDNISONE (DELTASONE) 10 MG TABLET    TAKE 1 TABLET EVERY DAY WITH BREAKFAST.   RESVERATROL 100 MG CAPS    Take 100 mg by mouth every evening.    ROSUVASTATIN (CRESTOR) 5 MG TABLET    Take 5 mg by mouth at bedtime.  Modified Medications   No medications on file  Discontinued Medications   No medications on file

## 2016-10-25 ENCOUNTER — Other Ambulatory Visit: Payer: Self-pay | Admitting: Pulmonary Disease

## 2016-11-02 ENCOUNTER — Ambulatory Visit: Payer: Medicare Other | Admitting: Neurology

## 2016-11-08 ENCOUNTER — Ambulatory Visit: Payer: Medicare Other | Admitting: Cardiovascular Disease

## 2016-11-16 DIAGNOSIS — J84112 Idiopathic pulmonary fibrosis: Secondary | ICD-10-CM | POA: Diagnosis not present

## 2016-11-16 DIAGNOSIS — I2510 Atherosclerotic heart disease of native coronary artery without angina pectoris: Secondary | ICD-10-CM | POA: Diagnosis not present

## 2016-11-18 ENCOUNTER — Ambulatory Visit: Payer: Medicare Other | Admitting: Neurology

## 2016-11-29 DIAGNOSIS — R0789 Other chest pain: Secondary | ICD-10-CM | POA: Diagnosis not present

## 2016-11-29 DIAGNOSIS — Z23 Encounter for immunization: Secondary | ICD-10-CM | POA: Diagnosis not present

## 2016-12-01 ENCOUNTER — Encounter: Payer: Self-pay | Admitting: Neurology

## 2016-12-01 ENCOUNTER — Ambulatory Visit (INDEPENDENT_AMBULATORY_CARE_PROVIDER_SITE_OTHER): Payer: Medicare Other | Admitting: Neurology

## 2016-12-01 VITALS — BP 130/76 | HR 66 | Wt 182.4 lb

## 2016-12-01 DIAGNOSIS — G3184 Mild cognitive impairment, so stated: Secondary | ICD-10-CM

## 2016-12-01 NOTE — Progress Notes (Signed)
[facility]  PATIENT: [name] [name] DOB: May 12, 1929   REASON FOR VISIT: Follow-up for mild cognitive impairment history of stroke January 2015 HISTORY FROM: Patient and wife [name] [name]    HISTORY OF PRESENT ILLNESS: HISTORY 24 year Caucasian male seen for first office followup visit from hospital consideration for stroke in January 2015. He presented with difficulty picking things up with his left hand as well as grip weakness while playing golf. He also subsequently diescribed, headache and some speech and language difficulties and double vision. His symptoms improved soon after arrival and hence he was not considered for thrombolysis. CT scan of the head showed an area of low attenuation in the right anterior limb internal capsule suggestive of a subacute lacunar infarct which was subsequently confirmed on MRI . 2D echo showed ejection fraction of 45-50% with diffuse hypokinesis. Carotid Doppler showed progressive 80-99% right ICA stenosis and 1-39% left ICA stenosis at the previous surgical site. Vascular surgery was consulted . Patient was started on aspirin. Patient was scheduled for and underwent elective right carotid endarterectomy by Dr Kellie Simmering and the procedure went well. Patient was placed on Plavix by me but after the surgery was changed to aspirin. He states his blood pressure is fine and is 103/65 today and it usually runs in the 120s at home. The patient's wife has noted that he has subacute decline in his memory as well as multitasking and cognitive abilities. She is concerned about this and would like evaluation. The patient himself denies this. He has not had an evaluation for these complaints before.   UPDATE 08/27/13 (LL): Since last visit, patient has continued taking Cerefolin NAC for memory. He does not notice any change in his memory, but states he knows his short term memory is not as good as it used to be. He is tolerating Plavix well with no signs of significant  bleeding or bruising. His blood pressure is well controlled, it is 125/79 in the office today. He states he stays active, plays golf a couple days a week. Update 05/10/2014 PShe returns for follow-up accompanied by his wife. He states his memory difficulties are unchanged. He in fact is doing well and is still able to handle finances for the home and business. He is taking fish oil as well as Cerefolin everyday. The patient also remains on Plavix which is tolerating well without bleeding or bruising. He had lipid profile checked in December 2015 by primary physician which was fine. He also had follow-up carotid ultrasound done last week by Dr. Kellie Simmering which was okay as well. He exercises regularly and plays golf as well. He has no new complaints. He has not had any recurrent stroke or TIA symptoms. Update 11/05/2014:PS He returns for follow-up after last visit 6 months ago. He is accompanied by his wife. He states his short-term memory difficulties continue. He has occasional fecal times in judgment and multitasking. However overall his symptoms seem stable. He continues to take Cerefolin daily. He does participate in doing sudoku several times a week. He does play golf twice a week. He did have a follow-up carotid ultrasound checked in February and Dr. Evelena Leyden office and it was fine. He remains on Plavix which is tolerating well without bleeding or bruising. He has not had any recurrent stroke or TIA symptoms. He remains on Crestor without any myalgias or arthralgias. His blood pressure is elevated today in office at 150/93 but he blames this on white coat hypertension. He did have lipid profile checked  2 months ago by Dr. Laurann Montana and it was fine.  UPDATE 05/06/2015 CMMr. Baldwin, 83 year old male returns for follow-up. He is accompanied by his wife [name] [name]. He has not had further stroke or TIA symptoms since last seen. He is currently on Plavix with minimal bruising. His memory has not progressed since last seen. He  continues to play golf several times a week and participates in card games, and sudoku. He remains on Cerefolin. His lipids are followed by primary care and he remains on Crestor without side effects. Carotid Dopplers followed yearly by Dr. Kellie Simmering. He returns for reevaluation UPDATE 08/15/2017CM [name], 83 year old returns for follow-up with his wife. He has a history of stroke in January 2015. He has not had further stroke or TIA symptoms since that time he is currently on Plavix with minimal bruising. His memory is stable, MMSE 27/30. He continues to play golf twice a week and participate in other activities to stimulate his mind. He remains on Cerefolin. He was recently diagnosed with pulmonary fibrosis and is using oxygen at night. He remains on Crestor and lipids are followed by primary care Dr. Laurann Montana.  Carotid Doppler is followed by [facility]. Doppler is due to February 2018. He returns for reevaluation Update 05/04/2016 : He returns for follow-up after last visit 6 months ago. He is accompanied by his wife. He states his doing well. He has had no recurrent stroke or TIA symptoms now for 2 years. He is on Plavix but is having significant skin bruising. He in fact is currently having 2 areas that he has Band-Aids because of the bruising. Patient does have regular follow up with vascular surgeon for his aortic aneurysm as well as carotid surgery surveillance. He states her blood pressure is under good control and today it is 130/83. He recently had lipid profile checked on January 2 with LDL cholesterol was 63, HDL 77, triglycerides 116 and total cholesterol 163 mg percent. He has been diagnosed with pulmonary fibrosis and started on home oxygen at night only. He states he feels and in the day. Vistaril memory difficulties about the same. He remains on Cerefolin NAC and Reservatrol. He is mostly independent in history driving. He has short-term memory difficulties mostly. He has no other new complaints Update  12/01/2016 : He returns for follow-up after last of that 6 months ago. He is accompanied by his wife. They feel his memory difficulties about unchanged. She still has mild short-term memory difficulties. He does keep himself busy in mentally challenging activities like doing sudoku and playing computer games. He is on home oxygen with pulmonary fibrosis which limits his physical activity. He has noticed diminished bruising after changing from Plavix to aspirin. He is had no recurrent stroke or TIA symptoms. He has no new complaints today. The patient has his wife has decided not to participate and early dementia studies at the present time hence they do not have the time REVIEW OF SYSTEMS: Full 14 system review of systems performed and notable only for those listed, all others are neg:   Shortness of breath, cough, incontinence of bladder, frequency of urination, speech difficulty, memory loss and all other systems negative  ALLERGIES: Allergies  Allergen Reactions  . Sudafed [Pseudoephedrine Hcl]     Causes dizziness and nervousness  . Atorvastatin Other (See Comments)    Reaction: Patient's family reports "confusion" and would prefer crestor     HOME MEDICATIONS: Outpatient Medications Prior to Visit  Medication Sig Dispense Refill  . acetaminophen (TYLENOL)  500 MG tablet Take 500 mg by mouth every 6 (six) hours as needed for mild pain.    Marland Kitchen aspirin EC 81 MG tablet Take 81 mg by mouth daily.    Marland Kitchen azelastine (ASTELIN) 0.1 % nasal spray Place 2 sprays into both nostrils 2 (two) times daily.   4  . cetirizine (ZYRTEC) 10 MG tablet Take 10 mg by mouth daily as needed for allergies.     Marland Kitchen dextromethorphan-guaiFENesin (MUCINEX DM) 30-600 MG 12hr tablet Take 1 tablet by mouth 2 (two) times daily.    Marland Kitchen L-Methylfolate-B12-B6-B2 (CEREFOLIN) 08-20-48-5 MG TABS Take 1 tablet by mouth daily. 90 each 4  . Multiple Vitamins-Minerals (CENTRUM SILVER PO) Take 1 tablet by mouth daily.     . naproxen sodium  (ANAPROX) 220 MG tablet Take 220 mg by mouth daily as needed (pain).    . Omega-3 Fatty Acids (FISH OIL PO) Take 1 capsule by mouth daily.    . pantoprazole (PROTONIX) 40 MG tablet TAKE 1 TABLET DAILY,30 MINUTES BEFORE DINNER. 30 tablet 3  . predniSONE (DELTASONE) 10 MG tablet TAKE 1 TABLET EVERY DAY WITH BREAKFAST. 30 tablet 2  . Resveratrol 100 MG CAPS Take 100 mg by mouth every evening.     . rosuvastatin (CRESTOR) 5 MG tablet Take 5 mg by mouth at bedtime.     Facility-Administered Medications Prior to Visit  Medication Dose Route Frequency Provider Last Rate Last Dose  . aspirin EC tablet 81 mg  81 mg Oral Daily Garvin Fila, MD        PAST MEDICAL HISTORY: Past Medical History:  Diagnosis Date  . AAA (abdominal aortic aneurysm) ([place])   . Allergy   . Carotid artery stenosis   . Diplopia   . GERD (gastroesophageal reflux disease)   . Hyperlipidemia   . Prostate cancer ([place])   . Pulmonary fibrosis ([place])   . Spondylisthesis 2015   Spine  . Stroke ([place]) 04/18/13   some weakness left-returning strength  . TIA (transient ischemic attack)     PAST SURGICAL HISTORY: Past Surgical History:  Procedure Laterality Date  . ABDOMINAL AORTIC ENDOVASCULAR STENT GRAFT N/A 06/17/2016   Procedure: ABDOMINAL AORTIC ENDOVASCULAR STENT GRAFT;  Surgeon: Waynetta Sandy, MD;  Location: [facility] OR;  Service: Vascular;  Laterality: N/A;  . CAROTID ENDARTERECTOMY  Sept. 2008   Elective left carotid endarterectomy  . CAROTID ENDARTERECTOMY Right 04/27/2013   CE  . ENDARTERECTOMY Right 04/27/2013   Procedure: ENDARTERECTOMY CAROTID;  Surgeon: Mal Misty, MD;  Location: [facility];  Service: Vascular;  Laterality: Right;  . EYE SURGERY     Bilateral cataract   . FOOT SURGERY      left Achilles Tendon Repair  . seed implantation  2001   for prostate ca    FAMILY HISTORY: Family History  Problem Relation Age of Onset  . Stroke Mother   . Heart disease Mother        After age 73  .  Varicose Veins Mother   . Hypertension Mother   . Cancer Mother        Theadora Rama   . Stroke Father   . Heart attack Father   . Deep vein thrombosis Father   . Anuerysm Father   . Heart disease Brother     SOCIAL HISTORY: Social History   Social History  . Marital status: Married    Spouse name: lee  . Number of children: 2  . Years of education: college  Occupational History  . retired Retired    Programme researcher, broadcasting/film/video    Social History Main Topics  . Smoking status: Former Smoker    Packs/day: 1.00    Years: 14.00    Types: Cigarettes    Quit date: 03/27/1965  . Smokeless tobacco: Never Used     Comment: occ alcohol  . Alcohol use 0.6 oz/week    1 Shots of liquor per week     Comment: 3-4 drinks week bourbon  . Drug use: No  . Sexual activity: Not Currently   Other Topics Concern  . Not on file   Social History Narrative  . No narrative on file     PHYSICAL EXAM  Vitals:   12/01/16 0915  BP: 130/76  Pulse: 66  Weight: 182 lb 6.4 oz (82.7 kg)   Body mass index is 26.94 kg/m. General: well developed, well nourished elderly Caucasian male, seated,on home oxygen in mild respiratory distress  Head: head normocephalic and atraumatic.  Neck: supple without carotid bruit, previous bil CE scars  Cardiovascular: regular rate and rhythm, no murmurs Lungs: scattered crackles lower lung fields bilaterally to auscultation  Musculoskeletal: no deformity  Skin: no rash/petichiae  Vascular: Normal pulses all extremities   Neurologic Exam  Mental Status: Awake and fully alert. Oriented to place and time.Diminished immediate recall.Remote memory intact. Attention span, concentration and fund of knowledge appropriate. Mood and affect appropriate. MMSE 27/30 ( last visit 29/30.) AFT 4. Clock drawing 4/4.  Cranial Nerves: Fundoscopic exam not done. Pupils equal, briskly reactive to light. Extraocular movements full without nystagmus. Visual fields full to  confrontation. Hearing intact. Facial sensation intact. Face, tongue, palate moves normally and symmetrically.  Motor: Normal bulk and tone. Normal strength in all tested extremity muscles.   Sensory: intact to light touch , pinprick and vibratory in all 4 extremities. Coordination: Rapid alternating movements normal in all extremities. Finger-to-nose and heel-to-shin performed accurately bilaterally.  Gait and Station: Arises from chair without difficulty. Stance is normal. Gait demonstrates normal stride length and balance. Able to heel, toe and tandem walk with mild difficulty. No assistive device Reflexes: 1+ and symmetric.   DIAGNOSTIC DATA (LABS, IMAGING, TESTING) - ASSESSMENT AND PLAN 86 year with right posterior frontal infarct in January 2015 secondary to symptomatic right ICA stenosis for which he has undergone elective right carotid endarterectomy. Multiple vascular risk factors of carotid stenosis, hyperlipidemia, peripheral vascular disease and history of TIA. He also has pre-existing memory loss which is likely mild cognitive impairment which appears stable.  I had a long discussion with the patient and his wife regarding his mild cognitive impairment which appears to be stable. Continue Cerefolinin NAC as well as the Reservatrol 100 mg daily and participate in mentally challenging activities like solving crossword puzzles, sudoku and playing bridge. I also encouraged him to participate in computer mind games. We also discussed memory compensation strategies. Continue aspirin for stroke prevention. He will return for follow-up in 6 months with my nurse practitioner Hoyle Sauer call earlier if necessaryGreater than 50% time during this 25 minute visit was spent on counseling and coordination of care about his carotid surgery stroke risk memory impairment and discussion on same questions Return for follow-up with me in 6 months or call earlier if necessary. Antony Contras, MD [facility]  [facility] [address], [place], [place] [address] [phone]

## 2016-12-01 NOTE — Patient Instructions (Signed)
I had a long discussion with the patient and his wife regarding his mild cognitive impairment which appears to be stable. Continue Cerefolinin NAC as well as the Reservatrol 100 mg daily and participate in mentally challenging activities like solving crossword puzzles, sudoku and playing bridge. I also encouraged him to participate in computer mind games. We also discussed memory compensation strategies. Continue aspirin for stroke prevention. He will return for follow-up in 6 months with my nurse practitioner Hoyle Sauer call earlier if necessary  Memory Compensation Strategies  1. Use "WARM" strategy.  W= write it down  A= associate it  R= repeat it  M= make a mental note  2.   You can keep a Social worker.  Use a 3-ring notebook with sections for the following: calendar, important names and phone numbers,  medications, doctors' names/phone numbers, lists/reminders, and a section to journal what you did  each day.   3.    Use a calendar to write appointments down.  4.    Write yourself a schedule for the day.  This can be placed on the calendar or in a separate section of the Memory Notebook.  Keeping a  regular schedule can help memory.  5.    Use medication organizer with sections for each day or morning/evening pills.  You may need help loading it  6.    Keep a basket, or pegboard by the door.  Place items that you need to take out with you in the basket or on the pegboard.  You may also want to  include a message board for reminders.  7.    Use sticky notes.  Place sticky notes with reminders in a place where the task is performed.  For example: " turn off the  stove" placed by the stove, "lock the door" placed on the door at eye level, " take your medications" on  the bathroom mirror or by the place where you normally take your medications.  8.    Use alarms/timers.  Use while cooking to remind yourself to check on food or as a reminder to take your medicine, or as a  reminder to  make a call, or as a reminder to perform another task, etc.

## 2016-12-12 ENCOUNTER — Other Ambulatory Visit: Payer: Self-pay | Admitting: Pulmonary Disease

## 2016-12-14 NOTE — Progress Notes (Signed)
Patient ID: [name] [name], male   DOB: 10/24/1929, 83 y.o.   MRN: [id number] 83 y.o.  F/u for his vascular disease. He is s/p bilateral CEA and EVAR for AAA  Denies history of CAD.    Myovue was non ischemic 05/27/13  EF 56%    Having some memory issues   CT 07/01/15  ILD with bronchiectasis moderate honeycombing UIP Wife having a hard time adjusting to his medical illnesses especially ILD and dementia  EVAR with AAA repair Dr Idalia Needle March 2018 [place] Bi-iliac endovascular stent graft with ipsilateral right main body 28 x 12 x 16cm and contralateral limb 12 x 14cm    No abdominal pain Wearing oxygen as needed Memory stable has had flu shot Wife looks after him well  ROS: Denies fever, malais, weight loss, blurry vision, decreased visual acuity, cough, sputum, SOB, hemoptysis, pleuritic pain, palpitaitons, heartburn, abdominal pain, melena, lower extremity edema, claudication, or rash.  All other systems reviewed and negative  General: BP 130/72   Pulse 88   Ht 5\' 10"  (1.778 m)   Wt 182 lb (82.6 kg)   SpO2 90%   BMI 26.11 kg/m  Affect appropriate Healthy:  appears stated age 24: normal Neck supple with no adenopathy JVP normal post bilateral CEA;s with bruits no thyromegaly Lungs clear with no wheezing and good diaphragmatic motion Heart:  S1/S2 no murmur, no rub, gallop or click PMI normal Abdomen: benighn, BS positve, no tenderness, no AAA no bruit.  No HSM or HJR Distal pulses intact with no bruits No edema Neuro non-focal Skin warm and dry No muscular weakness    Current Outpatient Prescriptions  Medication Sig Dispense Refill  . acetaminophen (TYLENOL) 500 MG tablet Take 500 mg by mouth every 6 (six) hours as needed for mild pain.    Marland Kitchen aspirin EC 81 MG tablet Take 81 mg by mouth daily.    Marland Kitchen azelastine (ASTELIN) 0.1 % nasal spray Place 2 sprays into both nostrils 2 (two) times daily.   4  . cetirizine (ZYRTEC) 10 MG tablet Take 10 mg by mouth daily as needed for  allergies.     Marland Kitchen dextromethorphan-guaiFENesin (MUCINEX DM) 30-600 MG 12hr tablet Take 1 tablet by mouth 2 (two) times daily.    . diclofenac (VOLTAREN) 50 MG EC tablet   0  . L-Methylfolate-B12-B6-B2 (CEREFOLIN) 08-20-48-5 MG TABS Take 1 tablet by mouth daily. 90 each 4  . Multiple Vitamins-Minerals (CENTRUM SILVER PO) Take 1 tablet by mouth daily.     . naproxen sodium (ANAPROX) 220 MG tablet Take 220 mg by mouth daily as needed (pain).    . Omega-3 Fatty Acids (FISH OIL PO) Take 1 capsule by mouth daily.    . pantoprazole (PROTONIX) 40 MG tablet TAKE 1 TABLET DAILY,30 MINUTES BEFORE DINNER. 30 tablet 3  . predniSONE (DELTASONE) 10 MG tablet TAKE 1 TABLET EVERY DAY WITH BREAKFAST. 30 tablet 0  . Resveratrol 100 MG CAPS Take 100 mg by mouth every evening.     . rosuvastatin (CRESTOR) 5 MG tablet Take 5 mg by mouth at bedtime.     Current Facility-Administered Medications  Medication Dose Route Frequency Provider Last Rate Last Dose  . aspirin EC tablet 81 mg  81 mg Oral Daily Garvin Fila, MD        Allergies  Sudafed [pseudoephedrine hcl] and Atorvastatin  Electrocardiogram:  11/21/13  NSR normal ECG  01/22/15  SR rate 65 LVH no change 12/16/16 NSR rate 72 normal   Assessment  and Plan  Carotid: right bruit no change Post bilateral endarterectomies Duplex done 05/06/15 reviewed and plaque no restenosis f/u Dr Kellie Simmering  AAA: Post EVAR with type two endoleak f/u Dr Donzetta Matters [facility]  Chol:  Lab Results  Component Value Date   [place] 62 04/19/2013    Pulmonary: seems to have advanced ILD continue oxygen and steroids f/u [facility]  Dementia: with some tardo kinesia consider neuro evaluation and [place]   F/U with me in a year   Jenkins Rouge

## 2016-12-16 ENCOUNTER — Encounter: Payer: Self-pay | Admitting: Cardiovascular Disease

## 2016-12-16 ENCOUNTER — Ambulatory Visit (INDEPENDENT_AMBULATORY_CARE_PROVIDER_SITE_OTHER): Payer: Medicare Other | Admitting: Cardiovascular Disease

## 2016-12-16 VITALS — BP 130/72 | HR 88 | Ht 70.0 in | Wt 182.0 lb

## 2016-12-16 DIAGNOSIS — I2510 Atherosclerotic heart disease of native coronary artery without angina pectoris: Secondary | ICD-10-CM

## 2016-12-16 DIAGNOSIS — I714 Abdominal aortic aneurysm, without rupture, unspecified: Secondary | ICD-10-CM

## 2016-12-16 DIAGNOSIS — I6523 Occlusion and stenosis of bilateral carotid arteries: Secondary | ICD-10-CM

## 2016-12-16 NOTE — Patient Instructions (Addendum)

## 2016-12-17 DIAGNOSIS — I2510 Atherosclerotic heart disease of native coronary artery without angina pectoris: Secondary | ICD-10-CM | POA: Diagnosis not present

## 2016-12-17 DIAGNOSIS — J84112 Idiopathic pulmonary fibrosis: Secondary | ICD-10-CM | POA: Diagnosis not present

## 2016-12-30 ENCOUNTER — Telehealth: Payer: Self-pay | Admitting: Vascular Surgery

## 2016-12-30 NOTE — Telephone Encounter (Signed)
Per instructions from [place] on 07/23/16 I scheduled a CTA abd/pelvis for the patient on 02/03/17 at 4:30pm. He is to arrive at 4:10pm at the [address] location of [facility].  No solid foods 4 hours prior but liquids and medications are fine. He is scheduled to see Dr.Cain on Friday 02/04/17 at 3:15pm. I gave all the information to his wife over the phone and also mailed a letter with the above info. awt

## 2017-01-11 ENCOUNTER — Other Ambulatory Visit: Payer: Self-pay | Admitting: Pulmonary Disease

## 2017-01-16 DIAGNOSIS — I2510 Atherosclerotic heart disease of native coronary artery without angina pectoris: Secondary | ICD-10-CM | POA: Diagnosis not present

## 2017-01-16 DIAGNOSIS — J84112 Idiopathic pulmonary fibrosis: Secondary | ICD-10-CM | POA: Diagnosis not present

## 2017-01-28 ENCOUNTER — Ambulatory Visit: Payer: Medicare Other | Admitting: Vascular Surgery

## 2017-02-03 ENCOUNTER — Ambulatory Visit
Admission: RE | Admit: 2017-02-03 | Discharge: 2017-02-03 | Disposition: A | Discharge status: Home / Self Care | Payer: Medicare Other | Source: Ambulatory Visit | Attending: Vascular Surgery | Admitting: Vascular Surgery

## 2017-02-03 DIAGNOSIS — I714 Abdominal aortic aneurysm, without rupture, unspecified: Secondary | ICD-10-CM

## 2017-02-03 MED ORDER — IOPAMIDOL (ISOVUE-370) INJECTION 76%
75.0000 mL | Freq: Once | INTRAVENOUS | Status: AC | PRN
  Administered 2017-02-03: 75 mL via INTRAVENOUS

## 2017-02-04 ENCOUNTER — Ambulatory Visit: Payer: Medicare Other | Admitting: Vascular Surgery

## 2017-02-04 ENCOUNTER — Encounter: Payer: Self-pay | Admitting: Vascular Surgery

## 2017-02-04 VITALS — BP 128/68 | HR 83 | Resp 20 | Ht 70.0 in | Wt 175.0 lb

## 2017-02-04 DIAGNOSIS — I714 Abdominal aortic aneurysm, without rupture, unspecified: Secondary | ICD-10-CM

## 2017-02-04 NOTE — Progress Notes (Signed)
Patient ID: [name] [name], male   DOB: [birth year], 83 y.o.   MRN: [id number]  Reason for Consult: Follow-up (6 month f/u )   Referred by Kelton Pillar, MD  Subjective:     HPI:  [name] [name] is a 83 y.o. 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  At follow-up he was noted to have a type II endoleak with unchanged size aneurysm.  He now follows up 6 months later with repeat CT scan.  He continues to play golf.  He has no new back or abdominal pain.  He is actually doing quite well from a health standpoint although he does remain on oxygen at home.  He has no complaints related to today's visit.  Past Medical History:  Diagnosis Date  . AAA (abdominal aortic aneurysm) ([place])   . Allergy   . Carotid artery stenosis   . Diplopia   . GERD (gastroesophageal reflux disease)   . Hyperlipidemia   . Prostate cancer ([place])   . Pulmonary fibrosis ([place])   . Spondylisthesis 2015   Spine  . Stroke ([place]) 04/18/13   some weakness left-returning strength  . TIA (transient ischemic attack)    Family History  Problem Relation Age of Onset  . Stroke Mother   . Heart disease Mother        After age 16  . Varicose Veins Mother   . Hypertension Mother   . Cancer Mother        Theadora Rama   . Stroke Father   . Heart attack Father   . Deep vein thrombosis Father   . Anuerysm Father   . Heart disease Brother    Past Surgical History:  Procedure Laterality Date  . ABDOMINAL AORTIC ENDOVASCULAR STENT GRAFT N/A 06/17/2016   Performed by Waynetta Sandy, MD at [place]  . CAROTID ENDARTERECTOMY  Sept. 2008   Elective left carotid endarterectomy  . CAROTID ENDARTERECTOMY Right 04/27/2013   CE  . ENDARTERECTOMY CAROTID Right 04/27/2013   Performed by Mal Misty, MD at [place]  . EYE SURGERY     Bilateral cataract   . FOOT SURGERY      left Achilles Tendon Repair  . seed implantation  2001   for prostate ca    Short Social History:  Social History    Tobacco Use  . Smoking status: Former Smoker    Packs/day: 1.00    Years: 14.00    Pack years: 14.00    Types: Cigarettes    Last attempt to quit: 03/27/1965    Years since quitting: 51.8  . Smokeless tobacco: Never Used  . Tobacco comment: occ alcohol  Substance Use Topics  . Alcohol use: Yes    Alcohol/week: 0.6 oz    Types: 1 Shots of liquor per week    Comment: 3-4 drinks week bourbon    Allergies  Allergen Reactions  . Sudafed [Pseudoephedrine Hcl]     Causes dizziness and nervousness  . Atorvastatin Other (See Comments)    Reaction: Patient's family reports "confusion" and would prefer crestor     Current Outpatient Medications  Medication Sig Dispense Refill  . acetaminophen (TYLENOL) 500 MG tablet Take 500 mg by mouth every 6 (six) hours as needed for mild pain.    Marland Kitchen aspirin EC 81 MG tablet Take 81 mg by mouth daily.    Marland Kitchen azelastine (ASTELIN) 0.1 % nasal spray Place 2 sprays into both nostrils 2 (two) times daily.  4  . cetirizine (ZYRTEC) 10 MG tablet Take 10 mg by mouth daily as needed for allergies.     Marland Kitchen dextromethorphan-guaiFENesin (MUCINEX DM) 30-600 MG 12hr tablet Take 1 tablet by mouth 2 (two) times daily.    . diclofenac (VOLTAREN) 50 MG EC tablet   0  . L-Methylfolate-B12-B6-B2 (CEREFOLIN) 08-20-48-5 MG TABS Take 1 tablet by mouth daily. 90 each 4  . Multiple Vitamins-Minerals (CENTRUM SILVER PO) Take 1 tablet by mouth daily.     . naproxen sodium (ANAPROX) 220 MG tablet Take 220 mg by mouth daily as needed (pain).    . Omega-3 Fatty Acids (FISH OIL PO) Take 1 capsule by mouth daily.    . pantoprazole (PROTONIX) 40 MG tablet TAKE 1 TABLET DAILY,30 MINUTES BEFORE DINNER. 30 tablet 3  . predniSONE (DELTASONE) 10 MG tablet TAKE 1 TABLET EVERY DAY WITH BREAKFAST. 30 tablet 0  . Resveratrol 100 MG CAPS Take 100 mg by mouth every evening.     . rosuvastatin (CRESTOR) 5 MG tablet Take 5 mg by mouth at bedtime.     Current Facility-Administered Medications   Medication Dose Route Frequency Provider Last Rate Last Dose  . aspirin EC tablet 81 mg  81 mg Oral Daily Garvin Fila, MD        Review of Systems  Constitutional:  Constitutional negative. HENT: HENT negative.  Eyes: Eyes negative.  Respiratory: Positive for cough, shortness of breath and wheezing.  Cardiovascular: Cardiovascular negative.  GI: Gastrointestinal negative.  Musculoskeletal: Musculoskeletal negative.  Skin: Skin negative.  Neurological: Neurological negative. Hematologic: Hematologic/lymphatic negative.  Psychiatric: Psychiatric negative.        Objective:  Objective   Vitals:   02/04/17 1521  BP: 128/68  Pulse: 83  Resp: 20  SpO2: (!) 88%  Weight: 175 lb (79.4 kg)  Height: 5\' 10"  (1.778 m)   Body mass index is 25.11 kg/m.  Physical Exam  Constitutional: He is oriented to person, place, and time. He appears well-developed.  HENT:  Head: Normocephalic.  Eyes: Pupils are equal, round, and reactive to light.  Neck: Normal range of motion.  Cardiovascular: Normal rate.  Pulses:      Radial pulses are 2+ on the right side, and 2+ on the left side.       Femoral pulses are 2+ on the right side, and 2+ on the left side. Pulmonary/Chest: Effort normal.  Abdominal: Soft. He exhibits no mass.  Musculoskeletal: He exhibits no edema.  Neurological: He is alert and oriented to person, place, and time.  Skin: Skin is warm and dry.  Psychiatric: He has a normal mood and affect. His behavior is normal. Judgment and thought content normal.    Data: IMPRESSION: VASCULAR  1. Stable aneurysm sac size of 5.1 x 5.9 cm. Mild type 2 endoleak remain suspected based on increased density within the inferior right aspect of the aneurysm sac on delayed imaging. Outflow remain suspected via the accessory lower pole right renal artery based on delayed perfusion of the right lower kidney. Inflow is more difficult to assess on the current study due to  significant respiratory motion on the arterial phase scan. Lumbar inflow remains the most likely source of type 2 endoleak. 2. Mild aneurysmal disease of the native proximal aorta remains stable measuring 3.3 cm in greatest diameter just below the SMA origin.      Assessment/Plan:     83 year old male status post endovascular repair of aneurysm that has a stable type II endoleak size  to 5.9 cm.  Given that it is unchanged we will follow him up in 6 months with duplex.  If the size then change at that time we can probably go out to 1 year.  I discussed with him that the reasons to intervene would be rupture or increasing sac size.  I do think that rupture is low risk at this time therefore no intervention is merited.  Should he have new back or abdominal pain he will seek medical attention.  We will otherwise see him in 6 months with duplex.     Waynetta Sandy MD [facility]

## 2017-02-07 NOTE — Addendum Note (Signed)
Addended by: Jaianna Nicoll A on: 02/07/2017 04:32 PM   Modules accepted: Orders  

## 2017-02-13 ENCOUNTER — Other Ambulatory Visit: Payer: Self-pay | Admitting: Pulmonary Disease

## 2017-02-16 DIAGNOSIS — J84112 Idiopathic pulmonary fibrosis: Secondary | ICD-10-CM | POA: Diagnosis not present

## 2017-02-16 DIAGNOSIS — I2510 Atherosclerotic heart disease of native coronary artery without angina pectoris: Secondary | ICD-10-CM | POA: Diagnosis not present

## 2017-02-22 ENCOUNTER — Encounter: Payer: Self-pay | Admitting: Pulmonary Disease

## 2017-02-22 ENCOUNTER — Ambulatory Visit: Payer: Medicare Other | Admitting: Pulmonary Disease

## 2017-02-22 VITALS — BP 138/86 | HR 92 | Ht 70.0 in | Wt 182.0 lb

## 2017-02-22 DIAGNOSIS — I714 Abdominal aortic aneurysm, without rupture, unspecified: Secondary | ICD-10-CM

## 2017-02-22 DIAGNOSIS — Z95828 Presence of other vascular implants and grafts: Secondary | ICD-10-CM

## 2017-02-22 DIAGNOSIS — I2510 Atherosclerotic heart disease of native coronary artery without angina pectoris: Secondary | ICD-10-CM | POA: Diagnosis not present

## 2017-02-22 DIAGNOSIS — Z9889 Other specified postprocedural states: Secondary | ICD-10-CM | POA: Diagnosis not present

## 2017-02-22 DIAGNOSIS — R413 Other amnesia: Secondary | ICD-10-CM

## 2017-02-22 DIAGNOSIS — J9611 Chronic respiratory failure with hypoxia: Secondary | ICD-10-CM

## 2017-02-22 DIAGNOSIS — J84112 Idiopathic pulmonary fibrosis: Secondary | ICD-10-CM | POA: Diagnosis not present

## 2017-02-22 DIAGNOSIS — M5416 Radiculopathy, lumbar region: Secondary | ICD-10-CM | POA: Diagnosis not present

## 2017-02-22 DIAGNOSIS — Z8679 Personal history of other diseases of the circulatory system: Secondary | ICD-10-CM

## 2017-02-22 DIAGNOSIS — Z8673 Personal history of transient ischemic attack (TIA), and cerebral infarction without residual deficits: Secondary | ICD-10-CM

## 2017-02-22 DIAGNOSIS — I6523 Occlusion and stenosis of bilateral carotid arteries: Secondary | ICD-10-CM | POA: Diagnosis not present

## 2017-02-22 MED ORDER — AZITHROMYCIN 250 MG PO TABS: ORAL_TABLET | ORAL | 0 refills | Status: DC

## 2017-02-22 NOTE — Progress Notes (Signed)
Subjective:     Patient ID: [name] [name], male   DOB: September 02, 1929, 83 y.o.   MRN: [id number]  HPI 83 y/o WF w/ mult co-morbidities followed for pulm fibrosis (IPF/UIP) and hypoxemic resp insufficiency- on Prednisone and Home O2...  ~  June 24, 2015:  Initial pulmonary consult by [name] [name]>  The Britts are friends/ church members with the [name] [name] family... 83 y/o WM referred by Dr.Elaine Laurann [name] for further evaluation & treatment of pulmonary fibrosis>  He recently had a vasc surg follow up visit where they noted crackles in the lung bases and referred him back to [name] [name];  He c/o a slight breathing problem w/ SOB/DOE after mowing the yard for 40 min, climbing hills, playing golf but no real change for years he says (stable- not progressive);  He has noted a slight dry cough x1-2yrs and actually saw ENT for eval post nasal drip/ congestion but no better after sinusitis treatment;  Then he saw Allergist- neg testing, no allergies found;  He has occas beige/yellow sput production, denies hx bronchitis/ recurrent infections, and no f/c/s;  He was seen by [facility] for f/u CDopplers (they remain patent s/p bilat CAEs) and they noted crackles at L>R lung base;  Pt rechecked by [name] [name] w/ similar findings and she rechecked CXR & PFT (see below) => refer to pulmonary...   Smoking Hx>  He is an ex-smoker; started around age 44, smoked for ~60yrs but always <1ppd; Quit smoking 1966 w/ a 10 pack-year smoking hx...  Pulmonary Hx>  He denies any hx asthma, does not recall treatment for bronchits, never had pneumonia, no hx TB or known exposure;  He has been treated for sinus infections.  Medical Hx>  HBP, vasc dis w/ carotid dis (s/p bilat CAEs) & AAA, TIA/ stroke, HL, prostate ca (s/p seeds), DJD/ spondylosis, mild dementia  Family Hx>  There is no family hx of lung disease;  Father was a smoker & had a heart attack & stroke.  Occup Hx>  He was the Psychologist, educational for [employer]- no known exposure to  asbestos, silica dust, other inorganic or organic dusts/ materials...  Current Meds>  Astelin/ Atrovent nasal, Zyrtek, Plavix, Resveratrol, Crestor, Fish Oil, Vits... EXAM shows Afeb, VSS, O2sat=92% on RA;  HEENT- neg, mallampati2;  Chest- bibasilar velcro rales L>R ~1/3rd the way up post chest wall;  Heart- RR w/o m/r/g;  Abd- soft, nontender, neg;  Ext- neg w/o c/c/e...  CT Angio Abd done 01/02/08>  Chronic interstitial changes noted at the lung bases bilat; fusiform infrarenal AAA at 3.5cm, mod ostial dis of celiac axis origin...   Old CXR 08/15/08 showed norm heart size, patchy opac at the left lung base, otherw neg...  Old CXR 04/18/13 showed norm heart size, sl tortuous Ao, diffuse incr in interstitial markings L>R...  Recent CXR 05/21/15>  Heart at upper lim of normal, prominent coarse interstitial markings bilat L>R, no focal abn & unchanged from 2015 film...  Full PFT 05/29/15> FVC=2.83 (73%), FEV1=2.48 (91%), %1sec=87, mid-flows wnl at 155% predicted; TLC=4.97 (70%), RV=2.08 (76%), RV/TLC=42%; DLCO=23% predicted; c/w a mild restrictive ventilatory defect & severe decr DLCO.   Ambulatory Oximetry 06/24/15>  O2sat=100% on RA at rest w/ pulse=59/min;  He ambulated only 2 Laps in office w/ lowest O2sat=86% w/ pulse=91/min... He indicated that he does NOT want oxygen at this point.  LABS 06/24/15>  Chems- wnl w/ Cr=0.96;  CBC- wnl w/ Hg=13.8;  TSH=4.55;  Sed=34;  CRP=0.8;  RF>600;  CCP=<16;  ANA=neg  CXR 04/18/13                                          CXR 05/21/15     IMP >>     Interstitial Lung Disease-- likely IPF and we will proceed w/ His-res CT Chest, & Preliminary LAB screen...    CARDIAC issues>  53 y/o, HBP, vasc dis w/ bilat carotid dis (s/p bilat CAEs) & AAA (4.1cm), TIA/ stroke, HL; followed by [name] [name] & [name] [name]...    MEDICAL issues>  89 y/o, HBP, vasc dis w/ bilat carotid dis (s/p bilat CAEs) & AAA, TIA/ stroke, HL, prostate ca (s/p seeds), DJD/ spondylosis,  mild dementia; followed by [name] [name] & [name] [name]... PLAN >>     We discussed pulmonary fibrosis and the evaluation- they are familiar w/ this dis since a church friend was recently diagnosed and has since passed away; they are understandably concerned and I pointed out some differences in [name] [name] (ie- he has had this on his CXR w/o much change since 2015);  We will proceed w/ Labs and Hi-res CT Chest => pending.  ADDENDUM>> Hi-res CT CHEST done 07/02/15>  Norm heart size, atherosclerosis of Ao/ coronaries including [place], no adenopathy or lung nodules; extensive subpleural reticulation, traction bronchiectasis, & architectural distortion bilaterally w/ basilar predominance, moderate honeycombing => all favors UIP diagnosis... I called pt & wife w/ report & they will think about options and f/u OV 07/11/15 to discuss further...   ~  July 11, 2015:  2wk ROV w/ [name] [name]>  As above I have prev discussed his results to date w/ pt & wife & layed out his options for treating his UIP/ IPF => specifically I outlined a 3-prong approach w/ Aggressive/ Middle-of-the-road/ Conservative options;  They have considered each of these approaches and discussed w/ their children => they have chosen the 2nd option for a middle-of-the-road approach, and in this regard they feel that additional collagen-vasc blood work would not add substantially to his treatment options, and they do not wish to pursue antifibrotic therapy w/ [name] [name] or [name] [name];  We decided to start pt on a low dose of Pred 10mg /d and he is cautioned to elim salt from their diet, & watch caloric intake;  We plan ROV in 6-8 weeks w/ CXR & Labs at that time...   Ambulatory Oximetry 07/11/15>  O2sat=95% on RA at rest w/ pulse=74/min;  He ambulated 3 Laps (185'ea) w/ lowest O2sat=81% w/ pulse=103/min;  He qualifies for Home Oxygen (rec 2L/min w/ exercise) but he declines...  ONO => pending => this was NOT completed. IMP/PLAN>>  [name] [name] needs to start on HomeO2-POC for  oxygen therapy w/ activities but he is reluctant to do so & declines my recommendation to start O2 now; he notes that he is asymptomatic- plays golf 2d/wk, mows yard, walks, etc and denies SOB, wife is not so sure (he is very stoic and has "mild cognitive impairment"); we decided to check the ONO & then proceed w/ Home O2 as indicated...  ~  August 27, 2015:  6wk ROV w/ [name] [name]>  [name] [name] returns or f/u of his IPF/ probUIP after starting o Pred10 last visit, and reports a good interval, as noted he is stoic & has mild cognitive impairment; he is currently taking PREDNISONE 10mg /d & tol well, has Home O2 at 2L/min (doesn't like it & compliance is fair); he continues to golf &  do yard work- he does not use the port O2 despite desat w/ exercise- he just takes his time & uses rest stops...    EXAM shows Afeb, VSS, O2sat=92% on RA;  HEENT- neg, mallampati2;  Chest- bibasilar velcro rales L>R ~1/3rd the way up post chest wall;  Heart- RR w/o m/r/g;  Abd- soft, nontender, neg;  Ext- neg w/o c/c/e...  CXR 08/27/15 (independently reviewed by me in the PACS system) showed norm heart size, tortuous Ao, stable bilat fibrotic changes, NAD...  LABS 08/27/15>  Chems- ok x HCO3=33;  CBC- ok x wbc=12.1K... IMP/PLAN>>  [name] [name] is stable on the [place] well; we wrote for a handicap placard; discussed asking [employer] company for a POC to encourage using oxygen w/ activities... we plan ROV in 82mo.  ~  December 03, 2015:  35mo ROV & [name] [name] notes that his breathing is doing satis, no real change in DOE etc but he has been coughing more, small amt yellow sput, no hemoptysis, no f/c/s, etc;  In the interval he had a UTI- seen in ER w/ fever & some confusion & treated w/ Keflex;  He saw [name] [name] Harriett Sine for f/u- Hx stroke on Plavix, mild cognitive impairment on Cerefolin, felt to be stable;  He also had allergy f/u DrWhelan- on Zyrtek & Astelin...    EXAM is unchanged- Afeb, VSS, O2sat=93% on RA;  HEENT- neg, mallampati2;  Chest- bibasilar velcro  rales L>R ~1/3rd the way up;  Heart- RR w/o m/r/g;  Abd- soft, nontender, neg;  Ext- neg w/o c/c/e... IMP/PLAN>>  Given 2017 Flu vaccine today, rec to add Delsym- 2tsp Bid for cough...  Pulm fibrosis- likely UIP, Exercise induced hypoxemia> continue Pred10, O2 at 2L/min Qhs & w/ exercise, low sodium low carb diet...  Cardiac issues- 83 y/o, HBP, ASPVD w/ bilat-CAEs (Left 2008, Right 2015), 4cm AAA, TIA/stroke> followed by [name] [name] & [name] [name].  Medical issues- as above + HL, prostate ca (s/p seeds), DJD/spondylosis, MCI/dementia> followed by [name] [name] & [name] [name].  ~  January 22, 2016:  6wk ROV & add-on appt requested for worsening cough>  [name] [name] presents c/o 3 wk hx cough- "a deep intense cough", intermittently prod of a small amt of yellow sput, some incr SOB noted due to the coughing paroxysms but he denies CP or f/c/s; of note the cough seems worse at night (but not every night);  He has been taking O2 at 2L/min Qhs & prn w/ exercise during the day, Pred10/d, GFN-400 2Bid w/ fluids, Delsym prn;  The episode sounds like an IPF exacerbation & we discussed Rx w/ Depo20, incr Pred to 20mg /d for 2wks then back to 10mg /d regular dose, LEVAQUIN 500mg x7d, Align daily & Hydromet cough syrup prn;  We also discussed an antireflux regimen for the nocturnal cough- Protonix40 before dinner, NPO after dinner in the eve, elev HOB 6"...    EXAM is unchanged- Afeb, VSS, O2sat=91% on RA;  HEENT- neg, mallampati2;  Chest- bibasilar velcro rales L>R ~1/3rd the way up (of note: pt did not cough once during the OV;  Heart- RR w/o m/r/g;  Abd- soft, nontender, neg;  Ext- neg w/o c/c/e... IMP/PLAN>>  This episode may have been triggered by an upper resp infection, nocturnal reflux and LPR, or an IPF exac-- rec treatment as above w/ O2, Depo/Pred bump, Levaquin, Mucinex, Hydromet, plus we initiated a vigorous antireflux protocol w/ PPI before dinner, NPO after dinner, elev HOB 6"... He has a regular sched OV in mid-Jan & we will  recheck  pt at that time...  ~  April 20, 2016:  40mo ROV & pulmonary follow up visit> pt states that he improved after the last visit w/ IPF exac, treated w/ Levaquin & Pred bump, & pt feels that he is back to his baseline; wife is not so sure w/ incr SOB while taking out the trash & simple chores (esp if he tries it w/o his oxygen);  He denies CP, palpit, and feels that his SOB is about the same w/ exertion; mild dry cough esp at night, no sput, no hemoptysis...    Pulm fibrosis- likely UIP, Hypoxemic respiratory failure> on O2 2L/min w/ exercise & Qhs, but encouraged to use it 24/7, and PRED 10mg  Qam; he has Zyrtek & Astelin for sinuses...     Cardiac issues- 83 y/o, HBP, ASPVD w/ bilat-CAEs (Left 2008, Right 2015), 4cm AAA, TIA/stroke> followed by [name] [name] & [name] [name].    Medical issues- as above + HL, prostate ca (s/p seeds), DJD/spondylosis, MCI/dementia> followed by [name] [name] & [name] [name].    EXAM shows Afeb, VSS, O2sat=94% on 3L pulse dose POC;  HEENT- neg, mallampati2;  Chest- bibasilar velcro rales L>R ~1/3rd the way up (of note: pt did not cough once during the OV);  Heart- RR w/o m/r/g;  Abd- soft, nontender, neg;  Ext- neg w/o c/c/e; Neuro- intact x mild memory impairment... IMP/PLAN>>  [name] [name] is +- stable and says he's doing OK; wife notes incr SOB w/ activities while not using the O2 eg- taking out the trash etc; he has a set routine & he seems to do ok; still drives some & she is asking for guidelines so we discussed not driving if S2GBT DV<76%; he recovered satis from the prev acute exac (using Levaquin & bump Pred) but new baseline is sl lower; Rec to continue the O2 w/ all activities, & the Pred 10mg /d; he will try to gradually incr exercise & expand his lungs; continue vigorous antireflux regimen which we reviewed today... we plan ROV in 14mo, sooner if needed prn.  ~  Jul 20, 2016:  21mo ROV & [name] [name] was HOSP by [facility]-DrCain 3/29 - 06/18/16 for a 5.9cm AAA w/ Aorto bi-iliac endovasc stent graft; he  did well & has recovered from the surg; they increased his O2 to 3L/min after the surg... We reviewed the following medical problems during today's office visit >>     Pulm fibrosis- likely UIP, Hypoxemic respiratory failure> on O2 3L/min w/ exercise & Qhs, but encouraged to use it 24/7, and PRED 10mg  Qam; he has Zyrtek & Astelin for sinuses...     Cardiac issues- 83 y/o, HBP, Cardiomyopathy w/ EF=45-50% & diffuse HK + Gr2DD (2DEcho 1/15), ASPVD w/ bilat-CAEs (Left 2008, Right 2015), 4cm AAA, TIA/stroke> followed by Youlanda Mighty; on ASA81/d...    Medical issues- as above + HL (on Cres5), prostate ca (s/p seeds), DJD/spondylosis, MCI/dementia> followed by [name] [name] & [name] [name]=> seen 05/04/16- hx stroke & MCI, signif bruising therefore stopped Plavix, cont ASA; continue Cerefolin & Resveratrol...    EXAM shows Afeb, VSS, O2sat=100% on 3L pulse dose POC;  HEENT- neg, mallampati2;  Chest- bibasilar velcro rales L>R ~1/3rd the way up (of note: pt did not cough once during the OV);  Heart- RR w/o m/r/g;  Abd- soft, nontender, neg;  Ext- neg w/o c/c/e; Neuro- intact x mild memory impairment...  Ambulatory oximetry on 3L/min [place]>  O2sat=93% on 3L/min oxygen at rest w/ pulse=63/min;  He ambulated 2 Laps (185'ea) & O2 nadir at peak exercise  was 73% w/ pulse=105/min... Rec to incr O2 to 4-5L transiently after exercise, then back to his baseline 3L/min...  LABS 07/20/16>  Chems- wnl;  CBC- ok w/ Hg=12.8, WBC=12K;  TSH=3.94;   IMP/PLAN>>  [name] [name] has gone thru major surg w/ Ao/Bi-iliac stent graft;  His IPF has deteriorated & I wonder if her had an exacerbation during this time? Now on 3L/min & we have maintained Pred 10mg /d...  ~  October 21, 2016:  24mo ROV & pulm follow up> [name] [name] doesn't feel that the 3/18 surg caused any set-back but wife thinks that he is sl worse;  Pt states that O2 sats are "good" in the 90's on 3L/min pulse-dose; he denies SOB! But has difficulty taking out the trash cans, says he golfs some w/ O2  in the cart & uses it prn;  He notes sl cough, min beige sput, no hemoptysis, no f/c/s, no CP, etc...     He saw VascSurg- DrCain 07/23/16>  Hx bilat CAE's and endovasc repair of AAA (3/18- Aorto Bi-iliac endovascular stent graft);  CT Angio Abd & Pelvis 07/20/16 reviewed- patent aortic stent graft, small endoleak visualized & they are following, Scan also shows severe anterolisthesis at L5-S1, extensive diverticulosis, interstitial fibrosis at lung bases...    Pulm fibrosis- likely UIP, Hypoxemic respiratory failure> on O2 3L/min w/ exercise & Qhs, but encouraged to use it 24/7, and PRED 10mg  Qam; he has Zyrtek & Astelin for sinuses...     Cardiac issues- 83 y/o, HBP, Cardiomyopathy w/ EF=45-50% & diffuse HK + Gr2DD (2DEcho 1/15), ASPVD w/ bilat-CAEs (Left 2008, Right 2015), 4cm AAA, TIA/stroke> followed by Youlanda Mighty; on ASA81/d...    Medical issues- as above + HL (on Cres5), prostate ca (s/p seeds), DJD/spondylosis, MCI/dementia> followed by [name] [name] & [name] [name]=> seen 05/04/16- hx stroke & MCI, signif bruising therefore stopped Plavix, cont ASA; continue Cerefolin & Resveratrol...    EXAM shows Afeb, VSS, O2sat=94% on 3L pulse dose POC;  HEENT- neg, mallampati2;  Chest- bibasilar velcro rales L>R ~1/2 the way up (of note: pt did not cough once during the OV);  Heart- RR w/o m/r/g;  Abd- soft, nontender, neg;  Ext- neg w/o c/c/e; Neuro- intact x mild memory impairment...  CXR 10/21/16 (independently reviewed by me in the PACS system) showed stable cardiomeg, diffuse bilat interstitial prom & elev left hemidiaph, Tspine scoliosis, no acute changes...  IMP/PLAN>>  [name] [name] is stable w/ his severe disease- pulm, cardiovasc, etc;  Continue same meds & we plan routine recheck 43mo...   ~  February 22, 2017:  71mo ROV & pt reports doing about the same- he remains on 3L/min O2, mild cough w/ clear phlegm, & he thinks breathing is better but wife says no; his CC is DOE while carrying a load, denies CP & he indicates  that [place] said he was in great shape, [facility]-DrCain noted small leak (low risk), [name] [name] rec to use Luminosity, PCP-Dr. Kelton Pillar did labs & they were ok... We reviewed the following interval Epic notes>      He saw NEURO- [name] [name] 12/01/16>  Mild cognitive impairment, hx stroke 03/2013 (CT/MRI showed subacute lacunar infarct in R-internal capsule region, 2DEcho w/ 45-50% EF w/ diffuse HK, & CDoppler w/ 80-99% R-ICA stenosis=> [facility]);  Short term memory deficits felt to be stable, on ASA, cerefolin & reservatrol + mentally challenging exercises, and memory compensation strategies...    He saw [facility]- DrNishan 12/16/16>  Hx CAD & ASPVD- s/p bilat CAE & EVAR  of AAA, nonischemic myoview 05/2013 w/ EF=56%; he was felt to be stable, no change in meds, f/u planned 29yr...    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     He says that he is due for a shot in his SI joint for LBP... We reviewed the following medical problems during today's office visit>      Pulm fibrosis- likely UIP w/ Hypoxemic respiratory failure> on O2 3L/min w/ exercise & Qhs, but encouraged to use it 24/7, and PRED 10mg  Qam; he has Zyrtek & Astelin for sinuses...     Cardiac issues- 83 y/o, HBP, Cardiomyopathy w/ EF=45-50% & diffuse HK + Gr2DD (2DEcho 1/15), ASPVD w/ bilat-CAEs (Left 2008, Right 2015), TIA/stroke, 5.9cm AAA- s/p endovasc surg 3/18> followed by [name] [name] & [name] [name]/ Donzetta Matters; on ASA81/d and monitored carefully w/ seriasl CT scan due to endoleak...Marland KitchenMarland KitchenMarland Kitchen    Medical issues- as above + HL (on Cres5), prostate ca (s/p seeds), DJD/spondylosis, MCI/dementia> followed by [name] [name] & [name] [name]=> seen regularly- hx stroke & MCI, signif bruising therefore stopped Plavix, cont ASA; continue Cerefolin & Resveratrol...    EXAM shows Afeb, VSS, O2sat=94% on 3L  pulse dose POC;  HEENT- neg, mallampati2;  Chest- bibasilar velcro rales L>R ~1/2 the way up (of note: pt did not cough once during the OV);  Heart- RR w/o m/r/g;  Abd- soft, nontender, neg;  Ext- neg w/o c/c/e; Neuro- intact x mild memory impairment...  CT Angio of Abd/Pelvis 02/03/17>  IMPRESSION:  VASCULAR-- 1. Stable aneurysm sac size of 5.1 x 5.9 cm. Mild type 2 endoleak remain suspected based on increased density within the inferior right aspect of the aneurysm sac on delayed imaging. Outflow remain suspected via the accessory lower pole right renal artery based on delayed perfusion of the right lower kidney. Inflow is more difficult to assess on the current study due to significant respiratory motion on the arterial phase scan. Lumbar inflow remains the most likely source of type 2 endoleak.  2. Mild aneurysmal disease of the native proximal aorta remains stable measuring 3.3 cm in greatest diameter just below the SMA origin.   NON-VASCULAR-- Stable fibrotic lung disease at the lung bases and severe degenerative disc disease at L5-S1 with associated anterolisthesis. IMP/PLAN>>  [name] [name] is overall stable w/ his severe atherosclerotic vasc dis, mild cognitive impairment/ vasc dementia, and pulm fibrosis on O3 at [place];  We decided to continue same, gave him a Rx for  ZPak prn use this winter, & rec to stay active, call for any problems...     Past Medical History:  Diagnosis Date  . AAA (abdominal aortic aneurysm) ([place])   . Allergy   . Carotid artery stenosis   . Diplopia   . GERD (gastroesophageal reflux disease)   . Hyperlipidemia   . Prostate cancer ([place])   . Pulmonary fibrosis ([place])   . Spondylisthesis 2015   Spine  . Stroke ([place]) 04/18/13   some weakness left-returning strength  . TIA (transient ischemic attack)     Past Surgical History:  Procedure Laterality Date  . ABDOMINAL AORTIC ENDOVASCULAR STENT GRAFT N/A 06/17/2016   Procedure: ABDOMINAL AORTIC ENDOVASCULAR STENT  GRAFT;  Surgeon: Waynetta Sandy, MD;  Location: [place];  Service: Vascular;  Laterality: N/A;  . CAROTID ENDARTERECTOMY  Sept. 2008  Elective left carotid endarterectomy  . CAROTID ENDARTERECTOMY Right 04/27/2013   CE  . ENDARTERECTOMY Right 04/27/2013   Procedure: ENDARTERECTOMY CAROTID;  Surgeon: Mal Misty, MD;  Location: [place];  Service: Vascular;  Laterality: Right;  . EYE SURGERY     Bilateral cataract   . FOOT SURGERY      left Achilles Tendon Repair  . seed implantation  2001   for prostate ca    Outpatient Encounter Medications as of 02/22/2017  Medication Sig  . acetaminophen (TYLENOL) 500 MG tablet Take 500 mg by mouth every 6 (six) hours as needed for mild pain.  Marland Kitchen aspirin EC 81 MG tablet Take 81 mg by mouth daily.  Marland Kitchen azelastine (ASTELIN) 0.1 % nasal spray Place 2 sprays into both nostrils 2 (two) times daily.   . cetirizine (ZYRTEC) 10 MG tablet Take 10 mg by mouth daily as needed for allergies.   Marland Kitchen dextromethorphan-guaiFENesin (MUCINEX DM) 30-600 MG 12hr tablet Take 1 tablet by mouth 2 (two) times daily.  . diclofenac (VOLTAREN) 50 MG EC tablet   . L-Methylfolate-B12-B6-B2 (CEREFOLIN) 08-20-48-5 MG TABS Take 1 tablet by mouth daily.  . Multiple Vitamins-Minerals (CENTRUM SILVER PO) Take 1 tablet by mouth daily.   . naproxen sodium (ANAPROX) 220 MG tablet Take 220 mg by mouth daily as needed (pain).  . Omega-3 Fatty Acids (FISH OIL PO) Take 1 capsule by mouth daily.  . pantoprazole (PROTONIX) 40 MG tablet TAKE 1 TABLET DAILY,30 MINUTES BEFORE DINNER.  Marland Kitchen predniSONE (DELTASONE) 10 MG tablet TAKE 1 TABLET EVERY DAY WITH BREAKFAST.  Marland Kitchen Resveratrol 100 MG CAPS Take 100 mg by mouth every evening.   . rosuvastatin (CRESTOR) 5 MG tablet Take 5 mg by mouth at bedtime.  Marland Kitchen azithromycin (ZITHROMAX) 250 MG tablet Take as directed.   Facility-Administered Encounter Medications as of 02/22/2017  Medication  . aspirin EC tablet 81 mg    Allergies  Allergen Reactions  .  Sudafed [Pseudoephedrine Hcl]     Causes dizziness and nervousness  . Atorvastatin Other (See Comments)    Reaction: Patient's family reports "confusion" and would prefer crestor     Immunization History  Administered Date(s) Administered  . Influenza, High Dose Seasonal PF 12/03/2015, 12/23/2016  . Influenza-Unspecified 12/21/2014  . Pneumococcal Conjugate-13 02/28/2014  . Pneumococcal Polysaccharide-23 03/23/1999    Current Medications, Allergies, Past Medical History, Past Surgical History, Family History, and Social History were reviewed in [employer] record.   Review of Systems             All symptoms NEG except where BOLDED >>  Constitutional:  F/C/S, fatigue, anorexia, unexpected weight change. HEENT:  HA, visual changes, hearing loss, earache, nasal symptoms, sore throat, mouth sores, hoarseness. Resp:  cough, sputum, hemoptysis; SOB, tightness, wheezing. Cardio:  CP, palpit, DOE, orthopnea, edema. GI:  N/V/D/C, blood in stool; reflux, abd pain, distention, gas. GU:  dysuria, freq, urgency, hematuria, flank pain, voiding difficulty. MS:  joint pain, swelling, tenderness, decr ROM; neck pain, back pain, etc. Neuro:  HA, tremors, seizures, dizziness, syncope, weakness, numbness, gait abn. Skin:  suspicious lesions or skin rash. Heme:  adenopathy, bruising, bleeding. Psyche:  Memory loss, confusion, agitation, sleep disturbance, hallucinations, anxiety, depression, suicidal.   Objective:   Physical Exam       Vital Signs:  Reviewed...   General:  WD, WN, 83 y/o WM in NAD; alert & oriented; pleasant & cooperative... HEENT:  [place]/AT; Conjunctiva- pink, Sclera- nonicteric, EOM-wnl, PERRLA, EACs-clear, TMs-wnl; NOSE-clear;  THROAT-clear & wnl.  Neck:  Supple w/ fair ROM; no JVD; normal carotid impulses w/o bruits; no thyromegaly or nodules palpated; no lymphadenopathy.  Chest:  Bibasilar crackles ~1/3rd way up the back;  No wheezing, rhonchi, signs of  consolidation... Heart:  Regular Rhythm; norm S1 & S2 without murmurs, rubs, or gallops detected. Abdomen:  Soft & nontender- no guarding or rebound; normal bowel sounds; no organomegaly or masses palpated. Ext:  Normal ROM; without deformities or arthritic changes; no varicose veins, venous insuffic, or edema;  Pulses intact w/o bruits. Neuro:  No focal neuro deficits; sensory testing normal; gait normal & balance OK. Derm:  No lesions noted; no rash etc. Lymph:  No cervical, supraclavicular, axillary, or inguinal adenopathy palpated.   Assessment:      IMP >>     Interstitial Lung Disease-- UIP/ IPF w/ characteristic Hi-res CT Chest findings and collagen-vasc labs showing elev Rheum Factor>600 & Sed=34, otherw neg Anti-CCP/ ANA/ & CRP    CARDIAC issues>  72 y/o, HBP, vasc dis w/ bilat carotid dis (s/p bilat CAEs) & AAA (5.9cm- s/p endovasc repair 05/2016 by Loel Dubonnet), TIA/ stroke, HL; followed by [name] [name] & [name] [name]/ Donzetta Matters...    MEDICAL issues>  55 y/o, HBP, vasc dis w/ bilat carotid dis (s/p bilat CAEs) & AAA, TIA/ stroke, HL, prostate ca (s/p seeds), DJD/ spondylosis, mild dementia; followed by [name] [name] & [name] [name]...  PLAN >>  06/24/15>   We discussed pulmonary fibrosis and the evaluation- they are familiar w/ this dis since a church friend was recently diagnosed and has since passed away; they are understandably concerned and I pointed out some differences in [name] (ie- he has had this on his CXR w/o much change since 2015);  We will proceed w/ Labs and Hi-res CT Chest... 07/11/15>   Pt & family have opted for a middle-of-the-road approach and declined further testing & Antifibrotic therapy; he appears to need O2 rx w/ activities now but is reluctant to accept this (we will check ONO & make final recommendation);  Plus we will give him a trial of Pred 10mg /d... 08/27/15>   [name] [name] is stable on the [place] well; we wrote for a handicap placard; discussed asking [employer] for  a POC to encourage using oxygen w/ activities. 12/03/15>   Stable IPF/UIP on Pred10 + home O2, continue same; given 2017 Flu vaccine today, rec to add Delsym- 2tsp Bid for cough... 02/10/16>   This episode may have been triggered by an upper resp infection, nocturnal reflux and LPR, or an IPF exac-- rec treatment as above w/ O2, Depo/Pred bump, Levaquin, Mucinex, Hydromet, plus we initiated a vigorous antireflux protocol w/ PPI before dinner, NPO after dinner, elev HOB 6". 07/20/16>   [name] [name] has gone thru major surg w/ Ao/Bi-iliac stent graft;  His IPF has deteriorated & I wonder if her had an exacerbation during this time? Now on 3L/min 7 we have maintained Pred 10mg /d.. 10/21/16>   Stable- continue same meds. 02/22/17>   [name] [name] is overall stable w/ his severe atherosclerotic vasc dis, mild cognitive impairment/ vasc dementia, and pulm fibrosis on O3 at [place];  We decided to continue same, gave him a Rx for  ZPak prn use this winter, & rec to stay active, call for any problems.    Plan:     CONTINUE HOME OXYGEN at 3L/min regularly...    Medication List        Accurate as of 02/22/17  4:11 PM. Always use  your most recent med list.          acetaminophen 500 MG tablet Commonly known as:  TYLENOL   aspirin EC 81 MG tablet   azelastine 0.1 % nasal spray Commonly known as:  ASTELIN   azithromycin 250 MG tablet Commonly known as:  ZITHROMAX Take as directed.   CENTRUM SILVER PO   CEREFOLIN 08-20-48-5 MG Tabs Take 1 tablet by mouth daily.   cetirizine 10 MG tablet Commonly known as:  ZYRTEC   dextromethorphan-guaiFENesin 30-600 MG 12hr tablet Commonly known as:  MUCINEX DM   diclofenac 50 MG EC tablet Commonly known as:  VOLTAREN   FISH OIL PO   naproxen sodium 220 MG tablet Commonly known as:  ALEVE   pantoprazole 40 MG tablet Commonly known as:  PROTONIX TAKE 1 TABLET DAILY,30 MINUTES BEFORE DINNER.   predniSONE 10 MG tablet Commonly known as:  DELTASONE TAKE 1  TABLET EVERY DAY WITH BREAKFAST.   Resveratrol 100 MG Caps   rosuvastatin 5 MG tablet Commonly known as:  CRESTOR       Where to Get Your Medications    These medications were sent to [facility], [address] [place] [address]   Phone:  [phone]   azithromycin 250 MG tablet

## 2017-02-22 NOTE — Patient Instructions (Signed)
Today we updated your med list in our EPIC system...    Continue your current medications the same...  Continue your oxygen 7 current meds the same for now...  We wrote for [facility]) to use as needed for an upper resp infection if needed this winter...  Call for any questions...  Let's plan a follow up visit in 66mo, sooner if needed for problems.Marland KitchenMarland Kitchen

## 2017-03-11 ENCOUNTER — Other Ambulatory Visit: Payer: Self-pay | Admitting: Pulmonary Disease

## 2017-03-18 DIAGNOSIS — I2510 Atherosclerotic heart disease of native coronary artery without angina pectoris: Secondary | ICD-10-CM | POA: Diagnosis not present

## 2017-03-18 DIAGNOSIS — J84112 Idiopathic pulmonary fibrosis: Secondary | ICD-10-CM | POA: Diagnosis not present

## 2017-03-29 DIAGNOSIS — I63511 Cerebral infarction due to unspecified occlusion or stenosis of right middle cerebral artery: Secondary | ICD-10-CM | POA: Diagnosis not present

## 2017-03-29 DIAGNOSIS — I10 Essential (primary) hypertension: Secondary | ICD-10-CM | POA: Diagnosis not present

## 2017-03-29 DIAGNOSIS — Z0000 Encounter for general adult medical examination without abnormal findings: Secondary | ICD-10-CM | POA: Diagnosis not present

## 2017-03-29 DIAGNOSIS — I6521 Occlusion and stenosis of right carotid artery: Secondary | ICD-10-CM | POA: Diagnosis not present

## 2017-04-06 DIAGNOSIS — L570 Actinic keratosis: Secondary | ICD-10-CM | POA: Diagnosis not present

## 2017-04-06 DIAGNOSIS — L821 Other seborrheic keratosis: Secondary | ICD-10-CM | POA: Diagnosis not present

## 2017-04-06 DIAGNOSIS — Z85828 Personal history of other malignant neoplasm of skin: Secondary | ICD-10-CM | POA: Diagnosis not present

## 2017-04-06 DIAGNOSIS — D692 Other nonthrombocytopenic purpura: Secondary | ICD-10-CM | POA: Diagnosis not present

## 2017-04-11 ENCOUNTER — Other Ambulatory Visit: Payer: Self-pay | Admitting: Pulmonary Disease

## 2017-04-13 ENCOUNTER — Other Ambulatory Visit: Payer: Self-pay | Admitting: Pulmonary Disease

## 2017-04-18 DIAGNOSIS — J84112 Idiopathic pulmonary fibrosis: Secondary | ICD-10-CM | POA: Diagnosis not present

## 2017-04-18 DIAGNOSIS — I2510 Atherosclerotic heart disease of native coronary artery without angina pectoris: Secondary | ICD-10-CM | POA: Diagnosis not present

## 2017-05-08 ENCOUNTER — Other Ambulatory Visit: Payer: Self-pay | Admitting: Pulmonary Disease

## 2017-05-17 ENCOUNTER — Other Ambulatory Visit: Payer: Self-pay

## 2017-05-17 ENCOUNTER — Other Ambulatory Visit: Payer: Self-pay | Admitting: Pulmonary Disease

## 2017-05-17 MED ORDER — CEREFOLIN 6-1-50-5 MG PO TABS: 1.0000 | ORAL_TABLET | Freq: Every day | ORAL | 3 refills | Status: DC

## 2017-05-19 DIAGNOSIS — I2510 Atherosclerotic heart disease of native coronary artery without angina pectoris: Secondary | ICD-10-CM | POA: Diagnosis not present

## 2017-05-19 DIAGNOSIS — J84112 Idiopathic pulmonary fibrosis: Secondary | ICD-10-CM | POA: Diagnosis not present

## 2017-05-30 NOTE — Progress Notes (Signed)
[facility]  PATIENT: [name] [name] DOB: 06/17/1929   REASON FOR VISIT: Follow-up for history of stroke, mild cognitive impairment  HISTORY FROM: Patient and wife   HISTORY OF PRESENT ILLNESS: 87 year Caucasian male seen for first office followup visit from hospital consideration for stroke in January 2015. He presented with difficulty picking things up with his left hand as well as grip weakness while playing golf. He also subsequently diescribed, headache and some speech and language difficulties and double vision. His symptoms improved soon after arrival and hence he was not considered for thrombolysis. CT scan of the head showed an area of low attenuation in the right anterior limb internal capsule suggestive of a subacute lacunar infarct which was subsequently confirmed on MRI . 2D echo showed ejection fraction of 45-50% with diffuse hypokinesis. Carotid Doppler showed progressive 80-99% right ICA stenosis and 1-39% left ICA stenosis at the previous surgical site. Vascular surgery was consulted . Patient was started on aspirin. Patient was scheduled for and underwent elective right carotid endarterectomy by Dr Kellie Simmering and the procedure went well. Patient was placed on Plavix by me but after the surgery was changed to aspirin. He states his blood pressure is fine and is 103/65 today and it usually runs in the 120s at home. The patient's wife has noted that he has subacute decline in his memory as well as multitasking and cognitive abilities. She is concerned about this and would like evaluation. The patient himself denies this. He has not had an evaluation for these complaints before.   UPDATE 08/27/13 (LL): Since last visit, patient has continued taking Cerefolin NAC for memory. He does not notice any change in his memory, but states he knows his short term memory is not as good as it used to be. He is tolerating Plavix well with no signs of significant bleeding or bruising. His  blood pressure is well controlled, it is 125/79 in the office today. He states he stays active, plays golf a couple days a week. Update 05/10/2014 PShe returns for follow-up accompanied by his wife. He states his memory difficulties are unchanged. He in fact is doing well and is still able to handle finances for the home and business. He is taking fish oil as well as Cerefolin everyday. The patient also remains on Plavix which is tolerating well without bleeding or bruising. He had lipid profile checked in December 2015 by primary physician which was fine. He also had follow-up carotid ultrasound done last week by Dr. Kellie Simmering which was okay as well. He exercises regularly and plays golf as well. He has no new complaints. He has not had any recurrent stroke or TIA symptoms. Update 11/05/2014:PS He returns for follow-up after last visit 6 months ago. He is accompanied by his wife. He states his short-term memory difficulties continue. He has occasional fecal times in judgment and multitasking. However overall his symptoms seem stable. He continues to take Cerefolin daily. He does participate in doing sudoku several times a week. He does play golf twice a week. He did have a follow-up carotid ultrasound checked in February and Dr. Evelena Leyden office and it was fine. He remains on Plavix which is tolerating well without bleeding or bruising. He has not had any recurrent stroke or TIA symptoms. He remains on Crestor without any myalgias or arthralgias. His blood pressure is elevated today in office at 150/93 but he blames this on white coat hypertension. He did have lipid profile checked 2 months ago by  Dr. Laurann Montana and it was fine.  UPDATE 05/06/2015 CMMr. [name], 84 year old male returns for follow-up. He is accompanied by his wife [name] [name]. He has not had further stroke or TIA symptoms since last seen. He is currently on Plavix with minimal bruising. His memory has not progressed since last seen. He continues to play golf  several times a week and participates in card games, and sudoku. He remains on Cerefolin. His lipids are followed by primary care and he remains on Crestor without side effects. Carotid Dopplers followed yearly by Dr. Kellie Simmering. He returns for reevaluation UPDATE 08/15/2017CM [name] [name], 84 year old returns for follow-up with his wife. He has a history of stroke in January 2015. He has not had further stroke or TIA symptoms since that time he is currently on Plavix with minimal bruising. His memory is stable, MMSE 27/30. He continues to play golf twice a week and participate in other activities to stimulate his mind. He remains on Cerefolin. He was recently diagnosed with pulmonary fibrosis and is using oxygen at night. He remains on Crestor and lipids are followed by primary care Dr. Laurann Montana.  Carotid Doppler is followed by [facility]. Doppler is due to February 2018. He returns for reevaluation Update 2/13/2018PS : He returns for follow-up after last visit 6 months ago. He is accompanied by his wife. He states his doing well. He has had no recurrent stroke or TIA symptoms now for 2 years. He is on Plavix but is having significant skin bruising. He in fact is currently having 2 areas that he has Band-Aids because of the bruising. Patient does have regular follow up with vascular surgeon for his aortic aneurysm as well as carotid surgery surveillance. He states her blood pressure is under good control and today it is 130/83. He recently had lipid profile checked on January 2 with LDL cholesterol was 63, HDL 77, triglycerides 116 and total cholesterol 163 mg percent. He has been diagnosed with pulmonary fibrosis and started on home oxygen at night only. He states he feels and in the day. Vistaril memory difficulties about the same. He remains on Cerefolin NAC and Reservatrol. He is mostly independent in history driving. He has short-term memory difficulties mostly. He has no other new complaints Update 12/01/2016 CM: He  returns for follow-up after last of that 6 months ago. He is accompanied by his wife. They feel his memory difficulties about unchanged. She still has mild short-term memory difficulties. He does keep himself busy in mentally challenging activities like doing sudoku and playing computer games. He is on home oxygen with pulmonary fibrosis which limits his physical activity. He has noticed diminished bruising after changing from Plavix to aspirin. He is had no recurrent stroke or TIA symptoms. He has no new complaints today. The patient has his wife has decided not to participate and early dementia studies at the present time hence they do not have the time UPDATE 3/12/2019CM Mr. Goyer, 84 year old male returns for follow-up with history of mild cognitive impairment and stroke event in 2015.  He is supposed to be on aspirin for secondary stroke prevention however he has not been taking this.  He has not had further stroke or TIA symptoms he had significant bruising on Plavix.  He remains on Crestor without myalgias.  Blood pressure in the office today 120/65.  His mild cognitive impairment is stable.  He also has a history of pulmonary fibrosis which limits his physical activity.  He is on oxygen therapy.  He tries to remain  physically active but tires easily.  He still does computer games etc.  He returns for reevaluation REVIEW OF SYSTEMS: Full 14 system review of systems performed and notable only for those listed, all others are neg:  Constitutional: neg  Cardiovascular: neg Ear/Nose/Throat: neg  Skin: neg Eyes: neg Respiratory: Shortness of breath on oxygen Gastroitestinal: neg  Hematology/Lymphatic: neg  Endocrine: neg Musculoskeletal:neg Allergy/Immunology: neg Neurological: Mild cognitive impairment, history of stroke Psychiatric: neg Sleep : neg   ALLERGIES: Allergies  Allergen Reactions  . Sudafed [Pseudoephedrine Hcl]     Causes dizziness and nervousness  . Atorvastatin Other (See  Comments)    Reaction: Patient's family reports "confusion" and would prefer crestor     HOME MEDICATIONS: Outpatient Medications Prior to Visit  Medication Sig Dispense Refill  . acetaminophen (TYLENOL) 500 MG tablet Take 500 mg by mouth every 6 (six) hours as needed for mild pain.    Marland Kitchen azelastine (ASTELIN) 0.1 % nasal spray Place 2 sprays into both nostrils 2 (two) times daily.   4  . cetirizine (ZYRTEC) 10 MG tablet Take 10 mg by mouth daily as needed for allergies.     Marland Kitchen dextromethorphan-guaiFENesin (MUCINEX DM) 30-600 MG 12hr tablet Take 1 tablet by mouth 2 (two) times daily.    . diclofenac (VOLTAREN) 50 MG EC tablet   0  . L-Methylfolate-B12-B6-B2 (CEREFOLIN) 08-20-48-5 MG TABS Take 1 tablet by mouth daily. 90 each 3  . Multiple Vitamins-Minerals (CENTRUM SILVER PO) Take 1 tablet by mouth daily.     . naproxen sodium (ANAPROX) 220 MG tablet Take 220 mg by mouth daily as needed (pain).    . Omega-3 Fatty Acids (FISH OIL PO) Take 1 capsule by mouth daily.    . pantoprazole (PROTONIX) 40 MG tablet TAKE 1 TABLET DAILY,30 MINUTES BEFORE DINNER. 30 tablet 1  . predniSONE (DELTASONE) 10 MG tablet TAKE 1 TABLET EVERY DAY WITH BREAKFAST. 30 tablet 0  . Resveratrol 100 MG CAPS Take 100 mg by mouth every evening.     . rosuvastatin (CRESTOR) 5 MG tablet Take 5 mg by mouth at bedtime.    Marland Kitchen azithromycin (ZITHROMAX) 250 MG tablet Take as directed. 6 tablet 0  . aspirin EC 81 MG tablet Take 81 mg by mouth daily.     Facility-Administered Medications Prior to Visit  Medication Dose Route Frequency Provider Last Rate Last Dose  . aspirin EC tablet 81 mg  81 mg Oral Daily Garvin Fila, MD        PAST MEDICAL HISTORY: Past Medical History:  Diagnosis Date  . AAA (abdominal aortic aneurysm) ([place])   . Allergy   . Carotid artery stenosis   . Diplopia   . GERD (gastroesophageal reflux disease)   . Hyperlipidemia   . Memory loss   . Prostate cancer ([place])   . Pulmonary fibrosis ([place])   .  Spondylisthesis 2015   Spine  . Stroke ([place]) 04/18/13   some weakness left-returning strength  . TIA (transient ischemic attack)     PAST SURGICAL HISTORY: Past Surgical History:  Procedure Laterality Date  . ABDOMINAL AORTIC ENDOVASCULAR STENT GRAFT N/A 06/17/2016   Procedure: ABDOMINAL AORTIC ENDOVASCULAR STENT GRAFT;  Surgeon: Waynetta Sandy, MD;  Location: [facility] OR;  Service: Vascular;  Laterality: N/A;  . CAROTID ENDARTERECTOMY  Sept. 2008   Elective left carotid endarterectomy  . CAROTID ENDARTERECTOMY Right 04/27/2013   CE  . ENDARTERECTOMY Right 04/27/2013   Procedure: ENDARTERECTOMY CAROTID;  Surgeon: Mal Misty, MD;  Location: [facility] OR;  Service: Vascular;  Laterality: Right;  . EYE SURGERY     Bilateral cataract   . FOOT SURGERY      left Achilles Tendon Repair  . seed implantation  2001   for prostate ca    FAMILY HISTORY: Family History  Problem Relation Age of Onset  . Stroke Mother   . Heart disease Mother        After age 71  . Varicose Veins Mother   . Hypertension Mother   . Cancer Mother        Theadora Rama   . Stroke Father   . Heart attack Father   . Deep vein thrombosis Father   . Anuerysm Father   . Heart disease Brother     SOCIAL HISTORY: Social History   Socioeconomic History  . Marital status: Married    Spouse name: lee  . Number of children: 2  . Years of education: college  . Highest education level: Not on file  Social Needs  . Financial resource strain: Not on file  . Food insecurity - worry: Not on file  . Food insecurity - inability: Not on file  . Transportation needs - medical: Not on file  . Transportation needs - non-medical: Not on file  Occupational History  . Occupation: retired    Fish farm manager: RETIRED    Comment: [employer]   Tobacco Use  . Smoking status: Former Smoker    Packs/day: 1.00    Years: 14.00    Pack years: 14.00    Types: Cigarettes    Last attempt to quit: 03/27/1965    Years since quitting:  52.2  . Smokeless tobacco: Never Used  . Tobacco comment: occ alcohol  Substance and Sexual Activity  . Alcohol use: Yes    Alcohol/week: 0.6 oz    Types: 1 Shots of liquor per week    Comment: 3-4 drinks week bourbon  . Drug use: No  . Sexual activity: Not Currently  Other Topics Concern  . Not on file  Social History Narrative  . Not on file     PHYSICAL EXAM  Vitals:   05/31/17 0834  BP: 120/65  Pulse: 83  Weight: 185 lb 3.2 oz (84 kg)   Body mass index is 26.57 kg/m.  Generalized: Well developed, in no acute distress  Head: normocephalic and atraumatic,. Oropharynx benign  Neck: Supple, no carotid bruits  Cardiac: Regular rate rhythm, no murmur  Musculoskeletal: No deformity   Neurological examination   Mentation: Alert AFT 7. Clock drawing 4/4 MMSE - Mini Mental State Exam 05/31/2017 12/01/2016 05/04/2016  Orientation to time 4 4 5   Orientation to Place 4 5 4   Registration 3 3 3   Attention/ Calculation 5 5 5   Recall 1 1 3   Language- name 2 objects 2 2 2   Language- repeat 1 1 1   Language- follow 3 step command 3 3 3   Language- read & follow direction 1 1 1   Write a sentence 1 1 1   Copy design 1 1 1   Total score 26 27 29    Follows all commands speech and language fluent.   Cranial nerve II-XII: Pupils were equal round reactive to light extraocular movements were full, visual field were full on confrontational test. Facial sensation and strength were normal. hearing was intact to finger rubbing bilaterally. Uvula tongue midline. head turning and shoulder shrug were normal and symmetric.Tongue protrusion into cheek strength was normal. Motor: normal bulk and tone, full strength in  the BUE, BLE, fine finger movements normal, no pronator drift. No focal weakness Sensory: normal and symmetric to light touch, pinprick, and  Vibration, in the upper and lower extremities Coordination: finger-nose-finger, heel-to-shin bilaterally, no dysmetria Reflexes: 1+ upper lower  and symmetric plantar responses were flexor bilaterally. Gait and Station: Rising up from seated position without assistance, normal stance,  moderate stride, good arm swing, smooth turning, able to perform tiptoe, and heel walking without difficulty. Tandem gait is steady.  No assistive device  DIAGNOSTIC DATA (LABS, IMAGING, TESTING) - I reviewed patient records, labs, notes, testing and imaging myself where available.  Lab Results  Component Value Date   WBC 12.0 (H) 07/20/2016   HGB 12.8 (L) 07/20/2016   HCT 38.6 (L) 07/20/2016   MCV 93.2 07/20/2016   PLT 213.0 07/20/2016      Component Value Date/Time   NA 139 07/20/2016 1301   K 3.6 07/20/2016 1301   CL 102 07/20/2016 1301   CO2 28 07/20/2016 1301   GLUCOSE 97 07/20/2016 1301   BUN 14 07/20/2016 1301   CREATININE 1.00 07/20/2016 1301   CALCIUM 9.2 07/20/2016 1301   PROT 7.7 07/20/2016 1301   ALBUMIN 3.9 07/20/2016 1301   AST 30 07/20/2016 1301   ALT 26 07/20/2016 1301   ALKPHOS 69 07/20/2016 1301   BILITOT 0.6 07/20/2016 1301   GFRNONAA >60 06/18/2016 0445   GFRAA >60 06/18/2016 0445     Lab Results  Component Value Date   TSH 3.94 07/20/2016      ASSESSMENT AND PLAN 28 year with right posterior frontal infarct in January 2015 secondary to symptomatic right ICA stenosis for which he has undergone elective right carotid endarterectomy. Multiple vascular risk factors of carotid stenosis, hyperlipidemia, peripheral vascular disease and history of TIA. He also has pre-existing memory loss which is likely mild cognitive impairment which appears stable.   PLAN: Continue aspirin for secondary stroke prevention Continue Cerefolin for mild cognitive impairment.   Continue Crestor for hyperlipidemia Continue to participate in mentally challenging activities like crossword puzzles, bridge Sudoku.  Discussed memory compensation strategies such as no multitasking, making a list etc. Try to be as active as  possible Follow-up in 6 months I spent 25 minutes in total face to face time with the patient more than 50% of which was spent counseling and coordination of care, reviewing test results reviewing medications and discussing and reviewing the diagnosis of stroke and management of risk factors along with mild cognitive impairment and compensation strategies Dennie Bible, [facility], [facility], APRN  [facility] [address], [place], [place] [address] [phone]

## 2017-05-31 ENCOUNTER — Ambulatory Visit: Payer: Medicare Other | Admitting: Nurse Practitioner

## 2017-05-31 ENCOUNTER — Encounter: Payer: Self-pay | Admitting: Nurse Practitioner

## 2017-05-31 VITALS — BP 120/65 | HR 83 | Wt 185.2 lb

## 2017-05-31 DIAGNOSIS — Z8673 Personal history of transient ischemic attack (TIA), and cerebral infarction without residual deficits: Secondary | ICD-10-CM | POA: Diagnosis not present

## 2017-05-31 DIAGNOSIS — E785 Hyperlipidemia, unspecified: Secondary | ICD-10-CM | POA: Diagnosis not present

## 2017-05-31 DIAGNOSIS — G3184 Mild cognitive impairment, so stated: Secondary | ICD-10-CM | POA: Diagnosis not present

## 2017-05-31 NOTE — Patient Instructions (Signed)
Continue aspirin for secondary stroke prevention Continue Cerefolin for mild cognitive impairment.   Continue Crestor for hyperlipidemia Continue to participate in mentally challenging activities like crossword puzzles, bridge Sudoku. Try to be as active as possible Follow-up in 6 months

## 2017-05-31 NOTE — Progress Notes (Signed)
I agree with the above plan 

## 2017-06-11 ENCOUNTER — Other Ambulatory Visit: Payer: Self-pay | Admitting: Pulmonary Disease

## 2017-06-16 DIAGNOSIS — I2510 Atherosclerotic heart disease of native coronary artery without angina pectoris: Secondary | ICD-10-CM | POA: Diagnosis not present

## 2017-06-16 DIAGNOSIS — J84112 Idiopathic pulmonary fibrosis: Secondary | ICD-10-CM | POA: Diagnosis not present

## 2017-06-23 ENCOUNTER — Ambulatory Visit: Payer: Medicare Other | Admitting: Pulmonary Disease

## 2017-06-23 ENCOUNTER — Encounter: Payer: Self-pay | Admitting: Pulmonary Disease

## 2017-06-23 VITALS — BP 122/70 | HR 74 | Temp 97.6°F | Ht 70.0 in | Wt 183.4 lb

## 2017-06-23 DIAGNOSIS — J9611 Chronic respiratory failure with hypoxia: Secondary | ICD-10-CM

## 2017-06-23 DIAGNOSIS — Z9889 Other specified postprocedural states: Secondary | ICD-10-CM | POA: Diagnosis not present

## 2017-06-23 DIAGNOSIS — I714 Abdominal aortic aneurysm, without rupture, unspecified: Secondary | ICD-10-CM

## 2017-06-23 DIAGNOSIS — I2510 Atherosclerotic heart disease of native coronary artery without angina pectoris: Secondary | ICD-10-CM

## 2017-06-23 DIAGNOSIS — I6523 Occlusion and stenosis of bilateral carotid arteries: Secondary | ICD-10-CM | POA: Diagnosis not present

## 2017-06-23 DIAGNOSIS — J84112 Idiopathic pulmonary fibrosis: Secondary | ICD-10-CM | POA: Diagnosis not present

## 2017-06-23 DIAGNOSIS — G3184 Mild cognitive impairment, so stated: Secondary | ICD-10-CM

## 2017-06-23 DIAGNOSIS — Z8673 Personal history of transient ischemic attack (TIA), and cerebral infarction without residual deficits: Secondary | ICD-10-CM

## 2017-06-23 DIAGNOSIS — Z95828 Presence of other vascular implants and grafts: Secondary | ICD-10-CM | POA: Diagnosis not present

## 2017-06-23 NOTE — Progress Notes (Signed)
Subjective:     Patient ID: [name] [name], male   DOB: Jul 08, 1929, 84 y.o.   MRN: [id number]  HPI 84 y/o WF w/ mult co-morbidities followed for pulm fibrosis (IPF/UIP) and hypoxemic resp insufficiency- on Prednisone and Home O2...  ~  June 24, 2015:  Initial pulmonary consult by SN>  The Britts are friends/ church members with the [name]... 84 y/o WM referred by Dr.[name] for further evaluation & treatment of pulmonary fibrosis>  He recently had a vasc surg follow up visit where they noted crackles in the lung bases and referred him back to [name];  He c/o a slight breathing problem w/ SOB/DOE after mowing the yard for 40 min, climbing hills, playing golf but no real change for years he says (stable- not progressive);  He has noted a slight dry cough x1-28yrs and actually saw ENT for eval post nasal drip/ congestion but no better after sinusitis treatment;  Then he saw Allergist- neg testing, no allergies found;  He has occas beige/yellow sput production, denies hx bronchitis/ recurrent infections, and no f/c/s;  He was seen by VVS for f/u CDopplers (they remain patent s/p bilat CAEs) and they noted crackles at L>R lung base;  Pt rechecked by [name] w/ similar findings and she rechecked CXR & PFT (see below) => refer to pulmonary...   Smoking Hx>  He is an ex-smoker; started around age 84, smoked for ~49yrs but always <1ppd; Quit smoking 1966 w/ a 10 pack-year smoking hx...  Pulmonary Hx>  He denies any hx asthma, does not recall treatment for bronchits, never had pneumonia, no hx TB or known exposure;  He has been treated for sinus infections.  Medical Hx>  HBP, vasc dis w/ carotid dis (s/p bilat CAEs) & AAA, TIA/ stroke, HL, prostate ca (s/p seeds), DJD/ spondylosis, mild dementia  Family Hx>  There is no family hx of lung disease;  Father was a smoker & had a heart attack & stroke.  Occup Hx>  He was the Psychologist, educational for [employer]- no known exposure to  asbestos, silica dust, other inorganic or organic dusts/ materials...  Current Meds>  Astelin/ Atrovent nasal, Zyrtek, Plavix, Resveratrol, Crestor, Fish Oil, Vits... EXAM shows Afeb, VSS, O2sat=92% on RA;  HEENT- neg, mallampati2;  Chest- bibasilar velcro rales L>R ~1/3rd the way up post chest wall;  Heart- RR w/o m/r/g;  Abd- soft, nontender, neg;  Ext- neg w/o c/c/e...  CT Angio Abd done 01/02/08>  Chronic interstitial changes noted at the lung bases bilat; fusiform infrarenal AAA at 3.5cm, mod ostial dis of celiac axis origin...   Old CXR 08/15/08 showed norm heart size, patchy opac at the left lung base, otherw neg...  Old CXR 04/18/13 showed norm heart size, sl tortuous Ao, diffuse incr in interstitial markings L>R...  Recent CXR 05/21/15>  Heart at upper lim of normal, prominent coarse interstitial markings bilat L>R, no focal abn & unchanged from 2015 film...  Full PFT 05/29/15> FVC=2.83 (73%), FEV1=2.48 (91%), %1sec=87, mid-flows wnl at 155% predicted; TLC=4.97 (70%), RV=2.08 (76%), RV/TLC=42%; DLCO=23% predicted; c/w a mild restrictive ventilatory defect & severe decr DLCO.   Ambulatory Oximetry 06/24/15>  O2sat=100% on RA at rest w/ pulse=59/min;  He ambulated only 2 Laps in office w/ lowest O2sat=86% w/ pulse=91/min... He indicated that he does NOT want oxygen at this point.  LABS 06/24/15>  Chems- wnl w/ Cr=0.96;  CBC- wnl w/ Hg=13.8;  TSH=4.55;  Sed=34;  CRP=0.8;  RF>600;  CCP=<16;  ANA=neg  CXR 04/18/13                                          CXR 05/21/15     IMP >>     Interstitial Lung Disease-- likely IPF and we will proceed w/ His-res CT Chest, & Preliminary LAB screen...    CARDIAC issues>  53 y/o, HBP, vasc dis w/ bilat carotid dis (s/p bilat CAEs) & AAA (4.1cm), TIA/ stroke, HL; followed by [name] & [name]...    MEDICAL issues>  89 y/o, HBP, vasc dis w/ bilat carotid dis (s/p bilat CAEs) & AAA, TIA/ stroke, HL, prostate ca (s/p seeds), DJD/ spondylosis,  mild dementia; followed by [name] & [name]... PLAN >>     We discussed pulmonary fibrosis and the evaluation- they are familiar w/ this dis since a church friend was recently diagnosed and has since passed away; they are understandably concerned and I pointed out some differences in [name] (ie- he has had this on his CXR w/o much change since 2015);  We will proceed w/ Labs and Hi-res CT Chest => pending.  ADDENDUM>> Hi-res CT CHEST done 07/02/15>  Norm heart size, atherosclerosis of Ao/ coronaries including [place], no adenopathy or lung nodules; extensive subpleural reticulation, traction bronchiectasis, & architectural distortion bilaterally w/ basilar predominance, moderate honeycombing => all favors UIP diagnosis... I called pt & wife w/ report & they will think about options and f/u OV 07/11/15 to discuss further...   ~  July 11, 2015:  2wk ROV w/ SN>  As above I have prev discussed his results to date w/ pt & wife & layed out his options for treating his UIP/ IPF => specifically I outlined a 3-prong approach w/ Aggressive/ Middle-of-the-road/ Conservative options;  They have considered each of these approaches and discussed w/ their children => they have chosen the 2nd option for a middle-of-the-road approach, and in this regard they feel that additional collagen-vasc blood work would not add substantially to his treatment options, and they do not wish to pursue antifibrotic therapy w/ Ofev or Esbriet;  We decided to start pt on a low dose of Pred 10mg /d and he is cautioned to elim salt from their diet, & watch caloric intake;  We plan ROV in 6-8 weeks w/ CXR & Labs at that time...   Ambulatory Oximetry 07/11/15>  O2sat=95% on RA at rest w/ pulse=74/min;  He ambulated 3 Laps (185'ea) w/ lowest O2sat=81% w/ pulse=103/min;  He qualifies for Home Oxygen (rec 2L/min w/ exercise) but he declines...  ONO => pending => this was NOT completed. IMP/PLAN>>  [name] [name] needs to start on HomeO2-POC for  oxygen therapy w/ activities but he is reluctant to do so & declines my recommendation to start O2 now; he notes that he is asymptomatic- plays golf 2d/wk, mows yard, walks, etc and denies SOB, wife is not so sure (he is very stoic and has "mild cognitive impairment"); we decided to check the ONO & then proceed w/ Home O2 as indicated...  ~  August 27, 2015:  6wk ROV w/ SN>  [name] [name] returns or f/u of his IPF/ probUIP after starting o Pred10 last visit, and reports a good interval, as noted he is stoic & has mild cognitive impairment; he is currently taking PREDNISONE 10mg /d & tol well, has Home O2 at 2L/min (doesn't like it & compliance is fair); he continues to golf &  do yard work- he does not use the port O2 despite desat w/ exercise- he just takes his time & uses rest stops...    EXAM shows Afeb, VSS, O2sat=92% on RA;  HEENT- neg, mallampati2;  Chest- bibasilar velcro rales L>R ~1/3rd the way up post chest wall;  Heart- RR w/o m/r/g;  Abd- soft, nontender, neg;  Ext- neg w/o c/c/e...  CXR 08/27/15 (independently reviewed by me in the PACS system) showed norm heart size, tortuous Ao, stable bilat fibrotic changes, NAD...  LABS 08/27/15>  Chems- ok x HCO3=33;  CBC- ok x wbc=12.1K... IMP/PLAN>>  [name] [name] is stable on the [place] well; we wrote for a handicap placard; discussed asking [employer] company for a POC to encourage using oxygen w/ activities... we plan ROV in 82mo.  ~  December 03, 2015:  35mo ROV & [name] [name] notes that his breathing is doing satis, no real change in DOE etc but he has been coughing more, small amt yellow sput, no hemoptysis, no f/c/s, etc;  In the interval he had a UTI- seen in ER w/ fever & some confusion & treated w/ Keflex;  He saw [name] Harriett Sine for f/u- Hx stroke on Plavix, mild cognitive impairment on Cerefolin, felt to be stable;  He also had allergy f/u DrWhelan- on Zyrtek & Astelin...    EXAM is unchanged- Afeb, VSS, O2sat=93% on RA;  HEENT- neg, mallampati2;  Chest- bibasilar velcro  rales L>R ~1/3rd the way up;  Heart- RR w/o m/r/g;  Abd- soft, nontender, neg;  Ext- neg w/o c/c/e... IMP/PLAN>>  Given 2017 Flu vaccine today, rec to add Delsym- 2tsp Bid for cough...  Pulm fibrosis- likely UIP, Exercise induced hypoxemia> continue Pred10, O2 at 2L/min Qhs & w/ exercise, low sodium low carb diet...  Cardiac issues- 84 y/o, HBP, ASPVD w/ bilat-CAEs (Left 2008, Right 2015), 4cm AAA, TIA/stroke> followed by [name] & [name].  Medical issues- as above + HL, prostate ca (s/p seeds), DJD/spondylosis, MCI/dementia> followed by [name] & [name].  ~  January 22, 2016:  6wk ROV & add-on appt requested for worsening cough>  [name] [name] presents c/o 3 wk hx cough- "a deep intense cough", intermittently prod of a small amt of yellow sput, some incr SOB noted due to the coughing paroxysms but he denies CP or f/c/s; of note the cough seems worse at night (but not every night);  He has been taking O2 at 2L/min Qhs & prn w/ exercise during the day, Pred10/d, GFN-400 2Bid w/ fluids, Delsym prn;  The episode sounds like an IPF exacerbation & we discussed Rx w/ Depo20, incr Pred to 20mg /d for 2wks then back to 10mg /d regular dose, LEVAQUIN 500mg x7d, Align daily & Hydromet cough syrup prn;  We also discussed an antireflux regimen for the nocturnal cough- Protonix40 before dinner, NPO after dinner in the eve, elev HOB 6"...    EXAM is unchanged- Afeb, VSS, O2sat=91% on RA;  HEENT- neg, mallampati2;  Chest- bibasilar velcro rales L>R ~1/3rd the way up (of note: pt did not cough once during the OV;  Heart- RR w/o m/r/g;  Abd- soft, nontender, neg;  Ext- neg w/o c/c/e... IMP/PLAN>>  This episode may have been triggered by an upper resp infection, nocturnal reflux and LPR, or an IPF exac-- rec treatment as above w/ O2, Depo/Pred bump, Levaquin, Mucinex, Hydromet, plus we initiated a vigorous antireflux protocol w/ PPI before dinner, NPO after dinner, elev HOB 6"... He has a regular sched OV in mid-Jan & we will  recheck  pt at that time...  ~  April 20, 2016:  40mo ROV & pulmonary follow up visit> pt states that he improved after the last visit w/ IPF exac, treated w/ Levaquin & Pred bump, & pt feels that he is back to his baseline; wife is not so sure w/ incr SOB while taking out the trash & simple chores (esp if he tries it w/o his oxygen);  He denies CP, palpit, and feels that his SOB is about the same w/ exertion; mild dry cough esp at night, no sput, no hemoptysis...    Pulm fibrosis- likely UIP, Hypoxemic respiratory failure> on O2 2L/min w/ exercise & Qhs, but encouraged to use it 24/7, and PRED 10mg  Qam; he has Zyrtek & Astelin for sinuses...     Cardiac issues- 84 y/o, HBP, ASPVD w/ bilat-CAEs (Left 2008, Right 2015), 4cm AAA, TIA/stroke> followed by [name] & [name].    Medical issues- as above + HL, prostate ca (s/p seeds), DJD/spondylosis, MCI/dementia> followed by [name] & [name].    EXAM shows Afeb, VSS, O2sat=94% on 3L pulse dose POC;  HEENT- neg, mallampati2;  Chest- bibasilar velcro rales L>R ~1/3rd the way up (of note: pt did not cough once during the OV);  Heart- RR w/o m/r/g;  Abd- soft, nontender, neg;  Ext- neg w/o c/c/e; Neuro- intact x mild memory impairment... IMP/PLAN>>  [name] [name] is +- stable and says he's doing OK; wife notes incr SOB w/ activities while not using the O2 eg- taking out the trash etc; he has a set routine & he seems to do ok; still drives some & she is asking for guidelines so we discussed not driving if S2GBT DV<76%; he recovered satis from the prev acute exac (using Levaquin & bump Pred) but new baseline is sl lower; Rec to continue the O2 w/ all activities, & the Pred 10mg /d; he will try to gradually incr exercise & expand his lungs; continue vigorous antireflux regimen which we reviewed today... we plan ROV in 14mo, sooner if needed prn.  ~  Jul 20, 2016:  21mo ROV & [name] [name] was HOSP by [facility]-DrCain 3/29 - 06/18/16 for a 5.9cm AAA w/ Aorto bi-iliac endovasc stent graft; he  did well & has recovered from the surg; they increased his O2 to 3L/min after the surg... We reviewed the following medical problems during today's office visit >>     Pulm fibrosis- likely UIP, Hypoxemic respiratory failure> on O2 3L/min w/ exercise & Qhs, but encouraged to use it 24/7, and PRED 10mg  Qam; he has Zyrtek & Astelin for sinuses...     Cardiac issues- 84 y/o, HBP, Cardiomyopathy w/ EF=45-50% & diffuse HK + Gr2DD (2DEcho 1/15), ASPVD w/ bilat-CAEs (Left 2008, Right 2015), 4cm AAA, TIA/stroke> followed by Youlanda Mighty; on ASA81/d...    Medical issues- as above + HL (on Cres5), prostate ca (s/p seeds), DJD/spondylosis, MCI/dementia> followed by [name] & [name]=> seen 05/04/16- hx stroke & MCI, signif bruising therefore stopped Plavix, cont ASA; continue Cerefolin & Resveratrol...    EXAM shows Afeb, VSS, O2sat=100% on 3L pulse dose POC;  HEENT- neg, mallampati2;  Chest- bibasilar velcro rales L>R ~1/3rd the way up (of note: pt did not cough once during the OV);  Heart- RR w/o m/r/g;  Abd- soft, nontender, neg;  Ext- neg w/o c/c/e; Neuro- intact x mild memory impairment...  Ambulatory oximetry on 3L/min [place]>  O2sat=93% on 3L/min oxygen at rest w/ pulse=63/min;  He ambulated 2 Laps (185'ea) & O2 nadir at peak exercise  was 73% w/ pulse=105/min... Rec to incr O2 to 4-5L transiently after exercise, then back to his baseline 3L/min...  LABS 07/20/16>  Chems- wnl;  CBC- ok w/ Hg=12.8, WBC=12K;  TSH=3.94;   IMP/PLAN>>  [name] [name] has gone thru major surg w/ Ao/Bi-iliac stent graft;  His IPF has deteriorated & I wonder if her had an exacerbation during this time? Now on 3L/min & we have maintained Pred 10mg /d...  ~  October 21, 2016:  68mo ROV & pulm follow up> [name] [name] doesn't feel that the 3/18 surg caused any set-back but wife thinks that he is sl worse;  Pt states that O2 sats are "good" in the 90's on 3L/min pulse-dose; he denies SOB! But has difficulty taking out the trash cans, says he golfs some w/ O2  in the cart & uses it prn;  He notes sl cough, min beige sput, no hemoptysis, no f/c/s, no CP, etc...     He saw VascSurg- DrCain 07/23/16>  Hx bilat CAE's and endovasc repair of AAA (3/18- Aorto Bi-iliac endovascular stent graft);  CT Angio Abd & Pelvis 07/20/16 reviewed- patent aortic stent graft, small endoleak visualized & they are following, Scan also shows severe anterolisthesis at L5-S1, extensive diverticulosis, interstitial fibrosis at lung bases...    Pulm fibrosis- likely UIP, Hypoxemic respiratory failure> on O2 3L/min w/ exercise & Qhs, but encouraged to use it 24/7, and PRED 10mg  Qam; he has Zyrtek & Astelin for sinuses...     Cardiac issues- 84 y/o, HBP, Cardiomyopathy w/ EF=45-50% & diffuse HK + Gr2DD (2DEcho 1/15), ASPVD w/ bilat-CAEs (Left 2008, Right 2015), 4cm AAA, TIA/stroke> followed by Youlanda Mighty; on ASA81/d...    Medical issues- as above + HL (on Cres5), prostate ca (s/p seeds), DJD/spondylosis, MCI/dementia> followed by [name] & [name]=> seen 05/04/16- hx stroke & MCI, signif bruising therefore stopped Plavix, cont ASA; continue Cerefolin & Resveratrol...    EXAM shows Afeb, VSS, O2sat=94% on 3L pulse dose POC;  HEENT- neg, mallampati2;  Chest- bibasilar velcro rales L>R ~1/2 the way up (of note: pt did not cough once during the OV);  Heart- RR w/o m/r/g;  Abd- soft, nontender, neg;  Ext- neg w/o c/c/e; Neuro- intact x mild memory impairment...  CXR 10/21/16 (independently reviewed by me in the PACS system) showed stable cardiomeg, diffuse bilat interstitial prom & elev left hemidiaph, Tspine scoliosis, no acute changes...  IMP/PLAN>>  [name] [name] is stable w/ his severe disease- pulm, cardiovasc, etc;  Continue same meds & we plan routine recheck 4mo...  ~  February 22, 2017:  42mo ROV & pt reports doing about the same- he remains on 3L/min O2, mild cough w/ clear phlegm, & he thinks breathing is better but wife says no; his CC is DOE while carrying a load, denies CP & he indicates  that [place] said he was in great shape, [facility]-DrCain noted small leak (low risk), [name] rec to use Luminosity, PCP-Dr. Kelton Pillar did labs & they were ok... We reviewed the following interval Epic notes>      He saw NEURO- [name] 12/01/16>  Mild cognitive impairment, hx stroke 03/2013 (CT/MRI showed subacute lacunar infarct in R-internal capsule region, 2DEcho w/ 45-50% EF w/ diffuse HK, & CDoppler w/ 80-99% R-ICA stenosis=> R-CAE performed by [facility]);  Short term memory deficits felt to be stable, on ASA, cerefolin & reservatrol + mentally challenging exercises, and memory compensation strategies...    He saw [facility]- DrNishan 12/16/16>  Hx CAD & ASPVD- s/p bilat CAE & EVAR of  AAA, nonischemic myoview 05/2013 w/ EF=56%; he was felt to be stable, no change in meds, f/u planned 58yr...    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     He says that he is due for a shot in his SI joint for LBP... We reviewed the following medical problems during today's office visit>      Pulm fibrosis- likely UIP w/ Hypoxemic respiratory failure> on O2 3L/min w/ exercise & Qhs, but encouraged to use it 24/7, and PRED 10mg  Qam; he has Zyrtek & Astelin for sinuses...     Cardiac issues- 84 y/o, HBP, Cardiomyopathy w/ EF=45-50% & diffuse HK + Gr2DD (2DEcho 1/15), ASPVD w/ bilat-CAEs (Left 2008, Right 2015), TIA/stroke, 5.9cm AAA- s/p endovasc surg 3/18> followed by [name] & [name]/ Donzetta Matters; on ASA81/d and monitored carefully w/ seriasl CT scan due to endoleak...Marland KitchenMarland KitchenMarland Kitchen    Medical issues- as above + HL (on Cres5), prostate ca (s/p seeds), DJD/spondylosis, MCI/dementia> followed by [name] & [name]=> seen regularly- hx stroke & MCI, signif bruising therefore stopped Plavix, cont ASA; continue Cerefolin & Resveratrol...    EXAM shows Afeb, VSS, O2sat=94% on 3L  pulse dose POC;  HEENT- neg, mallampati2;  Chest- bibasilar velcro rales L>R ~1/2 the way up (of note: pt did not cough once during the OV);  Heart- RR w/o m/r/g;  Abd- soft, nontender, neg;  Ext- neg w/o c/c/e; Neuro- intact x mild memory impairment...  CT Angio of Abd/Pelvis 02/03/17>  IMPRESSION:  VASCULAR-- 1. Stable aneurysm sac size of 5.1 x 5.9 cm. Mild type 2 endoleak remain suspected based on increased density within the inferior right aspect of the aneurysm sac on delayed imaging. Outflow remain suspected via the accessory lower pole right renal artery based on delayed perfusion of the right lower kidney. Inflow is more difficult to assess on the current study due to significant respiratory motion on the arterial phase scan. Lumbar inflow remains the most likely source of type 2 endoleak.  2. Mild aneurysmal disease of the native proximal aorta remains stable measuring 3.3 cm in greatest diameter just below the SMA origin.   NON-VASCULAR-- Stable fibrotic lung disease at the lung bases and severe degenerative disc disease at L5-S1 with associated anterolisthesis. IMP/PLAN>>  [name] [name] is overall stable w/ his severe atherosclerotic vasc dis, mild cognitive impairment/ vasc dementia, and pulm fibrosis on O3 at [place];  We decided to continue same, gave him a Rx for  ZPak prn use this winter, & rec to stay active, call for any problems...    ~  June 23, 2017:  4mo ROV & pulmonary follow up visit>  [name] [name] indicates that his breathing is OK, notes SOb w/ most activities- carrying objects, taking out the trash, etc; he has played 1 round of golf so far this yr; denies CP/ palopit/ dizzy/ edema; notes some cough, yellowish phlegm, but no blood, not tight, no wheezing, no f/c/s, GI is ok; he is still too sedentary & asked to incr PT at home, more exercise... His saw his PCP- DrEGriffin ~20mo ago w/ labs done & reported ok (they are again asked to get copies sent to [place]); he is up to date on pneumonia & Flu  vaccines, he did not need the ZPak this winter...  We reviewed interval notes in Epic>  He saw NEURO-CMatin,NP on 05/31/17>  F/u stroke 2015 & MCI; supposed to be on ASA81 daily but wasn't taking this=> told to restart; denies cerebral ischemic symtoms; he is minimally active, but does computer games & was prev rec to try Luminosity;  MMSE slowly dropped- now at 26/30; they have not rec to start Aricept etc... We reviewed the following medical problems during today's office visit>      Pulm fibrosis- likely UIP w/ Hypoxemic respiratory failure> on O2- 3L/min w/ exercise & Qhs, but encouraged to use it 24/7, and PRED 10mg  Qam; he has Zyrtek & Astelin for sinuses...     Cardiac issues- 84 y/o, HBP, Cardiomyopathy w/ EF=45-50% & diffuse HK + Gr2DD (2DEcho 1/15), ASPVD w/ bilat-CAEs (Left 2008, Right 2015), TIA/stroke, 5.9cm AAA- s/p endovasc surg 3/18> followed by [name] & [name]/ Donzetta Matters; on ASA81/d and monitored carefully w/ serial CT scans due to endoleak...Marland KitchenMarland KitchenMarland Kitchen    Medical issues- as above + HL (on Cres5), prostate ca (s/p seeds), DJD/spondylosis, MCI/dementia> followed by [name] & [name]=> seen regularly- hx stroke & MCI, signif bruising therefore stopped Plavix, cont ASA; continue Cerefolin & Resveratrol...    EXAM shows Afeb, VSS, O2sat=96% on 3L pulse dose POC;  HEENT- neg, mallampati2;  Chest- bibasilar velcro rales L>R ~1/2 the way up (of note: pt did not cough during the OV);  Heart- RR w/o m/r/g;  Abd- soft, nontender, neg;  Ext- neg w/o c/c/e; Neuro- intact x mild memory impairment... IMP/PLAN>>  [name] [name] is stable overall, he has been taking his ASA81 & we will add this to his med list; LABS were done by his PCP & they will get copies for [place] to scan into Epic; Breathing appears to be about at baseline- he has signif pulm fibrosis, O2 dependent, & is too sedentary- again asked to incr his activ level; REC to continue same meds & call for any questions...     Past Medical History:  Diagnosis  Date  . AAA (abdominal aortic aneurysm) ([place])   . Allergy   . Carotid artery stenosis   . Diplopia   . GERD (gastroesophageal reflux disease)   . Hyperlipidemia   . Memory loss   . Prostate cancer ([place])   . Pulmonary fibrosis ([place])   . Spondylisthesis 2015   Spine  . Stroke ([place]) 04/18/13   some weakness left-returning strength  . TIA (transient ischemic attack)     Past Surgical History:  Procedure Laterality Date  . ABDOMINAL AORTIC ENDOVASCULAR STENT GRAFT N/A 06/17/2016   Procedure: ABDOMINAL AORTIC ENDOVASCULAR STENT GRAFT;  Surgeon: Waynetta Sandy, MD;  Location: [facility] OR;  Service: Vascular;  Laterality: N/A;  . CAROTID ENDARTERECTOMY  Sept. 2008   Elective left carotid endarterectomy  . CAROTID ENDARTERECTOMY Right 04/27/2013   CE  . ENDARTERECTOMY Right 04/27/2013   Procedure: ENDARTERECTOMY CAROTID;  Surgeon: Mal Misty, MD;  Location: [facility];  Service: Vascular;  Laterality: Right;  . EYE SURGERY     Bilateral cataract   . FOOT SURGERY      left Achilles Tendon Repair  . seed implantation  2001   for prostate ca    Outpatient Encounter Medications as of 06/23/2017  Medication Sig  . acetaminophen (TYLENOL) 500 MG tablet Take 500 mg by mouth every 6 (six) hours as needed for mild pain.  Marland Kitchen aspirin 81 MG tablet Take 81 mg by mouth daily.  Marland Kitchen azelastine (ASTELIN) 0.1 % nasal spray Place 2 sprays into both nostrils 2 (two) times daily.   Marland Kitchen  cetirizine (ZYRTEC) 10 MG tablet Take 10 mg by mouth daily as needed for allergies.   Marland Kitchen dextromethorphan-guaiFENesin (MUCINEX DM) 30-600 MG 12hr tablet Take 1 tablet by mouth 2 (two) times daily.  Marland Kitchen L-Methylfolate-B12-B6-B2 (CEREFOLIN) 08-20-48-5 MG TABS Take 1 tablet by mouth daily.  . Multiple Vitamins-Minerals (CENTRUM SILVER PO) Take 1 tablet by mouth daily.   . naproxen sodium (ANAPROX) 220 MG tablet Take 220 mg by mouth daily as needed (pain).  . Omega-3 Fatty Acids (FISH OIL PO) Take 1 capsule by mouth daily.  .  pantoprazole (PROTONIX) 40 MG tablet TAKE 1 TABLET DAILY,30 MINUTES BEFORE DINNER.  Marland Kitchen predniSONE (DELTASONE) 10 MG tablet TAKE 1 TABLET EVERY DAY WITH BREAKFAST.  Marland Kitchen Resveratrol 100 MG CAPS Take 100 mg by mouth every evening.   . rosuvastatin (CRESTOR) 5 MG tablet Take 5 mg by mouth at bedtime.  . [DISCONTINUED] diclofenac (VOLTAREN) 50 MG EC tablet    Facility-Administered Encounter Medications as of 06/23/2017  Medication  . aspirin EC tablet 81 mg    Allergies  Allergen Reactions  . Sudafed [Pseudoephedrine Hcl]     Causes dizziness and nervousness  . Atorvastatin Other (See Comments)    Reaction: Patient's family reports "confusion" and would prefer crestor     Immunization History  Administered Date(s) Administered  . Influenza, High Dose Seasonal PF 12/03/2015, 12/23/2016  . Influenza-Unspecified 12/21/2014  . Pneumococcal Conjugate-13 02/28/2014  . Pneumococcal Polysaccharide-23 03/23/1999    Current Medications, Allergies, Past Medical History, Past Surgical History, Family History, and Social History were reviewed in [employer] record.   Review of Systems             All symptoms NEG except where BOLDED >>  Constitutional:  F/C/S, fatigue, anorexia, unexpected weight change. HEENT:  HA, visual changes, hearing loss, earache, nasal symptoms, sore throat, mouth sores, hoarseness. Resp:  cough, sputum, hemoptysis; SOB, tightness, wheezing. Cardio:  CP, palpit, DOE, orthopnea, edema. GI:  N/V/D/C, blood in stool; reflux, abd pain, distention, gas. GU:  dysuria, freq, urgency, hematuria, flank pain, voiding difficulty. MS:  joint pain, swelling, tenderness, decr ROM; neck pain, back pain, etc. Neuro:  HA, tremors, seizures, dizziness, syncope, weakness, numbness, gait abn. Skin:  suspicious lesions or skin rash. Heme:  adenopathy, bruising, bleeding. Psyche:  Memory loss, confusion, agitation, sleep disturbance, hallucinations, anxiety, depression,  suicidal.   Objective:   Physical Exam       Vital Signs:  Reviewed...   General:  WD, WN, 84 y/o WM in NAD; alert & oriented; pleasant & cooperative... HEENT:  [place]/AT; Conjunctiva- pink, Sclera- nonicteric, EOM-wnl, PERRLA, EACs-clear, TMs-wnl; NOSE-clear; THROAT-clear & wnl.  Neck:  Supple w/ fair ROM; no JVD; normal carotid impulses w/o bruits; no thyromegaly or nodules palpated; no lymphadenopathy.  Chest:  Bibasilar crackles ~1/3rd way up the back;  No wheezing, rhonchi, signs of consolidation... Heart:  Regular Rhythm; norm S1 & S2 without murmurs, rubs, or gallops detected. Abdomen:  Soft & nontender- no guarding or rebound; normal bowel sounds; no organomegaly or masses palpated. Ext:  Normal ROM; without deformities or arthritic changes; no varicose veins, venous insuffic, or edema;  Pulses intact w/o bruits. Neuro:  No focal neuro deficits; sensory testing normal; gait normal & balance OK. Derm:  No lesions noted; no rash etc. Lymph:  No cervical, supraclavicular, axillary, or inguinal adenopathy palpated.   Assessment:      IMP >>     Interstitial Lung Disease-- UIP/ IPF w/ characteristic Hi-res CT Chest  findings and collagen-vasc labs showing elev Rheum Factor>600 & Sed=34, otherw neg Anti-CCP/ ANA/ & CRP    CARDIAC issues>  84 y/o, HBP, vasc dis w/ bilat carotid dis (s/p bilat CAEs) & AAA (5.9cm- s/p endovasc repair 05/2016 by Loel Dubonnet), TIA/ stroke, HL; followed by [name] & [name]/ Donzetta Matters...    MEDICAL issues>  37 y/o, HBP, vasc dis w/ bilat carotid dis (s/p bilat CAEs) & AAA, TIA/ stroke, HL, prostate ca (s/p seeds), DJD/ spondylosis, mild dementia; followed by [name] & [name]...  PLAN >>  06/24/15>   We discussed pulmonary fibrosis and the evaluation- they are familiar w/ this dis since a church friend was recently diagnosed and has since passed away; they are understandably concerned and I pointed out some differences in [place] presentation (ie- he has had this on his CXR  w/o much change since 2015);  We will proceed w/ Labs and Hi-res CT Chest... 07/11/15>   Pt & family have opted for a middle-of-the-road approach and declined further testing & Antifibrotic therapy; he appears to need O2 rx w/ activities now but is reluctant to accept this (we will check ONO & make final recommendation);  Plus we will give him a trial of Pred 10mg /d... 08/27/15>   [name] [name] is stable on the [place] well; we wrote for a handicap placard; discussed asking [place] [employer] company for a POC to encourage using oxygen w/ activities. 12/03/15>   Stable IPF/UIP on Pred10 + home O2, continue same; given 2017 Flu vaccine today, rec to add Delsym- 2tsp Bid for cough... 02/10/16>   This episode may have been triggered by an upper resp infection, nocturnal reflux and LPR, or an IPF exac-- rec treatment as above w/ O2, Depo/Pred bump, Levaquin, Mucinex, Hydromet, plus we initiated a vigorous antireflux protocol w/ PPI before dinner, NPO after dinner, elev HOB 6". 07/20/16>   [name] [name] has gone thru major surg w/ Ao/Bi-iliac stent graft;  His IPF has deteriorated & I wonder if her had an exacerbation during this time? Now on 3L/min 7 we have maintained Pred 10mg /d.. 10/21/16>   Stable- continue same meds. 02/22/17>   [name] [name] is overall stable w/ his severe atherosclerotic vasc dis, mild cognitive impairment/ vasc dementia, and pulm fibrosis on O3 at [place];  We decided to continue same, gave him a Rx for  ZPak prn use this winter, & rec to stay active, call for any problems.  06/23/17>   [name] [name] is stable overall, he has been taking his ASA81 & we will add this to his med list; LABS were done by his PCP & they will get copies for [place] to scan into Epic; Breathing appears to be about at baseline- he has signif pulm fibrosis, O2 dependent, & is too sedentary- again asked to incr his activ level; REC to continue same meds & call for any questions.   Plan:     CONTINUE HOME OXYGEN at 3L/min regularly...  Patient's  Medications  New Prescriptions   No medications on file  Previous Medications   ACETAMINOPHEN (TYLENOL) 500 MG TABLET    Take 500 mg by mouth every 6 (six) hours as needed for mild pain.   ASPIRIN 81 MG TABLET    Take 81 mg by mouth daily.   AZELASTINE (ASTELIN) 0.1 % NASAL SPRAY    Place 2 sprays into both nostrils 2 (two) times daily.    CETIRIZINE (ZYRTEC) 10 MG TABLET    Take 10 mg by mouth daily as needed for allergies.  DEXTROMETHORPHAN-GUAIFENESIN (MUCINEX DM) 30-600 MG 12HR TABLET    Take 1 tablet by mouth 2 (two) times daily.   L-METHYLFOLATE-B12-B6-B2 (CEREFOLIN) 08-20-48-5 MG TABS    Take 1 tablet by mouth daily.   MULTIPLE VITAMINS-MINERALS (CENTRUM SILVER PO)    Take 1 tablet by mouth daily.    NAPROXEN SODIUM (ANAPROX) 220 MG TABLET    Take 220 mg by mouth daily as needed (pain).   OMEGA-3 FATTY ACIDS (FISH OIL PO)    Take 1 capsule by mouth daily.   PANTOPRAZOLE (PROTONIX) 40 MG TABLET    TAKE 1 TABLET DAILY,30 MINUTES BEFORE DINNER.   PREDNISONE (DELTASONE) 10 MG TABLET    TAKE 1 TABLET EVERY DAY WITH BREAKFAST.   RESVERATROL 100 MG CAPS    Take 100 mg by mouth every evening.    ROSUVASTATIN (CRESTOR) 5 MG TABLET    Take 5 mg by mouth at bedtime.  Modified Medications   No medications on file  Discontinued Medications   DICLOFENAC (VOLTAREN) 50 MG EC TABLET

## 2017-06-23 NOTE — Patient Instructions (Signed)
Today we updated your med list in our EPIC system...    Continue your current medications the same...  Continue your OXYGEN therapy and the PREDNISONE 10mg /d...  We discussed exercise & stressed SAFETY- no falling allowed!  Call for any questions or if we can be of service in any way...  Let's plan a follow up visit in 17mo, sooner if needed for problems.Marland KitchenMarland Kitchen

## 2017-07-12 ENCOUNTER — Other Ambulatory Visit: Payer: Self-pay | Admitting: Pulmonary Disease

## 2017-07-17 DIAGNOSIS — J84112 Idiopathic pulmonary fibrosis: Secondary | ICD-10-CM | POA: Diagnosis not present

## 2017-07-17 DIAGNOSIS — I2510 Atherosclerotic heart disease of native coronary artery without angina pectoris: Secondary | ICD-10-CM | POA: Diagnosis not present

## 2017-07-19 ENCOUNTER — Other Ambulatory Visit: Payer: Self-pay | Admitting: Pulmonary Disease

## 2017-07-21 DIAGNOSIS — J300 Vasomotor rhinitis: Secondary | ICD-10-CM | POA: Diagnosis not present

## 2017-07-26 IMAGING — DX DG CHEST 1V PORT
1 series · 1 of 1 positions shown · non-contrast
Comparison: Two-view chest x-ray 10/15/2015

CLINICAL DATA: Abdominal aortic aneurysm repair 2 day.

EXAM:
PORTABLE CHEST 1 VIEW

[chest pa]
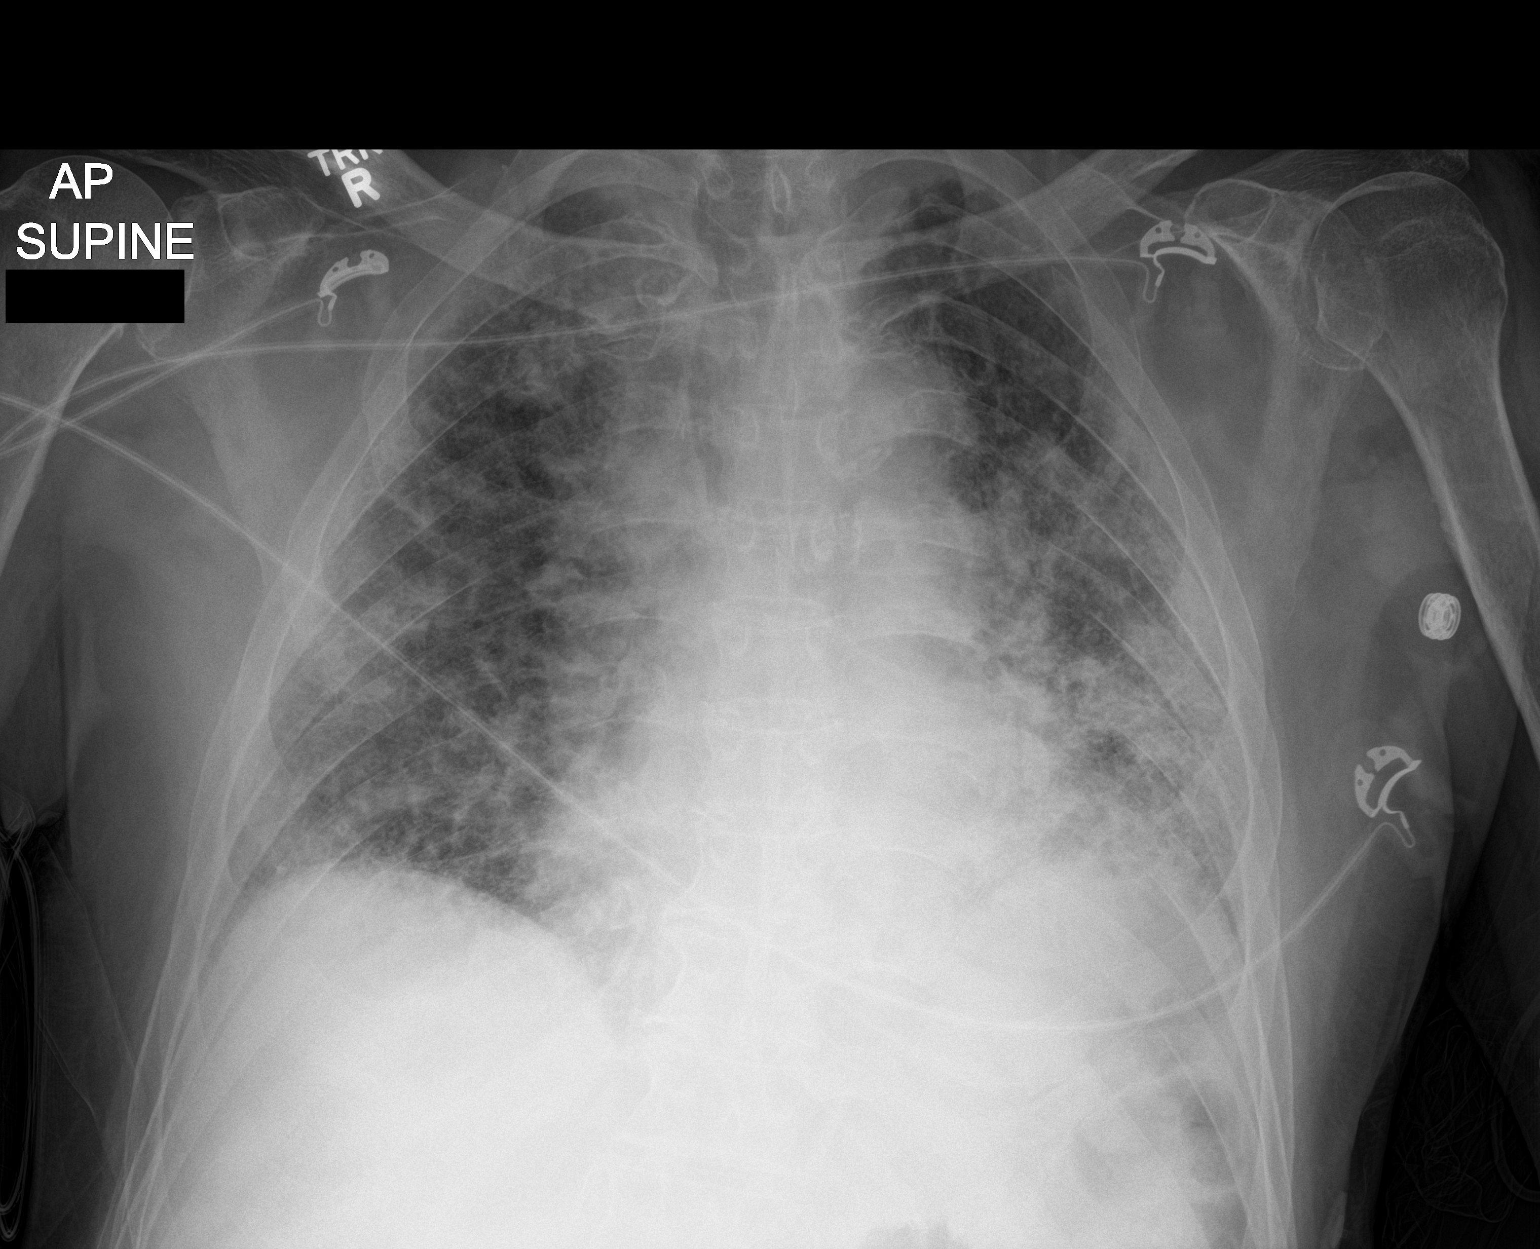

[1 of 1 positions shown; findings below may reference images not displayed]

FINDINGS: The heart is enlarged. A diffuse interstitial and airspace pattern
has increased. This likely represents edema superimposed on chronic
interstitial disease. This is most prominent in the left lower lobe.
No other focal airspace disease is present. The lung volumes are
low. The visualized soft tissues and bony thorax are unremarkable.
IMPRESSION: 1. Increase in diffuse interstitial and airspace pattern likely
representing edema superimposed on chronic interstitial lung
disease. Disease is chronically most profound in the left lower
lobe. Infection or aspiration is considered less likely.

## 2017-07-27 IMAGING — CR DG CHEST 1V PORT
1 series · 1 of 1 positions shown · non-contrast
Comparison: 06/17/2016 and 10/15/2015.

CLINICAL DATA: Respiratory crackles. History of pulmonary fibrosis.

EXAM:
PORTABLE CHEST 1 VIEW

[AP]
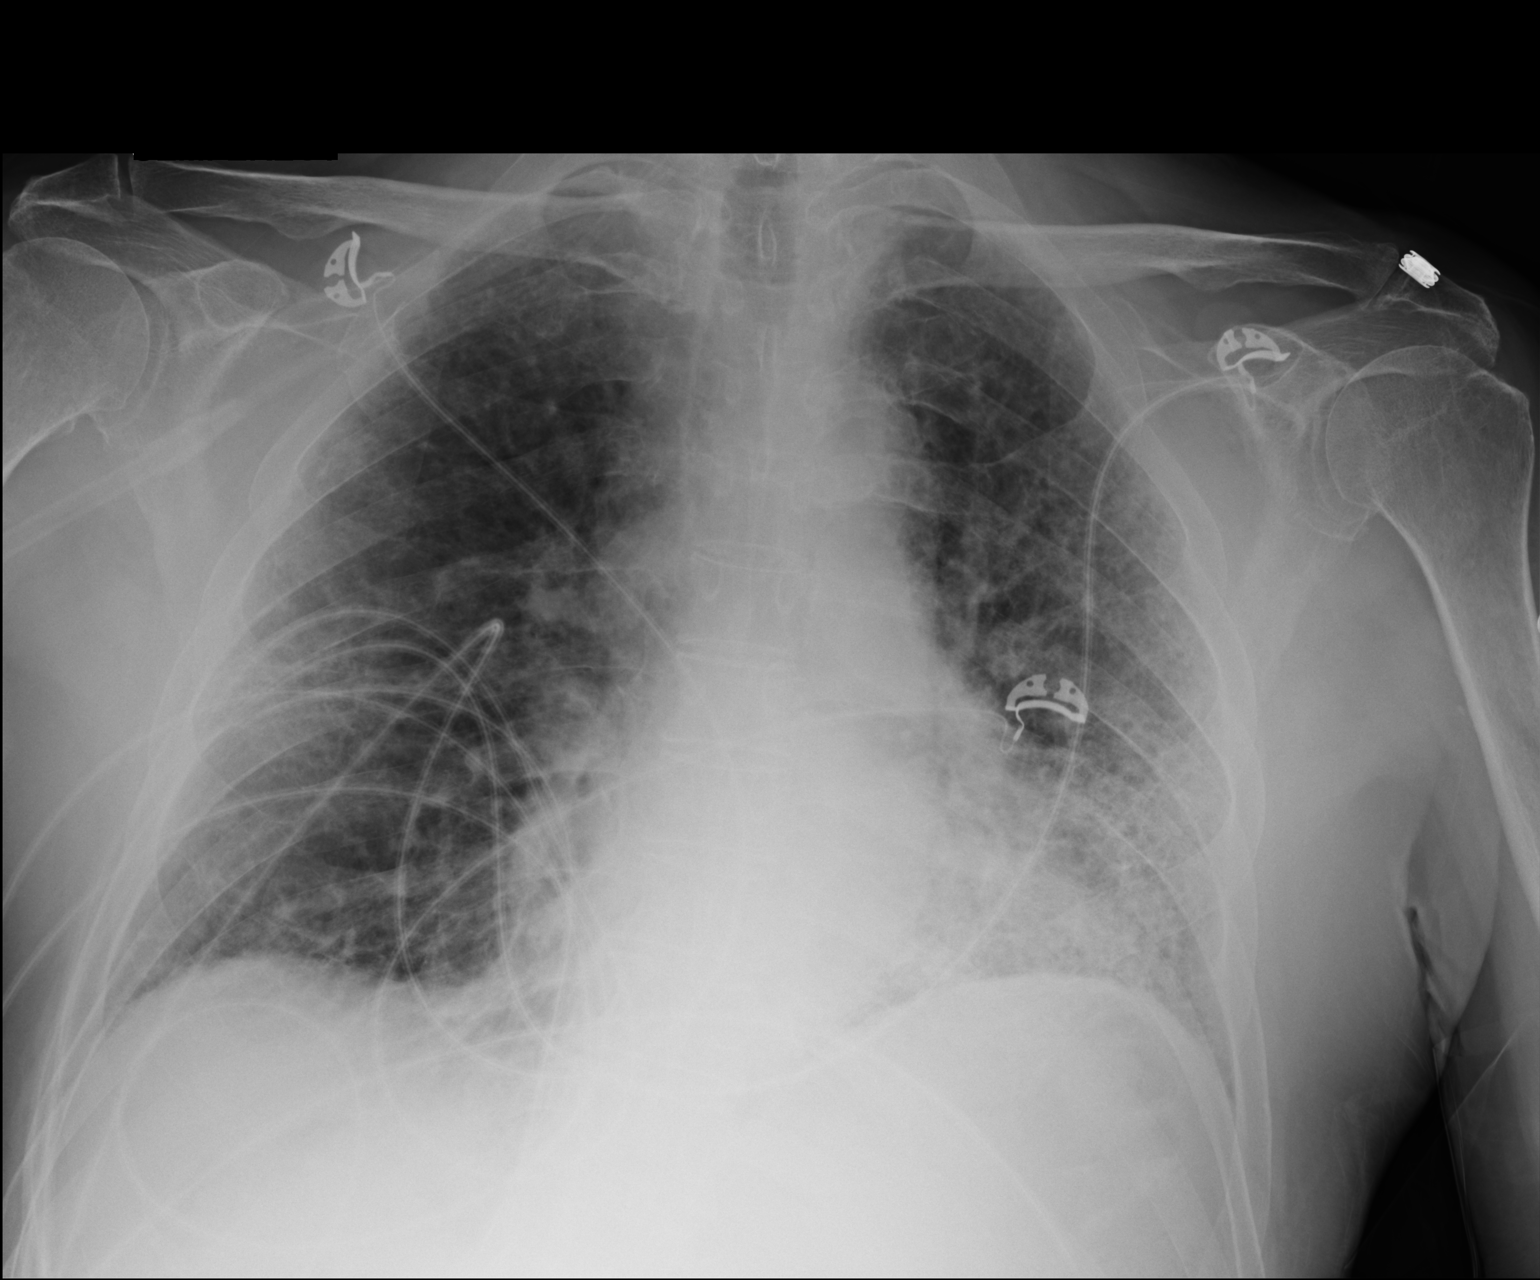

[1 of 1 positions shown; findings below may reference images not displayed]

FINDINGS: Cardiac silhouette is normal in size. No mediastinal or hilar
masses.

Irregularly thickened interstitial markings are noted most
prominently on the left, along the mid and lower lung. This is
similar to the previous day's study. This may all reflect
interstitial fibrosis. Superimposed infection is possible.

No convincing pleural effusion.  No pneumothorax.
IMPRESSION: 1. Findings are similar to the previous day's study. Suspect
findings are due to the patient's chronic interstitial fibrosis.
Superimposed infection is possible. No convincing pulmonary edema on
today's exam.

## 2017-08-14 ENCOUNTER — Other Ambulatory Visit: Payer: Self-pay | Admitting: Pulmonary Disease

## 2017-08-16 ENCOUNTER — Other Ambulatory Visit (HOSPITAL_COMMUNITY): Payer: Medicare Other

## 2017-08-16 ENCOUNTER — Ambulatory Visit: Payer: Medicare Other | Admitting: Family

## 2017-08-16 DIAGNOSIS — I2510 Atherosclerotic heart disease of native coronary artery without angina pectoris: Secondary | ICD-10-CM | POA: Diagnosis not present

## 2017-08-16 DIAGNOSIS — J84112 Idiopathic pulmonary fibrosis: Secondary | ICD-10-CM | POA: Diagnosis not present

## 2017-08-18 DIAGNOSIS — H52223 Regular astigmatism, bilateral: Secondary | ICD-10-CM | POA: Diagnosis not present

## 2017-08-23 ENCOUNTER — Other Ambulatory Visit: Payer: Self-pay | Admitting: Pulmonary Disease

## 2017-08-25 DIAGNOSIS — H0014 Chalazion left upper eyelid: Secondary | ICD-10-CM | POA: Diagnosis not present

## 2017-08-30 DIAGNOSIS — M5416 Radiculopathy, lumbar region: Secondary | ICD-10-CM | POA: Diagnosis not present

## 2017-09-01 DIAGNOSIS — H0014 Chalazion left upper eyelid: Secondary | ICD-10-CM | POA: Diagnosis not present

## 2017-09-16 DIAGNOSIS — J84112 Idiopathic pulmonary fibrosis: Secondary | ICD-10-CM | POA: Diagnosis not present

## 2017-09-16 DIAGNOSIS — I2510 Atherosclerotic heart disease of native coronary artery without angina pectoris: Secondary | ICD-10-CM | POA: Diagnosis not present

## 2017-09-23 ENCOUNTER — Other Ambulatory Visit: Payer: Self-pay | Admitting: Pulmonary Disease

## 2017-09-27 ENCOUNTER — Ambulatory Visit (INDEPENDENT_AMBULATORY_CARE_PROVIDER_SITE_OTHER): Payer: Medicare Other | Admitting: Family

## 2017-09-27 ENCOUNTER — Ambulatory Visit (HOSPITAL_COMMUNITY)
Admission: RE | Admit: 2017-09-27 | Discharge: 2017-09-27 | Disposition: A | Discharge status: Home / Self Care | Payer: Medicare Other | Source: Ambulatory Visit | Attending: Family | Admitting: Family

## 2017-09-27 ENCOUNTER — Other Ambulatory Visit: Payer: Self-pay

## 2017-09-27 ENCOUNTER — Encounter: Payer: Self-pay | Admitting: Family

## 2017-09-27 VITALS — BP 125/82 | HR 69 | Temp 97.0°F | Resp 18 | Ht 69.0 in | Wt 179.0 lb

## 2017-09-27 DIAGNOSIS — I714 Abdominal aortic aneurysm, without rupture, unspecified: Secondary | ICD-10-CM

## 2017-09-27 DIAGNOSIS — I6523 Occlusion and stenosis of bilateral carotid arteries: Secondary | ICD-10-CM

## 2017-09-27 DIAGNOSIS — Z87891 Personal history of nicotine dependence: Secondary | ICD-10-CM | POA: Diagnosis not present

## 2017-09-27 DIAGNOSIS — Z95828 Presence of other vascular implants and grafts: Secondary | ICD-10-CM | POA: Diagnosis not present

## 2017-09-27 DIAGNOSIS — Z9889 Other specified postprocedural states: Secondary | ICD-10-CM | POA: Insufficient documentation

## 2017-09-27 DIAGNOSIS — E785 Hyperlipidemia, unspecified: Secondary | ICD-10-CM | POA: Insufficient documentation

## 2017-09-27 NOTE — Patient Instructions (Signed)

## 2017-09-27 NOTE — Progress Notes (Signed)
[facility] [place] HISTORY AND PHYSICAL   CC: Follow up EVAR and extracranial carotid artery stenosis    History of Present Illness:   [name] [name] is a 84 y.o. male who returns for follow up surveillance of his extracranial carotid arteries and is s/p Aorto Bi-iliac endovascular stent graft with ipsilateral right main body 28 x 12 x 16cm and contralateral limb 12 x 14cm On 06-17-16 by Dr. Donzetta Matters for 6 cm AAA.  After exclusion of the aneurysm with endovascular graft there are no type I or 3 endoleak's with possible late type II from lumbar arteries and possible type II from an IMA. The dorsalis pedis arteries were palpable at completion.  Dr. Donzetta Matters last evaluated pt on 02-04-17. At that time he had a stable type II endoleak size to 5.9 cm.  Given that it is unchanged we will follow him up in 6 months with duplex.  If the size then change at that time we can probably go out to 1 year.   Dr. Donzetta Matters discussed with pt that the reasons to intervene would be rupture or increasing sac size; he believed that rupture is low risk at that time therefore no intervention is merited.  Should he have new back or abdominal pain he will seek medical attention.  We will otherwise see him in 6 months with duplex.  Pt denies abdominal or back pain.  Pt's wife states pt had a TIA in January 2015, as manifested by left arm numbness and left hand weakness.  In 2008 he had left arm weakness, then the left CEA was done.  He has some residual weakness in his left hand. Pt denies any history of speech difficulties or loss of vision.  Pt denies claudication symptoms with walking.  Pt states he saw ortho for swelling in his left ankle, wearing compression hose has decreased the ankle swelling.  Pt's wife states pt was diagnosed with pulmonary fibrosis in 2017, pulmonologist is Dr. Byrd Hesselbach. Is treated with prednisone, 2LO2/[place], and guaifenesin/ dm.  Diagnosed in 2015 with spondolythesis as a result of  remote back injury.  Wife states pt sees Dr. Leonie Man for hx of TIA and early signs of dementia.   Pt Diabetic: No Pt smoker: former smoker, quit in 1966  Pt meds include: Statin : Yes ASA: yes Other anticoagulants/antiplatelets: no    Past Medical History:  Diagnosis Date  . AAA (abdominal aortic aneurysm) ([place])   . Allergy   . Carotid artery stenosis   . Diplopia   . GERD (gastroesophageal reflux disease)   . Hyperlipidemia   . Memory loss   . Prostate cancer ([place])   . Pulmonary fibrosis ([place])   . Spondylisthesis 2015   Spine  . Stroke ([place]) 04/18/13   some weakness left-returning strength  . TIA (transient ischemic attack)     Social History Social History   Tobacco Use  . Smoking status: Former Smoker    Packs/day: 1.00    Years: 14.00    Pack years: 14.00    Types: Cigarettes    Last attempt to quit: 03/27/1965    Years since quitting: 52.5  . Smokeless tobacco: Never Used  . Tobacco comment: occ alcohol  Substance Use Topics  . Alcohol use: Yes    Alcohol/week: 0.6 oz    Types: 1 Shots of liquor per week    Comment: 3-4 drinks week bourbon  . Drug use: No    Family History Family History  Problem Relation Age  of Onset  . Stroke Mother   . Heart disease Mother        After age 68  . Varicose Veins Mother   . Hypertension Mother   . Cancer Mother        Theadora Rama   . Stroke Father   . Heart attack Father   . Deep vein thrombosis Father   . Anuerysm Father   . Heart disease Brother     Surgical History Past Surgical History:  Procedure Laterality Date  . ABDOMINAL AORTIC ENDOVASCULAR STENT GRAFT N/A 06/17/2016   Procedure: ABDOMINAL AORTIC ENDOVASCULAR STENT GRAFT;  Surgeon: Waynetta Sandy, MD;  [facility]: [facility] OR;  Service: Vascular;  Laterality: N/A;  . CAROTID ENDARTERECTOMY  Sept. 2008   Elective left carotid endarterectomy  . CAROTID ENDARTERECTOMY Right 04/27/2013   CE  . ENDARTERECTOMY Right 04/27/2013   Procedure: ENDARTERECTOMY  CAROTID;  Surgeon: Mal Misty, MD;  Location: [place];  Service: Vascular;  Laterality: Right;  . EYE SURGERY     Bilateral cataract   . FOOT SURGERY      left Achilles Tendon Repair  . seed implantation  2001   for prostate ca    Allergies  Allergen Reactions  . Sudafed [Pseudoephedrine Hcl]     Causes dizziness and nervousness  . Atorvastatin Other (See Comments)    Reaction: Patient's family reports "confusion" and would prefer crestor     Current Outpatient Medications  Medication Sig Dispense Refill  . acetaminophen (TYLENOL) 500 MG tablet Take 500 mg by mouth every 6 (six) hours as needed for mild pain.    Marland Kitchen aspirin 81 MG tablet Take 81 mg by mouth daily.    Marland Kitchen azelastine (ASTELIN) 0.1 % nasal spray Place 2 sprays into both nostrils 2 (two) times daily.   4  . cetirizine (ZYRTEC) 10 MG tablet Take 10 mg by mouth daily as needed for allergies.     Marland Kitchen dextromethorphan-guaiFENesin (MUCINEX DM) 30-600 MG 12hr tablet Take 1 tablet by mouth 2 (two) times daily.    Marland Kitchen L-Methylfolate-B12-B6-B2 (CEREFOLIN) 08-20-48-5 MG TABS Take 1 tablet by mouth daily. 90 each 3  . Multiple Vitamins-Minerals (CENTRUM SILVER PO) Take 1 tablet by mouth daily.     . naproxen sodium (ANAPROX) 220 MG tablet Take 220 mg by mouth daily as needed (pain).    . Omega-3 Fatty Acids (FISH OIL PO) Take 1 capsule by mouth daily.    . pantoprazole (PROTONIX) 40 MG tablet TAKE 1 TABLET DAILY,30 MINUTES BEFORE DINNER. 30 tablet 0  . predniSONE (DELTASONE) 10 MG tablet TAKE 1 TABLET EVERY DAY WITH BREAKFAST. 30 tablet 2  . Resveratrol 100 MG CAPS Take 100 mg by mouth every evening.     . rosuvastatin (CRESTOR) 5 MG tablet Take 5 mg by mouth at bedtime.     Current Facility-Administered Medications  Medication Dose Route Frequency Provider Last Rate Last Dose  . aspirin EC tablet 81 mg  81 mg Oral Daily Garvin Fila, MD         REVIEW OF SYSTEMS: See HPI for pertinent positives and negatives.  Physical  Examination Vitals:   09/27/17 0844 09/27/17 0849  BP: (!) 146/88 125/82  Pulse: 69 69  Resp: 18   Temp: (!) 97 F (36.1 C)   TempSrc: Oral   SpO2: 91%   Weight: 179 lb (81.2 kg)   Height: 5\' 9"  (1.753 m)    Body mass index is 26.43 kg/m.  General:  WDWN  male in NAD GAIT:normal Eyes: PERRLA Pulmonary: Respirations are non-labored, + rales in bases, no wheezes or rhonchi. Cardiac: regular rhythm,no detected murmur.  VASCULAR EXAM Carotid Bruits Right Left   Negative Negative   Abdominal aortic pulse is not palpable. Radial pulses are 2+ palpable and equal.      LE Pulses Right Left   FEMORAL 2+ palpable  2+palpable    POPLITEAL 3+palpable  2+ palpable   POSTERIOR TIBIAL  2+palpable  1+ palpable    DORSALIS PEDIS  ANTERIOR TIBIAL 2+palpable  1+palpable    Gastrointestinal: soft, nontender, BS WNL, no r/g, no palpable masses. Musculoskeletal: No muscle atrophy/wasting. M/S 4/5 throughout, Extremities without ischemic changes. Neurologic:  A&O X 3; appropriate affect, sensation is normal; speech is normal, CN 2-12 intact, pain and light touch intact in extremities, motor exam as listed above. Psychiatric: Normal thought content, mood appropriate to clinical situation.    ASSESSMENT:  [name] [name] is a 84 y.o. male who is s/p bilateral carotid endarterectomies: September, 2008 (left ), and April 27, 2013 (right). He is also s/p Aorto Bi-iliac endovascular stent graft with ipsilateral right main body 28 x 12 x 16cm and contralateral limb 12 x 14cm on 06-17-16 by Dr. Donzetta Matters for 6 cm AAA.    He had a stroke or TIA in 2008 and 2015 with left arm weakness, no strokes or TIA's subsequently. He has some residual weakness in his left hand.    DATA   EVAR Duplex  (09-27-17): AAA sac: 6.0 cm; Right CIA: 1.5 cm; Left CIA: 1.3 cm Reproducible extra-stent flow in the residual AAA sac, consistent with type 2 endoleak.   Previous (02-04-17): 5.9 cm AAA sac with stable type 2 endoleak  05-11-16 carotid duplex suggests patent bilateral carotid endarterectomy sites with no hyperplasia or restenosis. Bilateral vertebral artery flow is antegrade.  Right subclavian artery waveforms are biphasic, left are normal.  No significant change from the last exam on 05-06-15.  05-14-15  popliteal artery duplex indicates no evidence of aneurysmal dilatation of the bilateral popliteal arteries (prominent bilateral popliteal pulses).   Serum creatinine on 03-23-2016 was 1.03, as requested By Dr. Kelton Pillar.    PLAN:  Based on the patient's vascular studies and examination, and after discussing with Dr. Donnetta Hutching, pt will be scheduled for EVAR duplex in a year, and carotid duplex in a year, see Dr. Donzetta Matters afterward.   I discussed in depth with the patient the nature of atherosclerosis, and emphasized the importance of maximal medical management including strict control of blood pressure, blood glucose, and lipid levels, obtaining regular exercise, and cessation of smoking.  The patient is aware that without maximal medical management the underlying atherosclerotic disease process will progress, limiting the benefit of any interventions.  The patient was given information about stroke prevention and what symptoms should prompt the patient to seek immediate medical care.  Thank you for allowing [place] to participate in this patient's care.  Clemon Chambers, RN, MSN, FNP-C [facility] Office: [phone]  Clinic MD: Early 09/27/2017 9:01 AM

## 2017-09-27 NOTE — Progress Notes (Signed)
Vitals:   09/27/17 0844  BP: (!) 146/88  Pulse: 69  Resp: 18  Temp: (!) 97 F (36.1 C)  TempSrc: Oral  SpO2: 91%  Weight: 179 lb (81.2 kg)  Height: 5\' 9"  (1.753 m)

## 2017-09-28 DIAGNOSIS — E782 Mixed hyperlipidemia: Secondary | ICD-10-CM | POA: Diagnosis not present

## 2017-09-28 DIAGNOSIS — I6521 Occlusion and stenosis of right carotid artery: Secondary | ICD-10-CM | POA: Diagnosis not present

## 2017-09-28 DIAGNOSIS — I10 Essential (primary) hypertension: Secondary | ICD-10-CM | POA: Diagnosis not present

## 2017-09-28 DIAGNOSIS — I714 Abdominal aortic aneurysm, without rupture: Secondary | ICD-10-CM | POA: Diagnosis not present

## 2017-10-16 DIAGNOSIS — I2510 Atherosclerotic heart disease of native coronary artery without angina pectoris: Secondary | ICD-10-CM | POA: Diagnosis not present

## 2017-10-16 DIAGNOSIS — J84112 Idiopathic pulmonary fibrosis: Secondary | ICD-10-CM | POA: Diagnosis not present

## 2017-10-26 ENCOUNTER — Encounter: Payer: Self-pay | Admitting: Pulmonary Disease

## 2017-10-26 ENCOUNTER — Ambulatory Visit: Payer: Medicare Other | Admitting: Pulmonary Disease

## 2017-10-26 ENCOUNTER — Ambulatory Visit (INDEPENDENT_AMBULATORY_CARE_PROVIDER_SITE_OTHER)
Admission: RE | Admit: 2017-10-26 | Discharge: 2017-10-26 | Disposition: A | Discharge status: Home / Self Care | Payer: Medicare Other | Source: Ambulatory Visit | Attending: Pulmonary Disease | Admitting: Pulmonary Disease

## 2017-10-26 VITALS — BP 122/62 | HR 88 | Temp 97.4°F | Ht 69.0 in | Wt 180.6 lb

## 2017-10-26 DIAGNOSIS — J84112 Idiopathic pulmonary fibrosis: Secondary | ICD-10-CM

## 2017-10-26 DIAGNOSIS — J9611 Chronic respiratory failure with hypoxia: Secondary | ICD-10-CM

## 2017-10-26 DIAGNOSIS — I6523 Occlusion and stenosis of bilateral carotid arteries: Secondary | ICD-10-CM | POA: Diagnosis not present

## 2017-10-26 DIAGNOSIS — I2510 Atherosclerotic heart disease of native coronary artery without angina pectoris: Secondary | ICD-10-CM | POA: Diagnosis not present

## 2017-10-26 DIAGNOSIS — Z8673 Personal history of transient ischemic attack (TIA), and cerebral infarction without residual deficits: Secondary | ICD-10-CM

## 2017-10-26 DIAGNOSIS — J8410 Pulmonary fibrosis, unspecified: Secondary | ICD-10-CM | POA: Diagnosis not present

## 2017-10-26 DIAGNOSIS — Z95828 Presence of other vascular implants and grafts: Secondary | ICD-10-CM

## 2017-10-26 DIAGNOSIS — I714 Abdominal aortic aneurysm, without rupture, unspecified: Secondary | ICD-10-CM

## 2017-10-26 DIAGNOSIS — Z9889 Other specified postprocedural states: Secondary | ICD-10-CM

## 2017-10-26 DIAGNOSIS — G3184 Mild cognitive impairment, so stated: Secondary | ICD-10-CM

## 2017-10-26 NOTE — Patient Instructions (Addendum)
Today we updated your med list in our EPIC system...    Continue your current medications the same...  Today we checked a follow up CXR...    We will contact you w/ the results when available...   Continue your current meds the same...    And stay as active as practical...  Call for any questions...  Be sure to stay current on your flu shots...  Let's plan a follow up visit in 39mo, sooner if needed for problems.Marland KitchenMarland Kitchen

## 2017-10-26 NOTE — Progress Notes (Addendum)
Subjective:     Patient ID: [name] [name], male   DOB: Jan 04, 1930, 84 y.o.   MRN: [id number]  HPI 84 y/o WF w/ mult co-morbidities followed for pulm fibrosis (IPF/UIP) and hypoxemic resp insufficiency- on Prednisone and Home O2...  ~  June 24, 2015:  Initial pulmonary consult by SN>  The Britts are friends/ church members with the [name] [name] family... 84 y/o WM referred by Dr.[name] for further evaluation & treatment of pulmonary fibrosis>  He recently had a vasc surg follow up visit where they noted crackles in the lung bases and referred him back to [name];  He c/o a slight breathing problem w/ SOB/DOE after mowing the yard for 40 min, climbing hills, playing golf but no real change for years he says (stable- not progressive);  He has noted a slight dry cough x1-32yrs and actually saw ENT for eval post nasal drip/ congestion but no better after sinusitis treatment;  Then he saw Allergist- neg testing, no allergies found;  He has occas beige/yellow sput production, denies hx bronchitis/ recurrent infections, and no f/c/s;  He was seen by VVS for f/u CDopplers (they remain patent s/p bilat CAEs) and they noted crackles at L>R lung base;  Pt rechecked by [name] w/ similar findings and she rechecked CXR & PFT (see below) => refer to pulmonary...   Smoking Hx>  He is an ex-smoker; started around age 56, smoked for ~21yrs but always <1ppd; Quit smoking 1966 w/ a 10 pack-year smoking hx...  Pulmonary Hx>  He denies any hx asthma, does not recall treatment for bronchits, never had pneumonia, no hx TB or known exposure;  He has been treated for sinus infections.  Medical Hx>  HBP, vasc dis w/ carotid dis (s/p bilat CAEs) & AAA, TIA/ stroke, HL, prostate ca (s/p seeds), DJD/ spondylosis, mild dementia  Family Hx>  There is no family hx of lung disease;  Father was a smoker & had a heart attack & stroke.  Occup Hx>  He was the Psychologist, educational for [employer]- no known exposure to  asbestos, silica dust, other inorganic or organic dusts/ materials...  Current Meds>  Astelin/ Atrovent nasal, Zyrtek, Plavix, Resveratrol, Crestor, Fish Oil, Vits... EXAM shows Afeb, VSS, O2sat=92% on RA;  HEENT- neg, mallampati2;  Chest- bibasilar velcro rales L>R ~1/3rd the way up post chest wall;  Heart- RR w/o m/r/g;  Abd- soft, nontender, neg;  Ext- neg w/o c/c/e...  CT Angio Abd done 01/02/08>  Chronic interstitial changes noted at the lung bases bilat; fusiform infrarenal AAA at 3.5cm, mod ostial dis of celiac axis origin...   Old CXR 08/15/08 showed norm heart size, patchy opac at the left lung base, otherw neg...  Old CXR 04/18/13 showed norm heart size, sl tortuous Ao, diffuse incr in interstitial markings L>R...  Recent CXR 05/21/15>  Heart at upper lim of normal, prominent coarse interstitial markings bilat L>R, no focal abn & unchanged from 2015 film...  Full PFT 05/29/15> FVC=2.83 (73%), FEV1=2.48 (91%), %1sec=87, mid-flows wnl at 155% predicted; TLC=4.97 (70%), RV=2.08 (76%), RV/TLC=42%; DLCO=23% predicted; c/w a mild restrictive ventilatory defect & severe decr DLCO.   Ambulatory Oximetry 06/24/15>  O2sat=100% on RA at rest w/ pulse=59/min;  He ambulated only 2 Laps in office w/ lowest O2sat=86% w/ pulse=91/min... He indicated that he does NOT want oxygen at this point.  LABS 06/24/15>  Chems- wnl w/ Cr=0.96;  CBC- wnl w/ Hg=13.8;  TSH=4.55;  Sed=34;  CRP=0.8;  RF>600;  CCP=<16;  ANA=neg  CXR 04/18/13                                          CXR 05/21/15     IMP >>     Interstitial Lung Disease-- likely IPF and we will proceed w/ His-res CT Chest, & Preliminary LAB screen...    CARDIAC issues>  53 y/o, HBP, vasc dis w/ bilat carotid dis (s/p bilat CAEs) & AAA (4.1cm), TIA/ stroke, HL; followed by [name] & [name]...    MEDICAL issues>  89 y/o, HBP, vasc dis w/ bilat carotid dis (s/p bilat CAEs) & AAA, TIA/ stroke, HL, prostate ca (s/p seeds), DJD/ spondylosis,  mild dementia; followed by [name] & [name]... PLAN >>     We discussed pulmonary fibrosis and the evaluation- they are familiar w/ this dis since a church friend was recently diagnosed and has since passed away; they are understandably concerned and I pointed out some differences in [name] (ie- he has had this on his CXR w/o much change since 2015);  We will proceed w/ Labs and Hi-res CT Chest => pending.  ADDENDUM>> Hi-res CT CHEST done 07/02/15>  Norm heart size, atherosclerosis of Ao/ coronaries including [place], no adenopathy or lung nodules; extensive subpleural reticulation, traction bronchiectasis, & architectural distortion bilaterally w/ basilar predominance, moderate honeycombing => all favors UIP diagnosis... I called pt & wife w/ report & they will think about options and f/u OV 07/11/15 to discuss further...   ~  July 11, 2015:  2wk ROV w/ SN>  As above I have prev discussed his results to date w/ pt & wife & layed out his options for treating his UIP/ IPF => specifically I outlined a 3-prong approach w/ Aggressive/ Middle-of-the-road/ Conservative options;  They have considered each of these approaches and discussed w/ their children => they have chosen the 2nd option for a middle-of-the-road approach, and in this regard they feel that additional collagen-vasc blood work would not add substantially to his treatment options, and they do not wish to pursue antifibrotic therapy w/ Ofev or Esbriet;  We decided to start pt on a low dose of Pred 10mg /d and he is cautioned to elim salt from their diet, & watch caloric intake;  We plan ROV in 6-8 weeks w/ CXR & Labs at that time...   Ambulatory Oximetry 07/11/15>  O2sat=95% on RA at rest w/ pulse=74/min;  He ambulated 3 Laps (185'ea) w/ lowest O2sat=81% w/ pulse=103/min;  He qualifies for Home Oxygen (rec 2L/min w/ exercise) but he declines...  ONO => pending => this was NOT completed. IMP/PLAN>>  [name] needs to start on HomeO2-POC for  oxygen therapy w/ activities but he is reluctant to do so & declines my recommendation to start O2 now; he notes that he is asymptomatic- plays golf 2d/wk, mows yard, walks, etc and denies SOB, wife is not so sure (he is very stoic and has "mild cognitive impairment"); we decided to check the ONO & then proceed w/ Home O2 as indicated...  ~  August 27, 2015:  6wk ROV w/ SN>  Rush Landmark returns or f/u of his IPF/ probUIP after starting o Pred10 last visit, and reports a good interval, as noted he is stoic & has mild cognitive impairment; he is currently taking PREDNISONE 10mg /d & tol well, has Home O2 at 2L/min (doesn't like it & compliance is fair); he continues to golf &  do yard work- he does not use the port O2 despite desat w/ exercise- he just takes his time & uses rest stops...    EXAM shows Afeb, VSS, O2sat=92% on RA;  HEENT- neg, mallampati2;  Chest- bibasilar velcro rales L>R ~1/3rd the way up post chest wall;  Heart- RR w/o m/r/g;  Abd- soft, nontender, neg;  Ext- neg w/o c/c/e...  CXR 08/27/15 (independently reviewed by me in the PACS system) showed norm heart size, tortuous Ao, stable bilat fibrotic changes, NAD...  LABS 08/27/15>  Chems- ok x HCO3=33;  CBC- ok x wbc=12.1K... IMP/PLAN>>  Rush Landmark is stable on the [place] well; we wrote for a handicap placard; discussed asking [employer] company for a POC to encourage using oxygen w/ activities... we plan ROV in 82mo.  ~  December 03, 2015:  35mo ROV & [name] notes that his breathing is doing satis, no real change in DOE etc but he has been coughing more, small amt yellow sput, no hemoptysis, no f/c/s, etc;  In the interval he had a UTI- seen in ER w/ fever & some confusion & treated w/ Keflex;  He saw [name] Harriett Sine for f/u- Hx stroke on Plavix, mild cognitive impairment on Cerefolin, felt to be stable;  He also had allergy f/u DrWhelan- on Zyrtek & Astelin...    EXAM is unchanged- Afeb, VSS, O2sat=93% on RA;  HEENT- neg, mallampati2;  Chest- bibasilar velcro  rales L>R ~1/3rd the way up;  Heart- RR w/o m/r/g;  Abd- soft, nontender, neg;  Ext- neg w/o c/c/e... IMP/PLAN>>  Given 2017 Flu vaccine today, rec to add Delsym- 2tsp Bid for cough...  Pulm fibrosis- likely UIP, Exercise induced hypoxemia> continue Pred10, O2 at 2L/min Qhs & w/ exercise, low sodium low carb diet...  Cardiac issues- 84 y/o, HBP, ASPVD w/ bilat-CAEs (Left 2008, Right 2015), 4cm AAA, TIA/stroke> followed by [name] & [name].  Medical issues- as above + HL, prostate ca (s/p seeds), DJD/spondylosis, MCI/dementia> followed by [name] & [name].  ~  January 22, 2016:  6wk ROV & add-on appt requested for worsening cough>  Rush Landmark presents c/o 3 wk hx cough- "a deep intense cough", intermittently prod of a small amt of yellow sput, some incr SOB noted due to the coughing paroxysms but he denies CP or f/c/s; of note the cough seems worse at night (but not every night);  He has been taking O2 at 2L/min Qhs & prn w/ exercise during the day, Pred10/d, GFN-400 2Bid w/ fluids, Delsym prn;  The episode sounds like an IPF exacerbation & we discussed Rx w/ Depo20, incr Pred to 20mg /d for 2wks then back to 10mg /d regular dose, LEVAQUIN 500mg x7d, Align daily & Hydromet cough syrup prn;  We also discussed an antireflux regimen for the nocturnal cough- Protonix40 before dinner, NPO after dinner in the eve, elev HOB 6"...    EXAM is unchanged- Afeb, VSS, O2sat=91% on RA;  HEENT- neg, mallampati2;  Chest- bibasilar velcro rales L>R ~1/3rd the way up (of note: pt did not cough once during the OV;  Heart- RR w/o m/r/g;  Abd- soft, nontender, neg;  Ext- neg w/o c/c/e... IMP/PLAN>>  This episode may have been triggered by an upper resp infection, nocturnal reflux and LPR, or an IPF exac-- rec treatment as above w/ O2, Depo/Pred bump, Levaquin, Mucinex, Hydromet, plus we initiated a vigorous antireflux protocol w/ PPI before dinner, NPO after dinner, elev HOB 6"... He has a regular sched OV in mid-Jan & we will  recheck  pt at that time...  ~  April 20, 2016:  40mo ROV & pulmonary follow up visit> pt states that he improved after the last visit w/ IPF exac, treated w/ Levaquin & Pred bump, & pt feels that he is back to his baseline; wife is not so sure w/ incr SOB while taking out the trash & simple chores (esp if he tries it w/o his oxygen);  He denies CP, palpit, and feels that his SOB is about the same w/ exertion; mild dry cough esp at night, no sput, no hemoptysis...    Pulm fibrosis- likely UIP, Hypoxemic respiratory failure> on O2 2L/min w/ exercise & Qhs, but encouraged to use it 24/7, and PRED 10mg  Qam; he has Zyrtek & Astelin for sinuses...     Cardiac issues- 84 y/o, HBP, ASPVD w/ bilat-CAEs (Left 2008, Right 2015), 4cm AAA, TIA/stroke> followed by [name] & [name].    Medical issues- as above + HL, prostate ca (s/p seeds), DJD/spondylosis, MCI/dementia> followed by [name] & [name].    EXAM shows Afeb, VSS, O2sat=94% on 3L pulse dose POC;  HEENT- neg, mallampati2;  Chest- bibasilar velcro rales L>R ~1/3rd the way up (of note: pt did not cough once during the OV);  Heart- RR w/o m/r/g;  Abd- soft, nontender, neg;  Ext- neg w/o c/c/e; Neuro- intact x mild memory impairment... IMP/PLAN>>  Rush Landmark is +- stable and says he's doing OK; wife notes incr SOB w/ activities while not using the O2 eg- taking out the trash etc; he has a set routine & he seems to do ok; still drives some & she is asking for guidelines so we discussed not driving if S2GBT DV<76%; he recovered satis from the prev acute exac (using Levaquin & bump Pred) but new baseline is sl lower; Rec to continue the O2 w/ all activities, & the Pred 10mg /d; he will try to gradually incr exercise & expand his lungs; continue vigorous antireflux regimen which we reviewed today... we plan ROV in 14mo, sooner if needed prn.  ~  Jul 20, 2016:  21mo ROV & [name] was HOSP by [facility]-DrCain 3/29 - 06/18/16 for a 5.9cm AAA w/ Aorto bi-iliac endovasc stent graft; he  did well & has recovered from the surg; they increased his O2 to 3L/min after the surg... We reviewed the following medical problems during today's office visit >>     Pulm fibrosis- likely UIP, Hypoxemic respiratory failure> on O2 3L/min w/ exercise & Qhs, but encouraged to use it 24/7, and PRED 10mg  Qam; he has Zyrtek & Astelin for sinuses...     Cardiac issues- 84 y/o, HBP, Cardiomyopathy w/ EF=45-50% & diffuse HK + Gr2DD (2DEcho 1/15), ASPVD w/ bilat-CAEs (Left 2008, Right 2015), 4cm AAA, TIA/stroke> followed by Youlanda Mighty; on ASA81/d...    Medical issues- as above + HL (on Cres5), prostate ca (s/p seeds), DJD/spondylosis, MCI/dementia> followed by [name] & [name]=> seen 05/04/16- hx stroke & MCI, signif bruising therefore stopped Plavix, cont ASA; continue Cerefolin & Resveratrol...    EXAM shows Afeb, VSS, O2sat=100% on 3L pulse dose POC;  HEENT- neg, mallampati2;  Chest- bibasilar velcro rales L>R ~1/3rd the way up (of note: pt did not cough once during the OV);  Heart- RR w/o m/r/g;  Abd- soft, nontender, neg;  Ext- neg w/o c/c/e; Neuro- intact x mild memory impairment...  Ambulatory oximetry on 3L/min [place]>  O2sat=93% on 3L/min oxygen at rest w/ pulse=63/min;  He ambulated 2 Laps (185'ea) & O2 nadir at peak exercise  was 73% w/ pulse=105/min... Rec to incr O2 to 4-5L transiently after exercise, then back to his baseline 3L/min...  LABS 07/20/16>  Chems- wnl;  CBC- ok w/ Hg=12.8, WBC=12K;  TSH=3.94;   IMP/PLAN>>  Rush Landmark has gone thru major surg w/ Ao/Bi-iliac stent graft;  His IPF has deteriorated & I wonder if her had an exacerbation during this time? Now on 3L/min & we have maintained Pred 10mg /d...  ~  October 21, 2016:  68mo ROV & pulm follow up> Rush Landmark doesn't feel that the 3/18 surg caused any set-back but wife thinks that he is sl worse;  Pt states that O2 sats are "good" in the 90's on 3L/min pulse-dose; he denies SOB! But has difficulty taking out the trash cans, says he golfs some w/ O2  in the cart & uses it prn;  He notes sl cough, min beige sput, no hemoptysis, no f/c/s, no CP, etc...     He saw VascSurg- DrCain 07/23/16>  Hx bilat CAE's and endovasc repair of AAA (3/18- Aorto Bi-iliac endovascular stent graft);  CT Angio Abd & Pelvis 07/20/16 reviewed- patent aortic stent graft, small endoleak visualized & they are following, Scan also shows severe anterolisthesis at L5-S1, extensive diverticulosis, interstitial fibrosis at lung bases...    Pulm fibrosis- likely UIP, Hypoxemic respiratory failure> on O2 3L/min w/ exercise & Qhs, but encouraged to use it 24/7, and PRED 10mg  Qam; he has Zyrtek & Astelin for sinuses...     Cardiac issues- 84 y/o, HBP, Cardiomyopathy w/ EF=45-50% & diffuse HK + Gr2DD (2DEcho 1/15), ASPVD w/ bilat-CAEs (Left 2008, Right 2015), 4cm AAA, TIA/stroke> followed by Youlanda Mighty; on ASA81/d...    Medical issues- as above + HL (on Cres5), prostate ca (s/p seeds), DJD/spondylosis, MCI/dementia> followed by [name] & [name]=> seen 05/04/16- hx stroke & MCI, signif bruising therefore stopped Plavix, cont ASA; continue Cerefolin & Resveratrol...    EXAM shows Afeb, VSS, O2sat=94% on 3L pulse dose POC;  HEENT- neg, mallampati2;  Chest- bibasilar velcro rales L>R ~1/2 the way up (of note: pt did not cough once during the OV);  Heart- RR w/o m/r/g;  Abd- soft, nontender, neg;  Ext- neg w/o c/c/e; Neuro- intact x mild memory impairment...  CXR 10/21/16 (independently reviewed by me in the PACS system) showed stable cardiomeg, diffuse bilat interstitial prom & elev left hemidiaph, Tspine scoliosis, no acute changes...  IMP/PLAN>>  Rush Landmark is stable w/ his severe disease- pulm, cardiovasc, etc;  Continue same meds & we plan routine recheck 4mo...  ~  February 22, 2017:  42mo ROV & pt reports doing about the same- he remains on 3L/min O2, mild cough w/ clear phlegm, & he thinks breathing is better but wife says no; his CC is DOE while carrying a load, denies CP & he indicates  that [place] said he was in great shape, [facility]-DrCain noted small leak (low risk), [name] rec to use Luminosity, PCP-Dr. Kelton Pillar did labs & they were ok... We reviewed the following interval Epic notes>      He saw NEURO- [name] 12/01/16>  Mild cognitive impairment, hx stroke 03/2013 (CT/MRI showed subacute lacunar infarct in R-internal capsule region, 2DEcho w/ 45-50% EF w/ diffuse HK, & CDoppler w/ 80-99% R-ICA stenosis=> R-CAE performed by [facility]);  Short term memory deficits felt to be stable, on ASA, cerefolin & reservatrol + mentally challenging exercises, and memory compensation strategies...    He saw [facility]- DrNishan 12/16/16>  Hx CAD & ASPVD- s/p bilat CAE & EVAR of  AAA, nonischemic myoview 05/2013 w/ EF=56%; he was felt to be stable, no change in meds, f/u planned 74yr...    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     He says that he is due for a shot in his SI joint for LBP... We reviewed the following medical problems during today's office visit>      Pulm fibrosis- likely UIP w/ Hypoxemic respiratory failure> on O2 3L/min w/ exercise & Qhs, but encouraged to use it 24/7, and PRED 10mg  Qam; he has Zyrtek & Astelin for sinuses...     Cardiac issues- 84 y/o, HBP, Cardiomyopathy w/ EF=45-50% & diffuse HK + Gr2DD (2DEcho 1/15), ASPVD w/ bilat-CAEs (Left 2008, Right 2015), TIA/stroke, 5.9cm AAA- s/p endovasc surg 3/18> followed by [name] & [name]/ Donzetta Matters; on ASA81/d and monitored carefully w/ seriasl CT scan due to endoleak...Marland KitchenMarland KitchenMarland Kitchen    Medical issues- as above + HL (on Cres5), prostate ca (s/p seeds), DJD/spondylosis, MCI/dementia> followed by [name] & [name]=> seen regularly- hx stroke & MCI, signif bruising therefore stopped Plavix, cont ASA; continue Cerefolin & Resveratrol...    EXAM shows Afeb, VSS, O2sat=94% on 3L  pulse dose POC;  HEENT- neg, mallampati2;  Chest- bibasilar velcro rales L>R ~1/2 the way up (of note: pt did not cough once during the OV);  Heart- RR w/o m/r/g;  Abd- soft, nontender, neg;  Ext- neg w/o c/c/e; Neuro- intact x mild memory impairment...  CT Angio of Abd/Pelvis 02/03/17>  IMPRESSION:  VASCULAR-- 1. Stable aneurysm sac size of 5.1 x 5.9 cm. Mild type 2 endoleak remain suspected based on increased density within the inferior right aspect of the aneurysm sac on delayed imaging. Outflow remain suspected via the accessory lower pole right renal artery based on delayed perfusion of the right lower kidney. Inflow is more difficult to assess on the current study due to significant respiratory motion on the arterial phase scan. Lumbar inflow remains the most likely source of type 2 endoleak.  2. Mild aneurysmal disease of the native proximal aorta remains stable measuring 3.3 cm in greatest diameter just below the SMA origin.   NON-VASCULAR-- Stable fibrotic lung disease at the lung bases and severe degenerative disc disease at L5-S1 with associated anterolisthesis. IMP/PLAN>>  Rush Landmark is overall stable w/ his severe atherosclerotic vasc dis, mild cognitive impairment/ vasc dementia, and pulm fibrosis on O3 at [place];  We decided to continue same, gave him a Rx for  ZPak prn use this winter, & rec to stay active, call for any problems...   ~  June 23, 2017:  34mo ROV & pulmonary follow up visit>  Rush Landmark indicates that his breathing is OK, notes SOB w/ most activities- carrying objects, taking out the trash, etc; he has played 1 round of golf so far this yr; denies CP/ palpit/ dizzy/ edema; notes some cough, yellowish phlegm, but no blood, not tight, no wheezing, no f/c/s, GI is ok; he is still too sedentary & asked to incr PT at home, more exercise... His saw his PCP- DrEGriffin ~88mo ago w/ labs done & reported ok (they are again asked to get copies sent to [place]); he is up to date on pneumonia & Flu  vaccines, he did not need the ZPak this winter...  We reviewed interval notes in Epic>  He saw NEURO-CMatin,NP on 05/31/17>  F/u stroke 2015 & MCI; supposed to be on ASA81 daily but wasn't taking this=> told to restart; denies cerebral ischemic symtoms; he is minimally active, but does computer games & was prev rec to try Luminosity;  MMSE slowly dropped- now at 26/30; they have not rec to start Aricept etc... We reviewed the following medical problems during today's office visit>      Pulm fibrosis- likely UIP w/ Hypoxemic respiratory failure> on O2- 3L/min w/ exercise & Qhs, but encouraged to use it 24/7, and PRED 10mg  Qam; he has Zyrtek & Astelin for sinuses...     Cardiac issues- 84 y/o, HBP, Cardiomyopathy w/ EF=45-50% & diffuse HK + Gr2DD (2DEcho 1/15), ASPVD w/ bilat-CAEs (Left 2008, Right 2015), TIA/stroke, 5.9cm AAA- s/p endovasc surg 3/18> followed by [name] & [name]/ Donzetta Matters; on ASA81/d and monitored carefully w/ serial CT scans due to endoleak...Marland KitchenMarland KitchenMarland Kitchen    Medical issues- as above + HL (on Cres5), prostate ca (s/p seeds), DJD/spondylosis, MCI/dementia> followed by [name] & [name]=> seen regularly- hx stroke & MCI, signif bruising therefore stopped Plavix, cont ASA; continue Cerefolin & Resveratrol...    EXAM shows Afeb, VSS, O2sat=96% on 3L pulse dose POC;  HEENT- neg, mallampati2;  Chest- bibasilar velcro rales L>R ~1/2 the way up (of note: pt did not cough during the OV);  Heart- RR w/o m/r/g;  Abd- soft, nontender, neg;  Ext- neg w/o c/c/e; Neuro- intact x mild memory impairment... IMP/PLAN>>  Rush Landmark is stable overall, he has been taking his ASA81 & we will add this to his med list; LABS were done by his PCP & they will get copies for [place] to scan into Epic; Breathing appears to be about at baseline- he has signif pulm fibrosis, O2 dependent, & is too sedentary- again asked to incr his activ level; REC to continue same meds & call for any questions...    ~  October 26, 2017:  53mo ROV & Rush Landmark  indicates that he is stable- on his regimen of Pred10Qam, Mucinex600Bid, Zyrtek10 & Astelin nasal spray prn; he says his breathing is stable, min cough, occ light yellow sput, no hemoptysis, not SOB at rest or w/ ADLs- just w/ walking & that is when he uses his portO2- 3L/min w/ ambulation AND Qhs too... He denies CP, palpit, dizzy, edema; he remains sedentary despite all efforts to get him up & exercising... We reviewed interval notes in Epic>     He saw VascSurg- SNickel,NP on 09/27/17> s/p aorto bi-iliac endovasc stent graft 05/2017 by DrCain for a 6cm AAA; he has a stable type II endoleak size to 5.9 cm; he denies abd or back pain; he also has hx stoke in 2008 and a L-CAE was performed, he had another TIA in 03/2013 & had a R-CAE; he is followed by [name] w/ mild dementia;  Duplex exam showed patent bilat CAE sites & no change from prev exams;  The AAA exam was stable- AAA sac 6.0cm, R-CIA 1.5cm, L-CIA 1.3cm, extra-stent flow in the resid AAA sac consistent w/ type 2 endoleak; Cr=1.03; they plan f/u 56yr... 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     Cardiac issues- 84 y/o (birthday today), HBP, Cardiomyopathy w/ EF=45-50% & diffuse HK + Gr2DD (2DEcho  1/15), ASPVD w/ bilat-CAEs (Left 2008, Right 2015), TIA/stroke, 6.0cm AAA- s/p endovasc surg 05/2016> followed by [name] & [name]/ Donzetta Matters; on ASA81/d and monitored carefully w/ serial CT scans due to endoleak...Marland KitchenMarland KitchenMarland Kitchen    Medical issues- as above + HL (on Cres5), prostate ca (s/p seeds), DJD/spondylosis, MCI/dementia> followed by [name] & [name]=> seen regularly- hx stroke & MCI, signif bruising therefore stopped Plavix, cont ASA; continue Cerefolin & Resveratrol...    EXAM shows Afeb, VSS, O2sat=92% on RA- he has pulse dose POC for  ambulation;  HEENT- neg, mallampati2;  Chest- bibasilar velcro rales L>R ~1/2 the way up (of note: pt did not cough during the OV);  Heart- RR w/o m/r/g;  Abd- soft, nontender, neg;  Ext- neg w/o c/c/e; Neuro- intact x mild memory impairment...  CXR 10/26/17 (independently reviewed by me in the PACS system) shows stable cardiomegaly, bilat diffuse pulm fibrosis w/o focal opacity/ effusion/ etc- stable compared to prev XRays... IMP/PLAN>>  Rush Landmark is stable- pulm fibrosis, ischemic cardiomyopathy, extensive atherosclerotic vasc dis, & medical issues w/ HBP/ HL/ prostate cancer/ DJD & spondylosis/ hx stoke & dementia; we have maintined him on Pred10, O2 encouraged 24/7 and he is clinically & roentgenographically stable- continue same Rx & f/u in 4 months for recheck in advance of my retirement...    Past Medical History:  Diagnosis Date  . AAA (abdominal aortic aneurysm) ([place])   . Allergy   . Carotid artery stenosis   . Diplopia   . GERD (gastroesophageal reflux disease)   . Hyperlipidemia   . Memory loss   . Prostate cancer ([place])   . Pulmonary fibrosis ([place])   . Spondylisthesis 2015   Spine  . Stroke ([place]) 04/18/13   some weakness left-returning strength  . TIA (transient ischemic attack)     Past Surgical History:  Procedure Laterality Date  . ABDOMINAL AORTIC ENDOVASCULAR STENT GRAFT N/A 06/17/2016   Procedure: ABDOMINAL AORTIC ENDOVASCULAR STENT GRAFT;  Surgeon: Waynetta Sandy, MD;  Location: [facility] OR;  Service: Vascular;  Laterality: N/A;  . CAROTID ENDARTERECTOMY  Sept. 2008   Elective left carotid endarterectomy  . CAROTID ENDARTERECTOMY Right 04/27/2013   CE  . ENDARTERECTOMY Right 04/27/2013   Procedure: ENDARTERECTOMY CAROTID;  Surgeon: Mal Misty, MD;  Location: [facility];  Service: Vascular;  Laterality: Right;  . EYE SURGERY     Bilateral cataract   . FOOT SURGERY      left Achilles Tendon Repair  . seed implantation  2001   for prostate ca    Outpatient Encounter  Medications as of 10/26/2017  Medication Sig  . acetaminophen (TYLENOL) 500 MG tablet Take 500 mg by mouth every 6 (six) hours as needed for mild pain.  Marland Kitchen aspirin 81 MG tablet Take 81 mg by mouth daily.  Marland Kitchen azelastine (ASTELIN) 0.1 % nasal spray Place 2 sprays into both nostrils 2 (two) times daily.   . cetirizine (ZYRTEC) 10 MG tablet Take 10 mg by mouth daily as needed for allergies.   Marland Kitchen dextromethorphan-guaiFENesin (MUCINEX DM) 30-600 MG 12hr tablet Take 1 tablet by mouth 2 (two) times daily.  Marland Kitchen L-Methylfolate-B12-B6-B2 (CEREFOLIN) 08-20-48-5 MG TABS Take 1 tablet by mouth daily.  . Multiple Vitamins-Minerals (CENTRUM SILVER PO) Take 1 tablet by mouth daily.   . naproxen sodium (ANAPROX) 220 MG tablet Take 220 mg by mouth daily as needed (pain).  . Omega-3 Fatty Acids (FISH OIL PO) Take 1 capsule by mouth daily.  . pantoprazole (PROTONIX) 40 MG tablet TAKE 1 TABLET  DAILY,30 MINUTES BEFORE DINNER.  Marland Kitchen predniSONE (DELTASONE) 10 MG tablet TAKE 1 TABLET EVERY DAY WITH BREAKFAST.  Marland Kitchen Resveratrol 100 MG CAPS Take 100 mg by mouth every evening.   . rosuvastatin (CRESTOR) 5 MG tablet Take 5 mg by mouth at bedtime.   Facility-Administered Encounter Medications as of 10/26/2017  Medication  . aspirin EC tablet 81 mg    Allergies  Allergen Reactions  . Sudafed [Pseudoephedrine Hcl]     Causes dizziness and nervousness  . Atorvastatin Other (See Comments)    Reaction: Patient's family reports "confusion" and would prefer crestor     Immunization History  Administered Date(s) Administered  . Influenza, High Dose Seasonal PF 12/03/2015, 12/23/2016  . Influenza-Unspecified 12/21/2014  . Pneumococcal Conjugate-13 02/28/2014  . Pneumococcal Polysaccharide-23 03/23/1999    Current Medications, Allergies, Past Medical History, Past Surgical History, Family History, and Social History were reviewed in [employer] record.   Review of Systems             All symptoms NEG except  where BOLDED >>  Constitutional:  F/C/S, fatigue, anorexia, unexpected weight change. HEENT:  HA, visual changes, hearing loss, earache, nasal symptoms, sore throat, mouth sores, hoarseness. Resp:  cough, sputum, hemoptysis; SOB, tightness, wheezing. Cardio:  CP, palpit, DOE, orthopnea, edema. GI:  N/V/D/C, blood in stool; reflux, abd pain, distention, gas. GU:  dysuria, freq, urgency, hematuria, flank pain, voiding difficulty. MS:  joint pain, swelling, tenderness, decr ROM; neck pain, back pain, etc. Neuro:  HA, tremors, seizures, dizziness, syncope, weakness, numbness, gait abn. Skin:  suspicious lesions or skin rash. Heme:  adenopathy, bruising, bleeding. Psyche:  Memory loss, confusion, agitation, sleep disturbance, hallucinations, anxiety, depression, suicidal.   Objective:   Physical Exam       Vital Signs:  Reviewed...   General:  WD, WN, 84 y/o WM in NAD; alert & oriented; pleasant & cooperative... HEENT:  [place]/AT; Conjunctiva- pink, Sclera- nonicteric, EOM-wnl, PERRLA, EACs-clear, TMs-wnl; NOSE-clear; THROAT-clear & wnl.  Neck:  Supple w/ fair ROM; no JVD; normal carotid impulses w/o bruits; no thyromegaly or nodules palpated; no lymphadenopathy.  Chest:  Bibasilar crackles ~1/3rd way up the back;  No wheezing, rhonchi, signs of consolidation... Heart:  Regular Rhythm; norm S1 & S2 without murmurs, rubs, or gallops detected. Abdomen:  Soft & nontender- no guarding or rebound; normal bowel sounds; no organomegaly or masses palpated. Ext:  Normal ROM; without deformities or arthritic changes; no varicose veins, venous insuffic, or edema;  Pulses intact w/o bruits. Neuro:  No focal neuro deficits; sensory testing normal; gait normal & balance OK. Derm:  No lesions noted; no rash etc. Lymph:  No cervical, supraclavicular, axillary, or inguinal adenopathy palpated.   Assessment:      IMP >>     Interstitial Lung Disease-- UIP/ IPF w/ characteristic Hi-res CT Chest findings and  collagen-vasc labs showing elev Rheum Factor>600 & Sed=34, otherw neg Anti-CCP/ ANA/ & CRP    CARDIAC issues>  82 y/o, HBP, vasc dis w/ bilat carotid dis (s/p bilat CAEs) & AAA (5.9cm- s/p endovasc repair 05/2016 by Loel Dubonnet), TIA/ stroke, HL; followed by [name] & [name]/ Donzetta Matters...    MEDICAL issues>  29 y/o, HBP, vasc dis w/ bilat carotid dis (s/p bilat CAEs) & AAA, TIA/ stroke, HL, prostate ca (s/p seeds), DJD/ spondylosis, mild dementia; followed by [name] & [name]...  PLAN >>  06/24/15>   We discussed pulmonary fibrosis and the evaluation- they are familiar w/ this dis since a church friend  was recently diagnosed and has since passed away; they are understandably concerned and I pointed out some differences in [name]'s presentation (ie- he has had this on his CXR w/o much change since 2015);  We will proceed w/ Labs and Hi-res CT Chest... 07/11/15>   Pt & family have opted for a middle-of-the-road approach and declined further testing & Antifibrotic therapy; he appears to need O2 rx w/ activities now but is reluctant to accept this (we will check ONO & make final recommendation);  Plus we will give him a trial of Pred 10mg /d... 08/27/15>   [name] is stable on the [place] well; we wrote for a handicap placard; discussed asking [employer] for a POC to encourage using oxygen w/ activities. 12/03/15>   Stable IPF/UIP on Pred10 + home O2, continue same; given 2017 Flu vaccine today, rec to add Delsym- 2tsp Bid for cough... 02/10/16>   This episode may have been triggered by an upper resp infection, nocturnal reflux and LPR, or an IPF exac-- rec treatment as above w/ O2, Depo/Pred bump, Levaquin, Mucinex, Hydromet, plus we initiated a vigorous antireflux protocol w/ PPI before dinner, NPO after dinner, elev HOB 6". 07/20/16>   [name] has gone thru major surg w/ Ao/Bi-iliac stent graft;  His IPF has deteriorated & I wonder if her had an exacerbation during this time? Now on 3L/min 7 we have maintained  Pred 10mg /d.. 10/21/16>   Stable- continue same meds. 02/22/17>   [name] is overall stable w/ his severe atherosclerotic vasc dis, mild cognitive impairment/ vasc dementia, and pulm fibrosis on O3 at [place];  We decided to continue same, gave him a Rx for  ZPak prn use this winter, & rec to stay active, call for any problems.  06/23/17>   [name] is stable overall, he has been taking his ASA81 & we will add this to his med list; LABS were done by his PCP & they will get copies for [place] to scan into Epic; Breathing appears to be about at baseline- he has signif pulm fibrosis, O2 dependent, & is too sedentary- again asked to incr his activ level; REC to continue same meds & call for any questions. 10/26/17>   [name] is stable- pulm fibrosis, ischemic cardiomyopathy, extensive atherosclerotic vasc dis, & medical issues w/ HBP/ HL/ prostate cancer/ DJD & spondylosis/ hx stoke & dementia; we have maintined him on Pred10, O2 encouraged 24/7 and he is clinically & roentgenographically stable- continue same Rx & f/u in 4 months for recheck in advance of my retirement.   Plan:     CONTINUE HOME OXYGEN at 3L/min regularly...  Patient's Medications  New Prescriptions   No medications on file  Previous Medications   ACETAMINOPHEN (TYLENOL) 500 MG TABLET    Take 500 mg by mouth every 6 (six) hours as needed for mild pain.   ASPIRIN 81 MG TABLET    Take 81 mg by mouth daily.   AZELASTINE (ASTELIN) 0.1 % NASAL SPRAY    Place 2 sprays into both nostrils 2 (two) times daily.    CETIRIZINE (ZYRTEC) 10 MG TABLET    Take 10 mg by mouth daily as needed for allergies.    DEXTROMETHORPHAN-GUAIFENESIN (MUCINEX DM) 30-600 MG 12HR TABLET    Take 1 tablet by mouth 2 (two) times daily.   L-METHYLFOLATE-B12-B6-B2 (CEREFOLIN) 08-20-48-5 MG TABS    Take 1 tablet by mouth daily.   MULTIPLE VITAMINS-MINERALS (CENTRUM SILVER PO)    Take 1 tablet by mouth  daily.    NAPROXEN SODIUM (ANAPROX) 220 MG TABLET    Take 220 mg by mouth daily as  needed (pain).   OMEGA-3 FATTY ACIDS (FISH OIL PO)    Take 1 capsule by mouth daily.   PANTOPRAZOLE (PROTONIX) 40 MG TABLET    TAKE 1 TABLET DAILY,30 MINUTES BEFORE DINNER.   PREDNISONE (DELTASONE) 10 MG TABLET    TAKE 1 TABLET EVERY DAY WITH BREAKFAST.   RESVERATROL 100 MG CAPS    Take 100 mg by mouth every evening.    ROSUVASTATIN (CRESTOR) 5 MG TABLET    Take 5 mg by mouth at bedtime.  Modified Medications   No medications on file  Discontinued Medications   No medications on file

## 2017-10-28 ENCOUNTER — Other Ambulatory Visit: Payer: Self-pay | Admitting: Pulmonary Disease

## 2017-11-16 DIAGNOSIS — I2510 Atherosclerotic heart disease of native coronary artery without angina pectoris: Secondary | ICD-10-CM | POA: Diagnosis not present

## 2017-11-16 DIAGNOSIS — J84112 Idiopathic pulmonary fibrosis: Secondary | ICD-10-CM | POA: Diagnosis not present

## 2017-11-25 DIAGNOSIS — R410 Disorientation, unspecified: Secondary | ICD-10-CM | POA: Diagnosis not present

## 2017-11-25 DIAGNOSIS — R300 Dysuria: Secondary | ICD-10-CM | POA: Diagnosis not present

## 2017-12-06 ENCOUNTER — Other Ambulatory Visit: Payer: Self-pay | Admitting: Pulmonary Disease

## 2017-12-06 NOTE — Progress Notes (Addendum)
[facility]  PATIENT: [name] [name] DOB: 06/17/1929   REASON FOR VISIT: Follow-up for history of stroke, mild cognitive impairment  HISTORY FROM: Patient and wife   HISTORY OF PRESENT ILLNESS: 84 year Caucasian male seen for first office followup visit from hospital consideration for stroke in January 2015. He presented with difficulty picking things up with his left hand as well as grip weakness while playing golf. He also subsequently diescribed, headache and some speech and language difficulties and double vision. His symptoms improved soon after arrival and hence he was not considered for thrombolysis. CT scan of the head showed an area of low attenuation in the right anterior limb internal capsule suggestive of a subacute lacunar infarct which was subsequently confirmed on MRI . 2D echo showed ejection fraction of 45-50% with diffuse hypokinesis. Carotid Doppler showed progressive 80-99% right ICA stenosis and 1-39% left ICA stenosis at the previous surgical site. Vascular surgery was consulted . Patient was started on aspirin. Patient was scheduled for and underwent elective right carotid endarterectomy by [name] [name] and the procedure went well. Patient was placed on Plavix by me but after the surgery was changed to aspirin. He states his blood pressure is fine and is 103/65 today and it usually runs in the 120s at home. The patient's wife has noted that he has subacute decline in his memory as well as multitasking and cognitive abilities. She is concerned about this and would like evaluation. The patient himself denies this. He has not had an evaluation for these complaints before.   UPDATE 08/27/13 (LL): Since last visit, patient has continued taking Cerefolin NAC for memory. He does not notice any change in his memory, but states he knows his short term memory is not as good as it used to be. He is tolerating Plavix well with no signs of significant bleeding or bruising. His  blood pressure is well controlled, it is 125/79 in the office today. He states he stays active, plays golf a couple days a week. Update 05/10/2014 PShe returns for follow-up accompanied by his wife. He states his memory difficulties are unchanged. He in fact is doing well and is still able to handle finances for the home and business. He is taking fish oil as well as Cerefolin everyday. The patient also remains on Plavix which is tolerating well without bleeding or bruising. He had lipid profile checked in December 2015 by primary physician which was fine. He also had follow-up carotid ultrasound done last week by [name]. Kellie [name] which was okay as well. He exercises regularly and plays golf as well. He has no new complaints. He has not had any recurrent stroke or TIA symptoms. Update 11/05/2014:PS He returns for follow-up after last visit 6 months ago. He is accompanied by his wife. He states his short-term memory difficulties continue. He has occasional fecal times in judgment and multitasking. However overall his symptoms seem stable. He continues to take Cerefolin daily. He does participate in doing sudoku several times a week. He does play golf twice a week. He did have a follow-up carotid ultrasound checked in February and [name]. Evelena [name] office and it was fine. He remains on Plavix which is tolerating well without bleeding or bruising. He has not had any recurrent stroke or TIA symptoms. He remains on Crestor without any myalgias or arthralgias. His blood pressure is elevated today in office at 150/93 but he blames this on white coat hypertension. He did have lipid profile checked 2 months ago by  [name]. Laurann [name] and it was fine.  UPDATE 05/06/2015 CMMr. [name], 84 year old male returns for follow-up. He is accompanied by his wife [name] [name]. He has not had further stroke or TIA symptoms since last seen. He is currently on Plavix with minimal bruising. His memory has not progressed since last seen. He continues to play golf  several times a week and participates in card games, and sudoku. He remains on Cerefolin. His lipids are followed by primary care and he remains on Crestor without side effects. Carotid Dopplers followed yearly by [name]. Kellie [name]. He returns for reevaluation UPDATE 08/15/2017CM [name]. [name], 84 year old returns for follow-up with his wife. He has a history of stroke in January 2015. He has not had further stroke or TIA symptoms since that time he is currently on Plavix with minimal bruising. His memory is stable, MMSE 27/30. He continues to play golf twice a week and participate in other activities to stimulate his mind. He remains on Cerefolin. He was recently diagnosed with pulmonary fibrosis and is using oxygen at night. He remains on Crestor and lipids are followed by primary care [name]. Laurann [name].  Carotid Doppler is followed by [facility]. Doppler is due to February 2018. He returns for reevaluation Update 2/13/2018PS : He returns for follow-up after last visit 6 months ago. He is accompanied by his wife. He states his doing well. He has had no recurrent stroke or TIA symptoms now for 2 years. He is on Plavix but is having significant skin bruising. He in fact is currently having 2 areas that he has Band-Aids because of the bruising. Patient does have regular follow up with vascular surgeon for his aortic aneurysm as well as carotid surgery surveillance. He states her blood pressure is under good control and today it is 130/83. He recently had lipid profile checked on January 2 with LDL cholesterol was 63, HDL 77, triglycerides 116 and total cholesterol 163 mg percent. He has been diagnosed with pulmonary fibrosis and started on home oxygen at night only. He states he feels and in the day. Vistaril memory difficulties about the same. He remains on Cerefolin NAC and Reservatrol. He is mostly independent in history driving. He has short-term memory difficulties mostly. He has no other new complaints Update 12/01/2016 CM: He  returns for follow-up after last of that 6 months ago. He is accompanied by his wife. They feel his memory difficulties about unchanged. She still has mild short-term memory difficulties. He does keep himself busy in mentally challenging activities like doing sudoku and playing computer games. He is on home oxygen with pulmonary fibrosis which limits his physical activity. He has noticed diminished bruising after changing from Plavix to aspirin. He is had no recurrent stroke or TIA symptoms. He has no new complaints today. The patient has his wife has decided not to participate and early dementia studies at the present time hence they do not have the time UPDATE 3/12/2019CM [name]. Goyer, 84 year old male returns for follow-up with history of mild cognitive impairment and stroke event in 2015.  He is supposed to be on aspirin for secondary stroke prevention however he has not been taking this.  He has not had further stroke or TIA symptoms he had significant bruising on Plavix.  He remains on Crestor without myalgias.  Blood pressure in the office today 120/65.  His mild cognitive impairment is stable.  He also has a history of pulmonary fibrosis which limits his physical activity.  He is on oxygen therapy.  He tries to remain  physically active but tires easily.  He still does computer games etc.  He returns for reevaluation UPDATE 9/18/2019CM [name]. Keltner, 84 year old male returns for follow-up with a history of stroke event in 2015 and mild cognitive impairment.  He remains on aspirin for secondary stroke prevention without recurrent stroke or TIA symptoms he has minimal bruising and no bleeding.  Blood pressure in the office today 116 at 72.  He remains on Crestor without myalgias recent LDL 62 according to the wife.  He has a history of pulmonary fibrosis and uses oxygen as needed.  He says he still plays golf at least once a week.  He tries to stay as active as possible.  His mild cognitive impairment is stable.  He  continues to do exercises to stimulate his memory.  He returns for reevaluation REVIEW OF SYSTEMS: Full 14 system review of systems performed and notable only for those listed, all others are neg:  Constitutional: neg  Cardiovascular: neg Ear/Nose/Throat: neg  Skin: neg Eyes: neg Respiratory: Shortness of breath on oxygen Gastroitestinal: neg  Hematology/Lymphatic: neg  Endocrine: neg Musculoskeletal:neg Allergy/Immunology: neg Neurological: Mild cognitive impairment, history of stroke Psychiatric: neg Sleep : neg   ALLERGIES: Allergies  Allergen Reactions  . Sudafed [Pseudoephedrine Hcl]     Causes dizziness and nervousness  . Atorvastatin Other (See Comments)    Reaction: Patient's family reports "confusion" and would prefer crestor     HOME MEDICATIONS: Outpatient Medications Prior to Visit  Medication Sig Dispense Refill  . acetaminophen (TYLENOL) 500 MG tablet Take 500 mg by mouth every 6 (six) hours as needed for mild pain.    Marland Kitchen aspirin 81 MG tablet Take 81 mg by mouth daily.    Marland Kitchen azelastine (ASTELIN) 0.1 % nasal spray Place 2 sprays into both nostrils 2 (two) times daily.   4  . cetirizine (ZYRTEC) 10 MG tablet Take 10 mg by mouth daily as needed for allergies.     Marland Kitchen dextromethorphan-guaiFENesin (MUCINEX DM) 30-600 MG 12hr tablet Take 1 tablet by mouth 2 (two) times daily.    Marland Kitchen L-Methylfolate-B12-B6-B2 (CEREFOLIN) 08-20-48-5 MG TABS Take 1 tablet by mouth daily. 90 each 3  . Multiple Vitamins-Minerals (CENTRUM SILVER PO) Take 1 tablet by mouth daily.     . naproxen sodium (ANAPROX) 220 MG tablet Take 220 mg by mouth daily as needed (pain).    . Omega-3 Fatty Acids (FISH OIL PO) Take 1 capsule by mouth daily.    . pantoprazole (PROTONIX) 40 MG tablet TAKE 1 TABLET DAILY,30 MINUTES BEFORE DINNER. 30 tablet 6  . predniSONE (DELTASONE) 10 MG tablet TAKE 1 TABLET EVERY DAY WITH BREAKFAST. 30 tablet 3  . Resveratrol 100 MG CAPS Take 100 mg by mouth every evening.     .  rosuvastatin (CRESTOR) 5 MG tablet Take 5 mg by mouth at bedtime.     Facility-Administered Medications Prior to Visit  Medication Dose Route Frequency Provider Last Rate Last Dose  . aspirin EC tablet 81 mg  81 mg Oral Daily Garvin Fila, MD        PAST MEDICAL HISTORY: Past Medical History:  Diagnosis Date  . AAA (abdominal aortic aneurysm) ([place])   . Allergy   . Carotid artery stenosis   . Diplopia   . GERD (gastroesophageal reflux disease)   . Hyperlipidemia   . Memory loss   . Prostate cancer ([place])   . Pulmonary fibrosis ([place])   . Spondylisthesis 2015   Spine  . Stroke [facility]) 04/18/13  some weakness left-returning strength  . TIA (transient ischemic attack)     PAST SURGICAL HISTORY: Past Surgical History:  Procedure Laterality Date  . ABDOMINAL AORTIC ENDOVASCULAR STENT GRAFT N/A 06/17/2016   Procedure: ABDOMINAL AORTIC ENDOVASCULAR STENT GRAFT;  Surgeon: Waynetta Sandy, MD;  Location[facility];  Service: Vascular;  Laterality: N/A;  . CAROTID ENDARTERECTOMY  Sept. 2008   Elective left carotid endarterectomy  . CAROTID ENDARTERECTOMY Right 04/27/2013   CE  . ENDARTERECTOMY Right 04/27/2013   Procedure: ENDARTERECTOMY CAROTID;  Surgeon: Mal Misty, MD;  Location: [facility];  Service: Vascular;  Laterality: Right;  . EYE SURGERY     Bilateral cataract   . FOOT SURGERY      left Achilles Tendon Repair  . seed implantation  2001   for prostate ca    FAMILY HISTORY: Family History  Problem Relation Age of Onset  . Stroke Mother   . Heart disease Mother        After age 8  . Varicose Veins Mother   . Hypertension Mother   . Cancer Mother        Theadora Rama   . Stroke Father   . Heart attack Father   . Deep vein thrombosis Father   . Anuerysm Father   . Heart disease Brother     SOCIAL HISTORY: Social History   Socioeconomic History  . Marital status: Married    Spouse name: lee  . Number of children: 2  . Years of education: college  . Highest  education level: Not on file  Occupational History  . Occupation: retired    Fish farm manager: RETIRED    Comment: [employer]  . Financial resource strain: Not on file  . Food insecurity:    Worry: Not on file    Inability: Not on file  . Transportation needs:    Medical: Not on file    Non-medical: Not on file  Tobacco Use  . Smoking status: Former Smoker    Packs/day: 1.00    Years: 14.00    Pack years: 14.00    Types: Cigarettes    Last attempt to quit: 03/27/1965    Years since quitting: 52.7  . Smokeless tobacco: Never Used  . Tobacco comment: occ alcohol  Substance and Sexual Activity  . Alcohol use: Yes    Alcohol/week: 1.0 standard drinks    Types: 1 Shots of liquor per week    Comment: 3-4 drinks week bourbon  . Drug use: No  . Sexual activity: Not Currently  Lifestyle  . Physical activity:    Days per week: Not on file    Minutes per session: Not on file  . Stress: Not on file  Relationships  . Social connections:    Talks on phone: Not on file    Gets together: Not on file    Attends religious service: Not on file    Active member of club or organization: Not on file    Attends meetings of clubs or organizations: Not on file    Relationship status: Not on file  . Intimate partner violence:    Fear of current or ex partner: Not on file    Emotionally abused: Not on file    Physically abused: Not on file    Forced sexual activity: Not on file  Other Topics Concern  . Not on file  Social History Narrative  . Not on file     PHYSICAL EXAM  Vitals:   12/07/17 1008  BP: 116/72  Pulse: 87  Weight: 178 lb 3.2 oz (80.8 kg)  Height: 5\' 10"  (1.778 m)   Body mass index is 25.57 kg/m.  Generalized: Well developed, in no acute distress  Head: normocephalic and atraumatic,. Oropharynx benign  Neck: Supple, no carotid bruits  Cardiac: Regular rate rhythm, no murmur  Musculoskeletal: No deformity   Neurological examination   Mentation:  Alert  Clock drawing 4/4 MMSE - Mini Mental State Exam 05/31/2017 12/01/2016 05/04/2016  Orientation to time 4 4 5   Orientation to Place 4 5 4   Registration 3 3 3   Attention/ Calculation 5 5 5   Recall 1 1 3   Language- name 2 objects 2 2 2   Language- repeat 1 1 1   Language- follow 3 step command 3 3 3   Language- read & follow direction 1 1 1   Write a sentence 1 1 1   Copy design 1 1 1   Total score 26 27 29   Follows all commands speech and language fluent.   Cranial nerve II-XII: Pupils were equal round reactive to light extraocular movements were full, visual field were full on confrontational test. Facial sensation and strength were normal. hearing was intact to finger rubbing bilaterally. Uvula tongue midline. head turning and shoulder shrug were normal and symmetric.Tongue protrusion into cheek strength was normal. Motor: normal bulk and tone, full strength in the BUE, BLE, Sensory: normal and symmetric to light touch,  in the upper and lower extremities Coordination: finger-nose-finger, heel-to-shin bilaterally, no dysmetria Reflexes: 1+ upper lower and symmetric plantar responses were flexor bilaterally. Gait and Station: Rising up from seated position without assistance, normal stance,  moderate stride, good arm swing, smooth turning, able to perform tiptoe, and heel walking without difficulty. Tandem gait is steady.  No assistive device  DIAGNOSTIC DATA (LABS, IMAGING, TESTING) - I reviewed patient records, labs, notes, testing and imaging myself where available.  Lab Results  Component Value Date   WBC 12.0 (H) 07/20/2016   HGB 12.8 (L) 07/20/2016   HCT 38.6 (L) 07/20/2016   MCV 93.2 07/20/2016   PLT 213.0 07/20/2016      Component Value Date/Time   NA 139 07/20/2016 1301   K 3.6 07/20/2016 1301   CL 102 07/20/2016 1301   CO2 28 07/20/2016 1301   GLUCOSE 97 07/20/2016 1301   BUN 14 07/20/2016 1301   CREATININE 1.00 07/20/2016 1301   CALCIUM 9.2 07/20/2016 1301   PROT  7.7 07/20/2016 1301   ALBUMIN 3.9 07/20/2016 1301   AST 30 07/20/2016 1301   ALT 26 07/20/2016 1301   ALKPHOS 69 07/20/2016 1301   BILITOT 0.6 07/20/2016 1301   GFRNONAA >60 06/18/2016 0445   GFRAA >60 06/18/2016 0445     Lab Results  Component Value Date   TSH 3.94 07/20/2016      ASSESSMENT AND PLAN 88year with right posterior frontal infarct in January 2015 secondary to symptomatic right ICA stenosis for which he has undergone elective right carotid endarterectomy. Multiple vascular risk factors of carotid stenosis, hyperlipidemia, peripheral vascular disease and history of TIA. He also has pre-existing memory loss which is likely mild cognitive impairment which appears stable.The patient is a current patient of [name]. Leonie Man   who is out of the office today . This note is sent to the work in doctor.      PLAN: Continue aspirin for secondary stroke prevention Continue Cerefolin for mild cognitive impairment.   MMSE is stable Continue Crestor for hyperlipidemia Continue  to participate in mentally challenging activities like crossword puzzles, bridge Sudoku.  Continue to play golf as able Follow-up in 6 months I spent 25 minutes in total face to face time with the patient more than 50% of which was spent counseling and coordination of care, reviewing test results reviewing medications and discussing and reviewing the diagnosis of stroke and management of risk factors along with mild cognitive impairment and compensation strategies Dennie Bible, [facility], [facility], APRN  [facility] [address], [place], [place] [address] [phone]  I reviewed the above note and documentation by the Nurse Practitioner and agree with the history, physical exam, assessment and plan as outlined above. I was immediately available for face-to-face consultation. Star Age, MD, PhD [facility])

## 2017-12-07 ENCOUNTER — Ambulatory Visit: Payer: Medicare Other | Admitting: Nurse Practitioner

## 2017-12-07 ENCOUNTER — Encounter: Payer: Self-pay | Admitting: Nurse Practitioner

## 2017-12-07 VITALS — BP 116/72 | HR 87 | Ht 70.0 in | Wt 178.2 lb

## 2017-12-07 DIAGNOSIS — Z8673 Personal history of transient ischemic attack (TIA), and cerebral infarction without residual deficits: Secondary | ICD-10-CM

## 2017-12-07 DIAGNOSIS — G3184 Mild cognitive impairment, so stated: Secondary | ICD-10-CM | POA: Diagnosis not present

## 2017-12-07 NOTE — Patient Instructions (Signed)
Continue aspirin for secondary stroke prevention Continue Cerefolin for mild cognitive impairment.   MMSE is stable Continue Crestor for hyperlipidemia Continue to participate in mentally challenging activities like crossword puzzles, bridge Sudoku.   Continue to play golf as able Follow-up in 6 months

## 2017-12-17 DIAGNOSIS — J84112 Idiopathic pulmonary fibrosis: Secondary | ICD-10-CM | POA: Diagnosis not present

## 2017-12-17 DIAGNOSIS — I2510 Atherosclerotic heart disease of native coronary artery without angina pectoris: Secondary | ICD-10-CM | POA: Diagnosis not present

## 2018-01-06 DIAGNOSIS — Z23 Encounter for immunization: Secondary | ICD-10-CM | POA: Diagnosis not present

## 2018-01-16 DIAGNOSIS — J84112 Idiopathic pulmonary fibrosis: Secondary | ICD-10-CM | POA: Diagnosis not present

## 2018-01-16 DIAGNOSIS — I2510 Atherosclerotic heart disease of native coronary artery without angina pectoris: Secondary | ICD-10-CM | POA: Diagnosis not present

## 2018-02-16 DIAGNOSIS — J84112 Idiopathic pulmonary fibrosis: Secondary | ICD-10-CM | POA: Diagnosis not present

## 2018-02-16 DIAGNOSIS — I2510 Atherosclerotic heart disease of native coronary artery without angina pectoris: Secondary | ICD-10-CM | POA: Diagnosis not present

## 2018-02-25 DIAGNOSIS — R300 Dysuria: Secondary | ICD-10-CM | POA: Diagnosis not present

## 2018-02-28 ENCOUNTER — Encounter: Payer: Self-pay | Admitting: Pulmonary Disease

## 2018-02-28 ENCOUNTER — Ambulatory Visit: Payer: Medicare Other | Admitting: Pulmonary Disease

## 2018-02-28 VITALS — BP 122/64 | HR 71 | Temp 97.4°F | Ht 70.0 in | Wt 179.0 lb

## 2018-02-28 DIAGNOSIS — Z95828 Presence of other vascular implants and grafts: Secondary | ICD-10-CM

## 2018-02-28 DIAGNOSIS — G3184 Mild cognitive impairment, so stated: Secondary | ICD-10-CM

## 2018-02-28 DIAGNOSIS — R413 Other amnesia: Secondary | ICD-10-CM

## 2018-02-28 DIAGNOSIS — I6523 Occlusion and stenosis of bilateral carotid arteries: Secondary | ICD-10-CM

## 2018-02-28 DIAGNOSIS — I714 Abdominal aortic aneurysm, without rupture, unspecified: Secondary | ICD-10-CM

## 2018-02-28 DIAGNOSIS — J84112 Idiopathic pulmonary fibrosis: Secondary | ICD-10-CM

## 2018-02-28 DIAGNOSIS — Z8673 Personal history of transient ischemic attack (TIA), and cerebral infarction without residual deficits: Secondary | ICD-10-CM

## 2018-02-28 DIAGNOSIS — J9611 Chronic respiratory failure with hypoxia: Secondary | ICD-10-CM

## 2018-02-28 DIAGNOSIS — Z9889 Other specified postprocedural states: Secondary | ICD-10-CM

## 2018-02-28 DIAGNOSIS — I2510 Atherosclerotic heart disease of native coronary artery without angina pectoris: Secondary | ICD-10-CM

## 2018-02-28 NOTE — Patient Instructions (Addendum)
Today we updated your med list in our EPIC system...    Continue your current medications the same...  We discussed staying active, consider some daily exercise w/ your oxygen...  Call for any questions...  We will arrange for a follow up visit w/ my partner- DrMcQuade in about 82months...  Mr & Mrs Schoffstall-- it has been my honor to have been one of your doctors over the past several years...    Wishing you a very merry Christmas and a happy & healthy new year and beyond!!!

## 2018-03-01 ENCOUNTER — Telehealth: Payer: Self-pay | Admitting: Pulmonary Disease

## 2018-03-01 NOTE — Telephone Encounter (Signed)
Called and spoke with patients wife, advised that we can send in a prescription once he is out of the medication even though SN will not be here, the NP or new MD taking over the patient will send in new prescription refill since he has a OV in march. Nothing further needed.

## 2018-03-18 DIAGNOSIS — J84112 Idiopathic pulmonary fibrosis: Secondary | ICD-10-CM | POA: Diagnosis not present

## 2018-03-18 DIAGNOSIS — I2510 Atherosclerotic heart disease of native coronary artery without angina pectoris: Secondary | ICD-10-CM | POA: Diagnosis not present

## 2018-03-30 ENCOUNTER — Other Ambulatory Visit: Payer: Self-pay | Admitting: Neurology

## 2018-04-09 ENCOUNTER — Other Ambulatory Visit: Payer: Self-pay | Admitting: Pulmonary Disease

## 2018-04-18 DIAGNOSIS — I2510 Atherosclerotic heart disease of native coronary artery without angina pectoris: Secondary | ICD-10-CM | POA: Diagnosis not present

## 2018-04-18 DIAGNOSIS — J84112 Idiopathic pulmonary fibrosis: Secondary | ICD-10-CM | POA: Diagnosis not present

## 2018-04-19 DIAGNOSIS — Z85828 Personal history of other malignant neoplasm of skin: Secondary | ICD-10-CM | POA: Diagnosis not present

## 2018-04-19 DIAGNOSIS — L570 Actinic keratosis: Secondary | ICD-10-CM | POA: Diagnosis not present

## 2018-04-19 DIAGNOSIS — L820 Inflamed seborrheic keratosis: Secondary | ICD-10-CM | POA: Diagnosis not present

## 2018-04-19 DIAGNOSIS — E782 Mixed hyperlipidemia: Secondary | ICD-10-CM | POA: Diagnosis not present

## 2018-04-19 DIAGNOSIS — L918 Other hypertrophic disorders of the skin: Secondary | ICD-10-CM | POA: Diagnosis not present

## 2018-04-19 DIAGNOSIS — I6521 Occlusion and stenosis of right carotid artery: Secondary | ICD-10-CM | POA: Diagnosis not present

## 2018-04-19 DIAGNOSIS — Z0000 Encounter for general adult medical examination without abnormal findings: Secondary | ICD-10-CM | POA: Diagnosis not present

## 2018-04-19 DIAGNOSIS — I63511 Cerebral infarction due to unspecified occlusion or stenosis of right middle cerebral artery: Secondary | ICD-10-CM | POA: Diagnosis not present

## 2018-05-19 DIAGNOSIS — I2510 Atherosclerotic heart disease of native coronary artery without angina pectoris: Secondary | ICD-10-CM | POA: Diagnosis not present

## 2018-05-19 DIAGNOSIS — J84112 Idiopathic pulmonary fibrosis: Secondary | ICD-10-CM | POA: Diagnosis not present

## 2018-05-24 DIAGNOSIS — J324 Chronic pansinusitis: Secondary | ICD-10-CM | POA: Diagnosis not present

## 2018-05-29 ENCOUNTER — Encounter: Payer: Self-pay | Admitting: Pulmonary Disease

## 2018-05-29 NOTE — Progress Notes (Signed)
Subjective:     Patient ID: [name] [name], male   DOB: Jan 04, 1930, 85 y.o.   MRN: [id number]  HPI 85 y/o WF w/ mult co-morbidities followed for pulm fibrosis (IPF/UIP) and hypoxemic resp insufficiency- on Prednisone and Home O2...  ~  June 24, 2015:  Initial pulmonary consult by SN>  The Britts are friends/ church members with the [name] [name] family... 85 y/o WM referred by Dr.Elaine Laurann [name] for further evaluation & treatment of pulmonary fibrosis>  He recently had a vasc surg follow up visit where they noted crackles in the lung bases and referred him back to [name];  He c/o a slight breathing problem w/ SOB/DOE after mowing the yard for 40 min, climbing hills, playing golf but no real change for years he says (stable- not progressive);  He has noted a slight dry cough x1-32yrs and actually saw ENT for eval post nasal drip/ congestion but no better after sinusitis treatment;  Then he saw Allergist- neg testing, no allergies found;  He has occas beige/yellow sput production, denies hx bronchitis/ recurrent infections, and no f/c/s;  He was seen by VVS for f/u CDopplers (they remain patent s/p bilat CAEs) and they noted crackles at L>R lung base;  Pt rechecked by [name] w/ similar findings and she rechecked CXR & PFT (see below) => refer to pulmonary...   Smoking Hx>  He is an ex-smoker; started around age 56, smoked for ~21yrs but always <1ppd; Quit smoking 1966 w/ a 10 pack-year smoking hx...  Pulmonary Hx>  He denies any hx asthma, does not recall treatment for bronchits, never had pneumonia, no hx TB or known exposure;  He has been treated for sinus infections.  Medical Hx>  HBP, vasc dis w/ carotid dis (s/p bilat CAEs) & AAA, TIA/ stroke, HL, prostate ca (s/p seeds), DJD/ spondylosis, mild dementia  Family Hx>  There is no family hx of lung disease;  Father was a smoker & had a heart attack & stroke.  Occup Hx>  He was the Psychologist, educational for [employer]- no known exposure to  asbestos, silica dust, other inorganic or organic dusts/ materials...  Current Meds>  Astelin/ Atrovent nasal, Zyrtek, Plavix, Resveratrol, Crestor, Fish Oil, Vits... EXAM shows Afeb, VSS, O2sat=92% on RA;  HEENT- neg, mallampati2;  Chest- bibasilar velcro rales L>R ~1/3rd the way up post chest wall;  Heart- RR w/o m/r/g;  Abd- soft, nontender, neg;  Ext- neg w/o c/c/e...  CT Angio Abd done 01/02/08>  Chronic interstitial changes noted at the lung bases bilat; fusiform infrarenal AAA at 3.5cm, mod ostial dis of celiac axis origin...   Old CXR 08/15/08 showed norm heart size, patchy opac at the left lung base, otherw neg...  Old CXR 04/18/13 showed norm heart size, sl tortuous Ao, diffuse incr in interstitial markings L>R...  Recent CXR 05/21/15>  Heart at upper lim of normal, prominent coarse interstitial markings bilat L>R, no focal abn & unchanged from 2015 film...  Full PFT 05/29/15> FVC=2.83 (73%), FEV1=2.48 (91%), %1sec=87, mid-flows wnl at 155% predicted; TLC=4.97 (70%), RV=2.08 (76%), RV/TLC=42%; DLCO=23% predicted; c/w a mild restrictive ventilatory defect & severe decr DLCO.   Ambulatory Oximetry 06/24/15>  O2sat=100% on RA at rest w/ pulse=59/min;  He ambulated only 2 Laps in office w/ lowest O2sat=86% w/ pulse=91/min... He indicated that he does NOT want oxygen at this point.  LABS 06/24/15>  Chems- wnl w/ Cr=0.96;  CBC- wnl w/ Hg=13.8;  TSH=4.55;  Sed=34;  CRP=0.8;  RF>600;  CCP=<16;  ANA=neg  CXR 04/18/13                                          CXR 05/21/15     IMP >>     Interstitial Lung Disease-- likely IPF and we will proceed w/ His-res CT Chest, & Preliminary LAB screen...    CARDIAC issues>  53 y/o, HBP, vasc dis w/ bilat carotid dis (s/p bilat CAEs) & AAA (4.1cm), TIA/ stroke, HL; followed by Cherly [name] & [name]...    MEDICAL issues>  89 y/o, HBP, vasc dis w/ bilat carotid dis (s/p bilat CAEs) & AAA, TIA/ stroke, HL, prostate ca (s/p seeds), DJD/ spondylosis,  mild dementia; followed by [name] & [name]... PLAN >>     We discussed pulmonary fibrosis and the evaluation- they are familiar w/ this dis since a church friend was recently diagnosed and has since passed away; they are understandably concerned and I pointed out some differences in [name] (ie- he has had this on his CXR w/o much change since 2015);  We will proceed w/ Labs and Hi-res CT Chest => pending.  ADDENDUM>> Hi-res CT CHEST done 07/02/15>  Norm heart size, atherosclerosis of Ao/ coronaries including [place], no adenopathy or lung nodules; extensive subpleural reticulation, traction bronchiectasis, & architectural distortion bilaterally w/ basilar predominance, moderate honeycombing => all favors UIP diagnosis... I called pt & wife w/ report & they will think about options and f/u OV 07/11/15 to discuss further...   ~  July 11, 2015:  2wk ROV w/ SN>  As above I have prev discussed his results to date w/ pt & wife & layed out his options for treating his UIP/ IPF => specifically I outlined a 3-prong approach w/ Aggressive/ Middle-of-the-road/ Conservative options;  They have considered each of these approaches and discussed w/ their children => they have chosen the 2nd option for a middle-of-the-road approach, and in this regard they feel that additional collagen-vasc blood work would not add substantially to his treatment options, and they do not wish to pursue antifibrotic therapy w/ Ofev or Esbriet;  We decided to start pt on a low dose of Pred 10mg /d and he is cautioned to elim salt from their diet, & watch caloric intake;  We plan ROV in 6-8 weeks w/ CXR & Labs at that time...   Ambulatory Oximetry 07/11/15>  O2sat=95% on RA at rest w/ pulse=74/min;  He ambulated 3 Laps (185'ea) w/ lowest O2sat=81% w/ pulse=103/min;  He qualifies for Home Oxygen (rec 2L/min w/ exercise) but he declines...  ONO => pending => this was NOT completed. IMP/PLAN>>  Bill needs to start on HomeO2-POC for  oxygen therapy w/ activities but he is reluctant to do so & declines my recommendation to start O2 now; he notes that he is asymptomatic- plays golf 2d/wk, mows yard, walks, etc and denies SOB, wife is not so sure (he is very stoic and has "mild cognitive impairment"); we decided to check the ONO & then proceed w/ Home O2 as indicated...  ~  August 27, 2015:  6wk ROV w/ SN>  Rush Landmark returns or f/u of his IPF/ probUIP after starting o Pred10 last visit, and reports a good interval, as noted he is stoic & has mild cognitive impairment; he is currently taking PREDNISONE 10mg /d & tol well, has Home O2 at 2L/min (doesn't like it & compliance is fair); he continues to golf &  do yard work- he does not use the port O2 despite desat w/ exercise- he just takes his time & uses rest stops...    EXAM shows Afeb, VSS, O2sat=92% on RA;  HEENT- neg, mallampati2;  Chest- bibasilar velcro rales L>R ~1/3rd the way up post chest wall;  Heart- RR w/o m/r/g;  Abd- soft, nontender, neg;  Ext- neg w/o c/c/e...  CXR 08/27/15 (independently reviewed by me in the PACS system) showed norm heart size, tortuous Ao, stable bilat fibrotic changes, NAD...  LABS 08/27/15>  Chems- ok x HCO3=33;  CBC- ok x wbc=12.1K... IMP/PLAN>>  Rush Landmark is stable on the [place] well; we wrote for a handicap placard; discussed asking [employer] company for a POC to encourage using oxygen w/ activities... we plan ROV in 82mo.  ~  December 03, 2015:  35mo ROV & Bill notes that his breathing is doing satis, no real change in DOE etc but he has been coughing more, small amt yellow sput, no hemoptysis, no f/c/s, etc;  In the interval he had a UTI- seen in ER w/ fever & some confusion & treated w/ Keflex;  He saw [name] Harriett Sine for f/u- Hx stroke on Plavix, mild cognitive impairment on Cerefolin, felt to be stable;  He also had allergy f/u DrWhelan- on Zyrtek & Astelin...    EXAM is unchanged- Afeb, VSS, O2sat=93% on RA;  HEENT- neg, mallampati2;  Chest- bibasilar velcro  rales L>R ~1/3rd the way up;  Heart- RR w/o m/r/g;  Abd- soft, nontender, neg;  Ext- neg w/o c/c/e... IMP/PLAN>>  Given 2017 Flu vaccine today, rec to add Delsym- 2tsp Bid for cough...  Pulm fibrosis- likely UIP, Exercise induced hypoxemia> continue Pred10, O2 at 2L/min Qhs & w/ exercise, low sodium low carb diet...  Cardiac issues- 85 y/o, HBP, ASPVD w/ bilat-CAEs (Left 2008, Right 2015), 4cm AAA, TIA/stroke> followed by Cherly [name] & [name].  Medical issues- as above + HL, prostate ca (s/p seeds), DJD/spondylosis, MCI/dementia> followed by [name] & [name].  ~  January 22, 2016:  6wk ROV & add-on appt requested for worsening cough>  Rush Landmark presents c/o 3 wk hx cough- "a deep intense cough", intermittently prod of a small amt of yellow sput, some incr SOB noted due to the coughing paroxysms but he denies CP or f/c/s; of note the cough seems worse at night (but not every night);  He has been taking O2 at 2L/min Qhs & prn w/ exercise during the day, Pred10/d, GFN-400 2Bid w/ fluids, Delsym prn;  The episode sounds like an IPF exacerbation & we discussed Rx w/ Depo20, incr Pred to 20mg /d for 2wks then back to 10mg /d regular dose, LEVAQUIN 500mg x7d, Align daily & Hydromet cough syrup prn;  We also discussed an antireflux regimen for the nocturnal cough- Protonix40 before dinner, NPO after dinner in the eve, elev HOB 6"...    EXAM is unchanged- Afeb, VSS, O2sat=91% on RA;  HEENT- neg, mallampati2;  Chest- bibasilar velcro rales L>R ~1/3rd the way up (of note: pt did not cough once during the OV;  Heart- RR w/o m/r/g;  Abd- soft, nontender, neg;  Ext- neg w/o c/c/e... IMP/PLAN>>  This episode may have been triggered by an upper resp infection, nocturnal reflux and LPR, or an IPF exac-- rec treatment as above w/ O2, Depo/Pred bump, Levaquin, Mucinex, Hydromet, plus we initiated a vigorous antireflux protocol w/ PPI before dinner, NPO after dinner, elev HOB 6"... He has a regular sched OV in mid-Jan & we will  recheck  pt at that time...  ~  April 20, 2016:  40mo ROV & pulmonary follow up visit> pt states that he improved after the last visit w/ IPF exac, treated w/ Levaquin & Pred bump, & pt feels that he is back to his baseline; wife is not so sure w/ incr SOB while taking out the trash & simple chores (esp if he tries it w/o his oxygen);  He denies CP, palpit, and feels that his SOB is about the same w/ exertion; mild dry cough esp at night, no sput, no hemoptysis...    Pulm fibrosis- likely UIP, Hypoxemic respiratory failure> on O2 2L/min w/ exercise & Qhs, but encouraged to use it 24/7, and PRED 10mg  Qam; he has Zyrtek & Astelin for sinuses...     Cardiac issues- 85 y/o, HBP, ASPVD w/ bilat-CAEs (Left 2008, Right 2015), 4cm AAA, TIA/stroke> followed by Cherly [name] & [name].    Medical issues- as above + HL, prostate ca (s/p seeds), DJD/spondylosis, MCI/dementia> followed by [name] & [name].    EXAM shows Afeb, VSS, O2sat=94% on 3L pulse dose POC;  HEENT- neg, mallampati2;  Chest- bibasilar velcro rales L>R ~1/3rd the way up (of note: pt did not cough once during the OV);  Heart- RR w/o m/r/g;  Abd- soft, nontender, neg;  Ext- neg w/o c/c/e; Neuro- intact x mild memory impairment... IMP/PLAN>>  Rush Landmark is +- stable and says he's doing OK; wife notes incr SOB w/ activities while not using the O2 eg- taking out the trash etc; he has a set routine & he seems to do ok; still drives some & she is asking for guidelines so we discussed not driving if S2GBT DV<76%; he recovered satis from the prev acute exac (using Levaquin & bump Pred) but new baseline is sl lower; Rec to continue the O2 w/ all activities, & the Pred 10mg /d; he will try to gradually incr exercise & expand his lungs; continue vigorous antireflux regimen which we reviewed today... we plan ROV in 14mo, sooner if needed prn.  ~  Jul 20, 2016:  21mo ROV & Bill was HOSP by [facility]-DrCain 3/29 - 06/18/16 for a 5.9cm AAA w/ Aorto bi-iliac endovasc stent graft; he  did well & has recovered from the surg; they increased his O2 to 3L/min after the surg... We reviewed the following medical problems during today's office visit >>     Pulm fibrosis- likely UIP, Hypoxemic respiratory failure> on O2 3L/min w/ exercise & Qhs, but encouraged to use it 24/7, and PRED 10mg  Qam; he has Zyrtek & Astelin for sinuses...     Cardiac issues- 85 y/o, HBP, Cardiomyopathy w/ EF=45-50% & diffuse HK + Gr2DD (2DEcho 1/15), ASPVD w/ bilat-CAEs (Left 2008, Right 2015), 4cm AAA, TIA/stroke> followed by Youlanda Mighty; on ASA81/d...    Medical issues- as above + HL (on Cres5), prostate ca (s/p seeds), DJD/spondylosis, MCI/dementia> followed by [name] & [name]=> seen 05/04/16- hx stroke & MCI, signif bruising therefore stopped Plavix, cont ASA; continue Cerefolin & Resveratrol...    EXAM shows Afeb, VSS, O2sat=100% on 3L pulse dose POC;  HEENT- neg, mallampati2;  Chest- bibasilar velcro rales L>R ~1/3rd the way up (of note: pt did not cough once during the OV);  Heart- RR w/o m/r/g;  Abd- soft, nontender, neg;  Ext- neg w/o c/c/e; Neuro- intact x mild memory impairment...  Ambulatory oximetry on 3L/min [place]>  O2sat=93% on 3L/min oxygen at rest w/ pulse=63/min;  He ambulated 2 Laps (185'ea) & O2 nadir at peak exercise  was 73% w/ pulse=105/min... Rec to incr O2 to 4-5L transiently after exercise, then back to his baseline 3L/min...  LABS 07/20/16>  Chems- wnl;  CBC- ok w/ Hg=12.8, WBC=12K;  TSH=3.94;   IMP/PLAN>>  Rush Landmark has gone thru major surg w/ Ao/Bi-iliac stent graft;  His IPF has deteriorated & I wonder if her had an exacerbation during this time? Now on 3L/min & we have maintained Pred 10mg /d...  ~  October 21, 2016:  68mo ROV & pulm follow up> Rush Landmark doesn't feel that the 3/18 surg caused any set-back but wife thinks that he is sl worse;  Pt states that O2 sats are "good" in the 90's on 3L/min pulse-dose; he denies SOB! But has difficulty taking out the trash cans, says he golfs some w/ O2  in the cart & uses it prn;  He notes sl cough, min beige sput, no hemoptysis, no f/c/s, no CP, etc...     He saw VascSurg- DrCain 07/23/16>  Hx bilat CAE's and endovasc repair of AAA (3/18- Aorto Bi-iliac endovascular stent graft);  CT Angio Abd & Pelvis 07/20/16 reviewed- patent aortic stent graft, small endoleak visualized & they are following, Scan also shows severe anterolisthesis at L5-S1, extensive diverticulosis, interstitial fibrosis at lung bases...    Pulm fibrosis- likely UIP, Hypoxemic respiratory failure> on O2 3L/min w/ exercise & Qhs, but encouraged to use it 24/7, and PRED 10mg  Qam; he has Zyrtek & Astelin for sinuses...     Cardiac issues- 85 y/o, HBP, Cardiomyopathy w/ EF=45-50% & diffuse HK + Gr2DD (2DEcho 1/15), ASPVD w/ bilat-CAEs (Left 2008, Right 2015), 4cm AAA, TIA/stroke> followed by Youlanda Mighty; on ASA81/d...    Medical issues- as above + HL (on Cres5), prostate ca (s/p seeds), DJD/spondylosis, MCI/dementia> followed by [name] & [name]=> seen 05/04/16- hx stroke & MCI, signif bruising therefore stopped Plavix, cont ASA; continue Cerefolin & Resveratrol...    EXAM shows Afeb, VSS, O2sat=94% on 3L pulse dose POC;  HEENT- neg, mallampati2;  Chest- bibasilar velcro rales L>R ~1/2 the way up (of note: pt did not cough once during the OV);  Heart- RR w/o m/r/g;  Abd- soft, nontender, neg;  Ext- neg w/o c/c/e; Neuro- intact x mild memory impairment...  CXR 10/21/16 (independently reviewed by me in the PACS system) showed stable cardiomeg, diffuse bilat interstitial prom & elev left hemidiaph, Tspine scoliosis, no acute changes...  IMP/PLAN>>  Rush Landmark is stable w/ his severe disease- pulm, cardiovasc, etc;  Continue same meds & we plan routine recheck 4mo...  ~  February 22, 2017:  42mo ROV & pt reports doing about the same- he remains on 3L/min O2, mild cough w/ clear phlegm, & he thinks breathing is better but wife says no; his CC is DOE while carrying a load, denies CP & he indicates  that [place] said he was in great shape, [facility]-DrCain noted small leak (low risk), [name] rec to use Luminosity, PCP-Dr. Kelton Pillar did labs & they were ok... We reviewed the following interval Epic notes>      He saw NEURO- [name] 12/01/16>  Mild cognitive impairment, hx stroke 03/2013 (CT/MRI showed subacute lacunar infarct in R-internal capsule region, 2DEcho w/ 45-50% EF w/ diffuse HK, & CDoppler w/ 80-99% R-ICA stenosis=> R-CAE performed by [facility]);  Short term memory deficits felt to be stable, on ASA, cerefolin & reservatrol + mentally challenging exercises, and memory compensation strategies...    He saw [facility]- DrNishan 12/16/16>  Hx CAD & ASPVD- s/p bilat CAE & EVAR of  AAA, nonischemic myoview 05/2013 w/ EF=56%; he was felt to be stable, no change in meds, f/u planned 74yr...    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     He says that he is due for a shot in his SI joint for LBP... We reviewed the following medical problems during today's office visit>      Pulm fibrosis- likely UIP w/ Hypoxemic respiratory failure> on O2 3L/min w/ exercise & Qhs, but encouraged to use it 24/7, and PRED 10mg  Qam; he has Zyrtek & Astelin for sinuses...     Cardiac issues- 85 y/o, HBP, Cardiomyopathy w/ EF=45-50% & diffuse HK + Gr2DD (2DEcho 1/15), ASPVD w/ bilat-CAEs (Left 2008, Right 2015), TIA/stroke, 5.9cm AAA- s/p endovasc surg 3/18> followed by Cherly [name] & [name]/ Donzetta Matters; on ASA81/d and monitored carefully w/ seriasl CT scan due to endoleak...Marland KitchenMarland KitchenMarland Kitchen    Medical issues- as above + HL (on Cres5), prostate ca (s/p seeds), DJD/spondylosis, MCI/dementia> followed by [name] & [name]=> seen regularly- hx stroke & MCI, signif bruising therefore stopped Plavix, cont ASA; continue Cerefolin & Resveratrol...    EXAM shows Afeb, VSS, O2sat=94% on 3L  pulse dose POC;  HEENT- neg, mallampati2;  Chest- bibasilar velcro rales L>R ~1/2 the way up (of note: pt did not cough once during the OV);  Heart- RR w/o m/r/g;  Abd- soft, nontender, neg;  Ext- neg w/o c/c/e; Neuro- intact x mild memory impairment...  CT Angio of Abd/Pelvis 02/03/17>  IMPRESSION:  VASCULAR-- 1. Stable aneurysm sac size of 5.1 x 5.9 cm. Mild type 2 endoleak remain suspected based on increased density within the inferior right aspect of the aneurysm sac on delayed imaging. Outflow remain suspected via the accessory lower pole right renal artery based on delayed perfusion of the right lower kidney. Inflow is more difficult to assess on the current study due to significant respiratory motion on the arterial phase scan. Lumbar inflow remains the most likely source of type 2 endoleak.  2. Mild aneurysmal disease of the native proximal aorta remains stable measuring 3.3 cm in greatest diameter just below the SMA origin.   NON-VASCULAR-- Stable fibrotic lung disease at the lung bases and severe degenerative disc disease at L5-S1 with associated anterolisthesis. IMP/PLAN>>  Rush Landmark is overall stable w/ his severe atherosclerotic vasc dis, mild cognitive impairment/ vasc dementia, and pulm fibrosis on O3 at [place];  We decided to continue same, gave him a Rx for  ZPak prn use this winter, & rec to stay active, call for any problems...   ~  June 23, 2017:  34mo ROV & pulmonary follow up visit>  Rush Landmark indicates that his breathing is OK, notes SOB w/ most activities- carrying objects, taking out the trash, etc; he has played 1 round of golf so far this yr; denies CP/ palpit/ dizzy/ edema; notes some cough, yellowish phlegm, but no blood, not tight, no wheezing, no f/c/s, GI is ok; he is still too sedentary & asked to incr PT at home, more exercise... His saw his PCP- DrEGriffin ~88mo ago w/ labs done & reported ok (they are again asked to get copies sent to [place]); he is up to date on pneumonia & Flu  vaccines, he did not need the ZPak this winter...  We reviewed interval notes in Epic>  He saw NEURO-CMatin,NP on 05/31/17>  F/u stroke 2015 & MCI; supposed to be on ASA81 daily but wasn't taking this=> told to restart; denies cerebral ischemic symtoms; he is minimally active, but does computer games & was prev rec to try Luminosity;  MMSE slowly dropped- now at 26/30; they have not rec to start Aricept etc... We reviewed the following medical problems during today's office visit>      Pulm fibrosis- likely UIP w/ Hypoxemic respiratory failure> on O2- 3L/min w/ exercise & Qhs, but encouraged to use it 24/7, and PRED 10mg  Qam; he has Zyrtek & Astelin for sinuses...     Cardiac issues- 85 y/o, HBP, Cardiomyopathy w/ EF=45-50% & diffuse HK + Gr2DD (2DEcho 1/15), ASPVD w/ bilat-CAEs (Left 2008, Right 2015), TIA/stroke, 5.9cm AAA- s/p endovasc surg 3/18> followed by Cherly [name] & [name]/ Donzetta Matters; on ASA81/d and monitored carefully w/ serial CT scans due to endoleak...Marland KitchenMarland KitchenMarland Kitchen    Medical issues- as above + HL (on Cres5), prostate ca (s/p seeds), DJD/spondylosis, MCI/dementia> followed by [name] & [name]=> seen regularly- hx stroke & MCI, signif bruising therefore stopped Plavix, cont ASA; continue Cerefolin & Resveratrol...    EXAM shows Afeb, VSS, O2sat=96% on 3L pulse dose POC;  HEENT- neg, mallampati2;  Chest- bibasilar velcro rales L>R ~1/2 the way up (of note: pt did not cough during the OV);  Heart- RR w/o m/r/g;  Abd- soft, nontender, neg;  Ext- neg w/o c/c/e; Neuro- intact x mild memory impairment... IMP/PLAN>>  Rush Landmark is stable overall, he has been taking his ASA81 & we will add this to his med list; LABS were done by his PCP & they will get copies for [place] to scan into Epic; Breathing appears to be about at baseline- he has signif pulm fibrosis, O2 dependent, & is too sedentary- again asked to incr his activ level; REC to continue same meds & call for any questions...    ~  October 26, 2017:  53mo ROV & Rush Landmark  indicates that he is stable- on his regimen of Pred10Qam, Mucinex600Bid, Zyrtek10 & Astelin nasal spray prn; he says his breathing is stable, min cough, occ light yellow sput, no hemoptysis, not SOB at rest or w/ ADLs- just w/ walking & that is when he uses his portO2- 3L/min w/ ambulation AND Qhs too... He denies CP, palpit, dizzy, edema; he remains sedentary despite all efforts to get him up & exercising... We reviewed interval notes in Epic>     He saw VascSurg- SNickel,NP on 09/27/17> s/p aorto bi-iliac endovasc stent graft 05/2017 by DrCain for a 6cm AAA; he has a stable type II endoleak size to 5.9 cm; he denies abd or back pain; he also has hx stoke in 2008 and a L-CAE was performed, he had another TIA in 03/2013 & had a R-CAE; he is followed by [name] w/ mild dementia;  Duplex exam showed patent bilat CAE sites & no change from prev exams;  The AAA exam was stable- AAA sac 6.0cm, R-CIA 1.5cm, L-CIA 1.3cm, extra-stent flow in the resid AAA sac consistent w/ type 2 endoleak; Cr=1.03; they plan f/u 56yr... 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     Cardiac issues- 85 y/o (birthday today), HBP, Cardiomyopathy w/ EF=45-50% & diffuse HK + Gr2DD (2DEcho  1/15), ASPVD w/ bilat-CAEs (Left 2008, Right 2015), TIA/stroke, 6.0cm AAA- s/p endovasc surg 05/2016> followed by Cherly [name] & [name]/ Donzetta Matters; on ASA81/d and monitored carefully w/ serial CT scans due to endoleak...Marland KitchenMarland KitchenMarland Kitchen    Medical issues- as above + HL (on Cres5), prostate ca (s/p seeds), DJD/spondylosis, MCI/dementia> followed by [name] & [name]=> seen regularly- hx stroke & MCI, signif bruising therefore stopped Plavix, cont ASA; continue Cerefolin & Resveratrol...    EXAM shows Afeb, VSS, O2sat=92% on RA- he has pulse dose POC for  ambulation;  HEENT- neg, mallampati2;  Chest- bibasilar velcro rales L>R ~1/2 the way up (of note: pt did not cough during the OV);  Heart- RR w/o m/r/g;  Abd- soft, nontender, neg;  Ext- neg w/o c/c/e; Neuro- intact x mild memory impairment...  CXR 10/26/17 (independently reviewed by me in the PACS system) shows stable cardiomegaly, bilat diffuse pulm fibrosis w/o focal opacity/ effusion/ etc- stable compared to prev XRays... IMP/PLAN>>  Rush Landmark is stable- pulm fibrosis, ischemic cardiomyopathy, extensive atherosclerotic vasc dis, & medical issues w/ HBP/ HL/ prostate cancer/ DJD & spondylosis/ hx stoke & dementia; we have maintined him on Pred10, O2 encouraged 24/7 and he is clinically & roentgenographically stable- continue same Rx & f/u in 4 months for recheck in advance of my retirement...   ~  February 28, 2018:  16mo ROV & Rush Landmark returns reporting a good interval- notes chr stable DOE w/ walking (using his O2 regularly (but wife notes that he won't use it when going to his [employer]);  He describes min cough, tan mucus, no hemoptysis, no f/c/s, no CP/ palpit/ edema;  He remains on Pred 10mg /d (plus as needed zyrtek/ astelin/ mucinex) and he still needs to cooperate w/ a regular exercise program...    His PCP is Dr. Kelton Pillar and they report last check up was 01/2018 (notes not avail in Epic)... he is up to date on vaccinations etc...    He is followed by VascSurg>  Seen 09/2017 (see 10/26/17 OV note)- s/p aorto bi-iliac endovasc stent graft 05/2017 by DrCain for a 6cm AAA...    He also saw NEURO- NMartin,NP on 12/07/17>  MCI, hx stroke 2008 w/ L-CAE and hx stroke 2015- subacute lacunar infarct in internal capsule on scans;  CDoppler w/ 80-99% right ICA stenosis & he had a right CAE by [name];  He remains on ASA81 and was felt to be stable- no changes made...  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     Cardiac issues- 85 y/o, HBP, Cardiomyopathy w/ EF=45-50% & diffuse HK + Gr2DD (2DEcho 1/15), ASPVD w/ bilat-CAEs (Left 2008, Right 2015), TIA/stroke, 6.0cm AAA- s/p endovasc surg 05/2016> followed by Cherly [name] & [name]/ Donzetta Matters; on ASA81/d and monitored carefully w/ serial CT scans due to endoleak...Marland KitchenMarland KitchenMarland Kitchen    Medical issues- as above + HL (on Cres5), prostate ca (s/p seeds), DJD/spondylosis, MCI/dementia> followed by [name] & [name]=> seen regularly- hx stroke & MCI, signif bruising therefore stopped Plavix, cont ASA; continue Cerefolin & Resveratrol...    EXAM shows Afeb, VSS, O2sat=92=8% on 2L/min [place]- he has a pulse dose POC for ambulation;  HEENT- neg, mallampati2;  Chest- bibasilar velcro rales L>R ~1/2 the way up (of note: pt did not cough during the OV);  Heart- RR w/o m/r/g;  Abd- soft, nontender, neg;  Ext- neg w/o c/c/e; Neuro- intact x mild memory impairment... IMP/PLAN>>  Bill remains stable- again asked to incr exercise & establish a good regimen;  We discussed my up coming retirement & we will arrange for a pulm follow up visit w/ DrMcQuade in 3-49months...                                        Past Medical History:  Diagnosis Date  . AAA (abdominal aortic aneurysm) ([place])   . Allergy   . Carotid artery stenosis   . Diplopia   . GERD (gastroesophageal reflux disease)   . Hyperlipidemia   . Memory loss   . Prostate cancer ([place])   . Pulmonary fibrosis ([place])   . Spondylisthesis 2015   Spine  . Stroke ([place]) 04/18/13   some weakness left-returning strength  . TIA (transient ischemic attack)     Past Surgical History:  Procedure Laterality Date  . ABDOMINAL AORTIC ENDOVASCULAR STENT GRAFT N/A 06/17/2016   Procedure:  ABDOMINAL AORTIC ENDOVASCULAR STENT GRAFT;  Surgeon: Waynetta Sandy, MD;  Location: [facility] OR;  Service: Vascular;  Laterality: N/A;  . CAROTID ENDARTERECTOMY  Sept. 2008   Elective left carotid endarterectomy  . CAROTID ENDARTERECTOMY Right 04/27/2013   CE  . ENDARTERECTOMY Right 04/27/2013   Procedure: ENDARTERECTOMY CAROTID;  Surgeon: Mal Misty, MD;  Location: [facility];  Service: Vascular;  Laterality: Right;  . EYE SURGERY     Bilateral cataract   . FOOT SURGERY      left Achilles Tendon Repair  . seed implantation  2001   for prostate ca    Outpatient Encounter Medications as of 02/28/2018  Medication Sig  . acetaminophen (TYLENOL) 500 MG tablet Take 500 mg by mouth every 6 (six) hours as needed for mild pain.  Marland Kitchen aspirin 81 MG tablet Take 81 mg by mouth daily.  Marland Kitchen azelastine (ASTELIN) 0.1 % nasal spray Place 2 sprays into both nostrils 2 (two) times daily.   . cetirizine (ZYRTEC) 10 MG tablet Take 10 mg by mouth daily as needed for allergies.   Marland Kitchen dextromethorphan-guaiFENesin (MUCINEX DM) 30-600 MG 12hr tablet Take 1 tablet by mouth daily.   . Multiple Vitamins-Minerals (CENTRUM SILVER PO) Take 1 tablet by mouth daily.   . naproxen sodium (ANAPROX) 220 MG tablet Take 220 mg by mouth daily as needed (pain).  . Omega-3 Fatty Acids (FISH OIL PO) Take 1 capsule by mouth daily.  . pantoprazole (PROTONIX) 40 MG tablet TAKE 1 TABLET DAILY,30 MINUTES BEFORE DINNER.  Marland Kitchen Resveratrol 100 MG CAPS Take 100 mg by mouth every evening.   . rosuvastatin (CRESTOR) 5 MG tablet Take 5 mg by mouth at bedtime.  . [DISCONTINUED] L-Methylfolate-B12-B6-B2 (CEREFOLIN) 08-20-48-5 MG TABS Take 1 tablet by mouth daily.  . [DISCONTINUED] predniSONE (DELTASONE) 10 MG tablet TAKE 1 TABLET EVERY DAY WITH BREAKFAST.   Facility-Administered Encounter Medications as of 02/28/2018  Medication  . aspirin EC tablet 81 mg    Allergies  Allergen Reactions  . Sudafed [Pseudoephedrine Hcl]     Causes dizziness and  nervousness  . Atorvastatin Other (See Comments)    Reaction: Patient's family reports "confusion" and would prefer crestor  Immunization History  Administered Date(s) Administered  . Influenza, High Dose Seasonal PF 12/03/2015, 12/23/2016, 01/31/2018  . Influenza-Unspecified 12/21/2014  . Pneumococcal Conjugate-13 02/28/2014  . Pneumococcal Polysaccharide-23 03/23/1999    Current Medications, Allergies, Past Medical History, Past Surgical History, Family History, and Social History were reviewed in [employer] record.   Review of Systems             All symptoms NEG except where BOLDED >>  Constitutional:  F/C/S, fatigue, anorexia, unexpected weight change. HEENT:  HA, visual changes, hearing loss, earache, nasal symptoms, sore throat, mouth sores, hoarseness. Resp:  cough, sputum, hemoptysis; SOB, tightness, wheezing. Cardio:  CP, palpit, DOE, orthopnea, edema. GI:  N/V/D/C, blood in stool; reflux, abd pain, distention, gas. GU:  dysuria, freq, urgency, hematuria, flank pain, voiding difficulty. MS:  joint pain, swelling, tenderness, decr ROM; neck pain, back pain, etc. Neuro:  HA, tremors, seizures, dizziness, syncope, weakness, numbness, gait abn. Skin:  suspicious lesions or skin rash. Heme:  adenopathy, bruising, bleeding. Psyche:  Memory loss, confusion, agitation, sleep disturbance, hallucinations, anxiety, depression, suicidal.   Objective:   Physical Exam       Vital Signs:  Reviewed...   General:  WD, WN, 85 y/o WM in NAD; alert & oriented; pleasant & cooperative... HEENT:  [place]/AT; Conjunctiva- pink, Sclera- nonicteric, EOM-wnl, PERRLA, EACs-clear, TMs-wnl; NOSE-clear; THROAT-clear & wnl.  Neck:  Supple w/ fair ROM; no JVD; normal carotid impulses w/o bruits; no thyromegaly or nodules palpated; no lymphadenopathy.  Chest:  Bibasilar crackles ~1/3rd way up the back;  No wheezing, rhonchi, signs of consolidation... Heart:  Regular Rhythm; norm  S1 & S2 without murmurs, rubs, or gallops detected. Abdomen:  Soft & nontender- no guarding or rebound; normal bowel sounds; no organomegaly or masses palpated. Ext:  Normal ROM; without deformities or arthritic changes; no varicose veins, venous insuffic, or edema;  Pulses intact w/o bruits. Neuro:  No focal neuro deficits; sensory testing normal; gait normal & balance OK. Derm:  No lesions noted; no rash etc. Lymph:  No cervical, supraclavicular, axillary, or inguinal adenopathy palpated.   Assessment:      IMP >>     Interstitial Lung Disease-- UIP/ IPF w/ characteristic Hi-res CT Chest findings and collagen-vasc labs showing elev Rheum Factor>600 & Sed=34, otherw neg Anti-CCP/ ANA/ & CRP    CARDIAC issues>  53 y/o, HBP, vasc dis w/ bilat carotid dis (s/p bilat CAEs) & AAA (5.9cm- s/p endovasc repair 05/2016 by Loel Dubonnet), TIA/ stroke, HL; followed by Cherly [name] & [name]/ Donzetta Matters...    MEDICAL issues>  52 y/o, HBP, vasc dis w/ bilat carotid dis (s/p bilat CAEs) & AAA, TIA/ stroke, HL, prostate ca (s/p seeds), DJD/ spondylosis, mild dementia; followed by [name] & [name]...  PLAN >>  06/24/15>   We discussed pulmonary fibrosis and the evaluation- they are familiar w/ this dis since a church friend was recently diagnosed and has since passed away; they are understandably concerned and I pointed out some differences in [place] presentation (ie- he has had this on his CXR w/o much change since 2015);  We will proceed w/ Labs and Hi-res CT Chest... 07/11/15>   Pt & family have opted for a middle-of-the-road approach and declined further testing & Antifibrotic therapy; he appears to need O2 rx w/ activities now but is reluctant to accept this (we will check ONO & make final recommendation);  Plus we will give him a trial of Pred 10mg /d... 08/27/15>   Bill is stable on the  Pred10 & tol well; we wrote for a handicap placard; discussed asking [employer] company for a POC to encourage using oxygen w/  activities. 12/03/15>   Stable IPF/UIP on Pred10 + home O2, continue same; given 2017 Flu vaccine today, rec to add Delsym- 2tsp Bid for cough... 02/10/16>   This episode may have been triggered by an upper resp infection, nocturnal reflux and LPR, or an IPF exac-- rec treatment as above w/ O2, Depo/Pred bump, Levaquin, Mucinex, Hydromet, plus we initiated a vigorous antireflux protocol w/ PPI before dinner, NPO after dinner, elev HOB 6". 07/20/16>   Bill has gone thru major surg w/ Ao/Bi-iliac stent graft;  His IPF has deteriorated & I wonder if her had an exacerbation during this time? Now on 3L/min 7 we have maintained Pred 10mg /d.. 10/21/16>   Stable- continue same meds. 02/22/17>   Bill is overall stable w/ his severe atherosclerotic vasc dis, mild cognitive impairment/ vasc dementia, and pulm fibrosis on O3 at [place];  We decided to continue same, gave him a Rx for  ZPak prn use this winter, & rec to stay active, call for any problems.  06/23/17>   Bill is stable overall, he has been taking his ASA81 & we will add this to his med list; LABS were done by his PCP & they will get copies for [place] to scan into Epic; Breathing appears to be about at baseline- he has signif pulm fibrosis, O2 dependent, & is too sedentary- again asked to incr his activ level; REC to continue same meds & call for any questions. 10/26/17>   Bill is stable- pulm fibrosis, ischemic cardiomyopathy, extensive atherosclerotic vasc dis, & medical issues w/ HBP/ HL/ prostate cancer/ DJD & spondylosis/ hx stoke & dementia; we have maintined him on Pred10, O2 encouraged 24/7 and he is clinically & roentgenographically stable- continue same Rx & f/u in 4 months for recheck in advance of my retirement. 02/28/18>   Bill remains stable- again asked to incr exercise & establish a good regimen;  We discussed my up coming retirement & we will arrange for a pulm follow up visit w/ DrMcQuade in 3-72months...       Plan:     CONTINUE HOME OXYGEN  at 2-3L/min regularly...  Patient's Medications  New Prescriptions   No medications on file  Previous Medications   ACETAMINOPHEN (TYLENOL) 500 MG TABLET    Take 500 mg by mouth every 6 (six) hours as needed for mild pain.   ASPIRIN 81 MG TABLET    Take 81 mg by mouth daily.   AZELASTINE (ASTELIN) 0.1 % NASAL SPRAY    Place 2 sprays into both nostrils 2 (two) times daily.    CETIRIZINE (ZYRTEC) 10 MG TABLET    Take 10 mg by mouth daily as needed for allergies.    DEXTROMETHORPHAN-GUAIFENESIN (MUCINEX DM) 30-600 MG 12HR TABLET    Take 1 tablet by mouth daily.    MULTIPLE VITAMINS-MINERALS (CENTRUM SILVER PO)    Take 1 tablet by mouth daily.    NAPROXEN SODIUM (ANAPROX) 220 MG TABLET    Take 220 mg by mouth daily as needed (pain).   OMEGA-3 FATTY ACIDS (FISH OIL PO)    Take 1 capsule by mouth daily.   PANTOPRAZOLE (PROTONIX) 40 MG TABLET    TAKE 1 TABLET DAILY,30 MINUTES BEFORE DINNER.   RESVERATROL 100 MG CAPS    Take 100 mg by mouth every evening.    ROSUVASTATIN (CRESTOR) 5 MG TABLET  Take 5 mg by mouth at bedtime.  Modified Medications   Modified Medication Previous Medication   L-METHYLFOLATE-B12-B6-B2 (CEREFOLIN) 08-20-48-5 MG TABS L-Methylfolate-B12-B6-B2 (CEREFOLIN) 08-20-48-5 MG TABS      TAKE 1 TABLET BY MOUTH EVERY DAY    Take 1 tablet by mouth daily.   PREDNISONE (DELTASONE) 10 MG TABLET predniSONE (DELTASONE) 10 MG tablet      TAKE 1 TABLET EVERY DAY WITH BREAKFAST.    TAKE 1 TABLET EVERY DAY WITH BREAKFAST.  Discontinued Medications   No medications on file

## 2018-05-30 ENCOUNTER — Encounter: Payer: Self-pay | Admitting: Pulmonary Disease

## 2018-05-30 ENCOUNTER — Ambulatory Visit: Payer: Medicare Other | Admitting: Pulmonary Disease

## 2018-05-30 VITALS — BP 118/60 | HR 88 | Ht 70.0 in | Wt 173.0 lb

## 2018-05-30 DIAGNOSIS — J84112 Idiopathic pulmonary fibrosis: Secondary | ICD-10-CM | POA: Diagnosis not present

## 2018-05-30 DIAGNOSIS — J9611 Chronic respiratory failure with hypoxia: Secondary | ICD-10-CM | POA: Diagnosis not present

## 2018-05-30 NOTE — Patient Instructions (Addendum)
Usual interstitial pneumonia: This has been slower to progress than I would anticipate for idiopathic pulmonary fibrosis; interestingly, a positive rheumatoid factor test collected in 2017 suggest that perhaps this is related to a connective tissue disease which sometimes can be very slow to progress.  I make no major recommendations and management change today. Consider repeat lung function testing Consider a 6-minute walk test Continue prednisone 10 mg daily no matter how you feel  Chronic respiratory failure with hypoxemia: Keep using 3 L of oxygen continuously at rest 5L while walking, we will check your oxygen level while walking today  Please practice good hand hygiene Stay physically active Avoid sick individuals  We will see you back in 3 to 4 months or sooner if needed

## 2018-05-30 NOTE — Progress Notes (Addendum)
Synopsis: Former patient of Dr. Lenna Gilford with interstitial lung disease and ischemic cardiomyopathy.  Diagnosed with idiopathic pulmonary fibrosis by Dr. Lenna Gilford in 2017.  Refused anti-fibrotic's after lengthy consideration with multiple family members and personal experience with a close friend who did very poorly with anti-fibrotic therapy.  Has been managed with 10 mg of prednisone daily.  Was started on 3 L of oxygen continuously (pulse via portable oxygen concentrator) in 2017.  Subjective:   PATIENT ID: [name] [name] GENDER: male DOB: 1930-01-19, MRN: [id number]   HPI  Chief Complaint  Patient presents with  . Follow-up    doing well, former SN pt   This is the first time I am meeting [name], he is a former patient of my partner.  He was diagnosed with idiopathic pulmonary fibrosis in 2017.  His wife provides most of the history today because of his memory problems which is becoming more of an issue for him over the years.  He worked for [employer] for many decades, retired in 2002.  He has been an avid golfer for all of his life.  Over the last 2 to 3 years she has had several vascular procedures including an abdominal aortic aneurysm management as well as carotid artery endarterectomies.  In 2017 he was having a dry cough and increasing shortness of breath on exertion so he was referred to our clinic and was diagnosed with idiopathic pulmonary fibrosis.  At that time there was lengthy conversation with Dr. Lanell Persons as well as with the patient's family regarding anti-fibrotic therapy and they felt that this was not the best decision for him.  He was started on 10 mg of prednisone and has been maintained on that ever since.  His wife says that over the last 4 to 5 months he has been playing less golf and is been staying in the house more.  He still does some driving.  She says that he is not short of breath.  He confirms this.  He never feels short of breath with exertion.   He does not have problems with mucus production but he does have a cough throughout the day.  She says he has some postnasal drip.   Past Medical History:  Diagnosis Date  . AAA (abdominal aortic aneurysm) ([place])   . Allergy   . Carotid artery stenosis   . Diplopia   . GERD (gastroesophageal reflux disease)   . Hyperlipidemia   . Memory loss   . Prostate cancer ([place])   . Pulmonary fibrosis ([place])   . Spondylisthesis 2015   Spine  . Stroke ([place]) 04/18/13   some weakness left-returning strength  . TIA (transient ischemic attack)      Family History  Problem Relation Age of Onset  . Stroke Mother   . Heart disease Mother        After age 96  . Varicose Veins Mother   . Hypertension Mother   . Cancer Mother        Theadora Rama   . Stroke Father   . Heart attack Father   . Deep vein thrombosis Father   . Anuerysm Father   . Heart disease Brother      Social History   Socioeconomic History  . Marital status: Married    Spouse name: lee  . Number of children: 2  . Years of education: college  . Highest education level: Not on file  Occupational History  . Occupation: retired  Employer: RETIRED    Comment: [employer]  . Financial resource strain: Not on file  . Food insecurity:    Worry: Not on file    Inability: Not on file  . Transportation needs:    Medical: Not on file    Non-medical: Not on file  Tobacco Use  . Smoking status: Former Smoker    Packs/day: 1.00    Years: 14.00    Pack years: 14.00    Types: Cigarettes    Last attempt to quit: 03/27/1965    Years since quitting: 53.2  . Smokeless tobacco: Never Used  . Tobacco comment: occ alcohol  Substance and Sexual Activity  . Alcohol use: Yes    Alcohol/week: 1.0 standard drinks    Types: 1 Shots of liquor per week    Comment: 3-4 drinks week bourbon  . Drug use: No  . Sexual activity: Not Currently  Lifestyle  . Physical activity:    Days per week: Not on file    Minutes per  session: Not on file  . Stress: Not on file  Relationships  . Social connections:    Talks on phone: Not on file    Gets together: Not on file    Attends religious service: Not on file    Active member of club or organization: Not on file    Attends meetings of clubs or organizations: Not on file    Relationship status: Not on file  . Intimate partner violence:    Fear of current or ex partner: Not on file    Emotionally abused: Not on file    Physically abused: Not on file    Forced sexual activity: Not on file  Other Topics Concern  . Not on file  Social History Narrative  . Not on file     Allergies  Allergen Reactions  . Sudafed [Pseudoephedrine Hcl]     Causes dizziness and nervousness  . Atorvastatin Other (See Comments)    Reaction: Patient's family reports "confusion" and would prefer crestor      Outpatient Medications Prior to Visit  Medication Sig Dispense Refill  . acetaminophen (TYLENOL) 500 MG tablet Take 500 mg by mouth every 6 (six) hours as needed for mild pain.    Marland Kitchen aspirin 81 MG tablet Take 81 mg by mouth daily.    Marland Kitchen azelastine (ASTELIN) 0.1 % nasal spray Place 2 sprays into both nostrils 2 (two) times daily.   4  . cetirizine (ZYRTEC) 10 MG tablet Take 10 mg by mouth daily as needed for allergies.     Marland Kitchen dextromethorphan-guaiFENesin (MUCINEX DM) 30-600 MG 12hr tablet Take 1 tablet by mouth daily.     Marland Kitchen L-Methylfolate-B12-B6-B2 (CEREFOLIN) 08-20-48-5 MG TABS TAKE 1 TABLET BY MOUTH EVERY DAY 90 each 3  . Multiple Vitamins-Minerals (CENTRUM SILVER PO) Take 1 tablet by mouth daily.     . naproxen sodium (ANAPROX) 220 MG tablet Take 220 mg by mouth daily as needed (pain).    . Omega-3 Fatty Acids (FISH OIL PO) Take 1 capsule by mouth daily.    . pantoprazole (PROTONIX) 40 MG tablet TAKE 1 TABLET DAILY,30 MINUTES BEFORE DINNER. 30 tablet 6  . predniSONE (DELTASONE) 10 MG tablet TAKE 1 TABLET EVERY DAY WITH BREAKFAST. 30 tablet 1  . Resveratrol 100 MG CAPS Take 100  mg by mouth every evening.     . rosuvastatin (CRESTOR) 5 MG tablet Take 5 mg by mouth at bedtime.  Facility-Administered Medications Prior to Visit  Medication Dose Route Frequency Provider Last Rate Last Dose  . aspirin EC tablet 81 mg  81 mg Oral Daily Garvin Fila, MD        Review of Systems  Constitutional: Negative for fever, malaise/fatigue and weight loss.  HENT: Negative for congestion, nosebleeds and sinus pain.   Respiratory: Positive for cough. Negative for sputum production, shortness of breath, wheezing and stridor.       Objective:  Physical Exam   Vitals:   05/30/18 1042  BP: 118/60  Pulse: 88  SpO2: 91%  Weight: 173 lb (78.5 kg)  Height: 5\' 10"  (1.778 m)    3L O2  Walked 500 feet on 3 L pulse, O2 saturation dropped into the 80s, this was increased to 5 L pulse and he was able to maintain O2 saturation at 89%  Gen: elderly male, no acute distress HENT: OP clear, TM's clear, neck supple PULM: Coarse crackles left lung more than right B, normal percussion CV: RRR, no mgr, trace edema GI: BS+, soft, nontender Derm: no cyanosis or rash Psyche: normal mood and affect   CBC    Component Value Date/Time   WBC 12.0 (H) 07/20/2016 1301   RBC 4.14 (L) 07/20/2016 1301   HGB 12.8 (L) 07/20/2016 1301   HCT 38.6 (L) 07/20/2016 1301   PLT 213.0 07/20/2016 1301   MCV 93.2 07/20/2016 1301   MCH 32.0 06/18/2016 0630   MCHC 33.1 07/20/2016 1301   RDW 13.9 07/20/2016 1301   LYMPHSABS 2.8 07/20/2016 1301   MONOABS 0.6 07/20/2016 1301   EOSABS 0.2 07/20/2016 1301   BASOSABS 0.1 07/20/2016 1301     Chest imaging: 2017 CT scan of the chest images independently reviewed showing honeycombing and peripheral interlobular septal thickening and traction bronchiectasis worse in the left lung, interestingly worst in the left upper lobe, there is also some similar changes in the right lung but not as severe 2009 CT abdomen lung windows independently reviewed  showing early findings of traction bronchiectasis, septal thickening and interstitial change  PFT: March 2017 forced vital capacity 2.83 L 73% predicted, DLCO 7.47 23% predicted  Labs: 2017 ANA negative, CCP negative, rheumatoid factor greater than 600  Path:  Echo:  Heart Catheterization:       Assessment & Plan:   IPF (idiopathic pulmonary fibrosis) (HCC)  Chronic hypoxemic respiratory failure (HCC)  Discussion: Rush Landmark has usual interstitial pneumonia, it has been very slow to progress over the years.  Given the remarkably elevated rheumatoid factor from 2017 and the slow progression over time (I was even able to see interstitial changes from 2009) I think that it is likely he has something other than IPF.  Regardless, his management has been excellent because he has been slow to progress over the years so I see no reason to change.  We did talk about the value of repeat lung function testing and perhaps 6-minute walk testing as well as a way to monitor disease activity.  His wife will think about whether or not she wants to continue that.  Plan: Usual interstitial pneumonia: This has been slower to progress than I would anticipate for idiopathic pulmonary fibrosis; interestingly, a positive rheumatoid factor test collected in 2017 suggest that perhaps this is related to a connective tissue disease which sometimes can be very slow to progress.  I make no major recommendations and management change today. Consider repeat lung function testing Consider a 6-minute walk test Continue prednisone 10 mg  daily no matter how you feel  Chronic respiratory failure with hypoxemia: Keep using 3 L of oxygen continuously at rest 5L while walking, we will check your oxygen level while walking today  Please practice good hand hygiene Stay physically active Avoid sick individuals  We will see you back in 3 to 4 months or sooner if needed  > 50% of this 40 min visit spent face to  face   Current Outpatient Medications:  .  acetaminophen (TYLENOL) 500 MG tablet, Take 500 mg by mouth every 6 (six) hours as needed for mild pain., Disp: , Rfl:  .  aspirin 81 MG tablet, Take 81 mg by mouth daily., Disp: , Rfl:  .  azelastine (ASTELIN) 0.1 % nasal spray, Place 2 sprays into both nostrils 2 (two) times daily. , Disp: , Rfl: 4 .  cetirizine (ZYRTEC) 10 MG tablet, Take 10 mg by mouth daily as needed for allergies. , Disp: , Rfl:  .  dextromethorphan-guaiFENesin (MUCINEX DM) 30-600 MG 12hr tablet, Take 1 tablet by mouth daily. , Disp: , Rfl:  .  L-Methylfolate-B12-B6-B2 (CEREFOLIN) 08-20-48-5 MG TABS, TAKE 1 TABLET BY MOUTH EVERY DAY, Disp: 90 each, Rfl: 3 .  Multiple Vitamins-Minerals (CENTRUM SILVER PO), Take 1 tablet by mouth daily. , Disp: , Rfl:  .  naproxen sodium (ANAPROX) 220 MG tablet, Take 220 mg by mouth daily as needed (pain)., Disp: , Rfl:  .  Omega-3 Fatty Acids (FISH OIL PO), Take 1 capsule by mouth daily., Disp: , Rfl:  .  pantoprazole (PROTONIX) 40 MG tablet, TAKE 1 TABLET DAILY,30 MINUTES BEFORE DINNER., Disp: 30 tablet, Rfl: 6 .  predniSONE (DELTASONE) 10 MG tablet, TAKE 1 TABLET EVERY DAY WITH BREAKFAST., Disp: 30 tablet, Rfl: 1 .  Resveratrol 100 MG CAPS, Take 100 mg by mouth every evening. , Disp: , Rfl:  .  rosuvastatin (CRESTOR) 5 MG tablet, Take 5 mg by mouth at bedtime., Disp: , Rfl:   Current Facility-Administered Medications:  .  aspirin EC tablet 81 mg, 81 mg, Oral, Daily, Garvin Fila, MD

## 2018-06-06 NOTE — Progress Notes (Deleted)
[facility]  PATIENT: [name] [name] DOB: 1929/03/25   REASON FOR VISIT: Follow-up for history of stroke, mild cognitive impairment  HISTORY FROM: Patient and wife   HISTORY OF PRESENT ILLNESS: 85 year Caucasian male seen for first office followup visit from hospital consideration for stroke in January 2015. He presented with difficulty picking things up with his left hand as well as grip weakness while playing golf. He also subsequently diescribed, headache and some speech and language difficulties and double vision. His symptoms improved soon after arrival and hence he was not considered for thrombolysis. CT scan of the head showed an area of low attenuation in the right anterior limb internal capsule suggestive of a subacute lacunar infarct which was subsequently confirmed on MRI . 2D echo showed ejection fraction of 45-50% with diffuse hypokinesis. Carotid Doppler showed progressive 80-99% right ICA stenosis and 1-39% left ICA stenosis at the previous surgical site. Vascular surgery was consulted . Patient was started on aspirin. Patient was scheduled for and underwent elective right carotid endarterectomy by Dr Kellie Simmering and the procedure went well. Patient was placed on Plavix by me but after the surgery was changed to aspirin. He states his blood pressure is fine and is 103/65 today and it usually runs in the 120s at home. The patient's wife has noted that he has subacute decline in his memory as well as multitasking and cognitive abilities. She is concerned about this and would like evaluation. The patient himself denies this. He has not had an evaluation for these complaints before.   UPDATE 08/27/13 (LL): Since last visit, patient has continued taking Cerefolin NAC for memory. He does not notice any change in his memory, but states he knows his short term memory is not as good as it used to be. He is tolerating Plavix well with no signs of significant bleeding or bruising. His  blood pressure is well controlled, it is 125/79 in the office today. He states he stays active, plays golf a couple days a week. Update 05/10/2014 PShe returns for follow-up accompanied by his wife. He states his memory difficulties are unchanged. He in fact is doing well and is still able to handle finances for the home and business. He is taking fish oil as well as Cerefolin everyday. The patient also remains on Plavix which is tolerating well without bleeding or bruising. He had lipid profile checked in December 2015 by primary physician which was fine. He also had follow-up carotid ultrasound done last week by Dr. Kellie Simmering which was okay as well. He exercises regularly and plays golf as well. He has no new complaints. He has not had any recurrent stroke or TIA symptoms. Update 11/05/2014:PS He returns for follow-up after last visit 6 months ago. He is accompanied by his wife. He states his short-term memory difficulties continue. He has occasional fecal times in judgment and multitasking. However overall his symptoms seem stable. He continues to take Cerefolin daily. He does participate in doing sudoku several times a week. He does play golf twice a week. He did have a follow-up carotid ultrasound checked in February and Dr. Evelena Leyden office and it was fine. He remains on Plavix which is tolerating well without bleeding or bruising. He has not had any recurrent stroke or TIA symptoms. He remains on Crestor without any myalgias or arthralgias. His blood pressure is elevated today in office at 150/93 but he blames this on white coat hypertension. He did have lipid profile checked 2 months ago by  Dr. Laurann Montana and it was fine.  UPDATE 05/06/2015 CMMr. [place], 85 year old male returns for follow-up. He is accompanied by his wife [name] [name]. He has not had further stroke or TIA symptoms since last seen. He is currently on Plavix with minimal bruising. His memory has not progressed since last seen. He continues to play golf  several times a week and participates in card games, and sudoku. He remains on Cerefolin. His lipids are followed by primary care and he remains on Crestor without side effects. Carotid Dopplers followed yearly by Dr. Kellie Simmering. He returns for reevaluation UPDATE 08/15/2017CM [name] [name], 85 year old returns for follow-up with his wife. He has a history of stroke in January 2015. He has not had further stroke or TIA symptoms since that time he is currently on Plavix with minimal bruising. His memory is stable, MMSE 27/30. He continues to play golf twice a week and participate in other activities to stimulate his mind. He remains on Cerefolin. He was recently diagnosed with pulmonary fibrosis and is using oxygen at night. He remains on Crestor and lipids are followed by primary care Dr. Laurann Montana.  Carotid Doppler is followed by [facility]. Doppler is due to February 2018. He returns for reevaluation Update 2/13/2018PS : He returns for follow-up after last visit 6 months ago. He is accompanied by his wife. He states his doing well. He has had no recurrent stroke or TIA symptoms now for 2 years. He is on Plavix but is having significant skin bruising. He in fact is currently having 2 areas that he has Band-Aids because of the bruising. Patient does have regular follow up with vascular surgeon for his aortic aneurysm as well as carotid surgery surveillance. He states her blood pressure is under good control and today it is 130/83. He recently had lipid profile checked on January 2 with LDL cholesterol was 63, HDL 77, triglycerides 116 and total cholesterol 163 mg percent. He has been diagnosed with pulmonary fibrosis and started on home oxygen at night only. He states he feels and in the day. Vistaril memory difficulties about the same. He remains on Cerefolin NAC and Reservatrol. He is mostly independent in history driving. He has short-term memory difficulties mostly. He has no other new complaints Update 12/01/2016 CM: He  returns for follow-up after last of that 6 months ago. He is accompanied by his wife. They feel his memory difficulties about unchanged. She still has mild short-term memory difficulties. He does keep himself busy in mentally challenging activities like doing sudoku and playing computer games. He is on home oxygen with pulmonary fibrosis which limits his physical activity. He has noticed diminished bruising after changing from Plavix to aspirin. He is had no recurrent stroke or TIA symptoms. He has no new complaints today. The patient has his wife has decided not to participate and early dementia studies at the present time hence they do not have the time UPDATE 3/12/2019CM [name] [name], 85 year old male returns for follow-up with history of mild cognitive impairment and stroke event in 2015.  He is supposed to be on aspirin for secondary stroke prevention however he has not been taking this.  He has not had further stroke or TIA symptoms he had significant bruising on Plavix.  He remains on Crestor without myalgias.  Blood pressure in the office today 120/65.  His mild cognitive impairment is stable.  He also has a history of pulmonary fibrosis which limits his physical activity.  He is on oxygen therapy.  He tries to remain  physically active but tires easily.  He still does computer games etc.  He returns for reevaluation UPDATE 9/18/2019CM [name] [name], 85 year old male returns for follow-up with a history of stroke event in 2015 and mild cognitive impairment.  He remains on aspirin for secondary stroke prevention without recurrent stroke or TIA symptoms he has minimal bruising and no bleeding.  Blood pressure in the office today 116 at 72.  He remains on Crestor without myalgias recent LDL 62 according to the wife.  He has a history of pulmonary fibrosis and uses oxygen as needed.  He says he still plays golf at least once a week.  He tries to stay as active as possible.  His mild cognitive impairment is stable.  He  continues to do exercises to stimulate his memory.  He returns for reevaluation REVIEW OF SYSTEMS: Full 14 system review of systems performed and notable only for those listed, all others are neg:  Constitutional: neg  Cardiovascular: neg Ear/Nose/Throat: neg  Skin: neg Eyes: neg Respiratory: Shortness of breath on oxygen Gastroitestinal: neg  Hematology/Lymphatic: neg  Endocrine: neg Musculoskeletal:neg Allergy/Immunology: neg Neurological: Mild cognitive impairment, history of stroke Psychiatric: neg Sleep : neg   ALLERGIES: Allergies  Allergen Reactions   Sudafed [Pseudoephedrine Hcl]     Causes dizziness and nervousness   Atorvastatin Other (See Comments)    Reaction: Patient's family reports "confusion" and would prefer crestor     HOME MEDICATIONS: Outpatient Medications Prior to Visit  Medication Sig Dispense Refill   acetaminophen (TYLENOL) 500 MG tablet Take 500 mg by mouth every 6 (six) hours as needed for mild pain.     aspirin 81 MG tablet Take 81 mg by mouth daily.     azelastine (ASTELIN) 0.1 % nasal spray Place 2 sprays into both nostrils 2 (two) times daily.   4   cetirizine (ZYRTEC) 10 MG tablet Take 10 mg by mouth daily as needed for allergies.      dextromethorphan-guaiFENesin (MUCINEX DM) 30-600 MG 12hr tablet Take 1 tablet by mouth daily.      L-Methylfolate-B12-B6-B2 (CEREFOLIN) 08-20-48-5 MG TABS TAKE 1 TABLET BY MOUTH EVERY DAY 90 each 3   Multiple Vitamins-Minerals (CENTRUM SILVER PO) Take 1 tablet by mouth daily.      naproxen sodium (ANAPROX) 220 MG tablet Take 220 mg by mouth daily as needed (pain).     Omega-3 Fatty Acids (FISH OIL PO) Take 1 capsule by mouth daily.     pantoprazole (PROTONIX) 40 MG tablet TAKE 1 TABLET DAILY,30 MINUTES BEFORE DINNER. 30 tablet 6   predniSONE (DELTASONE) 10 MG tablet TAKE 1 TABLET EVERY DAY WITH BREAKFAST. 30 tablet 1   Resveratrol 100 MG CAPS Take 100 mg by mouth every evening.      rosuvastatin  (CRESTOR) 5 MG tablet Take 5 mg by mouth at bedtime.     Facility-Administered Medications Prior to Visit  Medication Dose Route Frequency Provider Last Rate Last Dose   aspirin EC tablet 81 mg  81 mg Oral Daily Garvin Fila, MD        PAST MEDICAL HISTORY: Past Medical History:  Diagnosis Date   AAA (abdominal aortic aneurysm) ([place])    Allergy    Carotid artery stenosis    Diplopia    GERD (gastroesophageal reflux disease)    Hyperlipidemia    Memory loss    Prostate cancer ([place])    Pulmonary fibrosis ([place])    Spondylisthesis 2015   Spine   Stroke ([place]) 04/18/13  some weakness left-returning strength   TIA (transient ischemic attack)     PAST SURGICAL HISTORY: Past Surgical History:  Procedure Laterality Date   ABDOMINAL AORTIC ENDOVASCULAR STENT GRAFT N/A 06/17/2016   Procedure: ABDOMINAL AORTIC ENDOVASCULAR STENT GRAFT;  Surgeon: Waynetta Sandy, MD;  Location: [facility] OR;  Service: Vascular;  Laterality: N/A;   CAROTID ENDARTERECTOMY  Sept. 2008   Elective left carotid endarterectomy   CAROTID ENDARTERECTOMY Right 04/27/2013   CE   ENDARTERECTOMY Right 04/27/2013   Procedure: ENDARTERECTOMY CAROTID;  Surgeon: Mal Misty, MD;  Location: [facility] OR;  Service: Vascular;  Laterality: Right;   EYE SURGERY     Bilateral cataract    FOOT SURGERY      left Achilles Tendon Repair   seed implantation  2001   for prostate ca    FAMILY HISTORY: Family History  Problem Relation Age of Onset   Stroke Mother    Heart disease Mother        After age 83   Varicose Veins Mother    Hypertension Mother    Cancer Mother        Theadora Rama    Stroke Father    Heart attack Father    Deep vein thrombosis Father    Anuerysm Father    Heart disease Brother     SOCIAL HISTORY: Social History   Socioeconomic History   Marital status: Married    Spouse name: lee   Number of children: 2   Years of education: college   Highest education  level: Not on file  Occupational History   Occupation: retired    Fish farm manager: RETIRED    Comment: [place] resource strain: Not on file   Food insecurity:    Worry: Not on file    Inability: Not on Lexicographer needs:    Medical: Not on file    Non-medical: Not on file  Tobacco Use   Smoking status: Former Smoker    Packs/day: 1.00    Years: 14.00    Pack years: 14.00    Types: Cigarettes    Last attempt to quit: 03/27/1965    Years since quitting: 53.2   Smokeless tobacco: Never Used   Tobacco comment: occ alcohol  Substance and Sexual Activity   Alcohol use: Yes    Alcohol/week: 1.0 standard drinks    Types: 1 Shots of liquor per week    Comment: 3-4 drinks week bourbon   Drug use: No   Sexual activity: Not Currently  Lifestyle   Physical activity:    Days per week: Not on file    Minutes per session: Not on file   Stress: Not on file  Relationships   Social connections:    Talks on phone: Not on file    Gets together: Not on file    Attends religious service: Not on file    Active member of club or organization: Not on file    Attends meetings of clubs or organizations: Not on file    Relationship status: Not on file   Intimate partner violence:    Fear of current or ex partner: Not on file    Emotionally abused: Not on file    Physically abused: Not on file    Forced sexual activity: Not on file  Other Topics Concern   Not on file  Social History Narrative   Not on file     PHYSICAL EXAM  There were no vitals filed for this visit. There is no height or weight on file to calculate BMI.  Generalized: Well developed, in no acute distress  Head: normocephalic and atraumatic,. Oropharynx benign  Neck: Supple, no carotid bruits  Cardiac: Regular rate rhythm, no murmur  Musculoskeletal: No deformity   Neurological examination   Mentation: Alert  Clock drawing 4/4 MMSE - Mini Mental State Exam  05/31/2017 12/01/2016 05/04/2016  Orientation to time 4 4 5   Orientation to Place 4 5 4   Registration 3 3 3   Attention/ Calculation 5 5 5   Recall 1 1 3   Language- name 2 objects 2 2 2   Language- repeat 1 1 1   Language- follow 3 step command 3 3 3   Language- read & follow direction 1 1 1   Write a sentence 1 1 1   Copy design 1 1 1   Total score 26 27 29   Follows all commands speech and language fluent.   Cranial nerve II-XII: Pupils were equal round reactive to light extraocular movements were full, visual field were full on confrontational test. Facial sensation and strength were normal. hearing was intact to finger rubbing bilaterally. Uvula tongue midline. head turning and shoulder shrug were normal and symmetric.Tongue protrusion into cheek strength was normal. Motor: normal bulk and tone, full strength in the BUE, BLE, Sensory: normal and symmetric to light touch,  in the upper and lower extremities Coordination: finger-nose-finger, heel-to-shin bilaterally, no dysmetria Reflexes: 1+ upper lower and symmetric plantar responses were flexor bilaterally. Gait and Station: Rising up from seated position without assistance, normal stance,  moderate stride, good arm swing, smooth turning, able to perform tiptoe, and heel walking without difficulty. Tandem gait is steady.  No assistive device  DIAGNOSTIC DATA (LABS, IMAGING, TESTING) - I reviewed patient records, labs, notes, testing and imaging myself where available.  Lab Results  Component Value Date   WBC 12.0 (H) 07/20/2016   HGB 12.8 (L) 07/20/2016   HCT 38.6 (L) 07/20/2016   MCV 93.2 07/20/2016   PLT 213.0 07/20/2016      Component Value Date/Time   NA 139 07/20/2016 1301   K 3.6 07/20/2016 1301   CL 102 07/20/2016 1301   CO2 28 07/20/2016 1301   GLUCOSE 97 07/20/2016 1301   BUN 14 07/20/2016 1301   CREATININE 1.00 07/20/2016 1301   CALCIUM 9.2 07/20/2016 1301   PROT 7.7 07/20/2016 1301   ALBUMIN 3.9 07/20/2016 1301   AST  30 07/20/2016 1301   ALT 26 07/20/2016 1301   ALKPHOS 69 07/20/2016 1301   BILITOT 0.6 07/20/2016 1301   GFRNONAA >60 06/18/2016 0445   GFRAA >60 06/18/2016 0445     Lab Results  Component Value Date   TSH 3.94 07/20/2016      ASSESSMENT AND PLAN 88year with right posterior frontal infarct in January 2015 secondary to symptomatic right ICA stenosis for which he has undergone elective right carotid endarterectomy. Multiple vascular risk factors of carotid stenosis, hyperlipidemia, peripheral vascular disease and history of TIA. He also has pre-existing memory loss which is likely mild cognitive impairment which appears stable.The patient is a current patient of Dr. Leonie Man   who is out of the office today . This note is sent to the work in doctor.      PLAN: Continue aspirin for secondary stroke prevention Continue Cerefolin for mild cognitive impairment.   MMSE is stable Continue Crestor for hyperlipidemia Continue to participate in mentally challenging activities like crossword puzzles, bridge Sudoku.  Continue to  play golf as able Follow-up in 6 months I spent 25 minutes in total face to face time with the patient more than 50% of which was spent counseling and coordination of care, reviewing test results reviewing medications and discussing and reviewing the diagnosis of stroke and management of risk factors along with mild cognitive impairment and compensation strategies Dennie Bible, [facility], [facility], APRN  [facility] [address], [place],  [address] [phone]

## 2018-06-07 ENCOUNTER — Ambulatory Visit: Payer: Medicare Other | Admitting: Nurse Practitioner

## 2018-06-17 DIAGNOSIS — I2510 Atherosclerotic heart disease of native coronary artery without angina pectoris: Secondary | ICD-10-CM | POA: Diagnosis not present

## 2018-06-17 DIAGNOSIS — J84112 Idiopathic pulmonary fibrosis: Secondary | ICD-10-CM | POA: Diagnosis not present

## 2018-06-22 ENCOUNTER — Other Ambulatory Visit: Payer: Self-pay | Admitting: Pulmonary Disease

## 2018-06-28 DIAGNOSIS — Z85828 Personal history of other malignant neoplasm of skin: Secondary | ICD-10-CM | POA: Diagnosis not present

## 2018-06-28 DIAGNOSIS — C44329 Squamous cell carcinoma of skin of other parts of face: Secondary | ICD-10-CM | POA: Diagnosis not present

## 2018-06-28 DIAGNOSIS — D485 Neoplasm of uncertain behavior of skin: Secondary | ICD-10-CM | POA: Diagnosis not present

## 2018-07-05 DIAGNOSIS — Z85828 Personal history of other malignant neoplasm of skin: Secondary | ICD-10-CM | POA: Diagnosis not present

## 2018-07-05 DIAGNOSIS — C44329 Squamous cell carcinoma of skin of other parts of face: Secondary | ICD-10-CM | POA: Diagnosis not present

## 2018-07-25 ENCOUNTER — Other Ambulatory Visit: Payer: Self-pay | Admitting: Pulmonary Disease

## 2018-07-27 DIAGNOSIS — J300 Vasomotor rhinitis: Secondary | ICD-10-CM | POA: Diagnosis not present

## 2018-07-28 ENCOUNTER — Ambulatory Visit (INDEPENDENT_AMBULATORY_CARE_PROVIDER_SITE_OTHER): Payer: Medicare Other

## 2018-07-28 ENCOUNTER — Encounter: Payer: Self-pay | Admitting: Nurse Practitioner

## 2018-07-28 ENCOUNTER — Ambulatory Visit: Payer: Medicare Other | Admitting: Nurse Practitioner

## 2018-07-28 ENCOUNTER — Other Ambulatory Visit: Payer: Self-pay

## 2018-07-28 VITALS — BP 128/76 | HR 104 | Ht 70.0 in | Wt 167.0 lb

## 2018-07-28 DIAGNOSIS — J84112 Idiopathic pulmonary fibrosis: Secondary | ICD-10-CM

## 2018-07-28 DIAGNOSIS — R0602 Shortness of breath: Secondary | ICD-10-CM | POA: Diagnosis not present

## 2018-07-28 DIAGNOSIS — J9611 Chronic respiratory failure with hypoxia: Secondary | ICD-10-CM

## 2018-07-28 MED ORDER — DOXYCYCLINE HYCLATE 100 MG PO TABS: 100.0000 mg | ORAL_TABLET | Freq: Two times a day (BID) | ORAL | 0 refills | Status: DC

## 2018-07-28 NOTE — Patient Instructions (Addendum)
Continue prednisone 10 mg daily no matter how you feel  Chronic respiratory failure with hypoxemia:  Keep using 3 L of oxygen continuously at rest and use 5L of oxygen while walking to keep sats above 89%  Will order Pulmonary rehab  Will check chest x ray and call with results  Please practice good hand hygiene Stay physically active Avoid sick individuals  We will see you back in 3 to 4 months or sooner if needed

## 2018-07-28 NOTE — Assessment & Plan Note (Addendum)
Patient's wife is concerned today because O2 sats have been dropping.  Patient has not been wearing oxygen as advised. He does not wear any oxygen at rest and only wears 4 L pulsed when ambulating/with exertion.  Note: Patient was walked in the office today.  When patient arrived to the office today his O2 sats were at 83% on 4 L pulsed oxygen.  At rest patient's sats were at 91% on 3 L.  While ambulating in the office patient required 5 L of continuous oxygen with exertion.  Patient Instructions  Continue prednisone 10 mg daily no matter how you feel  Chronic respiratory failure with hypoxemia:  Keep using 3 L of oxygen continuously at rest and use 5L of oxygen while walking to keep sats above 89%  Will order Pulmonary rehab  Will check chest x ray and call with results  Please practice good hand hygiene Stay physically active Avoid sick individuals  We will see you back in 3 to 4 months or sooner if needed

## 2018-07-28 NOTE — Assessment & Plan Note (Signed)
Patient's wife states today that he has not been as active in the past few months.  Concerned about deconditioning due to inactivity.  Will order in-home pulmonary rehab if possible.  Patient Instructions  Continue prednisone 10 mg daily no matter how you feel  Chronic respiratory failure with hypoxemia:  Keep using 3 L of oxygen continuously at rest and use 5L of oxygen while walking to keep sats above 89%  Will order Pulmonary rehab  Will check chest x ray and call with results  Please practice good hand hygiene Stay physically active Avoid sick individuals  We will see you back in 3 to 4 months or sooner if needed

## 2018-07-28 NOTE — Progress Notes (Signed)
@Patient  ID: [name] [name], male    DOB: [birth year], 85 y.o.   MRN: [id number]  Chief Complaint  Patient presents with   Follow-up    Visit YQ:MGNOIB. She states without his oxygen he becomes a high fall risk. She states that he is having dizzy spells and labored breathing even with his oxygen on. She states at home his oxygen can run as low as 70.     Referring provider: Kelton Pillar, MD  HPI 85 year old male with IPF and chronic hypoxemic respiratory failure is followed by Dr. Lake Bells.  Tests: Chest imaging: 2017 CT scan of the chest images independently reviewed showing honeycombing and peripheral interlobular septal thickening and traction bronchiectasis worse in the left lung, interestingly worst in the left upper lobe, there is also some similar changes in the right lung but not as severe 2009 CT abdomen lung windows showing early findings of traction bronchiectasis, septal thickening and interstitial change  PFT: March 2017 forced vital capacity 2.83 L 73% predicted, DLCO 7.47 23% predicted  Labs: 2017 ANA negative, CCP negative, rheumatoid factor greater than 600  OV 07/28/18 - Follow up Patient presents today for follow-up.  His wife is with him today.  His wife asked most of the questions due to patient's ongoing memory loss.  She states that recently patient sats have been lower while ambulating.  Patient was last seen by Dr. Lake Bells on 05/30/2018 and was advised to use 3 L of oxygen continuously at rest and 5 L while ambulating.  Patient's wife states that he has not been using his oxygen as directed.  Does not wear any oxygen at rest and only wears 4 L pulsed when ambulating/with exertion.  Wife states that patient has not been as active lately since they have been on isolation due to [place] restrictions.  Patient denies any recent cough or fever. Denies f/c/s, n/v/d, hemoptysis, PND, leg swelling.    Note: Patient was walked in the office today.  When patient arrived to the  office today his O2 sats were at 83% on 4 L pulsed oxygen.  At rest patient's sats were at 91% on 3 L.  While ambulating in the office patient required 5 L of continuous oxygen with exertion.   Allergies  Allergen Reactions   Sudafed [Pseudoephedrine Hcl]     Causes dizziness and nervousness   Atorvastatin Other (See Comments)    Reaction: Patient's family reports "confusion" and would prefer crestor     Immunization History  Administered Date(s) Administered   Influenza, High Dose Seasonal PF 12/03/2015, 12/23/2016, 01/31/2018   Influenza-Unspecified 12/21/2014   Pneumococcal Conjugate-13 02/28/2014   Pneumococcal Polysaccharide-23 03/23/1999    Past Medical History:  Diagnosis Date   AAA (abdominal aortic aneurysm) (HCC)    Allergy    Carotid artery stenosis    Diplopia    GERD (gastroesophageal reflux disease)    Hyperlipidemia    Memory loss    Prostate cancer ([place])    Pulmonary fibrosis ([place])    Spondylisthesis 2015   Spine   Stroke ([place]) 04/18/13   some weakness left-returning strength   TIA (transient ischemic attack)     Tobacco History: Social History   Tobacco Use  Smoking Status Former Smoker   Packs/day: 1.00   Years: 14.00   Pack years: 14.00   Types: Cigarettes   Last attempt to quit: 03/27/1965   Years since quitting: 53.3  Smokeless Tobacco Never Used  Tobacco Comment   occ alcohol   Counseling given:  Not Answered Comment: occ alcohol   Outpatient Encounter Medications as of 07/28/2018  Medication Sig   acetaminophen (TYLENOL) 500 MG tablet Take 500 mg by mouth every 6 (six) hours as needed for mild pain.   aspirin 81 MG tablet Take 81 mg by mouth daily.   azelastine (ASTELIN) 0.1 % nasal spray Place 2 sprays into both nostrils 2 (two) times daily.    cetirizine (ZYRTEC) 10 MG tablet Take 10 mg by mouth daily as needed for allergies.    dextromethorphan-guaiFENesin (MUCINEX DM) 30-600 MG 12hr tablet Take 1 tablet by  mouth daily.    L-Methylfolate-B12-B6-B2 (CEREFOLIN) 08-20-48-5 MG TABS TAKE 1 TABLET BY MOUTH EVERY DAY   Multiple Vitamins-Minerals (CENTRUM SILVER PO) Take 1 tablet by mouth daily.    naproxen sodium (ANAPROX) 220 MG tablet Take 220 mg by mouth daily as needed (pain).   Omega-3 Fatty Acids (FISH OIL PO) Take 1 capsule by mouth daily.   pantoprazole (PROTONIX) 40 MG tablet TAKE 1 TABLET DAILY,30 MINUTES BEFORE DINNER.   predniSONE (DELTASONE) 10 MG tablet TAKE 1 TABLET EVERY DAY WITH BREAKFAST.   Resveratrol 100 MG CAPS Take 100 mg by mouth every evening.    rosuvastatin (CRESTOR) 5 MG tablet Take 5 mg by mouth at bedtime.   Facility-Administered Encounter Medications as of 07/28/2018  Medication   aspirin EC tablet 81 mg     Review of Systems  Review of Systems  Constitutional: Negative.  Negative for chills and fever.  HENT: Negative.   Respiratory: Positive for shortness of breath. Negative for cough and wheezing.   Cardiovascular: Negative.  Negative for chest pain, palpitations and leg swelling.  Gastrointestinal: Negative.   Allergic/Immunologic: Negative.   Neurological: Negative.   Psychiatric/Behavioral: Negative.        Physical Exam  BP 128/76    Pulse (!) 104    Ht 5\' 10"  (1.778 m)    Wt 167 lb (75.8 kg)    SpO2 93%    BMI 23.96 kg/m   Wt Readings from Last 5 Encounters:  07/28/18 167 lb (75.8 kg)  05/30/18 173 lb (78.5 kg)  02/28/18 179 lb (81.2 kg)  12/07/17 178 lb 3.2 oz (80.8 kg)  10/26/17 180 lb 9.6 oz (81.9 kg)     Physical Exam Vitals signs and nursing note reviewed.  Constitutional:      General: He is not in acute distress.    Appearance: He is well-developed.  Cardiovascular:     Rate and Rhythm: Normal rate and regular rhythm.  Pulmonary:     Effort: Pulmonary effort is normal. No respiratory distress.     Breath sounds: Normal breath sounds. No wheezing or rhonchi.  Musculoskeletal:        General: No swelling.  Skin:     General: Skin is warm and dry.  Neurological:     Mental Status: He is alert and oriented to person, place, and time.       Assessment & Plan:   Chronic hypoxemic respiratory failure [facility]) Patient's wife is concerned today because O2 sats have been dropping.  Patient has not been wearing oxygen as advised. He does not wear any oxygen at rest and only wears 4 L pulsed when ambulating/with exertion.  Note: Patient was walked in the office today.  When patient arrived to the office today his O2 sats were at 83% on 4 L pulsed oxygen.  At rest patient's sats were at 91% on 3 L.  While ambulating in the office  patient required 5 L of continuous oxygen with exertion.  Patient Instructions  Continue prednisone 10 mg daily no matter how you feel  Chronic respiratory failure with hypoxemia:  Keep using 3 L of oxygen continuously at rest and use 5L of oxygen while walking to keep sats above 89%  Will order Pulmonary rehab  Will check chest x ray and call with results  Please practice good hand hygiene Stay physically active Avoid sick individuals  We will see you back in 3 to 4 months or sooner if needed      IPF (idiopathic pulmonary fibrosis) ([place]) Patient's wife states today that he has not been as active in the past few months.  Concerned about deconditioning due to inactivity.  Will order in-home pulmonary rehab if possible.  Patient Instructions  Continue prednisone 10 mg daily no matter how you feel  Chronic respiratory failure with hypoxemia:  Keep using 3 L of oxygen continuously at rest and use 5L of oxygen while walking to keep sats above 89%  Will order Pulmonary rehab  Will check chest x ray and call with results  Please practice good hand hygiene Stay physically active Avoid sick individuals  We will see you back in 3 to 4 months or sooner if needed       Fenton Foy, NP 07/28/2018

## 2018-07-28 NOTE — Addendum Note (Signed)
Addended by: Vivia Ewing on: 07/28/2018 03:07 PM   Modules accepted: Orders

## 2018-07-29 NOTE — Progress Notes (Signed)
Reviewed, agree 

## 2018-07-31 ENCOUNTER — Telehealth: Payer: Self-pay | Admitting: Nurse Practitioner

## 2018-07-31 DIAGNOSIS — R5381 Other malaise: Secondary | ICD-10-CM

## 2018-07-31 NOTE — Telephone Encounter (Signed)
He will need home PT for physical deconditioning/pulmonaryrehab

## 2018-07-31 NOTE — Telephone Encounter (Signed)
Called and spoke with Carolynn in regards to info stated by [place] for pt to have home PT due to physical deconditioning and Carolynn expressed understanding and asked for order to be placed. Order has been placed for pt to have physical therapy and I stated in there for it to be home physical therapy. Nothing further needed.

## 2018-07-31 NOTE — Telephone Encounter (Signed)
Spoke with Hoyle Sauer from [facility]. She received an order for the patient to have pulmonary rehab at home. She wanted to know if the patient will need to have skilled nursing or physical therapy to do the therapy or if the patient will need both.   If so, she will need the order to replaced to state what exactly is needed.   Tonya, please advise. Thanks!

## 2018-08-03 ENCOUNTER — Telehealth: Payer: Self-pay | Admitting: Nurse Practitioner

## 2018-08-03 NOTE — Telephone Encounter (Signed)
LMTCB

## 2018-08-03 NOTE — Telephone Encounter (Signed)
Verbal orders given to [facility] with [employer]. Nothing further needed.

## 2018-08-03 NOTE — Telephone Encounter (Signed)
OK please send order. Thanks.

## 2018-08-17 ENCOUNTER — Telehealth: Payer: Self-pay | Admitting: Pulmonary Disease

## 2018-08-17 ENCOUNTER — Ambulatory Visit (INDEPENDENT_AMBULATORY_CARE_PROVIDER_SITE_OTHER): Payer: Medicare Other | Admitting: Nurse Practitioner

## 2018-08-17 ENCOUNTER — Ambulatory Visit (INDEPENDENT_AMBULATORY_CARE_PROVIDER_SITE_OTHER): Payer: Medicare Other

## 2018-08-17 ENCOUNTER — Other Ambulatory Visit: Payer: Self-pay | Admitting: Pulmonary Disease

## 2018-08-17 ENCOUNTER — Other Ambulatory Visit: Payer: Self-pay

## 2018-08-17 ENCOUNTER — Other Ambulatory Visit: Payer: Self-pay | Admitting: General Surgery

## 2018-08-17 ENCOUNTER — Encounter: Payer: Self-pay | Admitting: Nurse Practitioner

## 2018-08-17 ENCOUNTER — Ambulatory Visit (HOSPITAL_COMMUNITY)
Admission: RE | Admit: 2018-08-17 | Discharge: 2018-08-17 | Disposition: A | Discharge status: Home / Self Care | Payer: Medicare Other | Source: Ambulatory Visit | Attending: Nurse Practitioner | Admitting: Nurse Practitioner

## 2018-08-17 VITALS — BP 128/80 | HR 112 | Temp 97.4°F | Ht 70.0 in | Wt 167.2 lb

## 2018-08-17 DIAGNOSIS — R0602 Shortness of breath: Secondary | ICD-10-CM

## 2018-08-17 DIAGNOSIS — J84112 Idiopathic pulmonary fibrosis: Secondary | ICD-10-CM | POA: Diagnosis not present

## 2018-08-17 DIAGNOSIS — R7989 Other specified abnormal findings of blood chemistry: Secondary | ICD-10-CM | POA: Insufficient documentation

## 2018-08-17 LAB — BASIC METABOLIC PANEL
BUN: 14 mg/dL (ref 6–23)
CO2: 28 mEq/L (ref 19–32)
Calcium: 9.1 mg/dL (ref 8.4–10.5)
Chloride: 100 mEq/L (ref 96–112)
Creatinine, Ser: 0.97 mg/dL (ref 0.40–1.50)
GFR: 73.24 mL/min (ref >60.00)
Glucose, Bld: 108 mg/dL — ABNORMAL HIGH (ref 70–99)
Potassium: 3.8 mEq/L (ref 3.5–5.1)
Sodium: 137 mEq/L (ref 135–145)

## 2018-08-17 LAB — CBC WITH DIFFERENTIAL/PLATELET
Basophils Absolute: 0.1 10*3/uL (ref 0.0–0.1)
Basophils Relative: 0.5 % (ref 0.0–3.0)
Eosinophils Absolute: 0.1 10*3/uL (ref 0.0–0.7)
Eosinophils Relative: 0.9 % (ref 0.0–5.0)
HCT: 35.4 % — ABNORMAL LOW (ref 39.0–52.0)
Hemoglobin: 11.8 g/dL — ABNORMAL LOW (ref 13.0–17.0)
Lymphocytes Relative: 22.1 % (ref 12.0–46.0)
Lymphs Abs: 3.1 10*3/uL (ref 0.7–4.0)
MCHC: 33.3 g/dL (ref 30.0–36.0)
MCV: 92.7 fl (ref 78.0–100.0)
Monocytes Absolute: 0.9 10*3/uL (ref 0.1–1.0)
Monocytes Relative: 6.5 % (ref 3.0–12.0)
Neutro Abs: 9.7 10*3/uL — ABNORMAL HIGH (ref 1.4–7.7)
Neutrophils Relative %: 70 % (ref 43.0–77.0)
Platelets: 200 10*3/uL (ref 150.0–400.0)
RBC: 3.82 Mil/uL — ABNORMAL LOW (ref 4.22–5.81)
RDW: 15.7 % — ABNORMAL HIGH (ref 11.5–15.5)
WBC: 13.9 10*3/uL — ABNORMAL HIGH (ref 4.0–10.5)

## 2018-08-17 LAB — D-DIMER, QUANTITATIVE: D-Dimer, Quant: 8.15 mcg/mL FEU — ABNORMAL HIGH (ref <0.50)

## 2018-08-17 MED ORDER — IOHEXOL 350 MG/ML SOLN
75.0000 mL | Freq: Once | INTRAVENOUS | Status: AC | PRN
  Administered 2018-08-17: 16:00:00 75 mL via INTRAVENOUS

## 2018-08-17 MED ORDER — RIVAROXABAN (XARELTO) VTE STARTER PACK (15 & 20 MG): ORAL_TABLET | ORAL | 0 refills | Status: DC

## 2018-08-17 MED ORDER — LEVOFLOXACIN 500 MG PO TABS: 500.0000 mg | ORAL_TABLET | Freq: Every day | ORAL | 0 refills | Status: AC

## 2018-08-17 NOTE — Progress Notes (Signed)
Note: Chest x ray in office today showed increased density to left lung. Will order Levaquin and follow up in 4 weeks.

## 2018-08-17 NOTE — Telephone Encounter (Signed)
Spoke with patient's wife. She stated that the patient has developed some right side pain after his visit on 5/8. She is not sure when this started but she is concerned because he is finished with his antibiotic and not feeling any better. She also stated that they have been having a hard time keeping his O2 regulated. She requesting to have him to come in to be evaluated again.   Patient has been scheduled for an appt at 11am with Tonya. She verbalized understanding. Nothing further needed at time of call.

## 2018-08-17 NOTE — Progress Notes (Signed)
@Patient  ID: [name] [name], male    DOB: 1930/03/16, 85 y.o.   MRN: [id number]  Chief Complaint  Patient presents with   Right sided pain    Referring provider: Kelton Pillar, MD  HPI 85 year old male with IPF and chronic hypoxemic respiratory failure is followed by Dr. Lake Bells.  Tests: Chest imaging: 2017 CT scan of the chest images independently reviewed showing honeycombing and peripheral interlobular septal thickening and traction bronchiectasis worse in the left lung, interestingly worst in the left upper lobe, there is also some similar changes in the right lung but not as severe 2009 CT abdomen lung windows showing early findings of traction bronchiectasis, septal thickening and interstitial change  PFT: March 2017 forced vital capacity 2.83 L 73% predicted, DLCO 7.47 23% predicted  Labs: 2017 ANA negative, CCP negative, rheumatoid factor greater than 600   OV 07/28/18 - follow up/O2 sats dropping and right side pain  Patient presents today for right side pain with inspiration and O2 sats occasionally dropping.  He was last seen by me on 07/28/2018 and chest x-ray at that visit showed pneumonia on left side.  Patient now complains of pain with inspiration on the right side.  Patient has been participating with home PT for pulmonary rehab and has completed 4 sessions.  He states that he has not had any significant shortness of breath.  He denies recent fever.  His wife is with him during the visit today and states that his oxygen level has dropped into the upper 70s while on 3 L of oxygen.  This happened a couple times since his last visit.  She increased his oxygen level to 4 L or 5 L during these episodes and oxygen returned to above 90%.  She states that when these readings were low the patient was cold at the time.  Patient has completed a round of doxycycline since his last visit.  Wife states that he has not seem to improved with the doxycycline.Denies f/c/s, n/v/d, hemoptysis,  PND, leg swelling.     Allergies  Allergen Reactions   Sudafed [Pseudoephedrine Hcl]     Causes dizziness and nervousness   Atorvastatin Other (See Comments)    Reaction: Patient's family reports "confusion" and would prefer crestor     Immunization History  Administered Date(s) Administered   Influenza, High Dose Seasonal PF 12/03/2015, 12/23/2016, 01/31/2018   Influenza-Unspecified 12/21/2014   Pneumococcal Conjugate-13 02/28/2014   Pneumococcal Polysaccharide-23 03/23/1999    Past Medical History:  Diagnosis Date   AAA (abdominal aortic aneurysm) (HCC)    Allergy    Carotid artery stenosis    Diplopia    GERD (gastroesophageal reflux disease)    Hyperlipidemia    Memory loss    Prostate cancer ([place])    Pulmonary fibrosis ([place])    Spondylisthesis 2015   Spine   Stroke ([place]) 04/18/13   some weakness left-returning strength   TIA (transient ischemic attack)     Tobacco History: Social History   Tobacco Use  Smoking Status Former Smoker   Packs/day: 1.00   Years: 14.00   Pack years: 14.00   Types: Cigarettes   Last attempt to quit: 03/27/1965   Years since quitting: 53.4  Smokeless Tobacco Never Used  Tobacco Comment   occ alcohol   Counseling given: Not Answered Comment: occ alcohol   Outpatient Encounter Medications as of 08/17/2018  Medication Sig   acetaminophen (TYLENOL) 500 MG tablet Take 500 mg by mouth every 6 (six) hours as needed for  mild pain.   aspirin 81 MG tablet Take 81 mg by mouth daily.   azelastine (ASTELIN) 0.1 % nasal spray Place 2 sprays into both nostrils 2 (two) times daily.    cetirizine (ZYRTEC) 10 MG tablet Take 10 mg by mouth daily as needed for allergies.    dextromethorphan-guaiFENesin (MUCINEX DM) 30-600 MG 12hr tablet Take 1 tablet by mouth daily.    L-Methylfolate-B12-B6-B2 (CEREFOLIN) 08-20-48-5 MG TABS TAKE 1 TABLET BY MOUTH EVERY DAY   Multiple Vitamins-Minerals (CENTRUM SILVER PO) Take 1  tablet by mouth daily.    naproxen sodium (ANAPROX) 220 MG tablet Take 220 mg by mouth daily as needed (pain).   Omega-3 Fatty Acids (FISH OIL PO) Take 1 capsule by mouth daily.   pantoprazole (PROTONIX) 40 MG tablet TAKE 1 TABLET DAILY,30 MINUTES BEFORE DINNER.   predniSONE (DELTASONE) 10 MG tablet TAKE 1 TABLET EVERY DAY WITH BREAKFAST.   Resveratrol 100 MG CAPS Take 100 mg by mouth every evening.    rosuvastatin (CRESTOR) 5 MG tablet Take 5 mg by mouth at bedtime.   levofloxacin (LEVAQUIN) 500 MG tablet Take 1 tablet (500 mg total) by mouth daily for 10 days.   [DISCONTINUED] doxycycline (VIBRA-TABS) 100 MG tablet Take 1 tablet (100 mg total) by mouth 2 (two) times daily. (Patient not taking: Reported on 08/17/2018)   Facility-Administered Encounter Medications as of 08/17/2018  Medication   aspirin EC tablet 81 mg     Review of Systems  Review of Systems  Constitutional: Negative.  Negative for chills and fever.  HENT: Negative.   Respiratory: Negative for cough, shortness of breath and wheezing.   Cardiovascular: Negative.  Negative for chest pain, palpitations and leg swelling.  Gastrointestinal: Negative.   Allergic/Immunologic: Negative.   Neurological: Negative.   Psychiatric/Behavioral: Negative.        Physical Exam  BP 128/80 (BP Location: Left Arm, Patient Position: Sitting, Cuff Size: Normal)    Pulse (!) 112    Temp (!) 97.4 F (36.3 C)    Ht 5\' 10"  (1.778 m)    Wt 167 lb 3.2 oz (75.8 kg)    SpO2 95% Comment: on 3L of O2 continuous   BMI 23.99 kg/m   Wt Readings from Last 5 Encounters:  08/17/18 167 lb 3.2 oz (75.8 kg)  07/28/18 167 lb (75.8 kg)  05/30/18 173 lb (78.5 kg)  02/28/18 179 lb (81.2 kg)  12/07/17 178 lb 3.2 oz (80.8 kg)     Physical Exam Vitals signs and nursing note reviewed.  Constitutional:      General: He is not in acute distress.    Appearance: He is well-developed.  Cardiovascular:     Rate and Rhythm: Normal rate and  regular rhythm.  Pulmonary:     Effort: Pulmonary effort is normal. No respiratory distress.     Breath sounds: Normal breath sounds. No wheezing or rhonchi.     Comments: Crackles noted to bilateral bases.  Musculoskeletal:        General: No swelling.  Skin:    General: Skin is warm and dry.  Neurological:     Mental Status: He is alert and oriented to person, place, and time.     Imaging: Dg Chest 2 View  Result Date: 08/17/2018 CLINICAL DATA:  Shortness of breath. EXAM: CHEST - 2 VIEW COMPARISON:  Chest x-ray dated Jul 28, 2018. FINDINGS: Stable cardiomediastinal silhouette. Atherosclerotic calcification of the aortic arch. Normal pulmonary vascularity. Pulmonary fibrosis again noted with stable increased density at  the left lung base. No pleural effusion or pneumothorax. No acute osseous abnormality. IMPRESSION: 1. Unchanged pulmonary fibrosis and increased density at the left lung base. Electronically Signed   By: Titus Dubin M.D.   On: 08/17/2018 12:05   Dg Chest 2 View  Result Date: 07/28/2018 CLINICAL DATA:  Shortness of breath EXAM: CHEST - 2 VIEW COMPARISON:  10/26/2017 FINDINGS: Findings of pulmonary fibrosis are again noted. There is slight increased density involving the left lower lobe. Left-sided volume loss is again noted. The cardiac silhouette is unremarkable but is suboptimally evaluated. No pneumothorax. Pleuroparenchymal scarring is noted at the lung apices. An aortic endograft is partially visualized within the abdomen. IMPRESSION: Persistent pulmonary fibrosis with interval increase in density throughout the left lower lobe. A superimposed infectious process is not excluded. Electronically Signed   By: Constance Holster M.D.   On: 07/28/2018 15:24     Assessment & Plan:   IPF (idiopathic pulmonary fibrosis) ([place]) Patient presents today for right side pain with inspiration and O2 sats occasionally dropping.  He was last seen by me on 07/28/2018 and chest x-ray at  that visit showed pneumonia on left side.  Patient now complains of pain with inspiration on the right side.  Patient has been participating with home PT for pulmonary rehab and has completed 4 sessions.  He states that he has not had any significant shortness of breath.  He denies recent fever.  His wife is with him during the visit today and states that his oxygen level has dropped into the upper 70s while on 3 L of oxygen.  This happened a couple times since his last visit.  She increased his oxygen level to 4 L or 5 L during these episodes and oxygen returned to above 90%.  She states that when these readings were low the patient was cold at the time.  Patient has completed a round of doxycycline since his last visit.  Wife states that he has not seem to improved with the doxycycline.  Patient Instructions  Continue prednisone 10 mg daily no matter how you feel  Chronic respiratory failure with hypoxemia:  Keep using 3 L of oxygen continuously at rest and use 5L of oxygen while walking to keep sats above 89%  Continue Pulmonary rehab/ physical therapy Will check chest x ray and call with results Will check labs and call with results  Please practice good hand hygiene Stay physically active Avoid sick individuals  Follow up: Follow up with Dr. Lake Bells in 3 months or sooner if needed        Fenton Foy, NP 08/17/2018

## 2018-08-17 NOTE — Addendum Note (Signed)
Addended by: Nena Polio on: 08/17/2018 03:04 PM   Modules accepted: Orders

## 2018-08-17 NOTE — Assessment & Plan Note (Addendum)
Patient presents today for right side pain with inspiration and O2 sats occasionally dropping.  He was last seen by me on 07/28/2018 and chest x-ray at that visit showed pneumonia on left side.  Patient now complains of pain with inspiration on the right side.  Patient has been participating with home PT for pulmonary rehab and has completed 4 sessions.  He states that he has not had any significant shortness of breath.  He denies recent fever.  His wife is with him during the visit today and states that his oxygen level has dropped into the upper 70s while on 3 L of oxygen.  This happened a couple times since his last visit.  She increased his oxygen level to 4 L or 5 L during these episodes and oxygen returned to above 90%.  She states that when these readings were low the patient was cold at the time.  Patient has completed a round of doxycycline since his last visit.  Wife states that he has not seem to improved with the doxycycline.  Patient Instructions  Continue prednisone 10 mg daily no matter how you feel  Chronic respiratory failure with hypoxemia:  Keep using 3 L of oxygen continuously at rest and use 5L of oxygen while walking to keep sats above 89%  Continue Pulmonary rehab/ physical therapy Will check chest x ray and call with results Will check labs and call with results  Please practice good hand hygiene Stay physically active Avoid sick individuals  Follow up: Follow up with Dr. Lake Bells in 3 months or sooner if needed

## 2018-08-17 NOTE — Progress Notes (Signed)
Received call from radiologist regarding CT which shows subsegmental PE, significant progression of ILD and consolidation, possible PNA Levaquin was already called in today from office.  I have sent in a prescription for xarelto.  Called and discussed with wife. Reviewed CT scan and clinical status. Pt is stable and does not need ED eval at present.   Marshell Garfinkel MD [facility] 08/17/2018, 6:00 PM

## 2018-08-17 NOTE — Patient Instructions (Addendum)
Continue prednisone 10 mg daily no matter how you feel  Chronic respiratory failure with hypoxemia:  Keep using 3 L of oxygen continuously at rest and use 5L of oxygen while walking to keep sats above 89%  Continue Pulmonary rehab/ physical therapy Will check chest x ray and call with results Will check labs and call with results  Please practice good hand hygiene Stay physically active Avoid sick individuals  Follow up: Follow up with Dr. Lake Bells in 3 months or sooner if needed

## 2018-08-18 ENCOUNTER — Telehealth: Payer: Self-pay | Admitting: Pulmonary Disease

## 2018-08-20 NOTE — Progress Notes (Signed)
Reviewed, noted that patient later in the evening was diagnosed with pulmonary emboli and started on Xarelto.  Would have him follow up week of 6/1.

## 2018-08-21 NOTE — Progress Notes (Signed)
Called and spoke with the patient's wife and advised of the requested follow up. She agreed for him to be seen on 6/4//20 at 11:30. Nothing further needed at this time.

## 2018-08-21 NOTE — Telephone Encounter (Signed)
Instructions  Return in about 4 weeks (around 09/14/2018) for follow up.  Continue prednisone 10 mg daily no matter how you feel  Chronic respiratory failure with hypoxemia:  Keep using 3 L of oxygen continuously at restand use5Lof oxygenwhile walkingto keep sats above 89%  Continue Pulmonary rehab/ physical therapy Will check chest x ray and call with results Will check labs and call with results  Please practice good hand hygiene Stay physically active Avoid sick individuals  Follow up: Follow up with Dr. Lake Bells in 3 months or sooner if needed     [facility] and spoke Jalene Mullet and stated to her per last OV 5/28 [place] said for pt to continue PT. [place] expressed understanding. Nothing further needed.

## 2018-08-24 ENCOUNTER — Ambulatory Visit: Payer: Medicare Other | Admitting: Nurse Practitioner

## 2018-08-24 ENCOUNTER — Other Ambulatory Visit: Payer: Self-pay | Admitting: General Surgery

## 2018-08-24 ENCOUNTER — Encounter: Payer: Self-pay | Admitting: Nurse Practitioner

## 2018-08-24 ENCOUNTER — Other Ambulatory Visit: Payer: Self-pay

## 2018-08-24 VITALS — BP 118/68 | HR 75 | Temp 97.7°F | Ht 70.0 in | Wt 164.8 lb

## 2018-08-24 DIAGNOSIS — J84112 Idiopathic pulmonary fibrosis: Secondary | ICD-10-CM

## 2018-08-24 DIAGNOSIS — J9611 Chronic respiratory failure with hypoxia: Secondary | ICD-10-CM

## 2018-08-24 DIAGNOSIS — J181 Lobar pneumonia, unspecified organism: Secondary | ICD-10-CM | POA: Insufficient documentation

## 2018-08-24 DIAGNOSIS — I2694 Multiple subsegmental pulmonary emboli without acute cor pulmonale: Secondary | ICD-10-CM | POA: Insufficient documentation

## 2018-08-24 NOTE — Assessment & Plan Note (Signed)
Clinically stable.  CTA did show progressive worsening of IPF.  Patient is working with PT at home for pulmonary rehab.  He is compliant with prednisone daily.  He does not complain of any significant shortness of breath.  He said he feels much improved today.  Patient Instructions  Pneumonia: Please complete entire course of Levaquin  PE: Continue Xarelto as directed  IPF: CTA did show progression: Continue prednisone 10 mg daily no matter how you feel  Chronic respiratory failure with hypoxemia: Keep using 3 L of oxygen continuously at restand use5Lof oxygenwhile walkingto keep sats above 89%  Continue Pulmonary rehab/ physical therapy  Please practice good hand hygiene Stay physically active Avoid sick individuals  Follow up: Follow up in 4 weeks with Dr. Lake Bells or Kenney Houseman - (x ray already scheduled) sooner if needed

## 2018-08-24 NOTE — Progress Notes (Signed)
@Patient  ID: [name] [name], male    DOB: 01-09-1930, 85 y.o.   MRN: [id number]  Chief Complaint  Patient presents with   Follow-up    IPF    Referring provider: Kelton Pillar, MD  HPI 85 year old male with IPF and chronic hypoxemic respiratory failure is followed by Dr. Lake Bells.  Tests: Chest imaging: CTA 08/17/18 - Small bilateral segmental and subsegmental pulmonary emboli as detailed above. The overall burden is low. There is no CT evidence of right heart strain. Severe interstitial lung disease with significant progression since 2017. There are new focal areas of consolidation as detailed above which may represent a superimposed infectious process. 2017 CT scan of the chest images independently reviewed showing honeycombing and peripheral interlobular septal thickening and traction bronchiectasis worse in the left lung, interestingly worst in the left upper lobe, there is also some similar changes in the right lung but not as severe 2009 CT abdomen lung windows showing early findings of traction bronchiectasis, septal thickening and interstitial change  PFT: March 2017 forced vital capacity 2.83 L 73% predicted, DLCO 7.47 23% predicted  Labs: 2017 ANA negative, CCP negative, rheumatoid factor greater than 600  OV 08/24/18 - Follow up Patient presents today for follow-up visit for recent PE.  Patient was last seen by me on 08/17/2018 and was ordered a CTA which showed bilateral PE.  Patient was started on Xarelto.  CTA also showed progression with ILD and possible pneumonia.  Patient is on daily prednisone.  Patient has already been prescribed Levaquin per results of last chest x-ray.  Patient is much improved today.  Wife reports that his O2 sats have been stable.  Patient is on oxygen continuous.  Patient denies any significant shortness of breath.  He states that the pain in his right side has resolved.  Patient does have physical therapy coming out to the house. Denies f/c/s,  n/v/d, hemoptysis, PND, leg swelling.      Allergies  Allergen Reactions   Sudafed [Pseudoephedrine Hcl]     Causes dizziness and nervousness   Atorvastatin Other (See Comments)    Reaction: Patient's family reports "confusion" and would prefer crestor     Immunization History  Administered Date(s) Administered   Influenza, High Dose Seasonal PF 12/03/2015, 12/23/2016, 01/31/2018   Influenza-Unspecified 12/21/2014   Pneumococcal Conjugate-13 02/28/2014   Pneumococcal Polysaccharide-23 03/23/1999    Past Medical History:  Diagnosis Date   AAA (abdominal aortic aneurysm) (HCC)    Allergy    Carotid artery stenosis    Diplopia    GERD (gastroesophageal reflux disease)    Hyperlipidemia    Memory loss    Prostate cancer ([place])    Pulmonary fibrosis ([place])    Spondylisthesis 2015   Spine   Stroke ([place]) 04/18/13   some weakness left-returning strength   TIA (transient ischemic attack)     Tobacco History: Social History   Tobacco Use  Smoking Status Former Smoker   Packs/day: 1.00   Years: 14.00   Pack years: 14.00   Types: Cigarettes   Last attempt to quit: 03/27/1965   Years since quitting: 53.4  Smokeless Tobacco Never Used  Tobacco Comment   occ alcohol   Counseling given: Not Answered Comment: occ alcohol   Outpatient Encounter Medications as of 08/24/2018  Medication Sig   acetaminophen (TYLENOL) 500 MG tablet Take 500 mg by mouth every 6 (six) hours as needed for mild pain.   azelastine (ASTELIN) 0.1 % nasal spray Place 2 sprays into both nostrils  2 (two) times daily.    cetirizine (ZYRTEC) 10 MG tablet Take 10 mg by mouth daily as needed for allergies.    dextromethorphan-guaiFENesin (MUCINEX DM) 30-600 MG 12hr tablet Take 1 tablet by mouth 2 (two) times daily.    L-Methylfolate-B12-B6-B2 (CEREFOLIN) 08-20-48-5 MG TABS TAKE 1 TABLET BY MOUTH EVERY DAY   levofloxacin (LEVAQUIN) 500 MG tablet Take 1 tablet (500 mg total) by mouth  daily for 10 days.   Multiple Vitamins-Minerals (CENTRUM SILVER PO) Take 1 tablet by mouth daily.    naproxen sodium (ANAPROX) 220 MG tablet Take 220 mg by mouth daily as needed (pain).   Omega-3 Fatty Acids (FISH OIL PO) Take 1 capsule by mouth daily.   pantoprazole (PROTONIX) 40 MG tablet TAKE 1 TABLET DAILY,30 MINUTES BEFORE DINNER.   predniSONE (DELTASONE) 10 MG tablet TAKE 1 TABLET EVERY DAY WITH BREAKFAST.   Resveratrol 100 MG CAPS Take 100 mg by mouth every evening.    Rivaroxaban 15 & 20 MG TBPK Take as directed on package: Start with one 15mg  tablet by mouth twice a day with food. On Day 22, switch to one 20mg  tablet once a day with food.   rosuvastatin (CRESTOR) 5 MG tablet Take 5 mg by mouth at bedtime.   [DISCONTINUED] aspirin 81 MG tablet Take 81 mg by mouth daily.   Facility-Administered Encounter Medications as of 08/24/2018  Medication   aspirin EC tablet 81 mg     Review of Systems  Review of Systems  Constitutional: Negative.   HENT: Negative.   Respiratory: Negative for cough, shortness of breath and wheezing.   Cardiovascular: Negative.  Negative for chest pain, palpitations and leg swelling.  Gastrointestinal: Negative.   Allergic/Immunologic: Negative.   Neurological: Negative.   Psychiatric/Behavioral: Negative.        Physical Exam  BP 118/68 (BP Location: Right Arm, Patient Position: Sitting, Cuff Size: Normal)    Pulse 75    Temp 97.7 F (36.5 C)    Ht 5\' 10"  (1.778 m)    Wt 164 lb 12.8 oz (74.8 kg)    SpO2 100% Comment: on 4L O2 continuous   BMI 23.65 kg/m   Wt Readings from Last 5 Encounters:  08/24/18 164 lb 12.8 oz (74.8 kg)  08/17/18 167 lb 3.2 oz (75.8 kg)  07/28/18 167 lb (75.8 kg)  05/30/18 173 lb (78.5 kg)  02/28/18 179 lb (81.2 kg)     Physical Exam Vitals signs and nursing note reviewed.  Constitutional:      General: He is not in acute distress.    Appearance: He is well-developed.  Cardiovascular:     Rate and Rhythm:  Normal rate and regular rhythm.  Pulmonary:     Effort: Pulmonary effort is normal. No respiratory distress.     Breath sounds: Normal breath sounds. No wheezing or rhonchi.     Comments: Crackles noted to bilateral bases.   Musculoskeletal:        General: No swelling.  Skin:    General: Skin is warm and dry.  Neurological:     Mental Status: He is alert and oriented to person, place, and time.     Imaging: Dg Chest 2 View  Result Date: 08/17/2018 CLINICAL DATA:  Shortness of breath. EXAM: CHEST - 2 VIEW COMPARISON:  Chest x-ray dated Jul 28, 2018. FINDINGS: Stable cardiomediastinal silhouette. Atherosclerotic calcification of the aortic arch. Normal pulmonary vascularity. Pulmonary fibrosis again noted with stable increased density at the left lung base. No pleural effusion  or pneumothorax. No acute osseous abnormality. IMPRESSION: 1. Unchanged pulmonary fibrosis and increased density at the left lung base. Electronically Signed   By: Titus Dubin M.D.   On: 08/17/2018 12:05   Dg Chest 2 View  Result Date: 07/28/2018 CLINICAL DATA:  Shortness of breath EXAM: CHEST - 2 VIEW COMPARISON:  10/26/2017 FINDINGS: Findings of pulmonary fibrosis are again noted. There is slight increased density involving the left lower lobe. Left-sided volume loss is again noted. The cardiac silhouette is unremarkable but is suboptimally evaluated. No pneumothorax. Pleuroparenchymal scarring is noted at the lung apices. An aortic endograft is partially visualized within the abdomen. IMPRESSION: Persistent pulmonary fibrosis with interval increase in density throughout the left lower lobe. A superimposed infectious process is not excluded. Electronically Signed   By: Constance Holster M.D.   On: 07/28/2018 15:24   Ct Angio Chest W/cm &/or Wo Cm  Result Date: 08/17/2018 CLINICAL DATA:  Elevated D-dimer with shortness of breath. EXAM: CT ANGIOGRAPHY CHEST WITH CONTRAST TECHNIQUE: Multidetector CT imaging of the  chest was performed using the standard protocol during bolus administration of intravenous contrast. Multiplanar CT image reconstructions and MIPs were obtained to evaluate the vascular anatomy. CONTRAST:  11mL OMNIPAQUE IOHEXOL 350 MG/ML SOLN COMPARISON:  CT dated 07/01/2015 FINDINGS: Cardiovascular: Atherosclerotic changes are noted of the thoracic aorta. Coronary artery calcifications are noted. An acute PE is noted involving a segmental branch on the left (axial series 6, image 186). There are additional small segmental and subsegmental pulmonary emboli involving the right lower lobe. The heart size is mildly enlarged. There is no thoracic aortic aneurysm. There is no CT evidence of right heart strain. Mediastinum/Nodes: There are prominent but subcentimeter mediastinal and hilar lymph nodes. There is no axillary adenopathy. No significant supraclavicular adenopathy. Lungs/Pleura: There has been significant interval progression of the patient's previously demonstrated interstitial lung disease. There are focal areas of consolidation, for example within the lingula and posterior right upper lobe which may represent a superimposed infectious process. There is no significant pleural effusion. There is no pneumothorax. The trachea is unremarkable. Upper Abdomen: No acute abnormality. Musculoskeletal: No chest wall abnormality. No acute or significant osseous findings. Review of the MIP images confirms the above findings. IMPRESSION: 1. Small bilateral segmental and subsegmental pulmonary emboli as detailed above. The overall burden is low. There is no CT evidence of right heart strain. 2. Severe interstitial lung disease with significant progression since 2017. There are new focal areas of consolidation as detailed above which may represent a superimposed infectious process. These results were called by telephone at the time of interpretation on 08/17/2018 at 5:41 pm to Dr. Concepcion Living , who verbally acknowledged these  results. Electronically Signed   By: Constance Holster M.D.   On: 08/17/2018 17:41     Assessment & Plan:   Multiple subsegmental pulmonary emboli without acute cor pulmonale Patient is stable. Much improved since last visit. Was started on Xarelto. Tolerating well.   Patient Instructions  Pneumonia: Please complete entire course of Levaquin  PE: Continue Xarelto as directed  IPF: CTA did show progression: Continue prednisone 10 mg daily no matter how you feel  Chronic respiratory failure with hypoxemia: Keep using 3 L of oxygen continuously at restand use5Lof oxygenwhile walkingto keep sats above 89%  Continue Pulmonary rehab/ physical therapy  Please practice good hand hygiene Stay physically active Avoid sick individuals  Follow up: Follow up in 4 weeks with Dr. Lake Bells or Kenney Houseman - (x ray already scheduled) sooner if  needed    IPF (idiopathic pulmonary fibrosis) (HCC) Clinically stable.  CTA did show progressive worsening of IPF.  Patient is working with PT at home for pulmonary rehab.  He is compliant with prednisone daily.  He does not complain of any significant shortness of breath.  He said he feels much improved today.  Patient Instructions  Pneumonia: Please complete entire course of Levaquin  PE: Continue Xarelto as directed  IPF: CTA did show progression: Continue prednisone 10 mg daily no matter how you feel  Chronic respiratory failure with hypoxemia: Keep using 3 L of oxygen continuously at restand use5Lof oxygenwhile walkingto keep sats above 89%  Continue Pulmonary rehab/ physical therapy  Please practice good hand hygiene Stay physically active Avoid sick individuals  Follow up: Follow up in 4 weeks with Dr. Lake Bells or Kenney Houseman - (x ray already scheduled) sooner if needed    Chronic hypoxemic respiratory failure ([place]) Keep using 3 L of oxygen continuously at restand use5Lof oxygenwhile walkingto keep sats above  89%  Continue Pulmonary rehab/ physical therapy     Fenton Foy, NP 08/24/2018

## 2018-08-24 NOTE — Assessment & Plan Note (Signed)
Patient is stable. Much improved since last visit. Was started on Xarelto. Tolerating well.   Patient Instructions  Pneumonia: Please complete entire course of Levaquin  PE: Continue Xarelto as directed  IPF: CTA did show progression: Continue prednisone 10 mg daily no matter how you feel  Chronic respiratory failure with hypoxemia: Keep using 3 L of oxygen continuously at restand use5Lof oxygenwhile walkingto keep sats above 89%  Continue Pulmonary rehab/ physical therapy  Please practice good hand hygiene Stay physically active Avoid sick individuals  Follow up: Follow up in 4 weeks with Dr. Lake Bells or Kenney Houseman - (x ray already scheduled) sooner if needed

## 2018-08-24 NOTE — Patient Instructions (Addendum)
Pneumonia: Please complete entire course of Levaquin  PE: Continue Xarelto as directed  IPF: CTA did show progression: Continue prednisone 10 mg daily no matter how you feel  Chronic respiratory failure with hypoxemia: Keep using 3 L of oxygen continuously at restand use5Lof oxygenwhile walkingto keep sats above 89%  Continue Pulmonary rehab/ physical therapy  Please practice good hand hygiene Stay physically active Avoid sick individuals  Follow up: Follow up in 4 weeks with Dr. Lake Bells or Kenney Houseman - (x ray already scheduled) sooner if needed

## 2018-08-24 NOTE — Assessment & Plan Note (Signed)
Keep using 3 L of oxygen continuously at restand use5Lof oxygenwhile walkingto keep sats above 89%  Continue Pulmonary rehab/ physical therapy

## 2018-08-26 NOTE — Progress Notes (Signed)
Reviewed, agree 

## 2018-08-28 ENCOUNTER — Ambulatory Visit: Payer: Medicare Other | Admitting: Nurse Practitioner

## 2018-09-06 ENCOUNTER — Telehealth: Payer: Self-pay | Admitting: Pulmonary Disease

## 2018-09-06 NOTE — Telephone Encounter (Signed)
Will route to [place] as she was the last one to see patient on 08/24/18.

## 2018-09-11 ENCOUNTER — Telehealth: Payer: Self-pay | Admitting: Nurse Practitioner

## 2018-09-11 MED ORDER — RIVAROXABAN 20 MG PO TABS: 20.0000 mg | ORAL_TABLET | Freq: Every day | ORAL | 3 refills | Status: DC

## 2018-09-11 NOTE — Telephone Encounter (Signed)
Spoke with pt's wife and advised her that the medication was sent to [facility]. She understood and nothing further is needed.

## 2018-09-11 NOTE — Telephone Encounter (Signed)
  Spoke with Docia Barrier and she would like a refill of the Xarelto. He received the starter pack but wants a refill. Do we refill for the 20mg ? TN please advise.      Patient Instructions by Fenton Foy, NP at 08/24/2018 11:30 AM Author: Fenton Foy, NP Author Type: Nurse Practitioner Filed: 08/24/2018 12:01 PM  Note Status: Addendum Cosign: Cosign Not Required Encounter Date: 08/24/2018  Editor: Fenton Foy, NP (Nurse Practitioner)  Prior Versions: 1. Fenton Foy, NP (Nurse Practitioner) at 08/24/2018 12:00 PM - Addendum   2. Fenton Foy, NP (Nurse Practitioner) at 08/24/2018 11:59 AM - Addendum   3. Fenton Foy, NP (Nurse Practitioner) at 08/24/2018 11:58 AM - Signed    Pneumonia: Please complete entire course of Levaquin  PE: Continue Xarelto as directed  IPF: CTA did show progression: Continue prednisone 10 mg daily no matter how you feel  Chronic respiratory failure with hypoxemia: Keep using 3 L of oxygen continuously at restand use5Lof oxygenwhile walkingto keep sats above 89%  ContinuePulmonary rehab/ physical therapy  Please practice good hand hygiene Stay physically active Avoid sick individuals  Follow up: Follow up in 4 weeks with Dr. Lake Bells or Kenney Houseman - (x ray already scheduled) sooner if needed

## 2018-09-11 NOTE — Telephone Encounter (Signed)
Yes. It should have been 15 mg PO BID x 21 days, then 20 mg PO daily. If it has been more than 21 days since he started, then he should now be on 20 mg daily. Thanks. Please order.

## 2018-09-19 ENCOUNTER — Other Ambulatory Visit: Payer: Self-pay | Admitting: Pulmonary Disease

## 2018-09-20 ENCOUNTER — Other Ambulatory Visit: Payer: Self-pay

## 2018-09-20 ENCOUNTER — Ambulatory Visit: Payer: Medicare Other | Admitting: Neurology

## 2018-09-20 ENCOUNTER — Encounter: Payer: Self-pay | Admitting: Neurology

## 2018-09-20 VITALS — BP 120/73 | HR 94 | Temp 98.4°F | Wt 167.6 lb

## 2018-09-20 DIAGNOSIS — G3184 Mild cognitive impairment, so stated: Secondary | ICD-10-CM

## 2018-09-20 DIAGNOSIS — I6521 Occlusion and stenosis of right carotid artery: Secondary | ICD-10-CM | POA: Diagnosis not present

## 2018-09-20 NOTE — Patient Instructions (Signed)
I had a long discussion with the patient and his wife regarding his remote stroke and mild cognitive impairment and he appears to be stable from both standpoint.  Continue Xarelto for stroke prevention given history of recent pulmonary emboli and Crestor for hyperlipidemia with LDL goal below 70 mg percent and systolic blood pressure goal below 130/90.  Continue participation in mentally challenging activities like solving crossword puzzles, playing bridge and sudoku and takes Cerefolin NAC and provision for his mild cognitive impairment.  We also discussed memory compensation strategies.  He may return for follow-up in the future only as necessary and no routine scheduled follow-up appointment was made. Memory Compensation Strategies  1. Use "WARM" strategy.  W= write it down  A= associate it  R= repeat it  M= make a mental note  2.   You can keep a Social worker.  Use a 3-ring notebook with sections for the following: calendar, important names and phone numbers,  medications, doctors' names/phone numbers, lists/reminders, and a section to journal what you did  each day.   3.    Use a calendar to write appointments down.  4.    Write yourself a schedule for the day.  This can be placed on the calendar or in a separate section of the Memory Notebook.  Keeping a  regular schedule can help memory.  5.    Use medication organizer with sections for each day or morning/evening pills.  You may need help loading it  6.    Keep a basket, or pegboard by the door.  Place items that you need to take out with you in the basket or on the pegboard.  You may also want to  include a message board for reminders.  7.    Use sticky notes.  Place sticky notes with reminders in a place where the task is performed.  For example: " turn off the  stove" placed by the stove, "lock the door" placed on the door at eye level, " take your medications" on  the bathroom mirror or by the place where you normally take your  medications.  8.    Use alarms/timers.  Use while cooking to remind yourself to check on food or as a reminder to take your medicine, or as a  reminder to make a call, or as a reminder to perform another task, etc.

## 2018-09-20 NOTE — Progress Notes (Signed)
[facility]  PATIENT: [name] [name] DOB: 1929/03/25   REASON FOR VISIT: Follow-up for history of stroke, mild cognitive impairment  HISTORY FROM: Patient and wife   HISTORY OF PRESENT ILLNESS: 85 year Caucasian male seen for first office followup visit from hospital consideration for stroke in January 2015. He presented with difficulty picking things up with his left hand as well as grip weakness while playing golf. He also subsequently diescribed, headache and some speech and language difficulties and double vision. His symptoms improved soon after arrival and hence he was not considered for thrombolysis. CT scan of the head showed an area of low attenuation in the right anterior limb internal capsule suggestive of a subacute lacunar infarct which was subsequently confirmed on MRI . 2D echo showed ejection fraction of 45-50% with diffuse hypokinesis. Carotid Doppler showed progressive 80-99% right ICA stenosis and 1-39% left ICA stenosis at the previous surgical site. Vascular surgery was consulted . Patient was started on aspirin. Patient was scheduled for and underwent elective right carotid endarterectomy by Dr Kellie [name] and the procedure went well. Patient was placed on Plavix by me but after the surgery was changed to aspirin. He states his blood pressure is fine and is 103/65 today and it usually runs in the 120s at home. The patient's wife has noted that he has subacute decline in his memory as well as multitasking and cognitive abilities. She is concerned about this and would like evaluation. The patient himself denies this. He has not had an evaluation for these complaints before.   UPDATE 08/27/13 (LL): Since last visit, patient has continued taking Cerefolin NAC for memory. He does not notice any change in his memory, but states he knows his short term memory is not as good as it used to be. He is tolerating Plavix well with no signs of significant bleeding or bruising. His  blood pressure is well controlled, it is 125/79 in the office today. He states he stays active, plays golf a couple days a week. Update 05/10/2014 PShe returns for follow-up accompanied by his wife. He states his memory difficulties are unchanged. He in fact is doing well and is still able to handle finances for the home and business. He is taking fish oil as well as Cerefolin everyday. The patient also remains on Plavix which is tolerating well without bleeding or bruising. He had lipid profile checked in December 2015 by primary physician which was fine. He also had follow-up carotid ultrasound done last week by [name] [name] which was okay as well. He exercises regularly and plays golf as well. He has no new complaints. He has not had any recurrent stroke or TIA symptoms. Update 11/05/2014:PS He returns for follow-up after last visit 6 months ago. He is accompanied by his wife. He states his short-term memory difficulties continue. He has occasional fecal times in judgment and multitasking. However overall his symptoms seem stable. He continues to take Cerefolin daily. He does participate in doing sudoku several times a week. He does play golf twice a week. He did have a follow-up carotid ultrasound checked in February and [name] [name] office and it was fine. He remains on Plavix which is tolerating well without bleeding or bruising. He has not had any recurrent stroke or TIA symptoms. He remains on Crestor without any myalgias or arthralgias. His blood pressure is elevated today in office at 150/93 but he blames this on white coat hypertension. He did have lipid profile checked 2 months ago by  [name] [name] and it was fine.  UPDATE 05/06/2015 CMMr. [place], 85 year old male returns for follow-up. He is accompanied by his wife [name] [name]. He has not had further stroke or TIA symptoms since last seen. He is currently on Plavix with minimal bruising. His memory has not progressed since last seen. He continues to play golf  several times a week and participates in card games, and sudoku. He remains on Cerefolin. His lipids are followed by primary care and he remains on Crestor without side effects. Carotid Dopplers followed yearly by [name] [name]. He returns for reevaluation UPDATE 08/15/2017CM [name] [name], 85 year old returns for follow-up with his wife. He has a history of stroke in January 2015. He has not had further stroke or TIA symptoms since that time he is currently on Plavix with minimal bruising. His memory is stable, MMSE 27/30. He continues to play golf twice a week and participate in other activities to stimulate his mind. He remains on Cerefolin. He was recently diagnosed with pulmonary fibrosis and is using oxygen at night. He remains on Crestor and lipids are followed by primary care [name] [name].  Carotid Doppler is followed by [facility]. Doppler is due to February 2018. He returns for reevaluation Update 2/13/2018PS : He returns for follow-up after last visit 6 months ago. He is accompanied by his wife. He states his doing well. He has had no recurrent stroke or TIA symptoms now for 2 years. He is on Plavix but is having significant skin bruising. He in fact is currently having 2 areas that he has Band-Aids because of the bruising. Patient does have regular follow up with vascular surgeon for his aortic aneurysm as well as carotid surgery surveillance. He states her blood pressure is under good control and today it is 130/83. He recently had lipid profile checked on January 2 with LDL cholesterol was 63, HDL 77, triglycerides 116 and total cholesterol 163 mg percent. He has been diagnosed with pulmonary fibrosis and started on home oxygen at night only. He states he feels and in the day. Vistaril memory difficulties about the same. He remains on Cerefolin NAC and Reservatrol. He is mostly independent in history driving. He has short-term memory difficulties mostly. He has no other new complaints Update 12/01/2016 CM: He  returns for follow-up after last of that 6 months ago. He is accompanied by his wife. They feel his memory difficulties about unchanged. She still has mild short-term memory difficulties. He does keep himself busy in mentally challenging activities like doing sudoku and playing computer games. He is on home oxygen with pulmonary fibrosis which limits his physical activity. He has noticed diminished bruising after changing from Plavix to aspirin. He is had no recurrent stroke or TIA symptoms. He has no new complaints today. The patient has his wife has decided not to participate and early dementia studies at the present time hence they do not have the time UPDATE 3/12/2019CM Mr. [place], 85 year old male returns for follow-up with history of mild cognitive impairment and stroke event in 2015.  He is supposed to be on aspirin for secondary stroke prevention however he has not been taking this.  He has not had further stroke or TIA symptoms he had significant bruising on Plavix.  He remains on Crestor without myalgias.  Blood pressure in the office today 120/65.  His mild cognitive impairment is stable.  He also has a history of pulmonary fibrosis which limits his physical activity.  He is on oxygen therapy.  He tries to remain  physically active but tires easily.  He still does computer games etc.  He returns for reevaluation UPDATE 9/18/2019CM Mr. [place], 85 year old male returns for follow-up with a history of stroke event in 2015 and mild cognitive impairment.  He remains on aspirin for secondary stroke prevention without recurrent stroke or TIA symptoms he has minimal bruising and no bleeding.  Blood pressure in the office today 116 at 72.  He remains on Crestor without myalgias recent LDL 62 according to the wife.  He has a history of pulmonary fibrosis and uses oxygen as needed.  He says he still plays golf at least once a week.  He tries to stay as active as possible.  His mild cognitive impairment is stable.  He  continues to do exercises to stimulate his memory.  He returns for reevaluation Update 09/20/2018 : He returns for follow-up after last visit with Cecille Rubin nurse practitioner in September 2019.  He is accompanied by his wife.  He continues to do well from neurovascular standpoint and has not had a recurrent stroke since 2015.  He was switched from aspirin to Xarelto because of development of small pulmonary emboli several months ago.  He is tolerating it well with minor bruising but no bleeding episodes.  His blood pressure remains under good control and today it is 120/73.  He is been sick from pulmonary standpoint in recent months and developed pneumonia.  He was treated with course 2 different courses of antibiotics.  He remains on home oxygen due to his pulmonary fibrosis.  He does follow-up with pulmonologist Dr. Vaughan Browner.  He states his memory difficulties about unchanged.  He still has some short-term memory difficulties.  He does do some crossword puzzles and mentally challenging activities.  He does take prevagen and Cerefolin.  He remains on Crestor and had lipid profile checked in January by [name] [name] and it was satisfactory.  He has no new neurological complaints today REVIEW OF SYSTEMS: Full 14 system review of systems performed and notable only for those listed, all others are neg:   Shortness of breath, cough, difficulty breathing, memory loss and all other systems negative  ALLERGIES: Allergies  Allergen Reactions   Sudafed [Pseudoephedrine Hcl]     Causes dizziness and nervousness   Atorvastatin Other (See Comments)    Reaction: Patient's family reports "confusion" and would prefer crestor     HOME MEDICATIONS: Outpatient Medications Prior to Visit  Medication Sig Dispense Refill   acetaminophen (TYLENOL) 500 MG tablet Take 500 mg by mouth every 6 (six) hours as needed for mild pain.     azelastine (ASTELIN) 0.1 % nasal spray Place 2 sprays into both nostrils 2 (two) times  daily.   4   cetirizine (ZYRTEC) 10 MG tablet Take 10 mg by mouth daily as needed for allergies.      dextromethorphan-guaiFENesin (MUCINEX DM) 30-600 MG 12hr tablet Take 1 tablet by mouth 2 (two) times daily.      L-Methylfolate-B12-B6-B2 (CEREFOLIN) 08-20-48-5 MG TABS TAKE 1 TABLET BY MOUTH EVERY DAY 90 each 3   Multiple Vitamins-Minerals (CENTRUM SILVER PO) Take 1 tablet by mouth daily.      naproxen sodium (ANAPROX) 220 MG tablet Take 220 mg by mouth daily as needed (pain).     Omega-3 Fatty Acids (FISH OIL PO) Take 1 capsule by mouth daily.     pantoprazole (PROTONIX) 40 MG tablet TAKE 1 TABLET DAILY,30 MINUTES BEFORE DINNER. 30 tablet 3   predniSONE (DELTASONE) 10 MG tablet TAKE  1 TABLET EVERY DAY WITH BREAKFAST. 30 tablet 0   Resveratrol 100 MG CAPS Take 100 mg by mouth every evening.      rivaroxaban (XARELTO) 20 MG TABS tablet Take 1 tablet (20 mg total) by mouth daily with supper. 30 tablet 3   rosuvastatin (CRESTOR) 5 MG tablet Take 5 mg by mouth at bedtime.     Facility-Administered Medications Prior to Visit  Medication Dose Route Frequency Provider Last Rate Last Dose   aspirin EC tablet 81 mg  81 mg Oral Daily Garvin Fila, MD        PAST MEDICAL HISTORY: Past Medical History:  Diagnosis Date   AAA (abdominal aortic aneurysm) ([place])    Allergy    Carotid artery stenosis    Diplopia    GERD (gastroesophageal reflux disease)    Hyperlipidemia    Memory loss    Prostate cancer ([place])    Pulmonary fibrosis ([place])    Spondylisthesis 2015   Spine   Stroke ([place]) 04/18/13   some weakness left-returning strength   TIA (transient ischemic attack)     PAST SURGICAL HISTORY: Past Surgical History:  Procedure Laterality Date   ABDOMINAL AORTIC ENDOVASCULAR STENT GRAFT N/A 06/17/2016   Procedure: ABDOMINAL AORTIC ENDOVASCULAR STENT GRAFT;  Surgeon: Waynetta Sandy, MD;  Location: [facility] OR;  Service: Vascular;  Laterality: N/A;   CAROTID  ENDARTERECTOMY  Sept. 2008   Elective left carotid endarterectomy   CAROTID ENDARTERECTOMY Right 04/27/2013   CE   ENDARTERECTOMY Right 04/27/2013   Procedure: ENDARTERECTOMY CAROTID;  Surgeon: Mal Misty, MD;  Location: [facility] OR;  Service: Vascular;  Laterality: Right;   EYE SURGERY     Bilateral cataract    FOOT SURGERY      left Achilles Tendon Repair   seed implantation  2001   for prostate ca    FAMILY HISTORY: Family History  Problem Relation Age of Onset   Stroke Mother    Heart disease Mother        After age 4   Varicose Veins Mother    Hypertension Mother    Cancer Mother        Theadora Rama    Stroke Father    Heart attack Father    Deep vein thrombosis Father    Anuerysm Father    Heart disease Brother     SOCIAL HISTORY: Social History   Socioeconomic History   Marital status: Married    Spouse name: lee   Number of children: 2   Years of education: college   Highest education level: Not on file  Occupational History   Occupation: retired    Fish farm manager: RETIRED    Comment: [place] resource strain: Not on file   Food insecurity    Worry: Not on file    Inability: Not on Lexicographer needs    Medical: Not on file    Non-medical: Not on file  Tobacco Use   Smoking status: Former Smoker    Packs/day: 1.00    Years: 14.00    Pack years: 14.00    Types: Cigarettes    Quit date: 03/27/1965    Years since quitting: 53.5   Smokeless tobacco: Never Used   Tobacco comment: occ alcohol  Substance and Sexual Activity   Alcohol use: Yes    Alcohol/week: 1.0 standard drinks    Types: 1 Shots of liquor per week    Comment: 3-4 drinks  week bourbon   Drug use: No   Sexual activity: Not Currently  Lifestyle   Physical activity    Days per week: Not on file    Minutes per session: Not on file   Stress: Not on file  Relationships   Social connections    Talks on phone: Not on file     Gets together: Not on file    Attends religious service: Not on file    Active member of club or organization: Not on file    Attends meetings of clubs or organizations: Not on file    Relationship status: Not on file   Intimate partner violence    Fear of current or ex partner: Not on file    Emotionally abused: Not on file    Physically abused: Not on file    Forced sexual activity: Not on file  Other Topics Concern   Not on file  Social History Narrative   Not on file     PHYSICAL EXAM  Vitals:   09/20/18 1415  BP: 120/73  Pulse: 94  Temp: 98.4 F (36.9 C)  Weight: 76 kg   Body mass index is 24.05 kg/m.  Generalized: Frail elderly Caucasian male who is on home oxygen.  Minor bruises on the skin of both forearms Head: normocephalic and atraumatic,.    Neck: Supple, no carotid bruits  Cardiac: Regular rate rhythm, no murmur  Musculoskeletal: No deformity  Respiratory system bilateral scattered crackles posteriorly  Neurological examination   Mentation: Alert  Clock drawing 4/4 MMSE - Mini Mental State Exam 05/31/2017 12/01/2016 05/04/2016  Orientation to time 4 4 5   Orientation to Place 4 5 4   Registration 3 3 3   Attention/ Calculation 5 5 5   Recall 1 1 3   Language- name 2 objects 2 2 2   Language- repeat 1 1 1   Language- follow 3 step command 3 3 3   Language- read & follow direction 1 1 1   Write a sentence 1 1 1   Copy design 1 1 1   Total score 26 27 29   Follows all commands speech and language fluent.  Diminished recall 0/3 today.  Able to name only 7 animals which walk on 4 legs.  Clock drawing 4/4.  Mini-Mental status exam not done today. Cranial nerve II-XII: Pupils were equal round reactive to light extraocular movements were full, visual field were full on confrontational test. Facial sensation and strength were normal. hearing was intact to finger rubbing bilaterally. Uvula tongue midline. head turning and shoulder shrug were normal and  symmetric.Tongue protrusion into cheek strength was normal. Motor: normal bulk and tone, full strength in the BUE, BLE, Sensory: normal and symmetric to light touch,  in the upper and lower extremities Coordination: finger-nose-finger, heel-to-shin bilaterally, no dysmetria Reflexes: 1+ upper lower and symmetric plantar responses were flexor bilaterally. Gait and Station: Rising up from seated position without assistance, normal stance,  moderate stride, good arm swing, smooth turning. able to perform tiptoe, and heel walking with slight difficulty.  .  No assistive device  DIAGNOSTIC DATA (LABS, IMAGING, TESTING) - I reviewed patient records, labs, notes, testing and imaging myself where available.  Lab Results  Component Value Date   WBC 13.9 (H) 08/17/2018   HGB 11.8 (L) 08/17/2018   HCT 35.4 (L) 08/17/2018   MCV 92.7 08/17/2018   PLT 200.0 08/17/2018      Component Value Date/Time   NA 137 08/17/2018 1202   K 3.8 08/17/2018 1202   CL 100  08/17/2018 1202   CO2 28 08/17/2018 1202   GLUCOSE 108 (H) 08/17/2018 1202   BUN 14 08/17/2018 1202   CREATININE 0.97 08/17/2018 1202   CALCIUM 9.1 08/17/2018 1202   PROT 7.7 07/20/2016 1301   ALBUMIN 3.9 07/20/2016 1301   AST 30 07/20/2016 1301   ALT 26 07/20/2016 1301   ALKPHOS 69 07/20/2016 1301   BILITOT 0.6 07/20/2016 1301   GFRNONAA >60 06/18/2016 0445   GFRAA >60 06/18/2016 0445     Lab Results  Component Value Date   TSH 3.94 07/20/2016      ASSESSMENT AND PLAN 88year with right posterior frontal infarct in January 2015 secondary to symptomatic right ICA stenosis for which he underwent elective right carotid endarterectomy. Multiple vascular risk factors of carotid stenosis, hyperlipidemia, peripheral vascular disease and history of TIA. He also has pre-existing memory loss which is likely mild cognitive impairment which appears stable   PLAN: I had a long discussion with the patient and his wife regarding his remote  stroke and mild cognitive impairment and he appears to be stable from both standpoint.  Continue Xarelto for stroke prevention given history of recent pulmonary emboli and Crestor for hyperlipidemia with LDL goal below 70 mg percent and systolic blood pressure goal below 130/90.  Continue participation in mentally challenging activities like solving crossword puzzles, playing bridge and sudoku and takes Cerefolin NAC and provision for his mild cognitive impairment.  We also discussed memory compensation strategies.  Greater than 50% time during this 25-minute visit was spent on counseling and coordination of care about his mild cognitive impairment, remote stroke and answering questions he may return for follow-up in the future only as necessary and no routine scheduled follow-up appointment was made.  Antony Contras, MD [facility] [address], [place], [place] [address] [phone]  I reviewed the above note and documentation by the Nurse Practitioner and agree with the history, physical exam, assessment and plan as outlined above. I was immediately available for face-to-face consultation. Star Age, MD, PhD [facility])

## 2018-09-21 ENCOUNTER — Ambulatory Visit: Payer: Medicare Other | Admitting: Nurse Practitioner

## 2018-09-21 ENCOUNTER — Other Ambulatory Visit: Payer: Self-pay

## 2018-09-21 ENCOUNTER — Encounter: Payer: Self-pay | Admitting: Nurse Practitioner

## 2018-09-21 ENCOUNTER — Other Ambulatory Visit: Payer: Self-pay | Admitting: General Surgery

## 2018-09-21 ENCOUNTER — Ambulatory Visit (INDEPENDENT_AMBULATORY_CARE_PROVIDER_SITE_OTHER): Payer: Medicare Other

## 2018-09-21 DIAGNOSIS — R0602 Shortness of breath: Secondary | ICD-10-CM | POA: Diagnosis not present

## 2018-09-21 DIAGNOSIS — J189 Pneumonia, unspecified organism: Secondary | ICD-10-CM | POA: Diagnosis not present

## 2018-09-21 DIAGNOSIS — J9611 Chronic respiratory failure with hypoxia: Secondary | ICD-10-CM

## 2018-09-21 DIAGNOSIS — J84112 Idiopathic pulmonary fibrosis: Secondary | ICD-10-CM

## 2018-09-21 DIAGNOSIS — J181 Lobar pneumonia, unspecified organism: Secondary | ICD-10-CM | POA: Diagnosis not present

## 2018-09-21 DIAGNOSIS — R5381 Other malaise: Secondary | ICD-10-CM

## 2018-09-21 NOTE — Patient Instructions (Signed)
Pneumonia: Completed Levaquin Will check follow up chest x ray today  PE: Continue Xarelto as directed  IPF: CTA did show progression: Continue prednisone 10 mg daily no matter how you feel  Chronic respiratory failure with hypoxemia: Keep using 3 L of oxygen continuously at restand use5Lof oxygenwhile walkingto keep sats above 89%  Please practice good hand hygiene Stay physically active Avoid sick individuals  Follow up: Follow up in 3 months with Dr. Lake Bells

## 2018-09-21 NOTE — Progress Notes (Signed)
@Patient  ID: [name] [name], male    DOB: 03-Oct-1929, 85 y.o.   MRN: [id number]  Chief Complaint  Patient presents with  . Follow-up    Referring provider: Kelton Pillar, MD  HPI  85 year old male with IPF and chronic hypoxemic respiratory failure is followed by Dr. Lake Bells.  Tests:  Chest imaging: CTA 08/17/18 - Small bilateral segmental and subsegmental pulmonary emboli as detailed above. The overall burden is low. There is no CT evidence of right heart strain. Severe interstitial lung disease with significant progression since 2017. There are new focal areas of consolidation as detailed above which may represent a superimposed infectious process. 2017 CT scan of the chest images independently reviewed showing honeycombing and peripheral interlobular septal thickening and traction bronchiectasis worse in the left lung, interestingly worst in the left upper lobe, there is also some similar changes in the right lung but not as severe 2009 CT abdomen lung windows showing early findings of traction bronchiectasis, septal thickening and interstitial change  PFT: March 2017 forced vital capacity 2.83 L 73% predicted, DLCO 7.47 23% predicted  Labs: 2017 ANA negative, CCP negative, rheumatoid factor greater than 600  OV 09/21/18 - follow up Patient presents for follow-up visit today.  He was recently diagnosed with bilateral PE and pneumonia on 08/17/2018.  Patient was prescribed Levaquin and was started on Xarelto.  He has completed Levaquin and is compliant with Xarelto.  Wife states that he feels much better.  He has finished home PT.  Wife reports that his O2 sats have been stable.  Patient is on oxygen continuous.  Denies any significant shortness of breath or cough. Denies f/c/s, n/v/d, hemoptysis, PND, leg swelling.     Allergies  Allergen Reactions  . Sudafed [Pseudoephedrine Hcl]     Causes dizziness and nervousness  . Atorvastatin Other (See Comments)    Reaction: Patient's  family reports "confusion" and would prefer crestor     Immunization History  Administered Date(s) Administered  . Influenza, High Dose Seasonal PF 12/03/2015, 12/23/2016, 01/31/2018  . Influenza-Unspecified 12/21/2014  . Pneumococcal Conjugate-13 02/28/2014  . Pneumococcal Polysaccharide-23 03/23/1999    Past Medical History:  Diagnosis Date  . AAA (abdominal aortic aneurysm) ([place])   . Allergy   . Carotid artery stenosis   . Diplopia   . GERD (gastroesophageal reflux disease)   . Hyperlipidemia   . Memory loss   . Prostate cancer ([place])   . Pulmonary fibrosis ([place])   . Spondylisthesis 2015   Spine  . Stroke ([place]) 04/18/13   some weakness left-returning strength  . TIA (transient ischemic attack)     Tobacco History: Social History   Tobacco Use  Smoking Status Former Smoker  . Packs/day: 1.00  . Years: 14.00  . Pack years: 14.00  . Types: Cigarettes  . Quit date: 03/27/1965  . Years since quitting: 53.5  Smokeless Tobacco Never Used   Counseling given: Yes   Outpatient Encounter Medications as of 09/21/2018  Medication Sig  . acetaminophen (TYLENOL) 500 MG tablet Take 500 mg by mouth every 6 (six) hours as needed for mild pain.  Marland Kitchen azelastine (ASTELIN) 0.1 % nasal spray Place 2 sprays into both nostrils 2 (two) times daily.   . cetirizine (ZYRTEC) 10 MG tablet Take 10 mg by mouth daily as needed for allergies.   Marland Kitchen dextromethorphan-guaiFENesin (MUCINEX DM) 30-600 MG 12hr tablet Take 1 tablet by mouth 2 (two) times daily.   Marland Kitchen L-Methylfolate-B12-B6-B2 (CEREFOLIN) 08-20-48-5 MG TABS TAKE 1 TABLET BY MOUTH  EVERY DAY  . Multiple Vitamins-Minerals (CENTRUM SILVER PO) Take 1 tablet by mouth daily.   . naproxen sodium (ANAPROX) 220 MG tablet Take 220 mg by mouth daily as needed (pain).  . Omega-3 Fatty Acids (FISH OIL PO) Take 1 capsule by mouth daily.  . pantoprazole (PROTONIX) 40 MG tablet TAKE 1 TABLET DAILY,30 MINUTES BEFORE DINNER.  Marland Kitchen predniSONE (DELTASONE) 10 MG tablet  TAKE 1 TABLET EVERY DAY WITH BREAKFAST. (Patient taking differently: daily. )  . Resveratrol 100 MG CAPS Take 100 mg by mouth every evening.   . rivaroxaban (XARELTO) 20 MG TABS tablet Take 1 tablet (20 mg total) by mouth daily with supper.  . rosuvastatin (CRESTOR) 5 MG tablet Take 5 mg by mouth at bedtime.   Facility-Administered Encounter Medications as of 09/21/2018  Medication  . aspirin EC tablet 81 mg     Review of Systems  Review of Systems  Constitutional: Negative.  Negative for chills and fever.  HENT: Negative.   Respiratory: Positive for cough and shortness of breath. Negative for wheezing.   Cardiovascular: Negative.  Negative for chest pain, palpitations and leg swelling.  Gastrointestinal: Negative.   Allergic/Immunologic: Negative.   Neurological: Negative.   Psychiatric/Behavioral: Negative.      Physical Exam  BP 128/72 (BP Location: Left Arm, Patient Position: Sitting, Cuff Size: Normal)   Pulse 64   Temp 98 F (36.7 C)   Ht 5\' 10"  (1.778 m)   Wt 168 lb 9.6 oz (76.5 kg)   SpO2 100% Comment: on 3L of O2 continuous  BMI 24.19 kg/m   Wt Readings from Last 5 Encounters:  09/21/18 168 lb 9.6 oz (76.5 kg)  09/20/18 167 lb 9.6 oz (76 kg)  08/24/18 164 lb 12.8 oz (74.8 kg)  08/17/18 167 lb 3.2 oz (75.8 kg)  07/28/18 167 lb (75.8 kg)     Physical Exam Vitals signs and nursing note reviewed.  Constitutional:      General: He is not in acute distress.    Appearance: He is well-developed.  Cardiovascular:     Rate and Rhythm: Normal rate and regular rhythm.  Pulmonary:     Effort: Pulmonary effort is normal. No respiratory distress.     Breath sounds: Normal breath sounds. No wheezing or rhonchi.     Comments: Crackles to bilateral bases Musculoskeletal:        General: No swelling.  Skin:    General: Skin is warm and dry.  Neurological:     Mental Status: He is alert and oriented to person, place, and time.       Assessment & Plan:   Lobar  pneumonia, unspecified organism [facility]) Patient presents for follow-up visit today.  He was recently diagnosed with bilateral PE and pneumonia on 08/17/2018.  Patient was prescribed Levaquin and was started on Xarelto.  He has completed Levaquin and is compliant with Xarelto.  Wife states that he feels much better.  He has finished home PT.  Wife reports that his O2 sats have been stable.  Patient is on oxygen continuous.  Denies any significant shortness of breath or cough.   Patient Instructions  Pneumonia: Completed Levaquin Will check follow up chest x ray today  PE: Continue Xarelto as directed  IPF: CTA did show progression: Continue prednisone 10 mg daily no matter how you feel  Chronic respiratory failure with hypoxemia: Keep using 3 L of oxygen continuously at restand use5Lof oxygenwhile walkingto keep sats above 89%  Please practice good hand hygiene Stay  physically active Avoid sick individuals  Follow up: Follow up in 3 months with Dr. Tawanna Solo, NP 09/21/2018

## 2018-09-21 NOTE — Assessment & Plan Note (Signed)
Patient presents for follow-up visit today.  He was recently diagnosed with bilateral PE and pneumonia on 08/17/2018.  Patient was prescribed Levaquin and was started on Xarelto.  He has completed Levaquin and is compliant with Xarelto.  Wife states that he feels much better.  He has finished home PT.  Wife reports that his O2 sats have been stable.  Patient is on oxygen continuous.  Denies any significant shortness of breath or cough.   Patient Instructions  Pneumonia: Completed Levaquin Will check follow up chest x ray today  PE: Continue Xarelto as directed  IPF: CTA did show progression: Continue prednisone 10 mg daily no matter how you feel  Chronic respiratory failure with hypoxemia: Keep using 3 L of oxygen continuously at restand use5Lof oxygenwhile walkingto keep sats above 89%  Please practice good hand hygiene Stay physically active Avoid sick individuals  Follow up: Follow up in 3 months with Dr. Lake Bells

## 2018-09-22 NOTE — Progress Notes (Signed)
Reviewed, agree 

## 2018-09-29 ENCOUNTER — Encounter (HOSPITAL_COMMUNITY): Payer: Medicare Other

## 2018-10-10 DIAGNOSIS — E782 Mixed hyperlipidemia: Secondary | ICD-10-CM | POA: Diagnosis not present

## 2018-10-10 DIAGNOSIS — I519 Heart disease, unspecified: Secondary | ICD-10-CM | POA: Diagnosis not present

## 2018-10-10 DIAGNOSIS — I119 Hypertensive heart disease without heart failure: Secondary | ICD-10-CM | POA: Diagnosis not present

## 2018-10-10 DIAGNOSIS — I6521 Occlusion and stenosis of right carotid artery: Secondary | ICD-10-CM | POA: Diagnosis not present

## 2018-10-11 ENCOUNTER — Other Ambulatory Visit: Payer: Self-pay

## 2018-10-11 ENCOUNTER — Ambulatory Visit (HOSPITAL_COMMUNITY)
Admission: RE | Admit: 2018-10-11 | Discharge: 2018-10-11 | Disposition: A | Discharge status: Home / Self Care | Payer: Medicare Other | Source: Ambulatory Visit | Attending: Neurology | Admitting: Neurology

## 2018-10-11 DIAGNOSIS — I6521 Occlusion and stenosis of right carotid artery: Secondary | ICD-10-CM | POA: Diagnosis not present

## 2018-10-20 ENCOUNTER — Observation Stay (HOSPITAL_COMMUNITY)
Admission: EM | Admit: 2018-10-20 | Discharge: 2018-10-21 | Disposition: A | Discharge status: Home / Self Care | Payer: Medicare Other | Attending: Internal Medicine | Admitting: Internal Medicine

## 2018-10-20 ENCOUNTER — Emergency Department (HOSPITAL_COMMUNITY): Payer: Medicare Other

## 2018-10-20 ENCOUNTER — Other Ambulatory Visit: Payer: Self-pay

## 2018-10-20 ENCOUNTER — Encounter (HOSPITAL_COMMUNITY): Payer: Self-pay | Admitting: Emergency Medicine

## 2018-10-20 DIAGNOSIS — E785 Hyperlipidemia, unspecified: Secondary | ICD-10-CM | POA: Diagnosis not present

## 2018-10-20 DIAGNOSIS — R000 Tachycardia, unspecified: Secondary | ICD-10-CM | POA: Diagnosis not present

## 2018-10-20 DIAGNOSIS — N179 Acute kidney failure, unspecified: Secondary | ICD-10-CM | POA: Insufficient documentation

## 2018-10-20 DIAGNOSIS — G3184 Mild cognitive impairment, so stated: Secondary | ICD-10-CM | POA: Diagnosis not present

## 2018-10-20 DIAGNOSIS — R509 Fever, unspecified: Secondary | ICD-10-CM | POA: Diagnosis not present

## 2018-10-20 DIAGNOSIS — Z87891 Personal history of nicotine dependence: Secondary | ICD-10-CM | POA: Diagnosis not present

## 2018-10-20 DIAGNOSIS — K219 Gastro-esophageal reflux disease without esophagitis: Secondary | ICD-10-CM | POA: Diagnosis not present

## 2018-10-20 DIAGNOSIS — I714 Abdominal aortic aneurysm, without rupture, unspecified: Secondary | ICD-10-CM | POA: Diagnosis present

## 2018-10-20 DIAGNOSIS — Z7952 Long term (current) use of systemic steroids: Secondary | ICD-10-CM | POA: Insufficient documentation

## 2018-10-20 DIAGNOSIS — I6529 Occlusion and stenosis of unspecified carotid artery: Secondary | ICD-10-CM | POA: Insufficient documentation

## 2018-10-20 DIAGNOSIS — I69354 Hemiplegia and hemiparesis following cerebral infarction affecting left non-dominant side: Secondary | ICD-10-CM | POA: Insufficient documentation

## 2018-10-20 DIAGNOSIS — Z1159 Encounter for screening for other viral diseases: Secondary | ICD-10-CM | POA: Insufficient documentation

## 2018-10-20 DIAGNOSIS — F0390 Unspecified dementia without behavioral disturbance: Secondary | ICD-10-CM | POA: Insufficient documentation

## 2018-10-20 DIAGNOSIS — J9611 Chronic respiratory failure with hypoxia: Secondary | ICD-10-CM | POA: Diagnosis not present

## 2018-10-20 DIAGNOSIS — Z8673 Personal history of transient ischemic attack (TIA), and cerebral infarction without residual deficits: Secondary | ICD-10-CM

## 2018-10-20 DIAGNOSIS — T7840XA Allergy, unspecified, initial encounter: Secondary | ICD-10-CM | POA: Insufficient documentation

## 2018-10-20 DIAGNOSIS — N390 Urinary tract infection, site not specified: Principal | ICD-10-CM | POA: Diagnosis present

## 2018-10-20 DIAGNOSIS — N3001 Acute cystitis with hematuria: Secondary | ICD-10-CM | POA: Diagnosis not present

## 2018-10-20 DIAGNOSIS — C61 Malignant neoplasm of prostate: Secondary | ICD-10-CM | POA: Insufficient documentation

## 2018-10-20 DIAGNOSIS — G459 Transient cerebral ischemic attack, unspecified: Secondary | ICD-10-CM | POA: Insufficient documentation

## 2018-10-20 DIAGNOSIS — R531 Weakness: Secondary | ICD-10-CM | POA: Diagnosis not present

## 2018-10-20 DIAGNOSIS — Z86711 Personal history of pulmonary embolism: Secondary | ICD-10-CM | POA: Insufficient documentation

## 2018-10-20 DIAGNOSIS — H532 Diplopia: Secondary | ICD-10-CM | POA: Insufficient documentation

## 2018-10-20 DIAGNOSIS — I491 Atrial premature depolarization: Secondary | ICD-10-CM | POA: Diagnosis not present

## 2018-10-20 DIAGNOSIS — Z8249 Family history of ischemic heart disease and other diseases of the circulatory system: Secondary | ICD-10-CM | POA: Insufficient documentation

## 2018-10-20 DIAGNOSIS — Z7901 Long term (current) use of anticoagulants: Secondary | ICD-10-CM | POA: Insufficient documentation

## 2018-10-20 DIAGNOSIS — J8410 Pulmonary fibrosis, unspecified: Secondary | ICD-10-CM | POA: Insufficient documentation

## 2018-10-20 DIAGNOSIS — J84112 Idiopathic pulmonary fibrosis: Secondary | ICD-10-CM | POA: Diagnosis not present

## 2018-10-20 DIAGNOSIS — I63031 Cerebral infarction due to thrombosis of right carotid artery: Secondary | ICD-10-CM

## 2018-10-20 DIAGNOSIS — I2510 Atherosclerotic heart disease of native coronary artery without angina pectoris: Secondary | ICD-10-CM | POA: Diagnosis present

## 2018-10-20 DIAGNOSIS — Z9981 Dependence on supplemental oxygen: Secondary | ICD-10-CM | POA: Diagnosis not present

## 2018-10-20 DIAGNOSIS — R4189 Other symptoms and signs involving cognitive functions and awareness: Secondary | ICD-10-CM | POA: Insufficient documentation

## 2018-10-20 DIAGNOSIS — Z8546 Personal history of malignant neoplasm of prostate: Secondary | ICD-10-CM | POA: Diagnosis not present

## 2018-10-20 DIAGNOSIS — Z79899 Other long term (current) drug therapy: Secondary | ICD-10-CM | POA: Insufficient documentation

## 2018-10-20 DIAGNOSIS — Z209 Contact with and (suspected) exposure to unspecified communicable disease: Secondary | ICD-10-CM | POA: Diagnosis not present

## 2018-10-20 DIAGNOSIS — R51 Headache: Secondary | ICD-10-CM | POA: Diagnosis not present

## 2018-10-20 HISTORY — DX: Other symptoms and signs involving cognitive functions and awareness: R41.89

## 2018-10-20 LAB — CBC WITH DIFFERENTIAL/PLATELET
Abs Immature Granulocytes: 0.11 10*3/uL — ABNORMAL HIGH (ref 0.00–0.07)
Basophils Absolute: 0 10*3/uL (ref 0.0–0.1)
Basophils Relative: 0 %
Eosinophils Absolute: 0 10*3/uL (ref 0.0–0.5)
Eosinophils Relative: 0 %
HCT: 34 % — ABNORMAL LOW (ref 39.0–52.0)
Hemoglobin: 10.8 g/dL — ABNORMAL LOW (ref 13.0–17.0)
Immature Granulocytes: 1 %
Lymphocytes Relative: 19 %
Lymphs Abs: 2 10*3/uL (ref 0.7–4.0)
MCH: 30.8 pg (ref 26.0–34.0)
MCHC: 31.8 g/dL (ref 30.0–36.0)
MCV: 96.9 fL (ref 80.0–100.0)
Monocytes Absolute: 0.5 10*3/uL (ref 0.1–1.0)
Monocytes Relative: 5 %
Neutro Abs: 7.6 10*3/uL (ref 1.7–7.7)
Neutrophils Relative %: 75 %
Platelets: 266 10*3/uL (ref 150–400)
RBC: 3.51 MIL/uL — ABNORMAL LOW (ref 4.22–5.81)
RDW: 14 % (ref 11.5–15.5)
WBC: 10.2 10*3/uL (ref 4.0–10.5)
nRBC: 0 % (ref 0.0–0.2)

## 2018-10-20 LAB — URINALYSIS, ROUTINE W REFLEX MICROSCOPIC
Bilirubin Urine: NEGATIVE
Glucose, UA: NEGATIVE mg/dL
Ketones, ur: NEGATIVE mg/dL
Nitrite: NEGATIVE
Protein, ur: 30 mg/dL — AB
Specific Gravity, Urine: 1.016 (ref 1.005–1.030)
WBC, UA: >50 WBC/hpf — ABNORMAL HIGH (ref 0–5)
pH: 5 (ref 5.0–8.0)

## 2018-10-20 LAB — COMPREHENSIVE METABOLIC PANEL
ALT: 85 U/L — ABNORMAL HIGH (ref 0–44)
AST: 73 U/L — ABNORMAL HIGH (ref 15–41)
Albumin: 2.6 g/dL — ABNORMAL LOW (ref 3.5–5.0)
Alkaline Phosphatase: 103 U/L (ref 38–126)
Anion gap: 10 (ref 5–15)
BUN: 17 mg/dL (ref 8–23)
CO2: 27 mmol/L (ref 22–32)
Calcium: 8.8 mg/dL — ABNORMAL LOW (ref 8.9–10.3)
Chloride: 106 mmol/L (ref 98–111)
Creatinine, Ser: 1.35 mg/dL — ABNORMAL HIGH (ref 0.61–1.24)
GFR calc Af Amer: 55 mL/min — ABNORMAL LOW (ref >60)
GFR calc non Af Amer: 47 mL/min — ABNORMAL LOW (ref >60)
Glucose, Bld: 113 mg/dL — ABNORMAL HIGH (ref 70–99)
Potassium: 3.9 mmol/L (ref 3.5–5.1)
Sodium: 143 mmol/L (ref 135–145)
Total Bilirubin: 0.8 mg/dL (ref 0.3–1.2)
Total Protein: 7.2 g/dL (ref 6.5–8.1)

## 2018-10-20 LAB — LACTIC ACID, PLASMA
Lactic Acid, Venous: 1.2 mmol/L (ref 0.5–1.9)
Lactic Acid, Venous: 0.9 mmol/L (ref 0.5–1.9)

## 2018-10-20 LAB — LIPASE, BLOOD: Lipase: 29 U/L (ref 11–51)

## 2018-10-20 LAB — SARS CORONAVIRUS 2 BY RT PCR (HOSPITAL ORDER, PERFORMED IN ~~LOC~~ HOSPITAL LAB): SARS Coronavirus 2: NEGATIVE

## 2018-10-20 MED ORDER — POTASSIUM CHLORIDE IN NACL 20-0.9 MEQ/L-% IV SOLN
INTRAVENOUS | Status: DC
  Administered 2018-10-20 – 2018-10-21 (×2): via INTRAVENOUS
  Filled 2018-10-20 (×3): qty 1000

## 2018-10-20 MED ORDER — SODIUM CHLORIDE 0.9 % IV SOLN
1.0000 g | INTRAVENOUS | Status: DC
  Filled 2018-10-20: qty 10

## 2018-10-20 MED ORDER — RIVAROXABAN 20 MG PO TABS
20.0000 mg | ORAL_TABLET | Freq: Every day | ORAL | Status: DC
  Administered 2018-10-20: 20 mg via ORAL
  Filled 2018-10-20: qty 1

## 2018-10-20 MED ORDER — TRAZODONE HCL 50 MG PO TABS: 25.0000 mg | ORAL_TABLET | Freq: Every evening | ORAL | Status: DC | PRN

## 2018-10-20 MED ORDER — LORATADINE 10 MG PO TABS
10.0000 mg | ORAL_TABLET | Freq: Every day | ORAL | Status: DC
  Administered 2018-10-20 – 2018-10-21 (×2): 10 mg via ORAL
  Filled 2018-10-20 (×2): qty 1

## 2018-10-20 MED ORDER — ONDANSETRON HCL 4 MG PO TABS: 4.0000 mg | ORAL_TABLET | Freq: Four times a day (QID) | ORAL | Status: DC | PRN

## 2018-10-20 MED ORDER — SODIUM CHLORIDE 0.9% FLUSH
3.0000 mL | Freq: Two times a day (BID) | INTRAVENOUS | Status: DC
  Administered 2018-10-20 – 2018-10-21 (×2): 3 mL via INTRAVENOUS

## 2018-10-20 MED ORDER — IBUPROFEN 600 MG PO TABS: 600.0000 mg | ORAL_TABLET | Freq: Four times a day (QID) | ORAL | Status: DC | PRN

## 2018-10-20 MED ORDER — ROSUVASTATIN CALCIUM 5 MG PO TABS
5.0000 mg | ORAL_TABLET | Freq: Every day | ORAL | Status: DC
  Administered 2018-10-20: 5 mg via ORAL
  Filled 2018-10-20: qty 1

## 2018-10-20 MED ORDER — AZELASTINE HCL 0.1 % NA SOLN
2.0000 | Freq: Two times a day (BID) | NASAL | Status: DC
  Administered 2018-10-20 – 2018-10-21 (×2): 2 via NASAL
  Filled 2018-10-20: qty 30

## 2018-10-20 MED ORDER — ACETAMINOPHEN 500 MG PO TABS: 1000.0000 mg | ORAL_TABLET | Freq: Four times a day (QID) | ORAL | Status: DC | PRN

## 2018-10-20 MED ORDER — SODIUM CHLORIDE 0.9 % IV SOLN
1.0000 g | Freq: Once | INTRAVENOUS | Status: AC
  Administered 2018-10-20: 1 g via INTRAVENOUS
  Filled 2018-10-20: qty 10

## 2018-10-20 MED ORDER — RESVERATROL 100 MG PO CAPS: 100.0000 mg | ORAL_CAPSULE | Freq: Every evening | ORAL | Status: DC

## 2018-10-20 MED ORDER — ASPIRIN EC 81 MG PO TBEC
81.0000 mg | DELAYED_RELEASE_TABLET | Freq: Every day | ORAL | Status: DC
  Administered 2018-10-20 – 2018-10-21 (×2): 81 mg via ORAL
  Filled 2018-10-20 (×2): qty 1

## 2018-10-20 MED ORDER — CEREFOLIN 6-1-50-5 MG PO TABS: 1.0000 | ORAL_TABLET | Freq: Every day | ORAL | Status: DC

## 2018-10-20 MED ORDER — ADULT MULTIVITAMIN W/MINERALS CH
1.0000 | ORAL_TABLET | Freq: Every day | ORAL | Status: DC
  Administered 2018-10-20 – 2018-10-21 (×2): 1 via ORAL
  Filled 2018-10-20 (×2): qty 1

## 2018-10-20 MED ORDER — ONDANSETRON HCL 4 MG/2ML IJ SOLN: 4.0000 mg | Freq: Four times a day (QID) | INTRAMUSCULAR | Status: DC | PRN

## 2018-10-20 MED ORDER — POLYETHYLENE GLYCOL 3350 17 G PO PACK: 17.0000 g | PACK | Freq: Every day | ORAL | Status: DC | PRN

## 2018-10-20 MED ORDER — SODIUM CHLORIDE 0.9 % IV BOLUS
1000.0000 mL | Freq: Once | INTRAVENOUS | Status: AC
  Administered 2018-10-20: 1000 mL via INTRAVENOUS

## 2018-10-20 MED ORDER — PREDNISONE 10 MG PO TABS
10.0000 mg | ORAL_TABLET | Freq: Every day | ORAL | Status: DC
  Administered 2018-10-21: 10 mg via ORAL
  Filled 2018-10-20: qty 1

## 2018-10-20 MED ORDER — OMEGA-3-ACID ETHYL ESTERS 1 G PO CAPS
1.0000 g | ORAL_CAPSULE | Freq: Every day | ORAL | Status: DC
  Administered 2018-10-20 – 2018-10-21 (×2): 1 g via ORAL
  Filled 2018-10-20 (×2): qty 1

## 2018-10-20 MED ORDER — PANTOPRAZOLE SODIUM 40 MG PO TBEC
40.0000 mg | DELAYED_RELEASE_TABLET | Freq: Every day | ORAL | Status: DC
  Administered 2018-10-20: 40 mg via ORAL
  Filled 2018-10-20: qty 1

## 2018-10-20 MED ORDER — DM-GUAIFENESIN ER 30-600 MG PO TB12
1.0000 | ORAL_TABLET | Freq: Two times a day (BID) | ORAL | Status: DC
  Administered 2018-10-20 – 2018-10-21 (×2): 1 via ORAL
  Filled 2018-10-20 (×2): qty 1

## 2018-10-20 NOTE — H&P (Signed)
History and Physical    [name] OVF:[id number] DOB: July 19, 1929 DOA: 10/20/2018  PCP: [name] Pillar, MD  Patient coming from: Home  I have personally briefly reviewed patient's old medical records in [place]  Chief Complaint: Weakness with concern for urinary tract infection  HPI: [name] [name] is a 85 y.o. male with medical history significant of abdominal aortic aneurysm, hyperlipidemia, prostate cancer cancer, old stroke with left-sided weakness, pulmonary fibrosis with chronic respiratory failure on 3 L of oxygen at rest and 5 L while walking, pulmonary emboli on Xarelto, carotid artery stenosis who presents with fever and generalized weakness with associated foul-smelling urine.  Patient was seen in the emergency department he was not exactly sure why he is here.  Wife states that he has had a couple days of generalized weakness with fatigue.  This morning he spiked a fever to 100 degrees and she became concerned that he might have a urinary infection.  Morning while going to the bathroom he slid out of the bed and was assisted by his wife.  He did not strike his head or hurt anything but this is not uncommon for him.  Patient has mild dementia and sometimes will initiate conversation but frequently is very quiet.  His symptoms vary in terms of his degree of alertness and oxygen requirements.  Reports that his hands have been tremulous and he has not been focusing well.  Is much less alert than normal.  Wife says she is not sure if his thermometer was accurate this morning when it registered a temperature of 100 degrees but he felt hot. Patient denies cough, nausea, vomiting, diarrhea, chest pain, or new dyspnea.  He has no change in his pulmonary fibrosis cough or shortness of breath.  He did not eat well yesterday.  He has been drinking acceptably today and yesterday.  He did not eat breakfast prior to coming to the emergency department today.  Wife said she was unable to get him off the  floor on her own and was very concerned about taking him home and therefore requests admission to the hospital.  ED Course: Afebrile after having received Tylenol at home.  CT scan of the head was done due to being on Xarelto there were no acute findings noted.  Laboratory shows mild AKI with normal lactic acid.  Urinalysis consistent with a urinary tract infection as likely etiology of his symptoms.  Patient referred to me for further evaluation and management.  Urine and blood culture sent from the emergency department.  Review of Systems: As per HPI otherwise all other systems reviewed and  negative.   Past Medical History:  Diagnosis Date   AAA (abdominal aortic aneurysm) (HCC)    Allergy    Carotid artery stenosis    Cognitive decline    Diplopia    GERD (gastroesophageal reflux disease)    Hyperlipidemia    Memory loss    Prostate cancer ([place])    Pulmonary fibrosis ([place])    Spondylisthesis 2015   Spine   Stroke ([place]) 04/18/13   some weakness left-returning strength   TIA (transient ischemic attack)     Past Surgical History:  Procedure Laterality Date   ABDOMINAL AORTIC ENDOVASCULAR STENT GRAFT N/A 06/17/2016   Procedure: ABDOMINAL AORTIC ENDOVASCULAR STENT GRAFT;  Surgeon: [name] Sandy, MD;  [facility];  Service: Vascular;  Laterality: N/A;   CAROTID ENDARTERECTOMY  Sept. 2008   Elective left carotid endarterectomy   CAROTID ENDARTERECTOMY Right 04/27/2013  CE   ENDARTERECTOMY Right 04/27/2013   Procedure: ENDARTERECTOMY CAROTID;  Surgeon: [name] Misty, MD;  Location: [facility] OR;  Service: Vascular;  Laterality: Right;   EYE SURGERY     Bilateral cataract    FOOT SURGERY      left Achilles Tendon Repair   seed implantation  2001   for prostate ca    Social History   Social History Narrative   Not on file     reports that he quit smoking about 53 years ago. His smoking use included cigarettes. He has a 14.00 pack-year smoking  history. He has never used smokeless tobacco. He reports current alcohol use of about 1.0 standard drinks of alcohol per week. He reports that he does not use drugs.  Allergies  Allergen Reactions   Sudafed [Pseudoephedrine Hcl]     Causes dizziness and nervousness   Atorvastatin Other (See Comments)    Reaction: Patient's family reports "confusion" and would prefer crestor     Family History  Problem Relation Age of Onset   Stroke Mother    Heart disease Mother        After age 69   Varicose Veins Mother    Hypertension Mother    Cancer Mother        [name] [name]    Stroke Father    Heart attack Father    Deep vein thrombosis Father    Anuerysm Father    Heart disease Brother     Prior to Admission medications   Medication Sig Start Date End Date Taking? Authorizing Provider  acetaminophen (TYLENOL) 500 MG tablet Take 500 mg by mouth every 6 (six) hours as needed for mild pain.    [provider]  azelastine (ASTELIN) 0.1 % nasal spray Place 2 sprays into both nostrils 2 (two) times daily.  01/08/15   [provider]  dextromethorphan-guaiFENesin (MUCINEX DM) 30-600 MG 12hr tablet Take 1 tablet by mouth 2 (two) times daily.     [provider]  L-Methylfolate-B12-B6-B2 (CEREFOLIN) 08-20-48-5 MG TABS TAKE 1 TABLET BY MOUTH EVERY DAY 03/30/18   [name] Bible, NP  Multiple Vitamins-Minerals (CENTRUM SILVER PO) Take 1 tablet by mouth daily.     [provider]  Omega-3 Fatty Acids (FISH OIL PO) Take 1 capsule by mouth daily.    [provider]  pantoprazole (PROTONIX) 40 MG tablet TAKE 1 TABLET DAILY,30 MINUTES BEFORE DINNER. 07/25/18   [name] Doom, MD  predniSONE (DELTASONE) 10 MG tablet TAKE 1 TABLET EVERY DAY WITH BREAKFAST. Patient taking differently: daily.  09/20/18   [name] Doom, MD  Resveratrol 100 MG CAPS Take 100 mg by mouth every evening.     [provider]  rivaroxaban (XARELTO) 20 MG TABS  tablet Take 1 tablet (20 mg total) by mouth daily with supper. 09/11/18   [name] Foy, NP  rosuvastatin (CRESTOR) 5 MG tablet Take 5 mg by mouth at bedtime.    [provider]    Physical Exam:  Constitutional: NAD, calm, comfortable, does not [name] demented Vitals:   10/20/18 1038 10/20/18 1041 10/20/18 1126 10/20/18 1515  BP: 116/68   (!) 147/82  Pulse: 72     Resp: 17   13  Temp: 98.7 F (37.1 C)     TempSrc: Oral     SpO2: 98% 98% 98%    Eyes: PERRL, lids and conjunctivae normal ENMT: Mucous membranes are dry. Posterior pharynx clear of any exudate or lesions.Normal dentition.  Neck:  normal, supple, no masses, no thyromegaly Respiratory: clear to auscultation bilaterally, no wheezing, no crackles. Normal respiratory effort. No accessory muscle use.  Cardiovascular: Regular rate and rhythm, no murmurs / rubs / gallops. No extremity edema. 2+ pedal pulses. No carotid bruits.  Abdomen: no tenderness, no masses palpated. No hepatosplenomegaly. Bowel sounds positive.  Musculoskeletal: no clubbing / cyanosis. No joint deformity upper and lower extremities. Good ROM, no contractures. Normal muscle tone.  Skin: no rashes, lesions, ulcers. No induration Neurologic: CN 2-12 grossly intact. Sensation intact, DTR normal. Strength 4-/5 in all 4.  Psychiatric: Normal judgment and insight. Alert and oriented x 2(name and location) normal mood.  Flat affect   Labs on Admission: I have personally reviewed following labs and imaging studies  CBC: Recent Labs  Lab 10/20/18 1154  WBC 10.2  NEUTROABS 7.6  HGB 10.8*  HCT 34.0*  MCV 96.9  PLT 580   Basic Metabolic Panel: Recent Labs  Lab 10/20/18 1154  NA 143  K 3.9  CL 106  CO2 27  GLUCOSE 113*  BUN 17  CREATININE 1.35*  CALCIUM 8.8*   Liver Function Tests: Recent Labs  Lab 10/20/18 1154  AST 73*  ALT 85*  ALKPHOS 103  BILITOT 0.8  PROT 7.2  ALBUMIN 2.6*   Recent Labs  Lab 10/20/18 1154  LIPASE 29    Urine analysis:    Component Value Date/Time   COLORURINE AMBER (A) 10/20/2018 1154   APPEARANCEUR HAZY (A) 10/20/2018 1154   LABSPEC 1.016 10/20/2018 1154   PHURINE 5.0 10/20/2018 1154   GLUCOSEU NEGATIVE 10/20/2018 1154   HGBUR MODERATE (A) 10/20/2018 1154   BILIRUBINUR NEGATIVE 10/20/2018 1154   KETONESUR NEGATIVE 10/20/2018 1154   PROTEINUR 30 (A) 10/20/2018 1154   UROBILINOGEN 0.2 04/25/2013 1449   NITRITE NEGATIVE 10/20/2018 1154   LEUKOCYTESUR MODERATE (A) 10/20/2018 1154    Radiological Exams on Admission: Ct Head Wo Contrast  Result Date: 10/20/2018 CLINICAL DATA:  85 year old male with headache weakness and fall. EXAM: CT HEAD WITHOUT CONTRAST TECHNIQUE: Contiguous axial images were obtained from the base of the skull through the vertex without intravenous contrast. COMPARISON:  Brain MRI 04/19/2013. Head CT 10/15/2015. FINDINGS: Brain: Stable cerebral volume. Patchy bilateral white matter hypodensity and a small area of right motor strip cortical encephalomalacia appears stable. No midline shift, ventriculomegaly, mass effect, evidence of mass lesion, intracranial hemorrhage or evidence of cortically based acute infarction. No other cortical encephalomalacia identified. There is chronic anterior temporal lobe volume loss. Vascular: Calcified atherosclerosis at the skull base. No suspicious intracranial vascular hyperdensity. Skull: No acute osseous abnormality identified. Sinuses/Orbits: Paranasal sinuses are well pneumatized overall. There is mild new left frontal and frontoethmoidal recess mucosal thickening and bubbly opacity. Tympanic cavities and mastoids are clear. Other: No acute orbit or scalp soft tissue findings. IMPRESSION: 1. No acute intracranial abnormality. No acute traumatic injury identified. 2. Stable non contrast CT appearance of mostly small vessel chronic ischemic disease. 3. Mild inflammatory paranasal sinus disease. Electronically Signed   By: Genevie Ann M.D.    On: 10/20/2018 11:54   Dg Chest Portable 1 View  Result Date: 10/20/2018 CLINICAL DATA:  Fever EXAM: PORTABLE CHEST 1 VIEW COMPARISON:  09/21/2018 FINDINGS: Severe interstitial fibrosis left greater than right is unchanged. No superimposed infiltrate or effusion. Negative for heart failure. IMPRESSION: Severe interstitial fibrosis, stable from the prior study. Electronically Signed   By: Franchot Gallo M.D.   On: 10/20/2018 11:39     Assessment/Plan Principal Problem:  UTI (urinary tract infection) Active Problems:   IPF (idiopathic pulmonary fibrosis) (HCC)   Mild cognitive impairment   Chronic hypoxemic respiratory failure (HCC)   AAA (abdominal aortic aneurysm) without rupture (HCC)   CAD (coronary artery disease)   History of stroke   GERD (gastroesophageal reflux disease)    1.  Urinary tract infection with associated weakness: Received a dose of Rocephin in the emergency department we will continue that with gentle IV fluid.  Will monitor him closely and have PT and OT evaluate the patient tomorrow.  Wife states that he was recently receiving home PT and when that completed he was offered physical therapy at a center but it was in [place].  She wishes to have a home health company that has ties here in [place] because she could not take him to [place] for outpatient therapy.  2.  Idiopathic pulmonary fibrosis: Patient's usual dose of oxygen is 3 L but 5 L when he ambulates.  He is doing fairly well with this.  We will continue current plan.  3.  Mild cognitive impairment: Noted continue supportive care.  4.  Chronic hypoxic respiratory failure: Noted continue oxygen supplementation.  5.  Abdominal aortic aneurysm without rupture: Noted continue blood pressure management.  6.  Coronary artery disease: Noted continue home medication management.  7.  History of stroke noted continue to modify risk factors.  8.  Gastroesophageal reflux disease: Continue home time pump  inhibitor.  9.  CODE STATUS full code.  Would suggest addressing this this admission with the patient and his wife.  DVT prophylaxis: Patient is anticoagulated Code Status: Full code Family Communication: Spoke at length with patient's wife who was present during admission. Disposition Plan: Likely home with additional support.  Discussed with the wife the possibility of hiring some additional help as well as getting home health back in the house.  Will defer to case management.  Likely home in 24 hours  Consults called: None Admission status: It is my clinical opinion that referral for OBSERVATION is reasonable and necessary in this patient based on the above information provided. The aforementioned taken together are felt to place the patient at high risk for further clinical deterioration. However it is anticipated that the patient may be medically stable for discharge from the hospital within 24 to 48 hours.    Lady Deutscher MD FACP [facility] Pager [phone]  How to contact the [facility] Attending or Consulting provider [place] or covering provider during after hours [place], for this patient?  1. Check the care team in [facility] [address] and look for a) attending/consulting [facility] provider listed and b) the [facility] team listed 2. Log into [email or web address] and use [place]'s universal password to access. If you do not have the password, please contact the hospital operator. 3. Locate the [facility] provider you are looking for under [facility] and page to a number that you can be directly reached. 4. If you still have difficulty reaching the provider, please page the [facility] (Director on Call) for the Hospitalists listed on amion for assistance.  If 7PM-7AM, please contact night-coverage [email or web address] Password [facility]  10/20/2018, 4:04 PM

## 2018-10-20 NOTE — Progress Notes (Signed)
PHARMACIST - PHYSICIAN ORDER COMMUNICATION  CONCERNING: P&T Medication Policy on Herbal Medications  DESCRIPTION:  This patient's order for: Resveratrol and Cerefolin has been noted.  This product(s) is classified as an "herbal" or natural product. Due to a lack of definitive safety studies or FDA approval, nonstandard manufacturing practices, plus the potential risk of unknown drug-drug interactions while on inpatient medications, the Pharmacy and Therapeutics Committee does not permit the use of "herbal" or natural products of this type within [facility].   ACTION TAKEN: The pharmacy department is unable to verify this order at this time and your patient has been informed of this safety policy. Please reevaluate patient's clinical condition at discharge and address if the herbal or natural product(s) should be resumed at that time.   Marnie Fazzino S. Alford Highland, PharmD, [place] Clinical Staff Pharmacist Pager [phone]

## 2018-10-20 NOTE — ED Notes (Signed)
ED TO INPATIENT HANDOFF REPORT  ED Nurse Name and Phone #: Kathlee Nations [phone]  S Name/Age/Gender Vic Blackbird 85 y.o. male Room/Bed: 034C/034C  Code Status   Code Status: Prior  Home/SNF/Other Home Patient oriented to: self, place, time and situation Is this baseline? Yes   Triage Complete: Triage complete  Chief Complaint uti  Triage Note Pt coming from home. Wife called EMS d/t pt having increased weakness. Pt slid out of bed this am (without a fall). Pt was helped up by wife and placed in wheelchair which he usually doesn't use. Reporting fowl smelling urine X2 days with intermittent fevers, being treated with tylenol.     Allergies Allergies  Allergen Reactions  . Sudafed [Pseudoephedrine Hcl]     Causes dizziness and nervousness  . Atorvastatin Other (See Comments)    Reaction: Patient's family reports "confusion" and would prefer crestor     Level of Care/Admitting Diagnosis ED Disposition    ED Disposition Condition [place]: [place] [[id number]]  Level of Care: Med-Surg [16]  I expect the patient will be discharged within 24 hours: Yes  LOW acuity---Tx typically complete <24 hrs---ACUTE conditions typically can be evaluated <24 hours---LABS likely to return to acceptable levels <24 hours---IS near functional baseline---EXPECTED to return to current living arrangement---NOT newly hypoxic: Meets criteria for 5C-Observation unit  Covid Evaluation: Asymptomatic Screening Protocol (No Symptoms)  Diagnosis: UTI (urinary tract infection) [476546]  Admitting Physician: Lady Deutscher [[id number]]  Attending Physician: Lady Deutscher [[id number]]  PT Class (Do Not Modify): Observation [104]  PT Acc Code (Do Not Modify): Observation [10022]       B Medical/Surgery History Past Medical History:  Diagnosis Date  . AAA (abdominal aortic aneurysm) ([place])   . Allergy   . Carotid artery stenosis   . Diplopia   . GERD (gastroesophageal  reflux disease)   . Hyperlipidemia   . Memory loss   . Prostate cancer ([place])   . Pulmonary fibrosis ([place])   . Spondylisthesis 2015   Spine  . Stroke ([place]) 04/18/13   some weakness left-returning strength  . TIA (transient ischemic attack)    Past Surgical History:  Procedure Laterality Date  . ABDOMINAL AORTIC ENDOVASCULAR STENT GRAFT N/A 06/17/2016   Procedure: ABDOMINAL AORTIC ENDOVASCULAR STENT GRAFT;  Surgeon: Waynetta Sandy, MD;  Location: [facility] OR;  Service: Vascular;  Laterality: N/A;  . CAROTID ENDARTERECTOMY  Sept. 2008   Elective left carotid endarterectomy  . CAROTID ENDARTERECTOMY Right 04/27/2013   CE  . ENDARTERECTOMY Right 04/27/2013   Procedure: ENDARTERECTOMY CAROTID;  Surgeon: Mal Misty, MD;  Location: [place];  Service: Vascular;  Laterality: Right;  . EYE SURGERY     Bilateral cataract   . FOOT SURGERY      left Achilles Tendon Repair  . seed implantation  2001   for prostate ca     A IV Location/Drains/Wounds Patient Lines/Drains/Airways Status   Active Line/Drains/Airways    Name:   Placement date:   Placement time:   Site:   Days:   Peripheral IV 10/20/18 Anterior;Left;Proximal Forearm   10/20/18    1127    Forearm   less than 1   Peripheral IV 10/20/18 Right Hand   10/20/18    1203    Hand   less than 1   Incision (Closed) 06/17/16 Groin Left   06/17/16    0924     855   Incision (Closed) 06/17/16 Groin Right  06/17/16    0924     855          Intake/Output Last 24 hours  Intake/Output Summary (Last 24 hours) at 10/20/2018 1406 Last data filed at 10/20/2018 1404 Gross per 24 hour  Intake 100 ml  Output -  Net 100 ml    Labs/Imaging Results for orders placed or performed during the hospital encounter of 10/20/18 (from the past 48 hour(s))  CBC with Differential     Status: Abnormal   Collection Time: 10/20/18 11:54 AM  Result Value Ref Range   WBC 10.2 4.0 - 10.5 K/uL   RBC 3.51 (L) 4.22 - 5.81 MIL/uL   Hemoglobin 10.8 (L) 13.0 -  17.0 g/dL   HCT 34.0 (L) 39.0 - 52.0 %   MCV 96.9 80.0 - 100.0 fL   MCH 30.8 26.0 - 34.0 pg   MCHC 31.8 30.0 - 36.0 g/dL   RDW 14.0 11.5 - 15.5 %   Platelets 266 150 - 400 K/uL   nRBC 0.0 0.0 - 0.2 %   Neutrophils Relative % 75 %   Neutro Abs 7.6 1.7 - 7.7 K/uL   Lymphocytes Relative 19 %   Lymphs Abs 2.0 0.7 - 4.0 K/uL   Monocytes Relative 5 %   Monocytes Absolute 0.5 0.1 - 1.0 K/uL   Eosinophils Relative 0 %   Eosinophils Absolute 0.0 0.0 - 0.5 K/uL   Basophils Relative 0 %   Basophils Absolute 0.0 0.0 - 0.1 K/uL   Immature Granulocytes 1 %   Abs Immature Granulocytes 0.11 (H) 0.00 - 0.07 K/uL    Comment: Performed at [facility], [address] [place], [place] [address]  Comprehensive metabolic panel     Status: Abnormal   Collection Time: 10/20/18 11:54 AM  Result Value Ref Range   Sodium 143 135 - 145 mmol/L   Potassium 3.9 3.5 - 5.1 mmol/L   Chloride 106 98 - 111 mmol/L   CO2 27 22 - 32 mmol/L   Glucose, Bld 113 (H) 70 - 99 mg/dL   BUN 17 8 - 23 mg/dL   Creatinine, Ser 1.35 (H) 0.61 - 1.24 mg/dL   Calcium 8.8 (L) 8.9 - 10.3 mg/dL   Total Protein 7.2 6.5 - 8.1 g/dL   Albumin 2.6 (L) 3.5 - 5.0 g/dL   AST 73 (H) 15 - 41 U/L   ALT 85 (H) 0 - 44 U/L   Alkaline Phosphatase 103 38 - 126 U/L   Total Bilirubin 0.8 0.3 - 1.2 mg/dL   GFR calc non Af Amer 47 (L) >60 mL/min   GFR calc Af Amer 55 (L) >60 mL/min   Anion gap 10 5 - 15    Comment: Performed at [facility], [place] [address], [place], [place] [address]  Lipase, blood     Status: None   Collection Time: 10/20/18 11:54 AM  Result Value Ref Range   Lipase 29 11 - 51 U/L    Comment: Performed at [facility] [address] [place], [place] [address]  Urinalysis, Routine w reflex microscopic     Status: Abnormal   Collection Time: 10/20/18 11:54 AM  Result Value Ref Range   Color, Urine AMBER (A) YELLOW    Comment: BIOCHEMICALS MAY BE AFFECTED BY COLOR   APPearance HAZY (A) CLEAR   Specific  Gravity, Urine 1.016 1.005 - 1.030   pH 5.0 5.0 - 8.0   Glucose, UA NEGATIVE NEGATIVE mg/dL  Hgb urine dipstick MODERATE (A) NEGATIVE   Bilirubin Urine NEGATIVE NEGATIVE   Ketones, ur NEGATIVE NEGATIVE mg/dL   Protein, ur 30 (A) NEGATIVE mg/dL   Nitrite NEGATIVE NEGATIVE   Leukocytes,Ua MODERATE (A) NEGATIVE   RBC / HPF 0-5 0 - 5 RBC/hpf   WBC, UA >50 (H) 0 - 5 WBC/hpf   Bacteria, UA MANY (A) NONE SEEN   Mucus PRESENT    Hyaline Casts, UA PRESENT     Comment: Performed at [facility] [address], [place], [place] [address]  Lactic acid, plasma     Status: None   Collection Time: 10/20/18 11:54 AM  Result Value Ref Range   Lactic Acid, Venous 1.2 0.5 - 1.9 mmol/L    Comment: Performed at [facility] [address] [place], [place] [address]   Ct Head Wo Contrast  Result Date: 10/20/2018 CLINICAL DATA:  85 year old male with headache weakness and fall. EXAM: CT HEAD WITHOUT CONTRAST TECHNIQUE: Contiguous axial images were obtained from the base of the skull through the vertex without intravenous contrast. COMPARISON:  Brain MRI 04/19/2013. Head CT 10/15/2015. FINDINGS: Brain: Stable cerebral volume. Patchy bilateral white matter hypodensity and a small area of right motor strip cortical encephalomalacia appears stable. No midline shift, ventriculomegaly, mass effect, evidence of mass lesion, intracranial hemorrhage or evidence of cortically based acute infarction. No other cortical encephalomalacia identified. There is chronic anterior temporal lobe volume loss. Vascular: Calcified atherosclerosis at the skull base. No suspicious intracranial vascular hyperdensity. Skull: No acute osseous abnormality identified. Sinuses/Orbits: Paranasal sinuses are well pneumatized overall. There is mild new left frontal and frontoethmoidal recess mucosal thickening and bubbly opacity. Tympanic cavities and mastoids are clear. Other: No acute orbit or scalp soft tissue findings. IMPRESSION:  1. No acute intracranial abnormality. No acute traumatic injury identified. 2. Stable non contrast CT appearance of mostly small vessel chronic ischemic disease. 3. Mild inflammatory paranasal sinus disease. Electronically Signed   By: Genevie Ann M.D.   On: 10/20/2018 11:54   Dg Chest Portable 1 View  Result Date: 10/20/2018 CLINICAL DATA:  Fever EXAM: PORTABLE CHEST 1 VIEW COMPARISON:  09/21/2018 FINDINGS: Severe interstitial fibrosis left greater than right is unchanged. No superimposed infiltrate or effusion. Negative for heart failure. IMPRESSION: Severe interstitial fibrosis, stable from the prior study. Electronically Signed   By: Franchot Gallo M.D.   On: 10/20/2018 11:39    Pending Labs Unresulted Labs (From admission, onward)    Start     Ordered   10/20/18 1318  SARS Coronavirus 2 [facility] order, Performed in [facility] hospital lab) Nasopharyngeal Nasopharyngeal Swab  (Symptomatic/High Risk of Exposure Patients Labs with Precautions)  Once,   STAT    Question Answer Comment  Is this test for diagnosis or screening Diagnosis of ill patient   Symptomatic for COVID-19 as defined by CDC Yes   Date of Symptom Onset 10/20/2018   Hospitalized for COVID-19 Yes   Admitted to ICU for COVID-19 No   Previously tested for COVID-19 No   Resident in a congregate (group) care setting No   Employed in healthcare setting No      10/20/18 1317   10/20/18 1106  Blood culture (routine x 2)  BLOOD CULTURE X 2,   STAT     10/20/18 1105   10/20/18 1105  Urine culture  ONCE - STAT,   STAT     10/20/18 1105   10/20/18 1105  Lactic acid, plasma  Now then  every 2 hours,   STAT     10/20/18 1105          Vitals/Pain Today's Vitals   10/20/18 1041 10/20/18 1045 10/20/18 1125 10/20/18 1126  BP:      Pulse:      Resp:      Temp:      TempSrc:      SpO2: 98%   98%  PainSc:  5  5      Isolation Precautions Airborne and Contact precautions  Medications Medications  sodium chloride 0.9 % bolus  1,000 mL (1,000 mLs Intravenous New Bag/Given 10/20/18 1203)  cefTRIAXone (ROCEPHIN) 1 g in sodium chloride 0.9 % 100 mL IVPB (0 g Intravenous Stopped 10/20/18 1404)    Mobility walks High fall risk   Focused Assessments UTI obs   R Recommendations: See Admitting Provider Note  Report given to:   Additional Notes: Wife sitting with family

## 2018-10-20 NOTE — ED Triage Notes (Signed)
Pt coming from home. Wife called EMS d/t pt having increased weakness. Pt slid out of bed this am (without a fall). Pt was helped up by wife and placed in wheelchair which he usually doesn't use. Reporting fowl smelling urine X2 days with intermittent fevers, being treated with tylenol.

## 2018-10-20 NOTE — ED Provider Notes (Addendum)
[facility] EMERGENCY DEPARTMENT Provider Note   CSN: [id number] Arrival date & time: 10/20/18  1036    History   Chief Complaint Chief Complaint  Patient presents with  . Urinary Tract Infection    HPI [name] [name] is a 85 y.o. male.     HPI   85 year old male with a history of AAA, hyperlipidemia, prostate cancer, CVA with left-sided weakness, pulmonary fibrosis with chronic respiratory failure on 3 L of oxygen at rest and 5 while walking, pulmonary emboli on Xarelto, carotid artery stenosis who presents with fever this morning, generalized weakness, foul smelling urine.  Patient reports he is here because his wife is concerned but he is not sure exactly why.   Per wife: he has had days of generalized weakness, fatigue with fever this AM  Fell around 5AM this morning going to bathroom. Urine has an odor, had fever this AM and think it is a UTI.  Thinks he has probably had it a few days looking back on it, has mild history of dementia, varying symptoms in terms of alertness and oxygen amount due to pulmonary fibrosis, reports he has not been focusing and his hands have been shaking, not as alert as normal.  Fever was a little over 100 this AM, not sure if thermometer accurate but felt hot.  No cough, no nausea, no vomiting. (no change in cough from pulmonary fibrosis).  No diarrhea. No CP or new dyspnea.  Didn't eat well yesterday, drank ok this morning and yesterday. Did not eat breakfast today prior to coming here.  Did not hit his head or get knocked out today.  Generalized weakness and fatigue, no focal symptoms.   No known sick contacts.   Gave tylenol PTA.  Past Medical History:  Diagnosis Date  . AAA (abdominal aortic aneurysm) ([place])   . Allergy   . Carotid artery stenosis   . Diplopia   . GERD (gastroesophageal reflux disease)   . Hyperlipidemia   . Memory loss   . Prostate cancer ([place])   . Pulmonary fibrosis ([place])   . Spondylisthesis 2015   Spine  . Stroke ([place]) 04/18/13   some weakness left-returning strength  . TIA (transient ischemic attack)     Patient Active Problem List   Diagnosis Date Noted  . Multiple subsegmental pulmonary emboli without acute cor pulmonale 08/24/2018  . Lobar pneumonia, unspecified organism ([place]) 08/24/2018  . Hyperlipidemia 05/31/2017  . Chronic hypoxemic respiratory failure ([place]) 07/20/2016  . Abdominal aortic aneurysm without rupture ([place]) 06/17/2016  . IPF (idiopathic pulmonary fibrosis) ([place]) 07/11/2015  . H/O endovascular stent graft for abdominal aortic aneurysm 07/11/2015  . H/O carotid endarterectomy 07/11/2015  . History of stroke 05/06/2015  . Swelling of ankle joint 04/23/2014  . Memory loss 05/29/2013  . Mild cognitive impairment 05/29/2013  . CAD (coronary artery disease) 05/25/2013  . AAA (abdominal aortic aneurysm) without rupture ([place]) 05/15/2013  . Occlusion and stenosis of carotid artery with cerebral infarction 04/19/2013  . Cerebral infarction ([place]) 04/18/2013  . Other and unspecified hyperlipidemia 04/18/2013  . Carotid stenosis 04/18/2013  . Aneurysm of abdominal vessel ([place]) 04/18/2012  . Occlusion and stenosis of carotid artery without mention of cerebral infarction 10/12/2011    Past Surgical History:  Procedure Laterality Date  . ABDOMINAL AORTIC ENDOVASCULAR STENT GRAFT N/A 06/17/2016   Procedure: ABDOMINAL AORTIC ENDOVASCULAR STENT GRAFT;  Surgeon: Waynetta Sandy, MD;  Location: [facility];  Service: Vascular;  Laterality: N/A;  . CAROTID ENDARTERECTOMY  Sept. 2008   Elective left carotid endarterectomy  . CAROTID ENDARTERECTOMY Right 04/27/2013   CE  . ENDARTERECTOMY Right 04/27/2013   Procedure: ENDARTERECTOMY CAROTID;  Surgeon: Mal Misty, MD;  Location: [facility];  Service: Vascular;  Laterality: Right;  . EYE SURGERY     Bilateral cataract   . FOOT SURGERY      left Achilles Tendon Repair  . seed implantation  2001   for prostate ca         Home Medications    Prior to Admission medications   Medication Sig Start Date End Date Taking? Authorizing Provider  acetaminophen (TYLENOL) 500 MG tablet Take 500 mg by mouth every 6 (six) hours as needed for mild pain.    [provider]  azelastine (ASTELIN) 0.1 % nasal spray Place 2 sprays into both nostrils 2 (two) times daily.  01/08/15   [provider]  cetirizine (ZYRTEC) 10 MG tablet Take 10 mg by mouth daily as needed for allergies.     [provider]  dextromethorphan-guaiFENesin (MUCINEX DM) 30-600 MG 12hr tablet Take 1 tablet by mouth 2 (two) times daily.     [provider]  L-Methylfolate-B12-B6-B2 (CEREFOLIN) 08-20-48-5 MG TABS TAKE 1 TABLET BY MOUTH EVERY DAY 03/30/18   Dennie Bible, NP  Multiple Vitamins-Minerals (CENTRUM SILVER PO) Take 1 tablet by mouth daily.     [provider]  naproxen sodium (ANAPROX) 220 MG tablet Take 220 mg by mouth daily as needed (pain).    [provider]  Omega-3 Fatty Acids (FISH OIL PO) Take 1 capsule by mouth daily.    [provider]  pantoprazole (PROTONIX) 40 MG tablet TAKE 1 TABLET DAILY,30 MINUTES BEFORE DINNER. 07/25/18   Juanito Doom, MD  predniSONE (DELTASONE) 10 MG tablet TAKE 1 TABLET EVERY DAY WITH BREAKFAST. Patient taking differently: daily.  09/20/18   Juanito Doom, MD  Resveratrol 100 MG CAPS Take 100 mg by mouth every evening.     [provider]  rivaroxaban (XARELTO) 20 MG TABS tablet Take 1 tablet (20 mg total) by mouth daily with supper. 09/11/18   Fenton Foy, NP  rosuvastatin (CRESTOR) 5 MG tablet Take 5 mg by mouth at bedtime.    [provider]    Family History Family History  Problem Relation Age of Onset  . Stroke Mother   . Heart disease Mother        After age 73  . Varicose Veins Mother   . Hypertension Mother   . Cancer Mother        Theadora Rama   . Stroke Father   . Heart attack Father   . Deep vein  thrombosis Father   . Anuerysm Father   . Heart disease Brother     Social History Social History   Tobacco Use  . Smoking status: Former Smoker    Packs/day: 1.00    Years: 14.00    Pack years: 14.00    Types: Cigarettes    Quit date: 03/27/1965    Years since quitting: 53.6  . Smokeless tobacco: Never Used  Substance Use Topics  . Alcohol use: Yes    Alcohol/week: 1.0 standard drinks    Types: 1 Shots of liquor per week    Comment: 3-4 drinks week bourbon  . Drug use: No     Allergies   Sudafed [pseudoephedrine hcl] and Atorvastatin   Review of Systems Review of Systems  Constitutional: Positive for activity change,  appetite change, fatigue (per wife) and fever.  HENT: Negative for sore throat.   Eyes: Negative for visual disturbance.  Respiratory: Negative for shortness of breath.   Cardiovascular: Negative for chest pain.  Gastrointestinal: Negative for abdominal pain, diarrhea, nausea and vomiting.  Genitourinary: Negative for difficulty urinating (foul smelling urine per wife).  Musculoskeletal: Negative for back pain and neck stiffness.  Skin: Negative for rash.  Neurological: Negative for syncope and headaches.  Psychiatric/Behavioral: Positive for decreased concentration (per wife).     Physical Exam Updated Vital Signs BP 116/68 (BP Location: Right Arm)   Pulse 72   Temp 98.7 F (37.1 C) (Oral)   Resp 17   SpO2 98% Comment: 5 liters  Physical Exam Vitals signs and nursing note reviewed.  Constitutional:      General: He is not in acute distress.    Appearance: He is well-developed. He is not diaphoretic.  HENT:     Head: Normocephalic and atraumatic.  Eyes:     Conjunctiva/sclera: Conjunctivae normal.  Neck:     Musculoskeletal: Normal range of motion.  Cardiovascular:     Rate and Rhythm: Normal rate and regular rhythm.     Heart sounds: Normal heart sounds. No murmur. No friction rub. No gallop.   Pulmonary:     Effort: Pulmonary effort  is normal. No respiratory distress.     Breath sounds: Normal breath sounds. No wheezing or rales.  Abdominal:     General: There is no distension.     Palpations: Abdomen is soft.     Tenderness: There is no abdominal tenderness. There is no guarding.  Skin:    General: Skin is warm and dry.  Neurological:     Mental Status: He is alert.      ED Treatments / Results  Labs (all labs ordered are listed, but only abnormal results are displayed) Labs Reviewed  CBC WITH DIFFERENTIAL/PLATELET - Abnormal; Notable for the following components:      Result Value   RBC 3.51 (*)    Hemoglobin 10.8 (*)    HCT 34.0 (*)    Abs Immature Granulocytes 0.11 (*)    All other components within normal limits  COMPREHENSIVE METABOLIC PANEL - Abnormal; Notable for the following components:   Glucose, Bld 113 (*)    Creatinine, Ser 1.35 (*)    Calcium 8.8 (*)    Albumin 2.6 (*)    AST 73 (*)    ALT 85 (*)    GFR calc non Af Amer 47 (*)    GFR calc Af Amer 55 (*)    All other components within normal limits  URINALYSIS, ROUTINE W REFLEX MICROSCOPIC - Abnormal; Notable for the following components:   Color, Urine AMBER (*)    APPearance HAZY (*)    Hgb urine dipstick MODERATE (*)    Protein, ur 30 (*)    Leukocytes,Ua MODERATE (*)    WBC, UA >50 (*)    Bacteria, UA MANY (*)    All other components within normal limits  URINE CULTURE  CULTURE, BLOOD (ROUTINE X 2)  CULTURE, BLOOD (ROUTINE X 2)  SARS CORONAVIRUS 2 (HOSPITAL ORDER, [facility])  LIPASE, BLOOD  LACTIC ACID, PLASMA  LACTIC ACID, PLASMA    EKG None  Radiology Ct Head Wo Contrast  Result Date: 10/20/2018 CLINICAL DATA:  85 year old male with headache weakness and fall. EXAM: CT HEAD WITHOUT CONTRAST TECHNIQUE: Contiguous axial images were obtained from the base of the  skull through the vertex without intravenous contrast. COMPARISON:  Brain MRI 04/19/2013. Head CT 10/15/2015. FINDINGS: Brain:  Stable cerebral volume. Patchy bilateral white matter hypodensity and a small area of right motor strip cortical encephalomalacia appears stable. No midline shift, ventriculomegaly, mass effect, evidence of mass lesion, intracranial hemorrhage or evidence of cortically based acute infarction. No other cortical encephalomalacia identified. There is chronic anterior temporal lobe volume loss. Vascular: Calcified atherosclerosis at the skull base. No suspicious intracranial vascular hyperdensity. Skull: No acute osseous abnormality identified. Sinuses/Orbits: Paranasal sinuses are well pneumatized overall. There is mild new left frontal and frontoethmoidal recess mucosal thickening and bubbly opacity. Tympanic cavities and mastoids are clear. Other: No acute orbit or scalp soft tissue findings. IMPRESSION: 1. No acute intracranial abnormality. No acute traumatic injury identified. 2. Stable non contrast CT appearance of mostly small vessel chronic ischemic disease. 3. Mild inflammatory paranasal sinus disease. Electronically Signed   By: Genevie Ann M.D.   On: 10/20/2018 11:54   Dg Chest Portable 1 View  Result Date: 10/20/2018 CLINICAL DATA:  Fever EXAM: PORTABLE CHEST 1 VIEW COMPARISON:  09/21/2018 FINDINGS: Severe interstitial fibrosis left greater than right is unchanged. No superimposed infiltrate or effusion. Negative for heart failure. IMPRESSION: Severe interstitial fibrosis, stable from the prior study. Electronically Signed   By: Franchot Gallo M.D.   On: 10/20/2018 11:39    Procedures Procedures (including critical care time)  Medications Ordered in ED Medications  cefTRIAXone (ROCEPHIN) 1 g in sodium chloride 0.9 % 100 mL IVPB (1 g Intravenous New Bag/Given 10/20/18 1301)  sodium chloride 0.9 % bolus 1,000 mL (1,000 mLs Intravenous New Bag/Given 10/20/18 1203)     Initial Impression / Assessment and Plan / ED Course  I have reviewed the triage vital signs and the nursing notes.  Pertinent labs  & imaging results that were available during my care of the patient were reviewed by me and considered in my medical decision making (see chart for details).        85 year old male with a history of AAA, hyperlipidemia, prostate cancer, CVA with left-sided weakness, pulmonary fibrosis with chronic respiratory failure on 3 L of oxygen at rest and 5 while walking, pulmonary emboli on Xarelto, carotid artery stenosis who presents with fever this morning, generalized weakness, foul smelling urine.  Given history of fall on Xarelto, CT head was done which showed no acute findings.  Patient is afebrile in the emergency department, but did receive antipyretics prior to presentation.  Given history reported by wife, ordered labs including blood cultures, lactic acid, urinalysis and x-ray.  Labs show mild AKI, normal lactic acid.  UA consistent with UTI, feel this is most likely etiology of symptoms. Wife reports severe generalized weakness at home and in setting of infection, will admit for continued hydration and treatment of infection.   Final Clinical Impressions(s) / ED Diagnoses   Final diagnoses:  Urinary tract infection without hematuria, site unspecified  Generalized weakness    ED Discharge Orders    None         Gareth Morgan, MD 10/20/18 1331

## 2018-10-21 ENCOUNTER — Other Ambulatory Visit: Payer: Self-pay | Admitting: Pulmonary Disease

## 2018-10-21 DIAGNOSIS — R531 Weakness: Secondary | ICD-10-CM | POA: Diagnosis not present

## 2018-10-21 DIAGNOSIS — N3001 Acute cystitis with hematuria: Secondary | ICD-10-CM | POA: Diagnosis not present

## 2018-10-21 LAB — BASIC METABOLIC PANEL
Anion gap: 8 (ref 5–15)
BUN: 22 mg/dL (ref 8–23)
CO2: 25 mmol/L (ref 22–32)
Calcium: 8.1 mg/dL — ABNORMAL LOW (ref 8.9–10.3)
Chloride: 111 mmol/L (ref 98–111)
Creatinine, Ser: 1.07 mg/dL (ref 0.61–1.24)
GFR calc Af Amer: >60 mL/min (ref >60)
GFR calc non Af Amer: >60 mL/min (ref >60)
Glucose, Bld: 82 mg/dL (ref 70–99)
Potassium: 4.3 mmol/L (ref 3.5–5.1)
Sodium: 144 mmol/L (ref 135–145)

## 2018-10-21 LAB — CBC
HCT: 29.8 % — ABNORMAL LOW (ref 39.0–52.0)
Hemoglobin: 9.4 g/dL — ABNORMAL LOW (ref 13.0–17.0)
MCH: 30.7 pg (ref 26.0–34.0)
MCHC: 31.5 g/dL (ref 30.0–36.0)
MCV: 97.4 fL (ref 80.0–100.0)
Platelets: 236 10*3/uL (ref 150–400)
RBC: 3.06 MIL/uL — ABNORMAL LOW (ref 4.22–5.81)
RDW: 14.2 % (ref 11.5–15.5)
WBC: 7.6 10*3/uL (ref 4.0–10.5)
nRBC: 0 % (ref 0.0–0.2)

## 2018-10-21 MED ORDER — CEPHALEXIN 500 MG PO CAPS
500.0000 mg | ORAL_CAPSULE | Freq: Two times a day (BID) | ORAL | Status: DC
  Administered 2018-10-21: 500 mg via ORAL
  Filled 2018-10-21: qty 1

## 2018-10-21 MED ORDER — CEPHALEXIN 500 MG PO CAPS: 500.0000 mg | ORAL_CAPSULE | Freq: Two times a day (BID) | ORAL | 0 refills | Status: DC

## 2018-10-21 NOTE — Evaluation (Signed)
Physical Therapy Evaluation Patient Details Name: [name] [name] MRN: [id number] DOB: 04-07-1929 Today's Date: 10/21/2018   History of Present Illness  Pt is an 85 yo male s/p a week and a hlaf prgressive decline in mentation and weakness. Pt with 100* fever and slid OOB at home requiring W/C for mobility. 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  Clinical Impression  Pt admitted with above diagnosis. Pt currently with functional limitations due to the deficits listed below (see PT Problem List). Ambulates safely with RW at a min guard/supervision level for safety. Some decreased safety awareness with O2 and IV lines unless cued. Impulsive to stand at times. Recommend HHPT follow-up to insure home safety. SpO2 required 5L while ambulating to maintain safe range. 2L at rest >95%. Will continue to follow until d/c. Pt will benefit from skilled PT to increase their independence and safety with mobility to allow discharge to the venue listed below.       Follow Up Recommendations No PT follow up;Supervision - Intermittent    Equipment Recommendations  None recommended by PT    Recommendations for Other Services       Precautions / Restrictions Precautions Precautions: Fall;Other (comment) Precaution Comments: O2 3L at baseline, 5L with exertion Restrictions Weight Bearing Restrictions: No      Mobility  Bed Mobility Overal bed mobility: Modified Independent Bed Mobility: Sidelying to Sit;Sit to Sidelying   Sidelying to sit: Supervision     Sit to sidelying: Supervision General bed mobility comments: Extra time but capable of perfomring without assistance  Transfers Overall transfer level: Needs assistance Equipment used: Rolling walker (2 wheeled) Transfers: Sit to/from Stand Sit to Stand: Min guard Stand pivot transfers: Min guard       General transfer comment: min guard for safety, steady  with RW for support. Cues for hand placement. Performed without RW from [facility] safely.  Ambulation/Gait Ambulation/Gait assistance: Supervision Gait Distance (Feet): 150 Feet Assistive device: Rolling walker (2 wheeled);None Gait Pattern/deviations: Step-through pattern;Decreased stride length Gait velocity: decreased Gait velocity interpretation: 1.31 - 2.62 ft/sec, indicative of limited community ambulator General Gait Details: Decreased gait speed, using RW lightly for support majority of distance, no overt instability. Without device pt without significant instability however decreased awareness of lines and needed redirection and awareness cues. SpO2  rapidly decreased down to 75% on 3L while ambulating, quickly returns to 98% on 5L.   Stairs            Wheelchair Mobility    Modified Rankin (Stroke Patients Only)       Balance Overall balance assessment: Mild deficits observed, not formally tested                                           Pertinent Vitals/Pain Pain Assessment: No/denies pain    Home Living Family/patient expects to be discharged to:: Private residence Living Arrangements: Spouse/significant other Available Help at Discharge: Family;Available 24 hours/day Type of Home: House Home Access: Level entry     Home Layout: One level Home Equipment: Grab bars - tub/shower;Wheelchair - [employer] - 2 wheels;Bedside commode      Prior Function Level of Independence: Independent with assistive device(s)         Comments: (different story this AM from his report with OT this morning. States  he does not use assistive device and is independent with ADLs PTA.) Possible confusion as he is not aware of why he is in hosptial.)     Hand Dominance   Dominant Hand: Right    Extremity/Trunk Assessment   Upper Extremity Assessment Upper Extremity Assessment: Defer to OT evaluation    Lower Extremity Assessment Lower Extremity  Assessment: Generalized weakness    Cervical / Trunk Assessment Cervical / Trunk Assessment: Normal  Communication   Communication: No difficulties  Cognition Arousal/Alertness: Awake/alert Behavior During Therapy: WFL for tasks assessed/performed Overall Cognitive Status: Impaired/Different from baseline Area of Impairment: Orientation;Safety/judgement                 Orientation Level: Disoriented to;Situation       Safety/Judgement: Decreased awareness of safety(impulsive)            General Comments General comments (skin integrity, edema, etc.): Sats 97% on 2L at rest. SpO2 rapid decline to 75% with gait on 3L, up to 98% on 5L. Attempted BM on BSC without luck.    Exercises     Assessment/Plan    PT Assessment Patient needs continued PT services  PT Problem List Decreased strength;Decreased activity tolerance;Decreased balance;Decreased mobility;Decreased cognition;Decreased knowledge of use of DME;Decreased safety awareness;Cardiopulmonary status limiting activity       PT Treatment Interventions DME instruction;Gait training;Functional mobility training;Therapeutic activities;Therapeutic exercise;Neuromuscular re-education;Balance training;Cognitive remediation;Patient/family education    PT Goals (Current goals can be found in the Care Plan section)  Acute Rehab PT Goals Patient Stated Goal: to go home PT Goal Formulation: With patient Time For Goal Achievement: 11/04/18 Potential to Achieve Goals: Good    Frequency Min 3X/week   Barriers to discharge        Co-evaluation               AM-PAC PT "6 Clicks" Mobility  Outcome Measure Help needed turning from your back to your side while in a flat bed without using bedrails?: None Help needed moving from lying on your back to sitting on the side of a flat bed without using bedrails?: None Help needed moving to and from a bed to a chair (including a wheelchair)?: A Little Help needed standing  up from a chair using your arms (e.g., wheelchair or bedside chair)?: A Little Help needed to walk in hospital room?: A Little Help needed climbing 3-5 steps with a railing? : A Lot 6 Click Score: 19    End of Session Equipment Utilized During Treatment: Gait belt;Oxygen Activity Tolerance: Patient tolerated treatment well Patient left: in bed;with call bell/phone within reach;with chair alarm set Nurse Communication: Mobility status PT Visit Diagnosis: Muscle weakness (generalized) (M62.81);Difficulty in walking, not elsewhere classified (R26.2)    Time: 4650-3546 PT Time Calculation (min) (ACUTE ONLY): 53 min   Charges:   PT Evaluation $PT Eval Moderate Complexity: 1 Mod PT Treatments $Gait Training: 23-37 mins $Therapeutic Activity: 8-22 mins        Elayne Snare, PT, DPT  Ellouise Newer 10/21/2018, 10:13 AM

## 2018-10-21 NOTE — Evaluation (Signed)
Occupational Therapy Evaluation Patient Details Name: [name] [name] MRN: [id number] DOB: 06-May-1929 Today's Date: 10/21/2018    History of Present Illness Pt is an 85 yo male s/p a week and a hlaf prgressive decline in mentation and weakness. Pt with 100* fever and slid OOB at home requiring W/C for mobility. 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   Clinical Impression   Pt PTA: pt living with spouse and reports independence with ADL and mobility usually with no AD, but recently using RW and W/C. Pt currently limited by activity tolerance and generalized weakness. Pt following commands and A/O x3, confused about situation stating "I just woke up here." Pt performing ADL with set-upA to minguardA. Pt performing transfers and mobility with RW and minguardA verbal cues to avoid line management. O2 >90% on 5L with exertion. Pt does desat to 80% on 3L O2 with exertion. Pt would benefit from continued OT skilled services for ADL, mobility and safety in [facility] setting. OT following acutely.      Follow Up Recommendations  Home health OT;Supervision/Assistance - 24 hour    Equipment Recommendations  None recommended by OT    Recommendations for Other Services       Precautions / Restrictions Precautions Precautions: Fall;Other (comment) Precaution Comments: O2 3L at baseline, 5L with exertion Restrictions Weight Bearing Restrictions: No      Mobility Bed Mobility Overal bed mobility: Needs Assistance Bed Mobility: Sidelying to Sit;Sit to Sidelying   Sidelying to sit: Supervision     Sit to sidelying: Supervision General bed mobility comments: cues to avoid lines  Transfers Overall transfer level: Needs assistance Equipment used: Rolling walker (2 wheeled) Transfers: Sit to/from [facility] Sit to Stand: Min guard Stand pivot transfers: Min guard       General transfer comment:  assist for safety    Balance Overall balance assessment: Mild deficits observed, not formally tested                                         ADL either performed or assessed with clinical judgement   ADL Overall ADL's : Needs assistance/impaired Eating/Feeding: Set up;Sitting   Grooming: Set up;Sitting   Upper Body Bathing: Set up;Sitting   Lower Body Bathing: Min guard;Sitting/lateral leans;Sit to/from stand;Cueing for safety;Cueing for sequencing   Upper Body Dressing : Set up;Sitting   Lower Body Dressing: Min guard;Sitting/lateral leans;Sit to/from stand;Cueing for safety;Cueing for sequencing   Toilet Transfer: Min guard;Ambulation;Grab bars;RW   Toileting- Water quality scientist and Hygiene: Min guard;Cueing for safety;Cueing for sequencing;Sitting/lateral lean;Sit to/from stand       Functional mobility during ADLs: Min guard;Rolling walker;Cueing for safety;Cueing for sequencing General ADL Comments: set-upA for UB ADL and minguardA with LB ADL     Vision Baseline Vision/History: No visual deficits Vision Assessment?: No apparent visual deficits     Perception     Praxis      Pertinent Vitals/Pain Pain Assessment: No/denies pain     Hand Dominance Right   Extremity/Trunk Assessment Upper Extremity Assessment Upper Extremity Assessment: Generalized weakness   Lower Extremity Assessment Lower Extremity Assessment: Generalized weakness   Cervical / Trunk Assessment Cervical / Trunk Assessment: Normal   Communication Communication Communication: No difficulties   Cognition Arousal/Alertness: Awake/alert Behavior During Therapy: WFL for tasks assessed/performed Overall Cognitive Status:  Within Functional Limits for tasks assessed                                     General Comments  O2 sats desat to 80% on 3L O2 after exertion, pt returned to 5L and quickly resumed to >90% O2. Pt appears close to baseline functioning.  Pt a little confused over the lines and O2 wires so pt requires redirection to avoid pulling on them    Exercises     Shoulder Instructions      Home Living Family/patient expects to be discharged to:: Private residence Living Arrangements: Spouse/significant other Available Help at Discharge: Family;Available 24 hours/day Type of Home: House Home Access: Level entry     Home Layout: One level     Bathroom Shower/Tub: Occupational psychologist: Standard     Home Equipment: Grab bars - tub/shower;Wheelchair - [employer] - 2 wheels;Bedside commode          Prior Functioning/Environment Level of Independence: Independent with assistive device(s)        Comments: Pt was requiring w/c for mobility. Pt reports supervision for bathing dressing tasks        OT Problem List: Decreased activity tolerance;Decreased safety awareness      OT Treatment/Interventions: Self-care/ADL training;Energy conservation;Therapeutic exercise;Neuromuscular education;Therapeutic activities;Patient/family education;Balance training    OT Goals(Current goals can be found in the care plan section) Acute Rehab OT Goals Patient Stated Goal: to go home OT Goal Formulation: With patient Time For Goal Achievement: 11/04/18 Potential to Achieve Goals: Good ADL Goals Pt Will Perform Lower Body Dressing: with modified independence;sit to/from stand Pt Will Perform Toileting - Clothing Manipulation and hygiene: with modified independence;sit to/from stand Additional ADL Goal #1: Pt will perform OOB ADL with modified independence requiring 1 safety cue.  OT Frequency: Min 2X/week   Barriers to D/C:            Co-evaluation              AM-PAC OT "6 Clicks" Daily Activity     Outcome Measure Help from another person eating meals?: None Help from another person taking care of personal grooming?: A Little Help from another person toileting, which includes using toliet, bedpan, or  urinal?: A Little Help from another person bathing (including washing, rinsing, drying)?: A Little Help from another person to put on and taking off regular upper body clothing?: A Little Help from another person to put on and taking off regular lower body clothing?: A Little 6 Click Score: 19   End of Session Equipment Utilized During Treatment: Gait belt;Rolling walker Nurse Communication: Mobility status;Other (comment)(nurse to look at IV site)  Activity Tolerance: Patient tolerated treatment well Patient left: in bed;with call bell/phone within reach;with bed alarm set  OT Visit Diagnosis: Unsteadiness on feet (R26.81);Muscle weakness (generalized) (M62.81)                Time: 5465-6812 OT Time Calculation (min): 33 min Charges:  OT General Charges $OT Visit: 1 Visit OT Evaluation $OT Eval Moderate Complexity: 1 Mod OT Treatments $Self Care/Home Management : 8-22 mins  Ebony Hail Harold Hedge) Marsa Aris OTR/L Acute Rehabilitation Services Pager: [phone] Office: [place] 10/21/2018, 8:23 AM

## 2018-10-21 NOTE — Care Management (Signed)
Reviewed PT OT notes. Spoke w wife. She states that they have used their [place] allowance, and were in the process of getting set up with OP services but a referral was made to [facility] rehab that was too far for them to get to. CM placed referral for [place]. Wife very grateful for referral and provided with number to call. Declined need for DME.

## 2018-10-21 NOTE — Plan of Care (Signed)
Acute rehab goals established. 

## 2018-10-21 NOTE — Discharge Summary (Signed)
Physician Discharge Summary  [name] [name] WUX:[id number] DOB: 1929/09/23 DOA: 10/20/2018  PCP: Kelton Pillar, MD  Admit date: 10/20/2018 Discharge date: 10/21/2018  Admitted From: home Discharge disposition: home   Recommendations for Outpatient Follow-Up:   1. Culture of urine still pending-- please follow up to ensure patient on correct abx 2. Referral placed for outpatient PT   Discharge Diagnosis:   Principal Problem:   UTI (urinary tract infection) Active Problems:   AAA (abdominal aortic aneurysm) without rupture (HCC)   CAD (coronary artery disease)   Mild cognitive impairment   History of stroke   IPF (idiopathic pulmonary fibrosis) (HCC)   Chronic hypoxemic respiratory failure (HCC)   GERD (gastroesophageal reflux disease)    Discharge Condition: Improved.  Diet recommendation:   Regular.  Wound care: None.  Code status: Full.   History of Present Illness:  [name] [name] is a 85 y.o. male with medical history significant of abdominal aortic aneurysm, hyperlipidemia, prostate cancer cancer, old stroke with left-sided weakness, pulmonary fibrosis with chronic respiratory failure on 3 L of oxygen at rest and 5 L while walking, pulmonary emboli on Xarelto, carotid artery stenosis who presents with fever and generalized weakness with associated foul-smelling urine.  Patient was seen in the emergency department he was not exactly sure why he is here.  Wife states that he has had a couple days of generalized weakness with fatigue.  This morning he spiked a fever to 100 degrees and she became concerned that he might have a urinary infection.  Morning while going to the bathroom he slid out of the bed and was assisted by his wife.  He did not strike his head or hurt anything but this is not uncommon for him.  Patient has mild dementia and sometimes will initiate conversation but frequently is very quiet.  His symptoms vary in terms of his degree of alertness and oxygen  requirements.  Reports that his hands have been tremulous and he has not been focusing well.  Is much less alert than normal.  Wife says she is not sure if his thermometer was accurate this morning when it registered a temperature of 100 degrees but he felt hot. Patient denies cough, nausea, vomiting, diarrhea, chest pain, or new dyspnea.  He has no change in his pulmonary fibrosis cough or shortness of breath.  He did not eat well yesterday.  He has been drinking acceptably today and yesterday.  He did not eat breakfast prior to coming to the emergency department today.  Wife said she was unable to get him off the floor on her own and was very concerned about taking him home and therefore requests admission to the hospital.   Hospital Course by Problem:   1.  Urinary tract infection with associated weakness: Received a dose of Rocephin in the emergency department  -culture with gram negative bacteria-- this will need to be followed to resolution -PO abx  2.  Idiopathic pulmonary fibrosis: Patient's usual dose of oxygen is 3 L but 5 L when he ambulates.  He is doing fairly well with this.  We will continue current plan.  3.  Mild cognitive impairment: continue supportive care.  4.  Chronic hypoxic respiratory failure: continue oxygen supplementation.  5.  Abdominal aortic aneurysm without rupture: continue blood pressure management.  6.  Coronary artery disease: continue home medication management.        Medical Consultants:      Discharge Exam:   Vitals:  10/20/18 2331 10/21/18 0812  BP: 128/79 126/71  Pulse: 66 73  Resp: 16 18  Temp: 97.7 F (36.5 C)   SpO2: 100% 94%   Vitals:   10/20/18 1657 10/20/18 2033 10/20/18 2331 10/21/18 0812  BP: (!) 150/90 (!) 141/76 128/79 126/71  Pulse: 75 65 66 73  Resp: 18 18 16 18   Temp: (!) 97.5 F (36.4 C) (!) 97.4 F (36.3 C) 97.7 F (36.5 C)   TempSrc: Oral Oral Oral   SpO2: 100% 98% 100% 94%    General exam:  Appears calm and comfortable.   The results of significant diagnostics from this hospitalization (including imaging, microbiology, ancillary and laboratory) are listed below for reference.     Procedures and Diagnostic Studies:   Ct Head Wo Contrast  Result Date: 10/20/2018 CLINICAL DATA:  85 year old male with headache weakness and fall. EXAM: CT HEAD WITHOUT CONTRAST TECHNIQUE: Contiguous axial images were obtained from the base of the skull through the vertex without intravenous contrast. COMPARISON:  Brain MRI 04/19/2013. Head CT 10/15/2015. FINDINGS: Brain: Stable cerebral volume. Patchy bilateral white matter hypodensity and a small area of right motor strip cortical encephalomalacia appears stable. No midline shift, ventriculomegaly, mass effect, evidence of mass lesion, intracranial hemorrhage or evidence of cortically based acute infarction. No other cortical encephalomalacia identified. There is chronic anterior temporal lobe volume loss. Vascular: Calcified atherosclerosis at the skull base. No suspicious intracranial vascular hyperdensity. Skull: No acute osseous abnormality identified. Sinuses/Orbits: Paranasal sinuses are well pneumatized overall. There is mild new left frontal and frontoethmoidal recess mucosal thickening and bubbly opacity. Tympanic cavities and mastoids are clear. Other: No acute orbit or scalp soft tissue findings. IMPRESSION: 1. No acute intracranial abnormality. No acute traumatic injury identified. 2. Stable non contrast CT appearance of mostly small vessel chronic ischemic disease. 3. Mild inflammatory paranasal sinus disease. Electronically Signed   By: Genevie Ann M.D.   On: 10/20/2018 11:54   Dg Chest Portable 1 View  Result Date: 10/20/2018 CLINICAL DATA:  Fever EXAM: PORTABLE CHEST 1 VIEW COMPARISON:  09/21/2018 FINDINGS: Severe interstitial fibrosis left greater than right is unchanged. No superimposed infiltrate or effusion. Negative for heart failure.  IMPRESSION: Severe interstitial fibrosis, stable from the prior study. Electronically Signed   By: Franchot Gallo M.D.   On: 10/20/2018 11:39     Labs:   Basic Metabolic Panel: Recent Labs  Lab 10/20/18 1154 10/21/18 0515  NA 143 144  K 3.9 4.3  CL 106 111  CO2 27 25  GLUCOSE 113* 82  BUN 17 22  CREATININE 1.35* 1.07  CALCIUM 8.8* 8.1*   GFR CrCl cannot be calculated (Unknown ideal weight.). Liver Function Tests: Recent Labs  Lab 10/20/18 1154  AST 73*  ALT 85*  ALKPHOS 103  BILITOT 0.8  PROT 7.2  ALBUMIN 2.6*   Recent Labs  Lab 10/20/18 1154  LIPASE 29   No results for input(s): AMMONIA in the last 168 hours. Coagulation profile No results for input(s): INR, PROTIME in the last 168 hours.  CBC: Recent Labs  Lab 10/20/18 1154 10/21/18 0515  WBC 10.2 7.6  NEUTROABS 7.6  --   HGB 10.8* 9.4*  HCT 34.0* 29.8*  MCV 96.9 97.4  PLT 266 236   Cardiac Enzymes: No results for input(s): CKTOTAL, CKMB, CKMBINDEX, TROPONINI in the last 168 hours. BNP: Invalid input(s): POCBNP CBG: No results for input(s): GLUCAP in the last 168 hours. D-Dimer No results for input(s): DDIMER in the last 72 hours. Hgb  A1c No results for input(s): HGBA1C in the last 72 hours. Lipid Profile No results for input(s): CHOL, HDL, LDLCALC, TRIG, CHOLHDL, LDLDIRECT in the last 72 hours. Thyroid function studies No results for input(s): TSH, T4TOTAL, T3FREE, THYROIDAB in the last 72 hours.  Invalid input(s): FREET3 Anemia work up No results for input(s): VITAMINB12, FOLATE, FERRITIN, TIBC, IRON, RETICCTPCT in the last 72 hours. Microbiology Recent Results (from the past 240 hour(s))  Urine culture     Status: Abnormal (Preliminary result)   Collection Time: 10/20/18 11:58 AM   Specimen: Urine, Catheterized  Result Value Ref Range Status   Specimen Description URINE, CATHETERIZED  Final   Special Requests   Final    NONE Performed at [facility], [address]  [place], [place] [address]    Culture >=100,000 COLONIES/mL GRAM NEGATIVE RODS (A)  Final   Report Status PENDING  Incomplete  Blood culture (routine x 2)     Status: None (Preliminary result)   Collection Time: 10/20/18 12:06 PM   Specimen: BLOOD  Result Value Ref Range Status   Specimen Description BLOOD LEFT ANTECUBITAL  Final   Special Requests   Final    BOTTLES DRAWN AEROBIC AND ANAEROBIC Blood Culture adequate volume   Culture   Final    NO GROWTH < 24 HOURS Performed at [facility], [place] [address], [place], [place] [address]    Report Status PENDING  Incomplete  Blood culture (routine x 2)     Status: None (Preliminary result)   Collection Time: 10/20/18 12:18 PM   Specimen: BLOOD  Result Value Ref Range Status   Specimen Description BLOOD SITE NOT SPECIFIED  Final   Special Requests   Final    BOTTLES DRAWN AEROBIC AND ANAEROBIC Blood Culture results may not be optimal due to an excessive volume of blood received in culture bottles   Culture   Final    NO GROWTH < 24 HOURS Performed at [facility], [place] [address], [place], [place] [address]    Report Status PENDING  Incomplete  SARS Coronavirus 2 [facility] order, Performed in [facility] hospital lab) Nasopharyngeal Nasopharyngeal Swab     Status: None   Collection Time: 10/20/18  2:02 PM   Specimen: Nasopharyngeal Swab  Result Value Ref Range Status   SARS Coronavirus 2 NEGATIVE NEGATIVE Final    Comment: (NOTE) If result is NEGATIVE SARS-CoV-2 target nucleic acids are NOT DETECTED. The SARS-CoV-2 RNA is generally detectable in upper and lower  respiratory specimens during the acute phase of infection. The lowest  concentration of SARS-CoV-2 viral copies this assay can detect is 250  copies / mL. A negative result does not preclude SARS-CoV-2 infection  and should not be used as the sole basis for treatment or other  patient management decisions.  A negative result may occur with  improper specimen  collection / handling, submission of specimen other  than nasopharyngeal swab, presence of viral mutation(s) within the  areas targeted by this assay, and inadequate number of viral copies  (<250 copies / mL). A negative result must be combined with clinical  observations, patient history, and epidemiological information. If result is POSITIVE SARS-CoV-2 target nucleic acids are DETECTED. The SARS-CoV-2 RNA is generally detectable in upper and lower  respiratory specimens dur ing the acute phase of infection.  Positive  results are indicative of active infection with SARS-CoV-2.  Clinical  correlation with patient history and other diagnostic information is  necessary  to determine patient infection status.  Positive results do  not rule out bacterial infection or co-infection with other viruses. If result is PRESUMPTIVE POSTIVE SARS-CoV-2 nucleic acids MAY BE PRESENT.   A presumptive positive result was obtained on the submitted specimen  and confirmed on repeat testing.  While 2019 novel coronavirus  (SARS-CoV-2) nucleic acids may be present in the submitted sample  additional confirmatory testing may be necessary for epidemiological  and / or clinical management purposes  to differentiate between  SARS-CoV-2 and other Sarbecovirus currently known to infect humans.  If clinically indicated additional testing with an alternate test  methodology [id number]) is advised. The SARS-CoV-2 RNA is generally  detectable in upper and lower respiratory sp ecimens during the acute  phase of infection. The expected result is Negative. Fact Sheet for Patients:  StrictlyIdeas.no Fact Sheet for Healthcare Providers: BankingDealers.co.za This test is not yet approved or cleared by the [place] FDA and has been authorized for detection and/or diagnosis of SARS-CoV-2 by FDA under an Emergency Use Authorization (EUA).  This EUA will remain in effect  (meaning this test can be used) for the duration of the COVID-19 declaration under Section 564(b)(1) of the Act, 21 U.S.C. section 360bbb-3(b)(1), unless the authorization is terminated or revoked sooner. Performed at [facility], [place] [address] [place], [place] [address]      Discharge Instructions:   Discharge Instructions    Diet general   Complete by: As directed    Discharge instructions   Complete by: As directed    Final culture is pending, please follow up with PCP to ensure antibiotic will treat   Increase activity slowly   Complete by: As directed      Allergies as of 10/21/2018      Reactions   Sudafed [pseudoephedrine Hcl]    Causes dizziness and nervousness   Atorvastatin Other (See Comments)   Reaction: Patient's family reports "confusion" and would prefer crestor       Medication List    TAKE these medications   acetaminophen 500 MG tablet Commonly known as: TYLENOL Take 1,000 mg by mouth every 6 (six) hours as needed for mild pain or fever.   azelastine 0.1 % nasal spray Commonly known as: ASTELIN Place 2 sprays into both nostrils 2 (two) times daily.   CENTRUM SILVER PO Take 1 tablet by mouth daily.   cephALEXin 500 MG capsule Commonly known as: KEFLEX Take 1 capsule (500 mg total) by mouth every 12 (twelve) hours.   Cerefolin 08-20-48-5 MG Tabs TAKE 1 TABLET BY MOUTH EVERY DAY   dextromethorphan-guaiFENesin 30-600 MG 12hr tablet Commonly known as: MUCINEX DM Take 1 tablet by mouth 2 (two) times daily.   FISH OIL PO Take 1 capsule by mouth daily.   loratadine 10 MG tablet Commonly known as: CLARITIN Take 10 mg by mouth daily.   pantoprazole 40 MG tablet Commonly known as: PROTONIX TAKE 1 TABLET DAILY,30 MINUTES BEFORE DINNER. What changed: See the new instructions.   predniSONE 10 MG tablet Commonly known as: DELTASONE TAKE 1 TABLET EVERY DAY WITH BREAKFAST. What changed: See the new instructions.   Resveratrol 100 MG  Caps Take 100 mg by mouth every evening.   rivaroxaban 20 MG Tabs tablet Commonly known as: XARELTO Take 1 tablet (20 mg total) by mouth daily with supper.   rosuvastatin 5 MG tablet Commonly known as: CRESTOR Take 5 mg by mouth at bedtime.         Time coordinating  discharge: 25 min  Signed:  Geradine Girt DO  [facility] 10/21/2018, 12:37 PM

## 2018-10-22 LAB — URINE CULTURE: Culture: >=100000 — AB

## 2018-10-25 LAB — CULTURE, BLOOD (ROUTINE X 2)
Culture: NO GROWTH
Culture: NO GROWTH
Special Requests: ADEQUATE

## 2018-10-30 DIAGNOSIS — Z87440 Personal history of urinary (tract) infections: Secondary | ICD-10-CM | POA: Diagnosis not present

## 2018-11-07 ENCOUNTER — Encounter

## 2018-12-08 DIAGNOSIS — R8290 Unspecified abnormal findings in urine: Secondary | ICD-10-CM | POA: Diagnosis not present

## 2018-12-15 ENCOUNTER — Other Ambulatory Visit: Payer: Self-pay | Admitting: Pulmonary Disease

## 2018-12-18 ENCOUNTER — Ambulatory Visit: Payer: Medicare Other | Admitting: Primary Care

## 2018-12-21 ENCOUNTER — Other Ambulatory Visit: Payer: Self-pay

## 2018-12-21 ENCOUNTER — Ambulatory Visit: Payer: Medicare Other | Admitting: Primary Care

## 2018-12-21 ENCOUNTER — Telehealth: Payer: Self-pay | Admitting: Cardiovascular Disease

## 2018-12-21 ENCOUNTER — Encounter: Payer: Self-pay | Admitting: Primary Care

## 2018-12-21 VITALS — BP 128/84 | HR 106 | Temp 97.0°F | Ht 70.0 in | Wt 150.0 lb

## 2018-12-21 DIAGNOSIS — J84112 Idiopathic pulmonary fibrosis: Secondary | ICD-10-CM

## 2018-12-21 DIAGNOSIS — I2699 Other pulmonary embolism without acute cor pulmonale: Secondary | ICD-10-CM | POA: Diagnosis not present

## 2018-12-21 DIAGNOSIS — Z23 Encounter for immunization: Secondary | ICD-10-CM | POA: Diagnosis not present

## 2018-12-21 DIAGNOSIS — I2694 Multiple subsegmental pulmonary emboli without acute cor pulmonale: Secondary | ICD-10-CM | POA: Diagnosis not present

## 2018-12-21 LAB — COMPREHENSIVE METABOLIC PANEL
ALT: 50 U/L (ref 0–53)
AST: 36 U/L (ref 0–37)
Albumin: 2.9 g/dL — ABNORMAL LOW (ref 3.5–5.2)
Alkaline Phosphatase: 89 U/L (ref 39–117)
BUN: 15 mg/dL (ref 6–23)
CO2: 29 mEq/L (ref 19–32)
Calcium: 9.2 mg/dL (ref 8.4–10.5)
Chloride: 102 mEq/L (ref 96–112)
Creatinine, Ser: 0.98 mg/dL (ref 0.40–1.50)
GFR: 72.32 mL/min (ref >60.00)
Glucose, Bld: 148 mg/dL — ABNORMAL HIGH (ref 70–99)
Potassium: 3.7 mEq/L (ref 3.5–5.1)
Sodium: 139 mEq/L (ref 135–145)
Total Bilirubin: 0.5 mg/dL (ref 0.2–1.2)
Total Protein: 7.6 g/dL (ref 6.0–8.3)

## 2018-12-21 LAB — CBC WITH DIFFERENTIAL/PLATELET
Basophils Absolute: 0 10*3/uL (ref 0.0–0.1)
Basophils Relative: 0.2 % (ref 0.0–3.0)
Eosinophils Absolute: 0.1 10*3/uL (ref 0.0–0.7)
Eosinophils Relative: 0.6 % (ref 0.0–5.0)
HCT: 33.5 % — ABNORMAL LOW (ref 39.0–52.0)
Hemoglobin: 10.6 g/dL — ABNORMAL LOW (ref 13.0–17.0)
Lymphocytes Relative: 26 % (ref 12.0–46.0)
Lymphs Abs: 3.4 10*3/uL (ref 0.7–4.0)
MCHC: 31.7 g/dL (ref 30.0–36.0)
MCV: 93 fl (ref 78.0–100.0)
Monocytes Absolute: 0.6 10*3/uL (ref 0.1–1.0)
Monocytes Relative: 4.3 % (ref 3.0–12.0)
Neutro Abs: 8.9 10*3/uL — ABNORMAL HIGH (ref 1.4–7.7)
Neutrophils Relative %: 68.9 % (ref 43.0–77.0)
Platelets: 305 10*3/uL (ref 150.0–400.0)
RBC: 3.6 Mil/uL — ABNORMAL LOW (ref 4.22–5.81)
RDW: 16.3 % — ABNORMAL HIGH (ref 11.5–15.5)
WBC: 12.9 10*3/uL — ABNORMAL HIGH (ref 4.0–10.5)

## 2018-12-21 MED ORDER — RIVAROXABAN 10 MG PO TABS: 10.0000 mg | ORAL_TABLET | Freq: Every day | ORAL | 0 refills | Status: DC

## 2018-12-21 NOTE — Progress Notes (Signed)
@Patient  ID: [name] [name], male    DOB: January 07, 1930, 85 y.o.   MRN: [id number]  Chief Complaint  Patient presents with  . Follow-up    Wife reports the patient is having syncope episodes. She notices this is happening when he stands up and he is not able to recall what happened. He is having increased falls. He is now using a walker. He uses 4L and his wife states he typically runs 90-94%. Currenlty taking Keflex for UTI. Has approx 5 more doses.     Referring provider: Kelton Pillar, MD  HPI: 85 year old male, former smoker. PMH significant for IPF, chronic hypoxemic respiratory respiratory failure, multiple subsegmental PE, pneumonia, GERD, stroke, triple AAA, CAD, carotid artery stenosis, cognitive decline/memory loss. Patient of Dr. Lake Bells, last seen by pulmonary NP on 09/21/18. Maintained on Xarelto for PE and Prednisone 10mg  daily for pulmonary fibrosis. CTA 5/28 showed subsegnemtal PE, significant progression of ILD and consolidation.   12/21/2018 Patient presents today for 3 month follow-up. Accompanied by wife, patient has cognitive impairment. He was diagnosed with PE in May 2020. Started on Xarelto15mg  BID x 21 days and currently on 20mg  daily. CTA showed overall low burden and no CT evidence of right heart strain. He has completed 4 months of active treatment with anticoagulation. Patient wife reports increasing falls, some unwitnessed. These events are happening more frequently. Wife states that it happens approximately 15 seconds after standing up. He does not lose consciousness but wife states he looks faint. He had head CT back in July showed No acute intracranial abnormality. No acute traumatic injury identified. Stable non contrast CT appearance of mostly small vessel chronic ischemic disease. He saw neurology July 1st, carotid ultrasound on 7/22 shows no significant restenosis of either carotid arteries. He was admitted to the hospital [facility] July for [facility] tract infection. Wife  reports that they can get bad.   Wife reports that patients breathing has been stable. CTA in May showed severe interstitial lung disease with significant progression since 2017. They elected not treat UIP with antifibrotics. Continue 10mg  daily prednisone. He always has some degree of cough. Using oxygen 4L, increased oxygen to 5L at home because of the falling. Denies pain. Not receiving physical therapy. They have nursing aid come to home a couple times a week.   Allergies  Allergen Reactions  . Sudafed [Pseudoephedrine Hcl]     Causes dizziness and nervousness  . Atorvastatin Other (See Comments)    Reaction: Patient's family reports "confusion" and would prefer crestor     Immunization History  Administered Date(s) Administered  . Fluad Quad(high Dose 65+) 12/21/2018  . Influenza, High Dose Seasonal PF 12/03/2015, 12/23/2016, 01/31/2018  . Influenza-Unspecified 12/21/2014  . Pneumococcal Conjugate-13 02/28/2014  . Pneumococcal Polysaccharide-23 03/23/1999    Past Medical History:  Diagnosis Date  . AAA (abdominal aortic aneurysm) ([place])   . Allergy   . Carotid artery stenosis   . Cognitive decline   . Diplopia   . GERD (gastroesophageal reflux disease)   . Hyperlipidemia   . Memory loss   . Prostate cancer ([place])   . Pulmonary fibrosis ([place])   . Spondylisthesis 2015   Spine  . Stroke ([place]) 04/18/13   some weakness left-returning strength  . TIA (transient ischemic attack)     Tobacco History: Social History   Tobacco Use  Smoking Status Former Smoker  . Packs/day: 1.00  . Years: 14.00  . Pack years: 14.00  . Types: Cigarettes  . Quit  date: 03/27/1965  . Years since quitting: 53.7  Smokeless Tobacco Never Used   Counseling given: Not Answered   Outpatient Medications Prior to Visit  Medication Sig Dispense Refill  . acetaminophen (TYLENOL) 500 MG tablet Take 1,000 mg by mouth every 6 (six) hours as needed for mild pain or fever.     Marland Kitchen azelastine (ASTELIN) 0.1  % nasal spray Place 2 sprays into both nostrils 2 (two) times daily.   4  . cephALEXin (KEFLEX) 500 MG capsule Take 1 capsule (500 mg total) by mouth every 12 (twelve) hours. 10 capsule 0  . dextromethorphan-guaiFENesin (MUCINEX DM) 30-600 MG 12hr tablet Take 1 tablet by mouth 2 (two) times daily.     Marland Kitchen L-Methylfolate-B12-B6-B2 (CEREFOLIN) 08-20-48-5 MG TABS TAKE 1 TABLET BY MOUTH EVERY DAY 90 each 3  . loratadine (CLARITIN) 10 MG tablet Take 10 mg by mouth daily.    . Multiple Vitamins-Minerals (CENTRUM SILVER PO) Take 1 tablet by mouth daily.     . Omega-3 Fatty Acids (FISH OIL PO) Take 1 capsule by mouth daily.    . pantoprazole (PROTONIX) 40 MG tablet TAKE 1 TABLET DAILY,30 MINUTES BEFORE DINNER. 30 tablet 3  . predniSONE (DELTASONE) 10 MG tablet TAKE 1 TABLET EVERY DAY WITH BREAKFAST. 30 tablet 2  . Resveratrol 100 MG CAPS Take 100 mg by mouth every evening.     . rosuvastatin (CRESTOR) 5 MG tablet Take 5 mg by mouth at bedtime.    . rivaroxaban (XARELTO) 20 MG TABS tablet Take 1 tablet (20 mg total) by mouth daily with supper. 30 tablet 3   Facility-Administered Medications Prior to Visit  Medication Dose Route Frequency Provider Last Rate Last Dose  . aspirin EC tablet 81 mg  81 mg Oral Daily Garvin Fila, MD        Review of Systems  Review of Systems  Constitutional: Negative.   HENT: Negative.   Respiratory: Positive for cough. Negative for shortness of breath and wheezing.   Neurological: Positive for syncope and light-headedness.   Physical Exam  BP 128/84   Pulse (!) 106   Temp (!) 97 F (36.1 C) (Temporal)   Ht 5\' 10"  (1.778 m)   Wt 150 lb (68 kg)   SpO2 97%   BMI 21.52 kg/m  Physical Exam Constitutional:      Appearance: Normal appearance.  HENT:     Head: Normocephalic and atraumatic.  Eyes:     Extraocular Movements: Extraocular movements intact.     Pupils: Pupils are equal, round, and reactive to light.  Cardiovascular:     Rate and Rhythm:  Tachycardia present.  Pulmonary:     Breath sounds: Rales present. No wheezing.     Comments: Coarse crackles throughout  4L O2 97% Musculoskeletal:     Comments: Amb with rolling walker  Skin:    Comments: Thin skin, abrasion and bruising   Neurological:     General: No focal deficit present.     Mental Status: He is alert. Mental status is at baseline.     Comments: Playing games on his phone  Psychiatric:        Mood and Affect: Mood normal.        Behavior: Behavior normal.      Lab Results:  CBC    Component Value Date/Time   WBC 12.9 (H) 12/21/2018 1207   RBC 3.60 (L) 12/21/2018 1207   HGB 10.6 (L) 12/21/2018 1207   HCT 33.5 (L) 12/21/2018 1207  PLT 305.0 12/21/2018 1207   MCV 93.0 12/21/2018 1207   MCH 30.7 10/21/2018 0515   MCHC 31.7 12/21/2018 1207   RDW 16.3 (H) 12/21/2018 1207   LYMPHSABS 3.4 12/21/2018 1207   MONOABS 0.6 12/21/2018 1207   EOSABS 0.1 12/21/2018 1207   BASOSABS 0.0 12/21/2018 1207    BMET    Component Value Date/Time   NA 139 12/21/2018 1207   K 3.7 12/21/2018 1207   CL 102 12/21/2018 1207   CO2 29 12/21/2018 1207   GLUCOSE 148 (H) 12/21/2018 1207   BUN 15 12/21/2018 1207   CREATININE 0.98 12/21/2018 1207   CALCIUM 9.2 12/21/2018 1207   GFRNONAA >60 10/21/2018 0515   GFRAA >60 10/21/2018 0515    BNP No results found for: BNP  ProBNP No results found for: PROBNP  Imaging: No results found.   Assessment & Plan:   Multiple subsegmental pulmonary emboli without acute cor pulmonale - D/t increased falls recommend stopping or decreasing Xarelto dose. Family has elected to continue Xarelto at a lower dose.  - Plan decrease Xarelto to 10mg  daily until he can follow-up with Dr. Vaughan Browner in two weeks - Needs Echocardiogram   - Labs showed stable hemoglobin and platelets wnl  IPF (idiopathic pulmonary fibrosis) ([place]) - Breathing is stable; increased weakness  - CTA in May showed severe interstitial lung disease with significant  progression since 2017 - Family has elected not to treat with antifibrotics - Continues prednisone 10mg  daily    Martyn Ehrich, NP 12/22/2018

## 2018-12-21 NOTE — Patient Instructions (Addendum)
Plan: Decrease xarelto to 10mg  daily until next follow-up (we may decide to discontinue all together d/t falls and risk of bleeding)  Orders: - Labs today - Echocardiogram re: pulmonary embolism  - No CXR today needed  Follow-up: - 2 weeks with Dr. Vaughan Browner (IPF and PE) - Please call cardiology and make an apt with them- let them know about the syncopal episodes

## 2018-12-21 NOTE — Telephone Encounter (Signed)
New Message  Patient has just been diagnosed with pulmonary fibrosis and is now having fainting episodes where he is fainting/falling daily. Patient's wife states that the Xarelto has been decreased to 10 mg a day.   Patient has been scheduled for an appointment on 01/05/19 at 3:15 pm.   Please give patient a call to discuss.

## 2018-12-21 NOTE — Telephone Encounter (Signed)
Was told to transfer message to Michaelyn Barter by Triage.

## 2018-12-21 NOTE — Telephone Encounter (Signed)
Called patient's wife (DPR) back. Patient is having episodes of fainting and falling when standing up and moving around. Patient has not had an episode today, because someone has been with him constantly. Patient's wife stated that she knows when patient is going to faint when he stops moving and gets a funny look on his face. Patient has an appointment with Dr. Johnsie Cancel on 10/16. Will forward to Dr. Johnsie Cancel to see if he wants to order any test in the mean time and any other advisement.

## 2018-12-21 NOTE — Telephone Encounter (Signed)
Not clear that this has anything to do with his heart should come to office for echo next week and ECG He has severe dementia and lung disease and should see his primary and pulmonary first

## 2018-12-22 NOTE — Assessment & Plan Note (Addendum)
-   D/t increased falls recommend stopping or decreasing Xarelto dose. Family has elected to continue Xarelto at a lower dose.  - Plan decrease Xarelto to 10mg  daily until he can follow-up with Dr. Vaughan Browner in two weeks - Needs Echocardiogram   - Labs showed stable hemoglobin 10.6 and platelets wnl

## 2018-12-22 NOTE — Assessment & Plan Note (Addendum)
-   Breathing is stable; increased weakness  - CTA in May showed severe interstitial lung disease with significant progression since 2017 - Family has elected not to treat with antifibrotics - Continues prednisone 10mg  daily

## 2018-12-22 NOTE — Progress Notes (Signed)
Please let patient know his labs were mostly ok. White blood cells were slightly elevated but he had not signs of infection during visit- monitor for fever, UTI symptoms or cough. Platelets were normal and hemoglobin was 10.6 which is stable. Albumin is low 2.9 but stable from 2 months ago.

## 2018-12-22 NOTE — Progress Notes (Signed)
Called spoke with patient, advised of lab results / recs as stated by Volanda Napoleon NP.  Pt verbalized understanding and denied any questions.

## 2018-12-22 NOTE — Telephone Encounter (Signed)
Called patient's wife (DPR) with Dr. Kyla Balzarine recommendations. Patient already has Echo scheduled for Monday. Patient will have nurse visit for EKG right before his echo test.

## 2018-12-25 ENCOUNTER — Ambulatory Visit (HOSPITAL_COMMUNITY): Payer: Medicare Other | Attending: Cardiology

## 2018-12-25 ENCOUNTER — Ambulatory Visit (INDEPENDENT_AMBULATORY_CARE_PROVIDER_SITE_OTHER): Payer: Medicare Other

## 2018-12-25 ENCOUNTER — Other Ambulatory Visit: Payer: Self-pay

## 2018-12-25 VITALS — BP 122/68 | HR 88 | Ht 70.0 in | Wt 150.0 lb

## 2018-12-25 DIAGNOSIS — I2699 Other pulmonary embolism without acute cor pulmonale: Secondary | ICD-10-CM

## 2018-12-25 DIAGNOSIS — R55 Syncope and collapse: Secondary | ICD-10-CM | POA: Diagnosis not present

## 2018-12-25 DIAGNOSIS — R296 Repeated falls: Secondary | ICD-10-CM | POA: Diagnosis not present

## 2018-12-25 LAB — ECHOCARDIOGRAM COMPLETE
Height: 70 in
Weight: 2400 oz

## 2018-12-25 MED ORDER — PERFLUTREN LIPID MICROSPHERE
1.0000 mL | INTRAVENOUS | Status: AC | PRN
  Administered 2018-12-25: 2 mL via INTRAVENOUS

## 2018-12-25 NOTE — Progress Notes (Signed)
1.) Reason for visit: EKG  2.) Name of MD requesting visit: Dr Johnsie Cancel  3.) H&P: see chart  4.) ROS related to problem: Patient having episodes of fainting and falling;  EKG requested  5.) Assessment and plan per MD: Continue monitoring per Dr Burt Knack (DOD)

## 2018-12-29 NOTE — Progress Notes (Signed)
You are seeing on 10/15 for PE follow-up

## 2019-01-02 ENCOUNTER — Other Ambulatory Visit: Payer: Self-pay

## 2019-01-02 ENCOUNTER — Telehealth: Payer: Self-pay | Admitting: Pulmonary Disease

## 2019-01-02 DIAGNOSIS — Z20822 Contact with and (suspected) exposure to covid-19: Secondary | ICD-10-CM

## 2019-01-02 NOTE — Telephone Encounter (Signed)
Spoke with patient's wife.  She states patient was told by PCP to get covid tested, so they went today. They have mychart and changed patient to a mychart visit. Patient's wife thinks it might be a kidney stone. Patient hasn't had fever today but did yesterday 01/01/19.   I let patient know per our covid screening, waiting test results we do not allow patients in office until results are received. Patient's wife states they went to [employer] testing.  Nothing further needed at this time

## 2019-01-04 ENCOUNTER — Encounter: Payer: Self-pay | Admitting: Pulmonary Disease

## 2019-01-04 ENCOUNTER — Other Ambulatory Visit: Payer: Self-pay | Admitting: Emergency Medicine

## 2019-01-04 ENCOUNTER — Telehealth (INDEPENDENT_AMBULATORY_CARE_PROVIDER_SITE_OTHER): Payer: Medicare Other | Admitting: Pulmonary Disease

## 2019-01-04 DIAGNOSIS — K5900 Constipation, unspecified: Secondary | ICD-10-CM | POA: Diagnosis not present

## 2019-01-04 DIAGNOSIS — J84112 Idiopathic pulmonary fibrosis: Secondary | ICD-10-CM

## 2019-01-04 DIAGNOSIS — M549 Dorsalgia, unspecified: Secondary | ICD-10-CM | POA: Diagnosis not present

## 2019-01-04 LAB — NOVEL CORONAVIRUS, NAA: SARS-CoV-2, NAA: NOT DETECTED

## 2019-01-04 MED ORDER — RIVAROXABAN 10 MG PO TABS: 10.0000 mg | ORAL_TABLET | Freq: Every day | ORAL | 5 refills | Status: AC

## 2019-01-04 NOTE — Progress Notes (Signed)
Virtual Video Visit via Telephone Note  I connected with [name] [name] on 01/04/19 at 11:30 AM EDT by telephone and verified that I am speaking with the correct person using two identifiers.  Location: Patient: Home Provider: [facility], [place], [place]   I discussed the limitations, risks, security and privacy concerns of performing an evaluation and management service by telephone and the availability of in person appointments. I also discussed with the patient that there may be a patient responsible charge related to this service. The patient expressed understanding and agreed to proceed.  History of Present Illness: 85 year old with IPF, chronic respiratory failure, PE, pneumonia, GERD, stroke, AAA, coronary artery disease.  Previously patient of Dr. Lenna [name].  Diagnosed with IPF from 2017.  After extensive discussion with the family they have decided not to pursue anti-fibrotics.  On discussion with patient's wife they had a friend who decompensated quickly after being started on anti-fibrotics for IPF and did not want to go through with a similar course.  Maintained on prednisone at 10 mg. Diagnosed with pulmonary embolism on 08/17/2018.  Maintained on Xarelto 20 mg  Observations/Objective: He has been having some frequent falls and syncopal episodes Xarelto dose reduced to 10 mg with improvement in these episodes Continues to have severe dyspnea on exertion, desaturation, fatigue Continues on supplemental oxygen at 4 L  Echocardiogram 12/25/2018- LVEF 50-55%, moderate pulmonary hypertension.  RV systolic pressure 56.  Mild RV enlargement with normal RV systolic function.  CT 08/17/2018- small bilateral segmental and subsegmental pulmonary embolism.  Severe interstitial lung disease with significant progression fibrosis since 2017.  Assessment and Plan: IPF Continue prednisone at 10 mg, supplemental oxygen Reviewed course of disease with last CT scan  showing marked progression of pulmonary fibrosis.  I feel that he is reaching end-stage and initiated discussion on goals of care He is DNR.  Wife is okay with palliative care but not ready for hospice as of yet.  As per the wife he is quite frail and gets exhausted with minimal activity.  He has lost some weight and she is trying to improve him with Ensure shakes.  Discussed use of low-dose morphine for symptom control and appetite stimulants but she prefers to hold off for now  Pulmonary embolism Echo reviewed with moderate pulmonary hypertension. I am not sure if his episodes of weakness and falls are related to his PE as echo is not markedly abnormal.  More likely it is from hypoxia and lung disease Agree with using low-dose Xarelto 10 mg for now.  If his falls persistent we will stop altogether  Follow Up Instructions: - Continue supplemental oxygen, prednisone at current dose - Continue Xarelto 10 mg - Palliative care referral for symptom management   I discussed the assessment and treatment plan with the patient. The patient was provided an opportunity to ask questions and all were answered. The patient agreed with the plan and demonstrated an understanding of the instructions.   The patient was advised to call back or seek an in-person evaluation if the symptoms worsen or if the condition fails to improve as anticipated.  I provided 25 minutes of non-face-to-face time during this encounter.  Marshell Garfinkel MD [facility] 01/04/2019, 12:50 PM

## 2019-01-04 NOTE — Patient Instructions (Signed)
I have reviewed your CT scan which shows moderate pulmonary hypertension which is consistent with pulmonary embolism and the pulmonary fibrosis As we discussed today the CT scan earlier this year shows progression of disease Continue supplemental oxygen, prednisone at 10 mg  We will place an order to palliative care for symptom management Follow-up in 3 months.

## 2019-01-05 ENCOUNTER — Ambulatory Visit: Payer: Medicare Other | Admitting: Cardiovascular Disease

## 2019-01-05 DIAGNOSIS — M549 Dorsalgia, unspecified: Secondary | ICD-10-CM | POA: Diagnosis not present

## 2019-01-09 DIAGNOSIS — M533 Sacrococcygeal disorders, not elsewhere classified: Secondary | ICD-10-CM | POA: Diagnosis not present

## 2019-01-12 ENCOUNTER — Inpatient Hospital Stay (HOSPITAL_COMMUNITY)
Admission: EM | Admit: 2019-01-12 | Discharge: 2019-01-18 | DRG: 314 | Disposition: A | Discharge status: Home Health | Payer: Medicare Other | Attending: Internal Medicine | Admitting: Internal Medicine

## 2019-01-12 ENCOUNTER — Emergency Department (HOSPITAL_COMMUNITY): Payer: Medicare Other

## 2019-01-12 ENCOUNTER — Other Ambulatory Visit: Payer: Self-pay

## 2019-01-12 DIAGNOSIS — Z20828 Contact with and (suspected) exposure to other viral communicable diseases: Secondary | ICD-10-CM | POA: Diagnosis not present

## 2019-01-12 DIAGNOSIS — Z87891 Personal history of nicotine dependence: Secondary | ICD-10-CM

## 2019-01-12 DIAGNOSIS — R413 Other amnesia: Secondary | ICD-10-CM | POA: Diagnosis present

## 2019-01-12 DIAGNOSIS — R5381 Other malaise: Secondary | ICD-10-CM | POA: Diagnosis not present

## 2019-01-12 DIAGNOSIS — Z9981 Dependence on supplemental oxygen: Secondary | ICD-10-CM | POA: Diagnosis not present

## 2019-01-12 DIAGNOSIS — Z79899 Other long term (current) drug therapy: Secondary | ICD-10-CM

## 2019-01-12 DIAGNOSIS — Z823 Family history of stroke: Secondary | ICD-10-CM

## 2019-01-12 DIAGNOSIS — J189 Pneumonia, unspecified organism: Secondary | ICD-10-CM | POA: Diagnosis not present

## 2019-01-12 DIAGNOSIS — Z8673 Personal history of transient ischemic attack (TIA), and cerebral infarction without residual deficits: Secondary | ICD-10-CM

## 2019-01-12 DIAGNOSIS — R296 Repeated falls: Secondary | ICD-10-CM | POA: Diagnosis present

## 2019-01-12 DIAGNOSIS — M5489 Other dorsalgia: Secondary | ICD-10-CM | POA: Diagnosis not present

## 2019-01-12 DIAGNOSIS — R109 Unspecified abdominal pain: Secondary | ICD-10-CM | POA: Diagnosis not present

## 2019-01-12 DIAGNOSIS — Z515 Encounter for palliative care: Secondary | ICD-10-CM

## 2019-01-12 DIAGNOSIS — L8990 Pressure ulcer of unspecified site, unspecified stage: Secondary | ICD-10-CM | POA: Insufficient documentation

## 2019-01-12 DIAGNOSIS — I10 Essential (primary) hypertension: Secondary | ICD-10-CM | POA: Diagnosis present

## 2019-01-12 DIAGNOSIS — I9789 Other postprocedural complications and disorders of the circulatory system, not elsewhere classified: Secondary | ICD-10-CM | POA: Diagnosis not present

## 2019-01-12 DIAGNOSIS — I714 Abdominal aortic aneurysm, without rupture, unspecified: Secondary | ICD-10-CM | POA: Diagnosis present

## 2019-01-12 DIAGNOSIS — Z7189 Other specified counseling: Secondary | ICD-10-CM

## 2019-01-12 DIAGNOSIS — Z781 Physical restraint status: Secondary | ICD-10-CM | POA: Diagnosis not present

## 2019-01-12 DIAGNOSIS — I63031 Cerebral infarction due to thrombosis of right carotid artery: Secondary | ICD-10-CM

## 2019-01-12 DIAGNOSIS — I2510 Atherosclerotic heart disease of native coronary artery without angina pectoris: Secondary | ICD-10-CM | POA: Diagnosis not present

## 2019-01-12 DIAGNOSIS — K219 Gastro-esophageal reflux disease without esophagitis: Secondary | ICD-10-CM | POA: Diagnosis present

## 2019-01-12 DIAGNOSIS — Z66 Do not resuscitate: Secondary | ICD-10-CM | POA: Diagnosis not present

## 2019-01-12 DIAGNOSIS — F0390 Unspecified dementia without behavioral disturbance: Secondary | ICD-10-CM | POA: Diagnosis present

## 2019-01-12 DIAGNOSIS — N390 Urinary tract infection, site not specified: Secondary | ICD-10-CM | POA: Diagnosis not present

## 2019-01-12 DIAGNOSIS — J9621 Acute and chronic respiratory failure with hypoxia: Secondary | ICD-10-CM | POA: Diagnosis not present

## 2019-01-12 DIAGNOSIS — E785 Hyperlipidemia, unspecified: Secondary | ICD-10-CM | POA: Diagnosis not present

## 2019-01-12 DIAGNOSIS — R918 Other nonspecific abnormal finding of lung field: Secondary | ICD-10-CM | POA: Diagnosis not present

## 2019-01-12 DIAGNOSIS — Z8546 Personal history of malignant neoplasm of prostate: Secondary | ICD-10-CM

## 2019-01-12 DIAGNOSIS — G9341 Metabolic encephalopathy: Secondary | ICD-10-CM | POA: Diagnosis not present

## 2019-01-12 DIAGNOSIS — R58 Hemorrhage, not elsewhere classified: Secondary | ICD-10-CM | POA: Diagnosis not present

## 2019-01-12 DIAGNOSIS — K625 Hemorrhage of anus and rectum: Secondary | ICD-10-CM | POA: Diagnosis present

## 2019-01-12 DIAGNOSIS — Y828 Other medical devices associated with adverse incidents: Secondary | ICD-10-CM | POA: Diagnosis present

## 2019-01-12 DIAGNOSIS — M4850XA Collapsed vertebra, not elsewhere classified, site unspecified, initial encounter for fracture: Secondary | ICD-10-CM

## 2019-01-12 DIAGNOSIS — Z8679 Personal history of other diseases of the circulatory system: Secondary | ICD-10-CM

## 2019-01-12 DIAGNOSIS — A419 Sepsis, unspecified organism: Secondary | ICD-10-CM | POA: Diagnosis not present

## 2019-01-12 DIAGNOSIS — M4856XA Collapsed vertebra, not elsewhere classified, lumbar region, initial encounter for fracture: Secondary | ICD-10-CM | POA: Diagnosis not present

## 2019-01-12 DIAGNOSIS — Z888 Allergy status to other drugs, medicaments and biological substances status: Secondary | ICD-10-CM

## 2019-01-12 DIAGNOSIS — Z7952 Long term (current) use of systemic steroids: Secondary | ICD-10-CM | POA: Diagnosis not present

## 2019-01-12 DIAGNOSIS — I700 Atherosclerosis of aorta: Secondary | ICD-10-CM | POA: Diagnosis present

## 2019-01-12 DIAGNOSIS — Z8701 Personal history of pneumonia (recurrent): Secondary | ICD-10-CM

## 2019-01-12 DIAGNOSIS — S32030A Wedge compression fracture of third lumbar vertebra, initial encounter for closed fracture: Secondary | ICD-10-CM

## 2019-01-12 DIAGNOSIS — Z86711 Personal history of pulmonary embolism: Secondary | ICD-10-CM

## 2019-01-12 DIAGNOSIS — Z8249 Family history of ischemic heart disease and other diseases of the circulatory system: Secondary | ICD-10-CM

## 2019-01-12 DIAGNOSIS — R0602 Shortness of breath: Secondary | ICD-10-CM | POA: Diagnosis not present

## 2019-01-12 DIAGNOSIS — J84112 Idiopathic pulmonary fibrosis: Secondary | ICD-10-CM | POA: Diagnosis present

## 2019-01-12 LAB — URINALYSIS, ROUTINE W REFLEX MICROSCOPIC
Bilirubin Urine: NEGATIVE
Glucose, UA: NEGATIVE mg/dL
Hgb urine dipstick: NEGATIVE
Ketones, ur: NEGATIVE mg/dL
Nitrite: POSITIVE — AB
Protein, ur: 30 mg/dL — AB
Specific Gravity, Urine: 1.036 — ABNORMAL HIGH (ref 1.005–1.030)
pH: 5 (ref 5.0–8.0)

## 2019-01-12 LAB — COMPREHENSIVE METABOLIC PANEL
ALT: 93 U/L — ABNORMAL HIGH (ref 0–44)
AST: 71 U/L — ABNORMAL HIGH (ref 15–41)
Albumin: 1.9 g/dL — ABNORMAL LOW (ref 3.5–5.0)
Alkaline Phosphatase: 174 U/L — ABNORMAL HIGH (ref 38–126)
Anion gap: 8 (ref 5–15)
BUN: 18 mg/dL (ref 8–23)
CO2: 25 mmol/L (ref 22–32)
Calcium: 8.6 mg/dL — ABNORMAL LOW (ref 8.9–10.3)
Chloride: 104 mmol/L (ref 98–111)
Creatinine, Ser: 0.9 mg/dL (ref 0.61–1.24)
GFR calc Af Amer: >60 mL/min (ref >60)
GFR calc non Af Amer: >60 mL/min (ref >60)
Glucose, Bld: 123 mg/dL — ABNORMAL HIGH (ref 70–99)
Potassium: 4.6 mmol/L (ref 3.5–5.1)
Sodium: 137 mmol/L (ref 135–145)
Total Bilirubin: 0.9 mg/dL (ref 0.3–1.2)
Total Protein: 6.8 g/dL (ref 6.5–8.1)

## 2019-01-12 LAB — CBC WITH DIFFERENTIAL/PLATELET
Abs Immature Granulocytes: 0.11 10*3/uL — ABNORMAL HIGH (ref 0.00–0.07)
Basophils Absolute: 0 10*3/uL (ref 0.0–0.1)
Basophils Relative: 0 %
Eosinophils Absolute: 0 10*3/uL (ref 0.0–0.5)
Eosinophils Relative: 0 %
HCT: 30.6 % — ABNORMAL LOW (ref 39.0–52.0)
Hemoglobin: 9.6 g/dL — ABNORMAL LOW (ref 13.0–17.0)
Immature Granulocytes: 1 %
Lymphocytes Relative: 6 %
Lymphs Abs: 1 10*3/uL (ref 0.7–4.0)
MCH: 29.2 pg (ref 26.0–34.0)
MCHC: 31.4 g/dL (ref 30.0–36.0)
MCV: 93 fL (ref 80.0–100.0)
Monocytes Absolute: 0.4 10*3/uL (ref 0.1–1.0)
Monocytes Relative: 3 %
Neutro Abs: 14.3 10*3/uL — ABNORMAL HIGH (ref 1.7–7.7)
Neutrophils Relative %: 90 %
Platelets: 243 10*3/uL (ref 150–400)
RBC: 3.29 MIL/uL — ABNORMAL LOW (ref 4.22–5.81)
RDW: 16.2 % — ABNORMAL HIGH (ref 11.5–15.5)
WBC: 15.8 10*3/uL — ABNORMAL HIGH (ref 4.0–10.5)
nRBC: 0 % (ref 0.0–0.2)

## 2019-01-12 LAB — POC OCCULT BLOOD, ED: Fecal Occult Bld: NEGATIVE

## 2019-01-12 MED ORDER — LEVOFLOXACIN IN D5W 750 MG/150ML IV SOLN
750.0000 mg | Freq: Once | INTRAVENOUS | Status: AC
  Administered 2019-01-12: 750 mg via INTRAVENOUS
  Filled 2019-01-12: qty 150

## 2019-01-12 MED ORDER — IOHEXOL 300 MG/ML  SOLN
100.0000 mL | Freq: Once | INTRAMUSCULAR | Status: AC | PRN
  Administered 2019-01-12: 100 mL via INTRAVENOUS

## 2019-01-12 MED ORDER — SODIUM CHLORIDE 0.9 % IV BOLUS
500.0000 mL | Freq: Once | INTRAVENOUS | Status: AC
  Administered 2019-01-12: 21:00:00 500 mL via INTRAVENOUS

## 2019-01-12 MED ORDER — MORPHINE SULFATE (PF) 4 MG/ML IV SOLN
4.0000 mg | Freq: Once | INTRAVENOUS | Status: AC
  Administered 2019-01-12: 4 mg via INTRAVENOUS
  Filled 2019-01-12: qty 1

## 2019-01-12 NOTE — H&P (Addendum)
History and Physical    [name] [name] [id number] DOB: Jul 24, 1929 DOA: 01/12/2019  PCP: [name] Pillar, MD Patient coming from: Home  Chief Complaint: Lower back pain  HPI: [name] [name] is a 85 y.o. male with medical history significant of memory loss, idiopathic pulmonary fibrosis and chronic respiratory failure on 5 L home oxygen, history of PE on Xarelto, prostate cancer, spondylolisthesis, CVA/TIA, AAA, carotid artery stenosis, GERD, hyperlipidemia presenting with complaints of lower back pain.  Patient reported having lower back pain for several months.  He has no other complaints.  Per triage note, wife reported rectal bleeding.  No family available at this time.  Patient denies rectal bleeding or noticing any blood in his stool.  Denies abdominal pain.  Denies fevers, chills, cough, shortness of breath, or chest pain.  Denies dysuria, urinary frequency, or urgency.   ED Course: Not tachycardic.  Blood pressure stable.  Slightly tachypneic.  Afebrile.  On 5 L supplemental oxygen, same as home requirement.  White blood cell count 15.8.  Hemoglobin 9.6. FOBT pending.  UA pending.  CT abdomen pelvis showing increased size of the infrarenal abdominal aortic aneurysm sac measuring 7.2 x 5.8 cm with continued probable type II endoleak.  Patent aortobiiliac stent graft.  Worsening chronic interstitial lung changes at both lung bases with new airspace patchy consolidation in the anterior left lower lobe/lingula concerning for infection.  Age indeterminate, new since 2018, slight superior compression deformities of the L2 and L3 vertebral bodies.  Chronic pars defects at L5-S1 with grade 2 anterolisthesis of L5 on S1. Patient received morphine, Levaquin, and a 500 cc fluid bolus.  Review of Systems:  All systems reviewed and apart from history of presenting illness, are negative.  Past Medical History:  Diagnosis Date  . AAA (abdominal aortic aneurysm) ([place])   . Allergy   .  Carotid artery stenosis   . Cognitive decline   . Diplopia   . GERD (gastroesophageal reflux disease)   . Hyperlipidemia   . Memory loss   . Prostate cancer ([place])   . Pulmonary fibrosis ([place])   . Spondylisthesis 2015   Spine  . Stroke ([name] [name]) 04/18/13   some weakness left-returning strength  . TIA (transient ischemic attack)     Past Surgical History:  Procedure Laterality Date  . ABDOMINAL AORTIC ENDOVASCULAR STENT GRAFT N/A 06/17/2016   Procedure: ABDOMINAL AORTIC ENDOVASCULAR STENT GRAFT;  Surgeon: Waynetta Sandy, MD;  Location: [facility] OR;  Service: Vascular;  Laterality: N/A;  . CAROTID ENDARTERECTOMY  Sept. 2008   Elective left carotid endarterectomy  . CAROTID ENDARTERECTOMY Right 04/27/2013   CE  . ENDARTERECTOMY Right 04/27/2013   Procedure: ENDARTERECTOMY CAROTID;  Surgeon: Mal Misty, MD;  Location: [facility];  Service: Vascular;  Laterality: Right;  . EYE SURGERY     Bilateral cataract   . FOOT SURGERY      left Achilles Tendon Repair  . seed implantation  2001   for prostate ca     reports that he quit smoking about 53 years ago. His smoking use included cigarettes. He has a 14.00 pack-year smoking history. He has never used smokeless tobacco. He reports current alcohol use of about 1.0 standard drinks of alcohol per week. He reports that he does not use drugs.  Allergies  Allergen Reactions  . Sudafed [Pseudoephedrine Hcl]     Causes dizziness and nervousness  . Atorvastatin Other (See Comments)    Reaction: Patient's family reports "confusion" and would prefer crestor  Family History  Problem Relation Age of Onset  . Stroke Mother   . Heart disease Mother        After age 74  . Varicose Veins Mother   . Hypertension Mother   . Cancer Mother        [name] [name]   . Stroke Father   . Heart attack Father   . Deep vein thrombosis Father   . Anuerysm Father   . Heart disease Brother     Prior to Admission medications   Medication Sig Start Date End  Date Taking? Authorizing Provider  acetaminophen (TYLENOL) 500 MG tablet Take 1,000 mg by mouth every 6 (six) hours as needed for mild pain or fever.     [provider]  azelastine (ASTELIN) 0.1 % nasal spray Place 2 sprays into both nostrils 2 (two) times daily.  01/08/15   [provider]  cephALEXin (KEFLEX) 500 MG capsule Take 1 capsule (500 mg total) by mouth every 12 (twelve) hours. 10/21/18   Geradine Girt, DO  dextromethorphan-guaiFENesin (MUCINEX DM) 30-600 MG 12hr tablet Take 1 tablet by mouth 2 (two) times daily.     [provider]  L-Methylfolate-B12-B6-B2 (CEREFOLIN) 08-20-48-5 MG TABS TAKE 1 TABLET BY MOUTH EVERY DAY 03/30/18   Dennie Bible, NP  loratadine (CLARITIN) 10 MG tablet Take 10 mg by mouth daily.    [provider]  Multiple Vitamins-Minerals (CENTRUM SILVER PO) Take 1 tablet by mouth daily.     [provider]  nitrofurantoin, macrocrystal-monohydrate, (MACROBID) 100 MG capsule Take 100 mg by mouth 2 (two) times daily. 01/11/19   [provider]  Omega-3 Fatty Acids (FISH OIL PO) Take 1 capsule by mouth daily.    [provider]  pantoprazole (PROTONIX) 40 MG tablet TAKE 1 TABLET DAILY,30 MINUTES BEFORE DINNER. 12/15/18   Juanito Doom, MD  predniSONE (DELTASONE) 10 MG tablet TAKE 1 TABLET EVERY DAY WITH BREAKFAST. 10/23/18   Juanito Doom, MD  Resveratrol 100 MG CAPS Take 100 mg by mouth every evening.     [provider]  rivaroxaban (XARELTO) 10 MG TABS tablet Take 1 tablet (10 mg total) by mouth daily. 01/04/19   Mannam, Hart Robinsons, MD  rosuvastatin (CRESTOR) 5 MG tablet Take 5 mg by mouth at bedtime.    [provider]    Physical Exam: Vitals:   01/12/19 1900 01/12/19 1915 01/12/19 2330 01/13/19 0246  BP: (!) 163/91 (!) 141/74 (!) 151/88 (!) 174/98  Pulse:    94  Resp: (!) 24 (!) 22 (!) 27 20  Temp:    97.6 F (36.4 C)  TempSrc:    Oral  SpO2:    100%  Weight:       Height:        Physical Exam  Constitutional: He is oriented to person, place, and time. No distress.  HENT:  Head: Normocephalic.  Eyes: Right eye exhibits no discharge. Left eye exhibits no discharge.  Neck: Neck supple.  Cardiovascular: Normal rate, regular rhythm and intact distal pulses.  Pulmonary/Chest: Effort normal. He has no wheezes.  Coarse breath sounds auscultated bilaterally 5 L supplemental oxygen (same as home requirement) Slightly tachypneic Coughing  Abdominal: Soft. Bowel sounds are normal. He exhibits no distension. There is no abdominal tenderness. There is no guarding.  Musculoskeletal:        General: No edema.  Neurological: He is alert and oriented to person, place, and time.  Skin: Skin is warm and dry. He  is not diaphoretic.     Labs on Admission: I have personally reviewed following labs and imaging studies  CBC: Recent Labs  Lab 01/12/19 1801 01/13/19 0237  WBC 15.8* 14.1*  NEUTROABS 14.3*  --   HGB 9.6* 9.9*  HCT 30.6* 31.8*  MCV 93.0 93.8  PLT 243 0000000   Basic Metabolic Panel: Recent Labs  Lab 01/12/19 1801  NA 137  K 4.6  CL 104  CO2 25  GLUCOSE 123*  BUN 18  CREATININE 0.90  CALCIUM 8.6*   GFR: Estimated Creatinine Clearance: 55.6 mL/min (by C-G formula based on SCr of 0.9 mg/dL). Liver Function Tests: Recent Labs  Lab 01/12/19 1801  AST 71*  ALT 93*  ALKPHOS 174*  BILITOT 0.9  PROT 6.8  ALBUMIN 1.9*   No results for input(s): LIPASE, AMYLASE in the last 168 hours. No results for input(s): AMMONIA in the last 168 hours. Coagulation Profile: No results for input(s): INR, PROTIME in the last 168 hours. Cardiac Enzymes: No results for input(s): CKTOTAL, CKMB, CKMBINDEX, TROPONINI in the last 168 hours. BNP (last 3 results) No results for input(s): PROBNP in the last 8760 hours. HbA1C: No results for input(s): HGBA1C in the last 72 hours. CBG: No results for input(s): GLUCAP in the last 168 hours. Lipid Profile:  No results for input(s): CHOL, HDL, LDLCALC, TRIG, CHOLHDL, LDLDIRECT in the last 72 hours. Thyroid Function Tests: No results for input(s): TSH, T4TOTAL, FREET4, T3FREE, THYROIDAB in the last 72 hours. Anemia Panel: No results for input(s): VITAMINB12, FOLATE, FERRITIN, TIBC, IRON, RETICCTPCT in the last 72 hours. Urine analysis:    Component Value Date/Time   COLORURINE AMBER (A) 01/12/2019 2201   APPEARANCEUR HAZY (A) 01/12/2019 2201   LABSPEC 1.036 (H) 01/12/2019 2201   PHURINE 5.0 01/12/2019 2201   GLUCOSEU NEGATIVE 01/12/2019 2201   HGBUR NEGATIVE 01/12/2019 2201   BILIRUBINUR NEGATIVE 01/12/2019 2201   KETONESUR NEGATIVE 01/12/2019 2201   PROTEINUR 30 (A) 01/12/2019 2201   UROBILINOGEN 0.2 04/25/2013 1449   NITRITE POSITIVE (A) 01/12/2019 2201   LEUKOCYTESUR LARGE (A) 01/12/2019 2201    Radiological Exams on Admission: Ct Chest Wo Contrast  Result Date: 01/13/2019 CLINICAL DATA:  85 year old male with shortness of breath. EXAM: CT CHEST WITHOUT CONTRAST TECHNIQUE: Multidetector CT imaging of the chest was performed following the standard protocol without IV contrast. COMPARISON:  Chest CT dated 08/17/2018 and radiograph dated 10/20/2018. FINDINGS: Evaluation of this exam is limited in the absence of intravenous contrast. Cardiovascular: There is no cardiomegaly or pericardial effusion. Multi vessel coronary vascular calcifications noted. There is moderate atherosclerotic calcification of the thoracic aorta. The aorta is mildly tortuous. Mild dilatation of the main pulmonary trunk suggestive of a degree of pulmonary hypertension. Clinical correlation is recommended. Mediastinum/Nodes: No hilar or mediastinal adenopathy. The esophagus is grossly unremarkable. No mediastinal fluid collection. Lungs/Pleura: There is background of interstitial lung disease and fibrosis. Progression of consolidative change primarily involving the lingula since the prior CT of 08/17/2018. Although this may  be related to progression of fibrosis, superimposed pneumonia is not excluded. Clinical correlation is recommended. There is no pleural effusion or pneumothorax. The central airways remain patent. Upper Abdomen: No acute abnormality. Musculoskeletal: No chest wall mass or suspicious bone lesions identified. IMPRESSION: Advanced interstitial lung disease and fibrosis. Increased consolidative change in the lingula since the prior CT of 08/17/2018 may be related to progression of fibrosis, although superimposed pneumonia is not excluded. Clinical correlation is recommended. Aortic Atherosclerosis (ICD10-I70.0). Electronically Signed  By: Anner Crete M.D.   On: 01/13/2019 01:35   Ct Abdomen Pelvis W Contrast  Result Date: 01/12/2019 CLINICAL DATA:  Acute abdominal pain severe lower back pain and rectal bleeding, history of pulmonary fibrosis EXAM: CT ABDOMEN AND PELVIS WITH CONTRAST TECHNIQUE: Multidetector CT imaging of the abdomen and pelvis was performed using the standard protocol following bolus administration of intravenous contrast. CONTRAST:  156mL OMNIPAQUE IOHEXOL 300 MG/ML  SOLN COMPARISON:  February 03, 2017 FINDINGS: Lower chest: There is mild cardiomegaly. There is interval significant worsening in the interstitial thickening and reticulonodular patchy airspace opacities within both lungs. There is new airspace consolidation seen within the anterior left lower lobe/lingula. No pleural effusion is seen. Coronary artery calcifications are seen. No hiatal hernia. Hepatobiliary: The liver is normal in density without focal abnormality.The main portal vein is patent. No evidence of calcified gallstones, gallbladder wall thickening or biliary dilatation. Pancreas: Unremarkable. No pancreatic ductal dilatation or surrounding inflammatory changes. Spleen: Normal in size without focal abnormality. Adrenals/Urinary Tract: Both adrenal glands appear normal. The kidneys and collecting system appear normal  without evidence of urinary tract calculus or hydronephrosis. Bladder is unremarkable. Stomach/Bowel: The stomach, small bowel, and colon are normal in appearance. No inflammatory changes, wall thickening, or obstructive findings.There is a moderate amount of colonic stool. No focal area of bowel wall thickening or pneumatosis. Vascular/Lymphatic: There has been interval increased size in the infrarenal abdominal aortic aneurysm sac which now measures 7.2 x 5.8 cm (see 5.1 x 5.9 cm previously) E. There appears to be hyperdense material seen within the aneurysm sac more notable on the delayed image, consistent with endoleak. The contrast filling of the aneurysm sac more so on the delayed image in the posterior right aspect of the sac, likely due to a type 2 endoleak. There does appear to be patency however of the endograft. Reproductive: Radiation prostate seeds are seen. Other: No evidence of abdominal wall mass or hernia. Musculoskeletal: There is a new since the prior exam superior compression deformity of the L2 and L3 vertebral bodies with less than 25% loss in height. Chronic bilateral pars defects are seen at L5-S1 with a grade 2 anterolisthesis of L5-S1. IMPRESSION: 1. Increased size of the infrarenal abdominal aortic aneurysm sac measuring 7.2 x 5.8 cm with continued probable type 2 endoleak as there is increased contrast filling of the aneurysm sac posteriorly on delayed images. 2. Patent aorto bi-iliac stent graft. 3. Worsening chronic interstitial lung changes at both lung bases with new airspace patchy consolidation in the anterior left lower lobe/lingula which could be superimposed infection. 4. Age indeterminate, new since 2018, slight superior compression deformities of the L2 and L3 vertebral bodies with less than 25% loss of height. 5. Chronic pars defects at L5-S1 with grade 2 anterolisthesis of L5 on S1. 6. These results were called by telephone at the time of interpretation on 01/12/2019 at 8:16  pm to provider [facility] , who verbally acknowledged these results. Electronically Signed   By: Prudencio Pair M.D.   On: 01/12/2019 20:16    EKG: Independently reviewed.  Sinus rhythm.  Assessment/Plan Principal Problem:   AAA (abdominal aortic aneurysm) ([facility]) Active Problems:   IPF (idiopathic pulmonary fibrosis) ([facility])   UTI (urinary tract infection)   Vertebral compression fracture ([facility])   Community acquired pneumonia   Worsening abdominal aortic aneurysm CT abdomen pelvis showing increased size of the infrarenal abdominal aortic aneurysm sac measuring 7.2 x 5.8 cm with continued probable type II endoleak.  Patent aortobiiliac stent graft. -Currently hemodynamically stable.  Consult vascular surgery in the morning.  Vertebral compression fracture CT showing age indeterminate, new since 2018, slight superior compression deformities of the L2 and L3 vertebral bodies. -IV morphine as needed for pain  Community-acquired pneumonia Afebrile.  White blood cell count 15.8.  Lactate normal.  On 5 L supplemental oxygen, same as home requirement.  CT chest showing advanced interstitial lung disease and fibrosis.  Increased consolidative change in the lingula since the prior CT from 08/17/2018, may be related to progression of fibrosis although superimposed pneumonia is not excluded. -SARS-CoV-2 test pending -Received Levaquin in the ED.  Continue. -Continuous pulse ox -Supplemental oxygen -Continue to monitor white blood cell count  Addendum: Paged by nursing staff this morning due to concern for tachycardia.  Patient suddenly became tachycardic this morning with heart rate in the 120s to 130s.  He has been seen and evaluated at bedside.  Appears comfortable and has no complaints.  Denies chest pain, heart palpitations, shortness of breath, or abdominal pain.  Oxygen saturation 96% on 5 L supplemental oxygen, same as home requirement.  Blood pressure stable.  EKG showing sinus  tachycardia.  Patient has had received a total of approximately 700 cc fluid. -Bed request changed from telemetry to progressive care unit -Stop IV fluid -Check BNP -Stat chest x-ray to assess for volume overload given diastolic dysfunction on prior echo -Stat abdominal x-ray to re-assess AAA -Heart rate improved with 5 mg IV Lopressor.  Blood pressure continues to be stable.  UTI UA with positive nitrite, large amount of leukocytes, 6-10 WBCs, and rare bacteria. -Levaquin -Urine culture  ?Rectal bleeding Per triage note, wife reported rectal bleeding.  No family available at this time.  Takes Xarelto at home.  Patient denies noticing any bleeding.  No significant change in hemoglobin from baseline.  FOBT negative.  Intermittent rectal bleeding could possibly be due to hemorrhoids. -Continue to monitor  Physical deconditioning -PT evaluation  History of PE -Hold Xarelto at this time.  Pending vascular surgery evaluation for expanding AAA.  Idiopathic pulmonary fibrosis -Continue home supplemental oxygen  DVT prophylaxis: SCDs at this time Code Status: Full code Family Communication: No family available at this time. Disposition Plan: Anticipate discharge after clinical improvement. Admission status: It is my clinical opinion that admission to [facility] is reasonable and necessary in this 85 y.o. male . presenting with worsening AAA, community-acquired pneumonia, UTI.  High risk of decompensation.  Given the aforementioned, the predictability of an adverse outcome is felt to be significant. I expect that the patient will require at least 2 midnights in the hospital to treat this condition.   The medical decision making on this patient was of high complexity and the patient is at high risk for clinical deterioration, therefore this is a level 3 visit.  Shela Leff MD [facility] Pager [phone]  If 7PM-7AM, please contact night-coverage [email or web address]  Password [facility]  01/13/2019, 3:38 AM

## 2019-01-12 NOTE — ED Triage Notes (Addendum)
BIB EMS from home. Wife stated pt has been complaining of severe lower back pain and rectal bleeding. Pt denies injury. History of pulmonary fibrosis, uses 5L nasal cannula at home. Also has a history of stroke.

## 2019-01-13 ENCOUNTER — Inpatient Hospital Stay (HOSPITAL_COMMUNITY): Payer: Medicare Other

## 2019-01-13 DIAGNOSIS — J84112 Idiopathic pulmonary fibrosis: Secondary | ICD-10-CM | POA: Diagnosis not present

## 2019-01-13 DIAGNOSIS — I714 Abdominal aortic aneurysm, without rupture: Secondary | ICD-10-CM

## 2019-01-13 DIAGNOSIS — J189 Pneumonia, unspecified organism: Secondary | ICD-10-CM

## 2019-01-13 DIAGNOSIS — L8990 Pressure ulcer of unspecified site, unspecified stage: Secondary | ICD-10-CM | POA: Insufficient documentation

## 2019-01-13 DIAGNOSIS — M4850XA Collapsed vertebra, not elsewhere classified, site unspecified, initial encounter for fracture: Secondary | ICD-10-CM

## 2019-01-13 DIAGNOSIS — N390 Urinary tract infection, site not specified: Secondary | ICD-10-CM

## 2019-01-13 LAB — CBC
HCT: 31.8 % — ABNORMAL LOW (ref 39.0–52.0)
Hemoglobin: 9.9 g/dL — ABNORMAL LOW (ref 13.0–17.0)
MCH: 29.2 pg (ref 26.0–34.0)
MCHC: 31.1 g/dL (ref 30.0–36.0)
MCV: 93.8 fL (ref 80.0–100.0)
Platelets: 249 10*3/uL (ref 150–400)
RBC: 3.39 MIL/uL — ABNORMAL LOW (ref 4.22–5.81)
RDW: 16.1 % — ABNORMAL HIGH (ref 11.5–15.5)
WBC: 14.1 10*3/uL — ABNORMAL HIGH (ref 4.0–10.5)
nRBC: 0 % (ref 0.0–0.2)

## 2019-01-13 LAB — GLUCOSE, CAPILLARY
Glucose-Capillary: 66 mg/dL — ABNORMAL LOW (ref 70–99)
Glucose-Capillary: 155 mg/dL — ABNORMAL HIGH (ref 70–99)
Glucose-Capillary: 137 mg/dL — ABNORMAL HIGH (ref 70–99)
Glucose-Capillary: 123 mg/dL — ABNORMAL HIGH (ref 70–99)

## 2019-01-13 LAB — LACTIC ACID, PLASMA: Lactic Acid, Venous: 1 mmol/L (ref 0.5–1.9)

## 2019-01-13 LAB — SARS CORONAVIRUS 2 BY RT PCR (HOSPITAL ORDER, PERFORMED IN ~~LOC~~ HOSPITAL LAB): SARS Coronavirus 2: NEGATIVE

## 2019-01-13 LAB — PROCALCITONIN: Procalcitonin: 0.98 ng/mL

## 2019-01-13 LAB — BRAIN NATRIURETIC PEPTIDE: B Natriuretic Peptide: 214.5 pg/mL — ABNORMAL HIGH (ref 0.0–100.0)

## 2019-01-13 LAB — MRSA PCR SCREENING: MRSA by PCR: NEGATIVE

## 2019-01-13 MED ORDER — METOPROLOL TARTRATE 5 MG/5ML IV SOLN
2.5000 mg | Freq: Four times a day (QID) | INTRAVENOUS | Status: DC
  Administered 2019-01-13 – 2019-01-14 (×3): 2.5 mg via INTRAVENOUS
  Filled 2019-01-13 (×2): qty 5

## 2019-01-13 MED ORDER — GUAIFENESIN-DM 100-10 MG/5ML PO SYRP
10.0000 mL | ORAL_SOLUTION | Freq: Four times a day (QID) | ORAL | Status: DC | PRN
  Filled 2019-01-13: qty 10

## 2019-01-13 MED ORDER — METOPROLOL TARTRATE 5 MG/5ML IV SOLN
5.0000 mg | Freq: Once | INTRAVENOUS | Status: AC
  Administered 2019-01-13: 5 mg via INTRAVENOUS
  Filled 2019-01-13: qty 5

## 2019-01-13 MED ORDER — ACETAMINOPHEN 325 MG PO TABS: 650.0000 mg | ORAL_TABLET | Freq: Four times a day (QID) | ORAL | Status: DC | PRN

## 2019-01-13 MED ORDER — LEVOFLOXACIN IN D5W 750 MG/150ML IV SOLN: 750.0000 mg | INTRAVENOUS | Status: DC

## 2019-01-13 MED ORDER — ACETAMINOPHEN 650 MG RE SUPP
650.0000 mg | Freq: Four times a day (QID) | RECTAL | Status: DC | PRN
  Administered 2019-01-14: 650 mg via RECTAL
  Filled 2019-01-13: qty 1

## 2019-01-13 MED ORDER — MORPHINE SULFATE (PF) 2 MG/ML IV SOLN
1.0000 mg | INTRAVENOUS | Status: DC | PRN
  Administered 2019-01-13: 1 mg via INTRAVENOUS
  Filled 2019-01-13 (×2): qty 1

## 2019-01-13 MED ORDER — SODIUM CHLORIDE 0.9 % IV BOLUS
250.0000 mL | Freq: Once | INTRAVENOUS | Status: AC
  Administered 2019-01-13: 250 mL via INTRAVENOUS

## 2019-01-13 MED ORDER — CHLORHEXIDINE GLUCONATE 0.12 % MT SOLN
15.0000 mL | Freq: Two times a day (BID) | OROMUCOSAL | Status: DC
  Administered 2019-01-15 – 2019-01-18 (×7): 15 mL via OROMUCOSAL
  Filled 2019-01-13 (×6): qty 15

## 2019-01-13 MED ORDER — SODIUM CHLORIDE 0.9 % IV SOLN
500.0000 mg | INTRAVENOUS | Status: DC
  Administered 2019-01-14 – 2019-01-18 (×5): 500 mg via INTRAVENOUS
  Filled 2019-01-13 (×7): qty 500

## 2019-01-13 MED ORDER — SODIUM CHLORIDE 0.9 % IV SOLN
2.0000 g | INTRAVENOUS | Status: DC
  Administered 2019-01-13 – 2019-01-18 (×5): 2 g via INTRAVENOUS
  Filled 2019-01-13 (×2): qty 20
  Filled 2019-01-13 (×3): qty 2

## 2019-01-13 MED ORDER — SODIUM CHLORIDE 0.9 % IV SOLN
INTRAVENOUS | Status: DC
  Administered 2019-01-13: 02:00:00 via INTRAVENOUS

## 2019-01-13 MED ORDER — DEXTROSE 50 % IV SOLN
INTRAVENOUS | Status: AC
  Administered 2019-01-13: 50 mL
  Filled 2019-01-13: qty 50

## 2019-01-13 MED ORDER — FAMOTIDINE IN NACL 20-0.9 MG/50ML-% IV SOLN
20.0000 mg | Freq: Two times a day (BID) | INTRAVENOUS | Status: DC
  Administered 2019-01-13 – 2019-01-18 (×11): 20 mg via INTRAVENOUS
  Filled 2019-01-13 (×12): qty 50

## 2019-01-13 MED ORDER — CHLORHEXIDINE GLUCONATE CLOTH 2 % EX PADS
6.0000 | MEDICATED_PAD | Freq: Every day | CUTANEOUS | Status: DC
  Administered 2019-01-13 – 2019-01-17 (×4): 6 via TOPICAL

## 2019-01-13 MED ORDER — LACTATED RINGERS IV SOLN
INTRAVENOUS | Status: DC
  Administered 2019-01-13 – 2019-01-17 (×5): via INTRAVENOUS

## 2019-01-13 MED ORDER — METHYLPREDNISOLONE SODIUM SUCC 40 MG IJ SOLR
40.0000 mg | Freq: Two times a day (BID) | INTRAMUSCULAR | Status: DC
  Administered 2019-01-13 – 2019-01-18 (×10): 40 mg via INTRAVENOUS
  Filled 2019-01-13 (×10): qty 1

## 2019-01-13 MED ORDER — ORAL CARE MOUTH RINSE
15.0000 mL | Freq: Two times a day (BID) | OROMUCOSAL | Status: DC
  Administered 2019-01-13 – 2019-01-17 (×4): 15 mL via OROMUCOSAL

## 2019-01-13 NOTE — ED Notes (Signed)
Patient SpO2 77% on 5L [place]. Applied venturi mask at 55% oxygen. SpO2 improving, will continue to monitor.

## 2019-01-13 NOTE — Consult Note (Signed)
NAME:  [name] [name], MRN:  [id number], DOB:  [birth year], LOS: 1 ADMISSION DATE:  01/12/2019, CONSULTATION DATE:  10/24 REFERRING MD:  ED,  CHIEF COMPLAINT: PNA/UTI  Brief History   85 yo male with PMH significant for memory loss, chronic respiratory failure in setting of idiopathic pulmonary fibrosis on 5 L [place] at home, recent PE on Xarelto, AAA, GERD, being admitted with sepsis in setting of PNA/UTI and increasing aneurysm. Vascular evaluated, no emergent indication for OR.   History of present illness   Patient seen in clinic 10/1 for 4 month anticoagulation follow up in setting of PE.  Follow up virtual visit on 10/15. CT Chest at that time with progression of IPF.  Family had elected previously to not pursue anti-fibrotics.  Due to recurrent falls, his Xarelto dose was lowered to 10 mg daily.  COVID testing completed on 10/13 due to increased cough.    Patient presented to ED yesterday for persistent lower back pain and worsening cough.  Initial work up revealed increasing size of infrarenal abdominal aortic aneurysm and patch consolidation of the left lower lobe concerning for PNA.  UA suspicious for UTI as well.  Also found to have compression deformities of the L2, L3 vertebral bodies.  He received Levaquin in the ED for antimicrobial coverage.  He remained in the ED awaiting inpatient bed.  He was noted to have marginal BP, tachycardia and increasing O2 requirements. Therefore, PCCM consulted to admit to ICU. Wife at bedside, states he recently completed a course of Levaquin.  Patient is lethargic but arousable and will follow commands and attempt to participate in exam, however confused. No focal deficits on exam.   Past Medical History  IPF (prednisone), family decided to not pursue anti-fibrotics Chronic hypoxemia: 5 L [place] Subsegmental PE (on Xarelto) GERD Stroke AAA CAD Memory Loss/Coginitive Decline   Significant Hospital Events   20/24: Admit  Consults:  PCCM  Vascular  Procedures:  NA  Significant Diagnostic Tests:  10/24: CT Chest:  Micro Data:  Urine Cx 10/24 >>Advanced ILD/F.  Increased consolidation in the lingula.    Antimicrobials:  Rocephin 10/24 >> Azithro 10/24 >>  Interim history/subjective:  Resting in bed comfortably, will participate in exam. Denies any current pain.   Objective   Blood pressure (!) 102/59, pulse (!) 111, temperature 97.6 F (36.4 C), temperature source Oral, resp. rate (!) 24, height 5\' 10"  (1.778 m), weight 68 kg, SpO2 97 %.        Intake/Output Summary (Last 24 hours) at 01/13/2019 1228 Last data filed at 01/13/2019 0147 Gross per 24 hour  Intake 720.33 ml  Output -  Net 720.33 ml   Filed Weights   01/12/19 1713  Weight: 68 kg    Examination: General: lethargic but arouses HENT: AT/[place] Lungs: diminished lung fields, tachypnea, hacky cough  Cardiovascular: ST, warm and well perfused, no edema Abdomen: soft, non-tender, non-distended Extremities: No deformities Neuro: Lethargic, intermittently confused. Follows commands with no focal deficits.    Resolved Hospital Problem list     Assessment & Plan:  Acute on chronic respiratory failure IPF (dx 2017) Plan: Empiric abx, see below Supplemental O2, wean for goal SpO2 > 88%. Steroids, adjust to IV.  Add SSI while on steroids  Pneumonia CXR with left infiltrate Plan: Empiric Abx: Rocephin, Azithro.   Unable to obtain respiratory culture Wife reports recent diarrhea: Urine legionella antigen pending.   UTI Plan: UA: +, Cx pending Empiric Abx: Rocephin  Pulmonary embolism (dx 5/28)  Plan: On outpatient Xarelto, if ok with Vascular will resume  AAA Previous aorta biiliac endovascular stent graft with Dr. Donzetta Matters on 06/17/2016 for repair of his aneurysm.  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  Marked progression of pulmonary fibrosis on last CT. Palliative care consult.  Family Communication: Wife at bedside and updated Disposition: ICU with expected length of stay > 2 nights.    Labs   CBC: Recent Labs  Lab 01/12/19 1801 01/13/19 0237  WBC 15.8* 14.1*  NEUTROABS 14.3*  --   HGB 9.6* 9.9*  HCT 30.6* 31.8*  MCV 93.0 93.8  PLT 243 0000000    Basic Metabolic Panel: Recent Labs  Lab 01/12/19 1801  NA 137  K 4.6  CL 104  CO2 25  GLUCOSE 123*  BUN 18  CREATININE 0.90  CALCIUM 8.6*   GFR: Estimated Creatinine Clearance: 55.6 mL/min (by C-G formula based on SCr of 0.9 mg/dL). Recent Labs  Lab 01/12/19 1801 01/13/19 0220 01/13/19 0237  WBC 15.8*  --  14.1*  LATICACIDVEN  --  1.0  --     Liver Function Tests: Recent Labs  Lab 01/12/19 1801  AST 71*  ALT 93*  ALKPHOS 174*  BILITOT 0.9  PROT 6.8  ALBUMIN 1.9*   No results for input(s): LIPASE, AMYLASE in the last 168 hours. No results for input(s): AMMONIA in the last 168 hours.  ABG    Component Value Date/Time   PHART 7.440 06/11/2016 1610   PCO2ART 37.8 06/11/2016 1610   PO2ART 62.9 (L) 06/11/2016 1610   HCO3 25.3 06/11/2016 1610   O2SAT 91.8 06/11/2016 1610     Coagulation Profile: No results for input(s): INR, PROTIME in the last 168 hours.  Cardiac Enzymes: No results for input(s): CKTOTAL, CKMB, CKMBINDEX, TROPONINI in the last 168 hours.  HbA1C: Hgb A1c MFr Bld  Date/Time Value Ref Range Status  04/19/2013 05:20 AM 5.9 (H) <5.7 % Final    Comment:    (NOTE)                                                                       According to the ADA Clinical Practice Recommendations for 2011, when HbA1c is used as a screening test:  >=6.5%   Diagnostic of Diabetes Mellitus           (if abnormal result is  confirmed) 5.7-6.4%   Increased risk of developing Diabetes Mellitus References:Diagnosis and Classification of Diabetes Mellitus,Diabetes [id number] 1):S62-S69 and Standards of Medical Care in         Diabetes - 2011,Diabetes [id number] (Suppl 1):S11-S61.  04/18/2013 01:40 PM 5.8 (H) <5.7 % Final    Comment:    (NOTE)  According to the ADA Clinical Practice Recommendations for 2011, when HbA1c is used as a screening test:  >=6.5%   Diagnostic of Diabetes Mellitus           (if abnormal result is confirmed) 5.7-6.4%   Increased risk of developing Diabetes Mellitus References:Diagnosis and Classification of Diabetes Mellitus,Diabetes [id number] 1):S62-S69 and Standards of Medical Care in         Diabetes - 2011,Diabetes [id number] (Suppl 1):S11-S61.    CBG: No results for input(s): GLUCAP in the last 168 hours.  Review of Systems:   Unable to obtain due to clinical status  Past Medical History  He,  has a past medical history of AAA (abdominal aortic aneurysm) ([place]), Allergy, Carotid artery stenosis, Cognitive decline, Diplopia, GERD (gastroesophageal reflux disease), Hyperlipidemia, Memory loss, Prostate cancer ([place]), Pulmonary fibrosis ([employer]), Spondylisthesis (2015), Stroke ([place]) (04/18/13), and TIA (transient ischemic attack).   Surgical History    Past Surgical History:  Procedure Laterality Date  . ABDOMINAL AORTIC ENDOVASCULAR STENT GRAFT N/A 06/17/2016   Procedure: ABDOMINAL AORTIC ENDOVASCULAR STENT GRAFT;  Surgeon: Waynetta Sandy, MD;  Location: [facility] OR;  Service: Vascular;  Laterality: N/A;  . CAROTID ENDARTERECTOMY  Sept. 2008   Elective left carotid endarterectomy  . CAROTID ENDARTERECTOMY Right 04/27/2013   CE  . ENDARTERECTOMY Right 04/27/2013   Procedure: ENDARTERECTOMY CAROTID;  Surgeon: Mal Misty, MD;  Location: [place] [facility];  Service: Vascular;  Laterality: Right;  . EYE  SURGERY     Bilateral cataract   . FOOT SURGERY      left Achilles Tendon Repair  . seed implantation  2001   for prostate ca     Social History   reports that he quit smoking about 53 years ago. His smoking use included cigarettes. He has a 14.00 pack-year smoking history. He has never used smokeless tobacco. He reports current alcohol use of about 1.0 standard drinks of alcohol per week. He reports that he does not use drugs.   Family History   His family history includes Anuerysm in his father; Cancer in his mother; Deep vein thrombosis in his father; Heart attack in his father; Heart disease in his brother and mother; Hypertension in his mother; Stroke in his father and mother; Varicose Veins in his mother.   Allergies Allergies  Allergen Reactions  . Sudafed [Pseudoephedrine Hcl]     Causes dizziness and nervousness  . Atorvastatin Other (See Comments)    Reaction: Patient's family reports "confusion" and would prefer crestor      Home Medications  Prior to Admission medications   Medication Sig Start Date End Date Taking? Authorizing Provider  acetaminophen (TYLENOL) 500 MG tablet Take 1,000 mg by mouth every 6 (six) hours as needed for mild pain or fever.     [provider]  azelastine (ASTELIN) 0.1 % nasal spray Place 2 sprays into both nostrils 2 (two) times daily.  01/08/15   [provider]  cephALEXin (KEFLEX) 500 MG capsule Take 1 capsule (500 mg total) by mouth every 12 (twelve) hours. 10/21/18   Geradine Girt, DO  dextromethorphan-guaiFENesin (MUCINEX DM) 30-600 MG 12hr tablet Take 1 tablet by mouth 2 (two) times daily.     [provider]  L-Methylfolate-B12-B6-B2 (CEREFOLIN) 08-20-48-5 MG TABS TAKE 1 TABLET BY MOUTH EVERY DAY 03/30/18   Dennie Bible, NP  loratadine (CLARITIN) 10 MG tablet Take 10 mg by mouth daily.    [provider]  Multiple Vitamins-Minerals (CENTRUM SILVER PO) Take 1  tablet by mouth daily.     [provider]  nitrofurantoin, macrocrystal-monohydrate, (MACROBID) 100 MG capsule Take 100 mg by mouth 2 (two) times daily. 01/11/19   [provider]  Omega-3 Fatty Acids (FISH OIL PO) Take 1 capsule by mouth daily.    [provider]  pantoprazole (PROTONIX) 40 MG tablet TAKE 1 TABLET DAILY,30 MINUTES BEFORE DINNER. 12/15/18   Juanito Doom, MD  predniSONE (DELTASONE) 10 MG tablet TAKE 1 TABLET EVERY DAY WITH BREAKFAST. 10/23/18   Juanito Doom, MD  Resveratrol 100 MG CAPS Take 100 mg by mouth every evening.     [provider]  rivaroxaban (XARELTO) 10 MG TABS tablet Take 1 tablet (10 mg total) by mouth daily. 01/04/19   Mannam, Hart Robinsons, MD  rosuvastatin (CRESTOR) 5 MG tablet Take 5 mg by mouth at bedtime.    [provider]     Paulita Fujita, ACNP [facility]  Pager [phone] CCT: 55 min

## 2019-01-13 NOTE — Progress Notes (Signed)
PT Cancellation Note  Patient Details Name: [name] [name] MRN: [id number] DOB: [birth year]   Cancelled Treatment:    Reason Eval/Treat Not Completed: Medical issues which prohibited therapy.  Per RN, Crystal, PT should hold.  Pt on venturi mask, tachypenic.  PT to check back tomorrow.   Barbarann Ehlers Edita Weyenberg, PT, DPT  Acute Rehabilitation [phone] pager [phone] office  @ [facility]: [phone]     Harvie Heck 01/13/2019, 12:18 PM

## 2019-01-13 NOTE — Progress Notes (Signed)
[facility]                                                                              Patient Demographics  [name] [name], is a 84 y.o. male, DOB - 1929-05-04, Portal date - 01/12/2019   Admitting Physician No admitting provider for patient encounter.  Outpatient Primary MD for the patient is Kelton Pillar, MD  Outpatient specialists:   LOS - 1  days    Chief Complaint  Patient presents with   Back Pain   Rectal Bleeding       Brief summary  Patient is a 85 y.o. male with multiple medical comorbidities significant for but not limited to memory loss, idiopathic pulmonary fibrosis and chronic respiratory failure on 5 L home oxygen, history of PE on Xarelto, prostate cancer, CVA/TIA, AAA, and carotid artery stenosis who presented with low back pain,worsening cough, tachycardia, tachypnea and leukocytosis, and anemia with hemoglobin of 9.6. He recently completed levaquine for  pneumonia  His CT indicated his aneurysm sac has grown significantly over the past 15 months. Patients was seen by vascular surgery and was deemed not a candidate for emergent surgical repair at this time due to his age and com-morbidities.  Upon admission prior to transfer upstairs, while in the ED, patient's blood pressure started dropping slowly with continued tachycardia, increasing oxygen requirement.    Assessment & Plan    Principal Problem:   AAA (abdominal aortic aneurysm) (HCC) Active Problems:   IPF (idiopathic pulmonary fibrosis) (HCC)   UTI (urinary tract infection)   Vertebral compression fracture (HCC)   Community acquired pneumonia    Acute on chronic hypoxic respiratory failure/sepsis Secondary pneumonia History of IPF on chronic oxygen therapy Mucolytic's, antitussives as needed IV antibiotics Monitor chest x-ray with leukocytosis Patient high risk for decompensation -we will switch from telemetry bed to ICU bed-CCM consult Overall  long-term prognosis is poor -Palliative medicine consult  Sepsis Source pulmonary (pneumonia) and urinary: (UTI) Follow-up urinary culture as well as blood cultures Antibiotics Hemodynamic monitoring Code Status: full code DVT Prophylaxis:  SCD's Family Communication:  Disposition Plan: to be determined   Time Spent in minutes  35 minutes  Procedures:   Consultants:   PCCM  Antimicrobials:   Zithromax and ceftriaxone   Medications  Scheduled Meds:  aspirin EC  81 mg Oral Daily   Continuous Infusions:  azithromycin     cefTRIAXone (ROCEPHIN)  IV     PRN Meds:.acetaminophen **OR** acetaminophen, morphine injection   Antibiotics   Anti-infectives (From admission, onward)   Start     Dose/Rate Route Frequency Ordered Stop   01/13/19 2300  azithromycin (ZITHROMAX) 500 mg in sodium chloride 0.9 % 250 mL IVPB     500 mg 250 mL/hr over 60 Minutes Intravenous Every 24 hours 01/13/19 1121     01/13/19 2300  cefTRIAXone (ROCEPHIN) 2 g in sodium chloride 0.9 % 100 mL IVPB     2 g 200 mL/hr over 30 Minutes Intravenous Every 24 hours 01/13/19 1124     01/13/19 2200  levofloxacin (LEVAQUIN) IVPB 750 mg  Status:  Discontinued     750  mg 100 mL/hr over 90 Minutes Intravenous Every 24 hours 01/13/19 0047 01/13/19 1124   01/12/19 2200  levofloxacin (LEVAQUIN) IVPB 750 mg     750 mg 100 mL/hr over 90 Minutes Intravenous  Once 01/12/19 2155 01/13/19 0147        Subjective:   [name] [name] was seen and examined today.  Patient on Venturi mask, alert and responsive, denies chest pain, admits to shortness of breath, intermittent coughing Objective:   Vitals:   01/13/19 0930 01/13/19 0940 01/13/19 1000 01/13/19 1100  BP: 114/63 97/65 115/65 (!) 109/57  Pulse: (!) 124 (!) 120 (!) 121 (!) 112  Resp: (!) 22 (!) 21 (!) 28 (!) 25  Temp:      TempSrc:      SpO2: 92% 92% 91% 97%  Weight:      Height:        Intake/Output Summary (Last 24 hours) at 01/13/2019 1138 Last  data filed at 01/13/2019 0147 Gross per 24 hour  Intake 720.33 ml  Output --  Net 720.33 ml     Wt Readings from Last 3 Encounters:  01/12/19 68 kg  12/25/18 68 kg  12/21/18 68 kg     Exam  General: NAD  HEENT: NCAT,  PERRL,MMM  Neck: SUPPLE, (-) JVD  Cardiovascular: RRR, (-) GALLOP, (-) MURMUR  Respiratory: Diminished breath sounds, (+) rhonchi  Gastrointestinal: SOFT, (-) DISTENSION, BS(+), (_) TENDERNESS  Ext: (-) CYANOSIS, (-) EDEMA  Neuro: Mildly lethargic, no focal deficit    Data Reviewed:  I have personally reviewed following labs and imaging studies  Micro Results No results found for this or any previous visit (from the past 240 hour(s)).  Radiology Reports Ct Chest Wo Contrast  Result Date: 01/13/2019 CLINICAL DATA:  85 year old male with shortness of breath. EXAM: CT CHEST WITHOUT CONTRAST TECHNIQUE: Multidetector CT imaging of the chest was performed following the standard protocol without IV contrast. COMPARISON:  Chest CT dated 08/17/2018 and radiograph dated 10/20/2018. FINDINGS: Evaluation of this exam is limited in the absence of intravenous contrast. Cardiovascular: There is no cardiomegaly or pericardial effusion. Multi vessel coronary vascular calcifications noted. There is moderate atherosclerotic calcification of the thoracic aorta. The aorta is mildly tortuous. Mild dilatation of the main pulmonary trunk suggestive of a degree of pulmonary hypertension. Clinical correlation is recommended. Mediastinum/Nodes: No hilar or mediastinal adenopathy. The esophagus is grossly unremarkable. No mediastinal fluid collection. Lungs/Pleura: There is background of interstitial lung disease and fibrosis. Progression of consolidative change primarily involving the lingula since the prior CT of 08/17/2018. Although this may be related to progression of fibrosis, superimposed pneumonia is not excluded. Clinical correlation is recommended. There is no pleural effusion  or pneumothorax. The central airways remain patent. Upper Abdomen: No acute abnormality. Musculoskeletal: No chest wall mass or suspicious bone lesions identified. IMPRESSION: Advanced interstitial lung disease and fibrosis. Increased consolidative change in the lingula since the prior CT of 08/17/2018 may be related to progression of fibrosis, although superimposed pneumonia is not excluded. Clinical correlation is recommended. Aortic Atherosclerosis (ICD10-I70.0). Electronically Signed   By: Anner Crete M.D.   On: 01/13/2019 01:35   Ct Abdomen Pelvis W Contrast  Result Date: 01/12/2019 CLINICAL DATA:  Acute abdominal pain severe lower back pain and rectal bleeding, history of pulmonary fibrosis EXAM: CT ABDOMEN AND PELVIS WITH CONTRAST TECHNIQUE: Multidetector CT imaging of the abdomen and pelvis was performed using the standard protocol following bolus administration of intravenous contrast. CONTRAST:  127mL OMNIPAQUE IOHEXOL 300 MG/ML  SOLN COMPARISON:  February 03, 2017 FINDINGS: Lower chest: There is mild cardiomegaly. There is interval significant worsening in the interstitial thickening and reticulonodular patchy airspace opacities within both lungs. There is new airspace consolidation seen within the anterior left lower lobe/lingula. No pleural effusion is seen. Coronary artery calcifications are seen. No hiatal hernia. Hepatobiliary: The liver is normal in density without focal abnormality.The main portal vein is patent. No evidence of calcified gallstones, gallbladder wall thickening or biliary dilatation. Pancreas: Unremarkable. No pancreatic ductal dilatation or surrounding inflammatory changes. Spleen: Normal in size without focal abnormality. Adrenals/Urinary Tract: Both adrenal glands appear normal. The kidneys and collecting system appear normal without evidence of urinary tract calculus or hydronephrosis. Bladder is unremarkable. Stomach/Bowel: The stomach, small bowel, and colon are  normal in appearance. No inflammatory changes, wall thickening, or obstructive findings.There is a moderate amount of colonic stool. No focal area of bowel wall thickening or pneumatosis. Vascular/Lymphatic: There has been interval increased size in the infrarenal abdominal aortic aneurysm sac which now measures 7.2 x 5.8 cm (see 5.1 x 5.9 cm previously) E. There appears to be hyperdense material seen within the aneurysm sac more notable on the delayed image, consistent with endoleak. The contrast filling of the aneurysm sac more so on the delayed image in the posterior right aspect of the sac, likely due to a type 2 endoleak. There does appear to be patency however of the endograft. Reproductive: Radiation prostate seeds are seen. Other: No evidence of abdominal wall mass or hernia. Musculoskeletal: There is a new since the prior exam superior compression deformity of the L2 and L3 vertebral bodies with less than 25% loss in height. Chronic bilateral pars defects are seen at L5-S1 with a grade 2 anterolisthesis of L5-S1. IMPRESSION: 1. Increased size of the infrarenal abdominal aortic aneurysm sac measuring 7.2 x 5.8 cm with continued probable type 2 endoleak as there is increased contrast filling of the aneurysm sac posteriorly on delayed images. 2. Patent aorto bi-iliac stent graft. 3. Worsening chronic interstitial lung changes at both lung bases with new airspace patchy consolidation in the anterior left lower lobe/lingula which could be superimposed infection. 4. Age indeterminate, new since 2018, slight superior compression deformities of the L2 and L3 vertebral bodies with less than 25% loss of height. 5. Chronic pars defects at L5-S1 with grade 2 anterolisthesis of L5 on S1. 6. These results were called by telephone at the time of interpretation on 01/12/2019 at 8:16 pm to provider [facility] , who verbally acknowledged these results. Electronically Signed   By: Prudencio Pair M.D.   On: 01/12/2019  20:16   Dg Chest Portable 1 View  Result Date: 01/13/2019 CLINICAL DATA:  Tachycardia EXAM: PORTABLE CHEST 1 VIEW COMPARISON:  October 20, 2018 FINDINGS: There is extensive airspace opacity in the left mid lower lung zones. There are underlying areas of fibrosis throughout the lungs which is stable. There is mild cardiomegaly with pulmonary vascularity normal. No adenopathy. Bones are osteoporotic. IMPRESSION: Airspace opacity consistent with pneumonia in the left mid and lower lung zones, superimposed on chronic fibrotic change. Heart is mildly enlarged, stable. Pulmonary vascularity normal. Bones osteoporotic. Electronically Signed   By: Lowella Grip III M.D.   On: 01/13/2019 07:53   Dg Abd Portable 1v  Result Date: 01/13/2019 CLINICAL DATA:  History of abdominal aortic aneurysm. EXAM: PORTABLE ABDOMEN - 1 VIEW COMPARISON:  01/12/19 FINDINGS: The bowel gas pattern appears normal. Metallic scaffolding for infrarenal abdominal aortic stent graft is again  noted. Contrast material from recent enhanced CT of the abdomen is identified within the bladder. IMPRESSION: 1. Normal bowel gas pattern. 2. Stable appearance of infrarenal abdominal aortic stent graft. Electronically Signed   By: Kerby Moors M.D.   On: 01/13/2019 09:25    Lab Data:  CBC: Recent Labs  Lab 01/12/19 1801 01/13/19 0237  WBC 15.8* 14.1*  NEUTROABS 14.3*  --   HGB 9.6* 9.9*  HCT 30.6* 31.8*  MCV 93.0 93.8  PLT 243 0000000   Basic Metabolic Panel: Recent Labs  Lab 01/12/19 1801  NA 137  K 4.6  CL 104  CO2 25  GLUCOSE 123*  BUN 18  CREATININE 0.90  CALCIUM 8.6*   GFR: Estimated Creatinine Clearance: 55.6 mL/min (by C-G formula based on SCr of 0.9 mg/dL). Liver Function Tests: Recent Labs  Lab 01/12/19 1801  AST 71*  ALT 93*  ALKPHOS 174*  BILITOT 0.9  PROT 6.8  ALBUMIN 1.9*   No results for input(s): LIPASE, AMYLASE in the last 168 hours. No results for input(s): AMMONIA in the last 168  hours. Coagulation Profile: No results for input(s): INR, PROTIME in the last 168 hours. Cardiac Enzymes: No results for input(s): CKTOTAL, CKMB, CKMBINDEX, TROPONINI in the last 168 hours. BNP (last 3 results) No results for input(s): PROBNP in the last 8760 hours. HbA1C: No results for input(s): HGBA1C in the last 72 hours. CBG: No results for input(s): GLUCAP in the last 168 hours. Lipid Profile: No results for input(s): CHOL, HDL, LDLCALC, TRIG, CHOLHDL, LDLDIRECT in the last 72 hours. Thyroid Function Tests: No results for input(s): TSH, T4TOTAL, FREET4, T3FREE, THYROIDAB in the last 72 hours. Anemia Panel: No results for input(s): VITAMINB12, FOLATE, FERRITIN, TIBC, IRON, RETICCTPCT in the last 72 hours. Urine analysis:    Component Value Date/Time   COLORURINE AMBER (A) 01/12/2019 2201   APPEARANCEUR HAZY (A) 01/12/2019 2201   LABSPEC 1.036 (H) 01/12/2019 2201   PHURINE 5.0 01/12/2019 2201   GLUCOSEU NEGATIVE 01/12/2019 2201   HGBUR NEGATIVE 01/12/2019 2201   BILIRUBINUR NEGATIVE 01/12/2019 2201   [place] 01/12/2019 2201   PROTEINUR 30 (A) 01/12/2019 2201   UROBILINOGEN 0.2 04/25/2013 1449   NITRITE POSITIVE (A) 01/12/2019 2201   LEUKOCYTESUR LARGE (A) 01/12/2019 2201     Benito Mccreedy M.D. [facility] 01/13/2019, 11:38 AM  Pager: [phone] Between 7am to 7pm - call Pager - [phone]  After 7pm go to [email or web address] - password [email or web address]  Call night coverage person covering after 7pm

## 2019-01-13 NOTE — Consult Note (Signed)
Hospital Consult    Reason for Consult: Increasing abdominal aortic aneurysm sac size with type II endoleak after previous repair Referring Physician: ED MRN #:  [id number]  History of Present Illness: This is a 85 y.o. male with history of memory loss, pulmonary fibrosis with chronic respiratory failure on home oxygen 5 L nasal cannula, history of PE on Xarelto, known abdominal aortic aneurysm status post endovascular repair (Dr. Donzetta Matters 06/17/16) that vascular surgery has been consulted for increasing abdominal aortic sac size with type II endoleak.  Patient initially presented here to the ED last night with complaints of lower back pain x several months, respiratory issues as well as rectal bleeding.  Ultimately was diagnosed with community-acquired pneumonia and has been put on additional oxygen support with plan to start antibiotics and an elevated white count.  Patient has significant cognitive deficits and unable to get an accurate history.  There is no other family at bedside.  When I palpated his aneurysm he denies any pain.  Unable to tell me where he is or why he is here.  Past Medical History:  Diagnosis Date  . AAA (abdominal aortic aneurysm) ([place])   . Allergy   . Carotid artery stenosis   . Cognitive decline   . Diplopia   . GERD (gastroesophageal reflux disease)   . Hyperlipidemia   . Memory loss   . Prostate cancer ([place])   . Pulmonary fibrosis ([place])   . Spondylisthesis 2015   Spine  . Stroke ([place]) 04/18/13   some weakness left-returning strength  . TIA (transient ischemic attack)     Past Surgical History:  Procedure Laterality Date  . ABDOMINAL AORTIC ENDOVASCULAR STENT GRAFT N/A 06/17/2016   Procedure: ABDOMINAL AORTIC ENDOVASCULAR STENT GRAFT;  Surgeon: Waynetta Sandy, MD;  Location: [facility] OR;  Service: Vascular;  Laterality: N/A;  . CAROTID ENDARTERECTOMY  Sept. 2008   Elective left carotid endarterectomy  . CAROTID ENDARTERECTOMY Right 04/27/2013   CE  .  ENDARTERECTOMY Right 04/27/2013   Procedure: ENDARTERECTOMY CAROTID;  Surgeon: Mal Misty, MD;  Location: [place];  Service: Vascular;  Laterality: Right;  . EYE SURGERY     Bilateral cataract   . FOOT SURGERY      left Achilles Tendon Repair  . seed implantation  2001   for prostate ca    Allergies  Allergen Reactions  . Sudafed [Pseudoephedrine Hcl]     Causes dizziness and nervousness  . Atorvastatin Other (See Comments)    Reaction: Patient's family reports "confusion" and would prefer crestor     Prior to Admission medications   Medication Sig Start Date End Date Taking? Authorizing Provider  acetaminophen (TYLENOL) 500 MG tablet Take 1,000 mg by mouth every 6 (six) hours as needed for mild pain or fever.     [provider]  azelastine (ASTELIN) 0.1 % nasal spray Place 2 sprays into both nostrils 2 (two) times daily.  01/08/15   [provider]  cephALEXin (KEFLEX) 500 MG capsule Take 1 capsule (500 mg total) by mouth every 12 (twelve) hours. 10/21/18   Geradine Girt, DO  dextromethorphan-guaiFENesin (MUCINEX DM) 30-600 MG 12hr tablet Take 1 tablet by mouth 2 (two) times daily.     [provider]  L-Methylfolate-B12-B6-B2 (CEREFOLIN) 08-20-48-5 MG TABS TAKE 1 TABLET BY MOUTH EVERY DAY 03/30/18   Dennie Bible, NP  loratadine (CLARITIN) 10 MG tablet Take 10 mg by mouth daily.    [provider]  Multiple Vitamins-Minerals (CENTRUM SILVER  PO) Take 1 tablet by mouth daily.     [provider]  nitrofurantoin, macrocrystal-monohydrate, (MACROBID) 100 MG capsule Take 100 mg by mouth 2 (two) times daily. 01/11/19   [provider]  Omega-3 Fatty Acids (FISH OIL PO) Take 1 capsule by mouth daily.    [provider]  pantoprazole (PROTONIX) 40 MG tablet TAKE 1 TABLET DAILY,30 MINUTES BEFORE DINNER. 12/15/18   Juanito Doom, MD  predniSONE (DELTASONE) 10 MG tablet TAKE 1 TABLET EVERY DAY WITH BREAKFAST. 10/23/18    Juanito Doom, MD  Resveratrol 100 MG CAPS Take 100 mg by mouth every evening.     [provider]  rivaroxaban (XARELTO) 10 MG TABS tablet Take 1 tablet (10 mg total) by mouth daily. 01/04/19   Mannam, Hart Robinsons, MD  rosuvastatin (CRESTOR) 5 MG tablet Take 5 mg by mouth at bedtime.    [provider]    Social History   Socioeconomic History  . Marital status: Married    Spouse name: lee  . Number of children: 2  . Years of education: college  . Highest education level: Not on file  Occupational History  . Occupation: retired    Fish farm manager: RETIRED    Comment: [employer] [place]  . Financial resource strain: Not on file  . Food insecurity    Worry: Not on file    Inability: Not on file  . Transportation needs    Medical: Not on file    Non-medical: Not on file  Tobacco Use  . Smoking status: Former Smoker    Packs/day: 1.00    Years: 14.00    Pack years: 14.00    Types: Cigarettes    Quit date: 03/27/1965    Years since quitting: 53.8  . Smokeless tobacco: Never Used  Substance and Sexual Activity  . Alcohol use: Yes    Alcohol/week: 1.0 standard drinks    Types: 1 Shots of liquor per week    Comment: 3-4 drinks week bourbon  . Drug use: No  . Sexual activity: Not Currently  Lifestyle  . Physical activity    Days per week: Not on file    Minutes per session: Not on file  . Stress: Not on file  Relationships  . Social Herbalist on phone: Not on file    Gets together: Not on file    Attends religious service: Not on file    Active member of club or organization: Not on file    Attends meetings of clubs or organizations: Not on file    Relationship status: Not on file  . Intimate partner violence    Fear of current or ex partner: Not on file    Emotionally abused: Not on file    Physically abused: Not on file    Forced sexual activity: Not on file  Other Topics Concern  . Not on file  Social History Narrative  .  Not on file     Family History  Problem Relation Age of Onset  . Stroke Mother   . Heart disease Mother        After age 73  . Varicose Veins Mother   . Hypertension Mother   . Cancer Mother        Theadora Rama   . Stroke Father   . Heart attack Father   . Deep vein thrombosis Father   . Anuerysm Father   . Heart disease Brother  ROS: [x]  Positive   [ ]  Negative   [ ]  All sytems reviewed and are negative  Cardiovascular: []  chest pain/pressure []  palpitations []  SOB lying flat []  DOE []  pain in legs while walking []  pain in legs at rest []  pain in legs at night []  non-healing ulcers []  hx of DVT []  swelling in legs  Pulmonary: []  productive cough []  asthma/wheezing []  home O2  Neurologic: []  weakness in []  arms []  legs []  numbness in []  arms []  legs []  hx of CVA []  mini stroke [] difficulty speaking or slurred speech []  temporary loss of vision in one eye []  dizziness  Hematologic: []  hx of cancer []  bleeding problems []  problems with blood clotting easily  Endocrine:   []  diabetes []  thyroid disease  GI []  vomiting blood []  blood in stool  GU: []  CKD/renal failure []  HD--[]  M/W/F or []  T/T/S []  burning with urination []  blood in urine  Psychiatric: []  anxiety []  depression  Musculoskeletal: []  arthritis []  joint pain  Integumentary: []  rashes []  ulcers  Constitutional: []  fever []  chills   Physical Examination  Vitals:   01/13/19 0940 01/13/19 1000  BP: 97/65 115/65  Pulse: (!) 120 (!) 121  Resp: (!) 21 (!) 28  Temp:    SpO2: 92% 91%   Body mass index is 21.51 kg/m.  General:  Non-rebreather. Gait: Not observed HENT: WNL, normocephalic Pulmonary: mildly increased WOB, without Rales, rhonchi,  wheezing Cardiac: regular, without  Murmurs, rubs or gallops Abdomen: soft, NT/ND, no masses; no pain with palpation of aneurysm Vascular Exam/Pulses: 2+ palpable femoral pulses bilaterally 2+ palpable dorsalis pedis pulses  bilaterally Extremities: without ischemic changes, without Gangrene Musculoskeletal: no muscle wasting or atrophy  Neurologic: A&O X 1 (self)   CBC    Component Value Date/Time   WBC 14.1 (H) 01/13/2019 0237   RBC 3.39 (L) 01/13/2019 0237   HGB 9.9 (L) 01/13/2019 0237   HCT 31.8 (L) 01/13/2019 0237   PLT 249 01/13/2019 0237   MCV 93.8 01/13/2019 0237   MCH 29.2 01/13/2019 0237   MCHC 31.1 01/13/2019 0237   RDW 16.1 (H) 01/13/2019 0237   LYMPHSABS 1.0 01/12/2019 1801   MONOABS 0.4 01/12/2019 1801   EOSABS 0.0 01/12/2019 1801   BASOSABS 0.0 01/12/2019 1801    BMET    Component Value Date/Time   NA 137 01/12/2019 1801   K 4.6 01/12/2019 1801   CL 104 01/12/2019 1801   CO2 25 01/12/2019 1801   GLUCOSE 123 (H) 01/12/2019 1801   BUN 18 01/12/2019 1801   CREATININE 0.90 01/12/2019 1801   CALCIUM 8.6 (L) 01/12/2019 1801   GFRNONAA >60 01/12/2019 1801   GFRAA >60 01/12/2019 1801    COAGS: Lab Results  Component Value Date   INR 1.26 06/17/2016   INR 1.12 06/11/2016   INR 1.13 04/25/2013     Non-Invasive Vascular Imaging:     CT abdomen pelvis with contrast 01/12/2019: On my review there is a patent aortobiiliac stent graft.  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  Maximal sac diameter now measures 7.4 cm on coronal view.  ASSESSMENT/PLAN: This is a 85 y.o. male with multiple medical comorbidities as noted above that is being admitted for community-acquired pneumonia with some respiratory issues as well as reported rectal bleeding?  Vascular surgery was asked to evaluate expanding aortic aneurysm sac size in the setting of known type II endoleak.  Patient previously  underwent aorta biiliac endovascular stent graft with Dr. Donzetta Matters on 06/17/2016 for repair of his aneurysm.  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  He was last seen 09/27/2017 at which time the EVAR duplex demonstrated persistent  endoleak and the maximal aortic diameter at that time was 6 cm.  On his CT this admission his aneurysm sac has grown significantly over the past 15 months.  I now measure this at 7.4 cm on coronal view.  There is no emergent indication for surgical repair at this time.  I will notify Dr. Donzetta Matters of the patient's admission on Monday and let him decide course of treatment moving forward.  Patient is 85 years old and appears very frail and deconditioned and has significant cognitive deficits.  Will have to make a decision about how aggressive to be treating this endoleak.  Marty Heck, MD [facility] Office: [phone] Pager: [place]

## 2019-01-13 NOTE — ED Notes (Signed)
MD paged regarding HR increase to 138 and SpO2 of in high 70's.

## 2019-01-14 DIAGNOSIS — J189 Pneumonia, unspecified organism: Secondary | ICD-10-CM | POA: Diagnosis not present

## 2019-01-14 DIAGNOSIS — Z7189 Other specified counseling: Secondary | ICD-10-CM | POA: Diagnosis not present

## 2019-01-14 DIAGNOSIS — Z515 Encounter for palliative care: Secondary | ICD-10-CM | POA: Diagnosis not present

## 2019-01-14 DIAGNOSIS — I714 Abdominal aortic aneurysm, without rupture: Secondary | ICD-10-CM | POA: Diagnosis not present

## 2019-01-14 DIAGNOSIS — J84112 Idiopathic pulmonary fibrosis: Secondary | ICD-10-CM | POA: Diagnosis not present

## 2019-01-14 LAB — RESPIRATORY PANEL BY PCR

## 2019-01-14 LAB — PROCALCITONIN: Procalcitonin: 0.89 ng/mL

## 2019-01-14 LAB — GLUCOSE, CAPILLARY
Glucose-Capillary: 117 mg/dL — ABNORMAL HIGH (ref 70–99)
Glucose-Capillary: 122 mg/dL — ABNORMAL HIGH (ref 70–99)
Glucose-Capillary: 118 mg/dL — ABNORMAL HIGH (ref 70–99)
Glucose-Capillary: 211 mg/dL — ABNORMAL HIGH (ref 70–99)
Glucose-Capillary: 162 mg/dL — ABNORMAL HIGH (ref 70–99)

## 2019-01-14 LAB — NOVEL CORONAVIRUS, NAA (HOSP ORDER, SEND-OUT TO REF LAB; TAT 18-24 HRS): SARS-CoV-2, NAA: NOT DETECTED

## 2019-01-14 MED ORDER — HALOPERIDOL LACTATE 5 MG/ML IJ SOLN
1.0000 mg | Freq: Once | INTRAMUSCULAR | Status: AC
  Administered 2019-01-14: 1 mg via INTRAVENOUS
  Filled 2019-01-14: qty 1

## 2019-01-14 MED ORDER — DEXMEDETOMIDINE HCL IN NACL 400 MCG/100ML IV SOLN
0.4000 ug/kg/h | INTRAVENOUS | Status: DC
  Administered 2019-01-14: 0.4 ug/kg/h via INTRAVENOUS
  Filled 2019-01-14 (×2): qty 100

## 2019-01-14 MED ORDER — METOPROLOL TARTRATE 25 MG PO TABS
25.0000 mg | ORAL_TABLET | Freq: Two times a day (BID) | ORAL | Status: DC
  Administered 2019-01-14 – 2019-01-18 (×7): 25 mg via ORAL
  Filled 2019-01-14 (×8): qty 1

## 2019-01-14 MED ORDER — ENSURE ENLIVE PO LIQD
237.0000 mL | Freq: Two times a day (BID) | ORAL | Status: DC
  Administered 2019-01-15 – 2019-01-18 (×3): 237 mL via ORAL

## 2019-01-14 MED ORDER — HALOPERIDOL LACTATE 5 MG/ML IJ SOLN
0.5000 mg | Freq: Once | INTRAMUSCULAR | Status: AC
  Administered 2019-01-14: 03:00:00 0.5 mg via INTRAVENOUS
  Filled 2019-01-14: qty 1

## 2019-01-14 MED ORDER — LACTATED RINGERS IV BOLUS
500.0000 mL | Freq: Once | INTRAVENOUS | Status: AC
  Administered 2019-01-14: 500 mL via INTRAVENOUS

## 2019-01-14 NOTE — Progress Notes (Signed)
RN updated MD on low UOP. Pt has had 125cc of urine in 10 hours of shift. MD put in orders for LR 500cc bolus and LR infusion to be increased to 100cc/hr.

## 2019-01-14 NOTE — Evaluation (Signed)
Physical Therapy Evaluation Patient Details Name: [name] [name] MRN: [id number] DOB: [birth year] Today's Date: 01/14/2019   History of Present Illness  Pt is a 85 yo male with PMH significant for memory loss, chronic respiratory failure in setting of idiopathic pulmonary fibrosis on 5 L [place] at home, recent PE on Xarelto, AAA, GERD, being admitted with sepsis in setting of PNA/UTI and increasing aneurysm.Initial work up revealed increasing size of infrarenal abdominal aortic aneurysm and patch consolidation of the left lower lobe concerning for PNA. Vascular evaluated, no emergent indication for OR. Also found to have compression deformities of L2, L3.  Clinical Impression  Pt admitted with above diagnosis. Pt presents with decreased functional mobility secondary to generalized weakness, decreased cognition/safety awareness, decreased cardiovascular endurance, balance impairments. Pt requiring moderate assist for transfers; unable to ambulate. SpO2 96-100% on 5 L O2. Prior to admission, pt living with his wife, uses wheelchair for mobility and requires assist for ADL's and transfers. Pt wife reports it has been difficult at home and he has had multiple falls. Pt currently with functional limitations due to the deficits listed below (see PT Problem List). Pt will benefit from skilled PT to increase their independence and safety with mobility to allow discharge to the venue listed below.       Follow Up Recommendations SNF;Supervision/Assistance - 24 hour    Equipment Recommendations  None recommended by PT    Recommendations for Other Services OT consult     Precautions / Restrictions Precautions Precautions: Fall Restrictions Weight Bearing Restrictions: No      Mobility  Bed Mobility Overal bed mobility: Needs Assistance Bed Mobility: Rolling;Sidelying to Sit;Sit to Sidelying Rolling: Min assist Sidelying to sit: Mod assist     Sit to sidelying: Mod assist General bed  mobility comments: MinA for rolling towards left, modA for trunk elevation and guidance of BLE's off of bed  Transfers Overall transfer level: Needs assistance Equipment used: None Transfers: Sit to/from Stand Sit to Stand: Mod assist         General transfer comment: Able to stand with modA with increased bilateral knee flexion, able to take side steps with significant time and effort  Ambulation/Gait                Stairs            Wheelchair Mobility    Modified Rankin (Stroke Patients Only)       Balance Overall balance assessment: Needs assistance Sitting-balance support: Feet supported Sitting balance-Leahy Scale: Fair     Standing balance support: Single extremity supported;During functional activity Standing balance-Leahy Scale: Poor Standing balance comment: reliant on external support                             Pertinent Vitals/Pain Pain Assessment: Faces Faces Pain Scale: Hurts little more Pain Location: back with movement Pain Descriptors / Indicators: Grimacing Pain Intervention(s): Monitored during session;Repositioned    Home Living Family/patient expects to be discharged to:: Private residence Living Arrangements: Spouse/significant other Available Help at Discharge: Family;Available 24 hours/day Type of Home: House Home Access: Level entry     Home Layout: One level Home Equipment: Grab bars - tub/shower;Wheelchair - [employer] - 2 wheels;Bedside commode      Prior Function Level of Independence: Needs assistance   Gait / Transfers Assistance Needed: pt wife assists with bed mobility, transfers into and out of wheelchair  ADL's / Homemaking Assistance Needed: pt wife  assists with ADL's        Hand Dominance   Dominant Hand: Right    Extremity/Trunk Assessment   Upper Extremity Assessment Upper Extremity Assessment: Generalized weakness    Lower Extremity Assessment Lower Extremity Assessment:  Generalized weakness    Cervical / Trunk Assessment Cervical / Trunk Assessment: Kyphotic  Communication   Communication: No difficulties  Cognition Arousal/Alertness: Awake/alert Behavior During Therapy: WFL for tasks assessed/performed Overall Cognitive Status: History of cognitive impairments - at baseline                                 General Comments: Pt pleasant, following simple commands with multimodal cueing. Fidgeting with lines, needs hand over hand guidance for redirection to task. Easily distractable. Pt wife reports he is more confused than baseline.      General Comments      Exercises     Assessment/Plan    PT Assessment Patient needs continued PT services  PT Problem List Decreased strength;Decreased activity tolerance;Decreased balance;Decreased mobility;Decreased cognition;Decreased safety awareness       PT Treatment Interventions Functional mobility training;Therapeutic activities;Therapeutic exercise;Balance training;Patient/family education    PT Goals (Current goals can be found in the Care Plan section)  Acute Rehab PT Goals Patient Stated Goal: pt wife would like his back pain to be controlled PT Goal Formulation: With family Time For Goal Achievement: 01/28/19 Potential to Achieve Goals: Fair    Frequency Min 2X/week   Barriers to discharge        Co-evaluation               AM-PAC PT "6 Clicks" Mobility  Outcome Measure Help needed turning from your back to your side while in a flat bed without using bedrails?: A Little Help needed moving from lying on your back to sitting on the side of a flat bed without using bedrails?: A Lot Help needed moving to and from a bed to a chair (including a wheelchair)?: A Lot Help needed standing up from a chair using your arms (e.g., wheelchair or bedside chair)?: A Lot Help needed to walk in hospital room?: Total Help needed climbing 3-5 steps with a railing? : Total 6 Click  Score: 11    End of Session Equipment Utilized During Treatment: Gait belt Activity Tolerance: Patient tolerated treatment well Patient left: in bed;with call bell/phone within reach;with bed alarm set Nurse Communication: Mobility status PT Visit Diagnosis: Difficulty in walking, not elsewhere classified (R26.2);Other abnormalities of gait and mobility (R26.89);Muscle weakness (generalized) (M62.81)    Time: [id number] PT Time Calculation (min) (ACUTE ONLY): 26 min   Charges:   PT Evaluation $PT Eval Moderate Complexity: 1 Mod PT Treatments $Therapeutic Activity: 8-22 mins        Ellamae Sia, PT, DPT Acute Rehabilitation Services Pager [phone] Office [phone]   Willy Eddy 01/14/2019, 4:39 PM

## 2019-01-14 NOTE — Progress Notes (Signed)
NAME:  [name] [name], MRN:  [id number], DOB:  [birth year], LOS: 2 ADMISSION DATE:  01/12/2019, CONSULTATION DATE: 10/24 REFERRING MD: ED doc, CHIEF COMPLAINT: Pneumonia, urinary tract infection, respiratory failure  Brief History   85 yo male with PMH significant for memory loss, chronic respiratory failure in setting of idiopathic pulmonary fibrosis on 5 L [place] at home, recent PE on Xarelto, AAA, GERD, being admitted with sepsis in setting of PNA/UTI and increasing aneurysm. Vascular evaluated, no emergent indication for OR.   History of present illness   Patient seen in clinic 10/1 for 4 month anticoagulation follow up in setting of PE.  Follow up virtual visit on 10/15. CT Chest at that time with progression of IPF.  Family had elected previously to not pursue anti-fibrotics.  Due to recurrent falls, his Xarelto dose was lowered to 10 mg daily.  COVID testing completed on 10/13 due to increased cough.    Patient presented to ED yesterday for persistent lower back pain and worsening cough.  Initial work up revealed increasing size of infrarenal abdominal aortic aneurysm and patch consolidation of the left lower lobe concerning for PNA.  UA suspicious for UTI as well.  Also found to have compression deformities of the L2, L3 vertebral bodies.  He received Levaquin in the ED for antimicrobial coverage.  He remained in the ED awaiting inpatient bed.  He was noted to have marginal BP, tachycardia and increasing O2 requirements. Therefore, PCCM consulted to admit to ICU. Wife at bedside, states he recently completed a course of Levaquin.  Patient is lethargic but arousable and will follow commands and attempt to participate in exam, however confused. No focal deficits on exam.   Past Medical History  IPF (prednisone), family decided to not pursue anti-fibrotics Chronic hypoxemia: 5 L [place] Subsegmental PE (on Xarelto) GERD Stroke AAA CAD Memory Loss/Coginitive Decline   Significant Hospital  Events   Admitting 10/24  Consults:  PCCM Vascular  Procedures:  Not applicable  Significant Diagnostic Tests:  10/24 CT scan of the chest Advanced interstitial lung disease and fibrosis. Increased consolidative change in the lingula since the prior CT of 08/17/2018 may be related to progression of fibrosis, although superimposed pneumonia is not excluded. Clinical correlation is recommended.  Aortic Atherosclerosis (ICD10-I70.0). Micro Data:  Urine Cx 10/24 >>Advanced ILD/F.  Increased consolidation in the lingula.    Antimicrobials:  Rocephin 10/24 >> Azithro 10/24 >>    Interim history/subjective:  Resting comfortably in bed Did have episodes of delirium for which he received Haldol  Objective   Blood pressure (!) 142/73, pulse 72, temperature 97.7 F (36.5 C), temperature source Axillary, resp. rate (!) 22, height 5\' 10"  (1.778 m), weight 68 kg, SpO2 100 %.        Intake/Output Summary (Last 24 hours) at 01/14/2019 0820 Last data filed at 01/14/2019 0800 Gross per 24 hour  Intake 1072.83 ml  Output 300 ml  Net 772.83 ml   Filed Weights   01/12/19 1713  Weight: 68 kg    Examination: General: Awake and alert, frail HENT: Dry oral mucosa Lungs: Bibasal rales Cardiovascular: S1-S2 appreciated Abdomen: Soft, bowel sounds appreciated Extremities: No clubbing, no edema Neuro: Alert and oriented x2, following commands GU:   Resolved Hospital Problem list     Assessment & Plan:   Acute on chronic respiratory failure Idiopathic pulmonary fibrosis-advanced disease Pneumonia -Continue antibiotics -Continue oxygen supplementation -Steroids  Pneumonia -Empiric Rocephin/azithromycin  Hypoxemic respiratory failure -Oxygen supplementation to keep saturations greater than  89%  AAA -Previous aorto iliac endovascular stent graft by Dr. Donzetta Matters on 06/17/2016 -Has known endoleak -Increase in size of endoleak from previous -Not a surgical candidate -Patient  placed on beta-blockers -Blood pressure control  Risk of decompensation from his comorbidities remains very high Patient is DNR-spoke with his spouse extensively on 01/13/2019 Continue antibiotics  Goals of care -Palliative care was consulted -Patient is DNR -We will transfer to medical floor today  Best practice:  Diet: Patient was kept n.p.o., will initiate diet as tolerated Pain/Anxiety/Delirium protocol (if indicated): Received Haldol last night VAP protocol (if indicated): Not applicable DVT prophylaxis: SCD GI prophylaxis: Protonix Glucose control: Not applicable Mobility: Bedrest Code Status: DNR Family Communication: Will update Disposition: Transfer to medical floor  Labs   CBC: Recent Labs  Lab 01/12/19 1801 01/13/19 0237  WBC 15.8* 14.1*  NEUTROABS 14.3*  --   HGB 9.6* 9.9*  HCT 30.6* 31.8*  MCV 93.0 93.8  PLT 243 0000000    Basic Metabolic Panel: Recent Labs  Lab 01/12/19 1801  NA 137  K 4.6  CL 104  CO2 25  GLUCOSE 123*  BUN 18  CREATININE 0.90  CALCIUM 8.6*   GFR: Estimated Creatinine Clearance: 55.6 mL/min (by C-G formula based on SCr of 0.9 mg/dL). Recent Labs  Lab 01/12/19 1801 01/13/19 0220 01/13/19 0237 01/13/19 1829 01/14/19 0414  PROCALCITON  --   --   --  0.98 0.89  WBC 15.8*  --  14.1*  --   --   LATICACIDVEN  --  1.0  --   --   --     Liver Function Tests: Recent Labs  Lab 01/12/19 1801  AST 71*  ALT 93*  ALKPHOS 174*  BILITOT 0.9  PROT 6.8  ALBUMIN 1.9*   No results for input(s): LIPASE, AMYLASE in the last 168 hours. No results for input(s): AMMONIA in the last 168 hours.  ABG    Component Value Date/Time   PHART 7.440 06/11/2016 1610   PCO2ART 37.8 06/11/2016 1610   PO2ART 62.9 (L) 06/11/2016 1610   HCO3 25.3 06/11/2016 1610   O2SAT 91.8 06/11/2016 1610     The patient is critically ill with multiple organ systems failure and requires high complexity decision making for assessment and support, frequent  evaluation and titration of therapies, application of advanced monitoring technologies and extensive interpretation of multiple databases. Critical Care Time devoted to patient care services described in this note independent of APP/resident time (if applicable)  is 30 minutes.   Sherrilyn Rist MD [facility] Personal pager: [phone] If unanswered, please page CCM On-call: [phone]

## 2019-01-14 NOTE — Plan of Care (Signed)
  Problem: Elimination: Goal: Will not experience complications related to bowel motility Outcome: Progressing Goal: Will not experience complications related to urinary retention Outcome: Progressing   Problem: Pain Managment: Goal: General experience of comfort will improve Outcome: Progressing   Problem: Nutrition: Goal: Adequate nutrition will be maintained Outcome: Not Progressing Note: Not currently on a diet. Will address with MD

## 2019-01-14 NOTE — Progress Notes (Signed)
RN collaborated with Wife and MD. It was decided that due to decreased appetite, we will leave an NPO order in since wife does not think he will eat three meals a day. MD said Ensure can be ordered if patient is up to drinking it. Pt can also have apple sauce and ice cream as tolerated.

## 2019-01-14 NOTE — Progress Notes (Signed)
[facility] Progress Note Patient Name: Kabir Borell DOB: 08-14-1929 MRN: [id number]   Date of Service  01/14/2019  HPI/Events of Note  Delirium - QTc interval = 320 milliseconds.   eICU Interventions  Will order: 1. Haldol 0.5 mg IV now.      Intervention Category Major Interventions: Delirium, psychosis, severe agitation - evaluation and management  Keziyah Kneale Eugene 01/14/2019, 3:21 AM

## 2019-01-14 NOTE — Progress Notes (Signed)
Informed about increased restlessness  Give Haldol 1 mg IV x1

## 2019-01-14 NOTE — Consult Note (Signed)
Consultation Note Date: 01/14/2019   Patient Name: [name] [name]  DOB: [birth year]  MRN: [id number]  Age / Sex: 85 y.o., male  PCP: Kelton Pillar, MD Referring Physician: Laurin Coder, MD  Reason for Consultation: Establishing goals of care  HPI/Patient Profile: 85 y.o. male  with past medical history of idiopathic pulmonary fibrosis on 5 L oxygen at home, PE on xarelto, AAA, and GERD admitted on 01/12/2019 with persistent lower back pain and cough. Diagnosed with sepsis in setting of pna/uti and increasing AAA. BP soft, tachycardic, and increasing oxygen needs warranted admission to ICU. Patient receiving IV antibiotics and increased oxygen support. No plans for surgical intervention for AAA. PMT consulted for [place].  Clinical Assessment and Goals of Care: I have reviewed medical records including EPIC notes, and labs and imaging.  I then spoke with patient's wife, [name],  to discuss diagnosis prognosis, [place], EOL wishes, disposition and options.  I introduced Palliative Medicine as specialized medical care for people living with serious illness. It focuses on providing relief from the symptoms and stress of a serious illness. The goal is to improve quality of life for both the patient and the family.  As far as functional and nutritional status, [name] tells me of a decline. Patient has been unable to move himself - he is dependent on her for ADLs. She speaks of the difficulty of managing his care - she does have help in the home 2 days a week. She also speaks of poor appetite - refusing food some days. She attempts to supplement intake with ensure and milkshakes but some days he refuses these as well.    We discussed his current illness and what it means in the larger context of his on-going co-morbidities.  She shares about her extensive discussions with providers and good understanding of pulmonary fibrosis and AAA.  I attempted to  elicit values and goals of care important to the patient.  She tells me she is having difficulty speaking about these things this evening - she has been at the hospital all day - she asks if we can speak again in the morning to further discuss goals of care. We agreed to speak at 9 am.  We also briefly discussed disposition options - rehab facility vs home with home health vs home with hospice. She tells me she has considered each of these - she would specifically like to speak in more detail about hospice services tomorrow morning - we briefly discussed hospice philosophy of care - focus on quality of life and symptom management. She indicates her interested in this type of care.   Questions and concerns were addressed. The family was encouraged to call with questions or concerns.   Primary Decision Maker NEXT OF KIN - wife Harden Mo [name]) Jafari  SUMMARY OF RECOMMENDATIONS   - follow up planned for 10/26 9 am to further discuss goals of care and disposition - wife expressed interest in hearing more about hospice services  Code Status/Advance Care Planning:  DNR  Psycho-social/Spiritual:   Desire for further Chaplaincy support:no  Additional Recommendations: Education on Hospice  Prognosis:   < 6 months  Discharge Planning: To Be Determined      Primary Diagnoses: Present on Admission: . AAA (abdominal aortic aneurysm) ([place]) . IPF (idiopathic pulmonary fibrosis) ([place]) . Pneumonia   I have reviewed the medical record, interviewed the patient and family, and examined the patient. The following aspects are pertinent.  Past Medical History:  Diagnosis Date  .  AAA (abdominal aortic aneurysm) ([place])   . Allergy   . Carotid artery stenosis   . Cognitive decline   . Diplopia   . GERD (gastroesophageal reflux disease)   . Hyperlipidemia   . Memory loss   . Prostate cancer ([place])   . Pulmonary fibrosis ([place])   . Spondylisthesis 2015   Spine  . Stroke ([place]) 04/18/13   some  weakness left-returning strength  . TIA (transient ischemic attack)    Social History   Socioeconomic History  . Marital status: Married    Spouse name: lee  . Number of children: 2  . Years of education: college  . Highest education level: Not on file  Occupational History  . Occupation: retired    Fish farm manager: RETIRED    Comment: [employer]  . Financial resource strain: Not on file  . Food insecurity    Worry: Not on file    Inability: Not on file  . Transportation needs    Medical: Not on file    Non-medical: Not on file  Tobacco Use  . Smoking status: Former Smoker    Packs/day: 1.00    Years: 14.00    Pack years: 14.00    Types: Cigarettes    Quit date: 03/27/1965    Years since quitting: 53.8  . Smokeless tobacco: Never Used  Substance and Sexual Activity  . Alcohol use: Yes    Alcohol/week: 1.0 standard drinks    Types: 1 Shots of liquor per week    Comment: 3-4 drinks week bourbon  . Drug use: No  . Sexual activity: Not Currently  Lifestyle  . Physical activity    Days per week: Not on file    Minutes per session: Not on file  . Stress: Not on file  Relationships  . Social Herbalist on phone: Not on file    Gets together: Not on file    Attends religious service: Not on file    Active member of club or organization: Not on file    Attends meetings of clubs or organizations: Not on file    Relationship status: Not on file  Other Topics Concern  . Not on file  Social History Narrative  . Not on file   Family History  Problem Relation Age of Onset  . Stroke Mother   . Heart disease Mother        After age 33  . Varicose Veins Mother   . Hypertension Mother   . Cancer Mother        Theadora Rama   . Stroke Father   . Heart attack Father   . Deep vein thrombosis Father   . Anuerysm Father   . Heart disease Brother    Scheduled Meds: . chlorhexidine  15 mL Mouth Rinse BID  . Chlorhexidine Gluconate Cloth  6 each  Topical Daily  . [START ON 01/15/2019] feeding supplement (ENSURE ENLIVE)  237 mL Oral BID BM  . mouth rinse  15 mL Mouth Rinse q12n4p  . methylPREDNISolone (SOLU-MEDROL) injection  40 mg Intravenous Q12H  . metoprolol tartrate  25 mg Oral BID   Continuous Infusions: . azithromycin Stopped (01/14/19 0138)  . cefTRIAXone (ROCEPHIN)  IV Stopped (01/13/19 2335)  . famotidine (PEPCID) IV 20 mg (01/14/19 1022)  . lactated ringers    . lactated ringers Stopped (01/14/19 0843)   PRN Meds:.acetaminophen **OR** acetaminophen, guaiFENesin-dextromethorphan, morphine injection Allergies  Allergen Reactions  . Sudafed [Pseudoephedrine Hcl]  Causes dizziness and nervousness  . Atorvastatin Other (See Comments)    Reaction: Patient's family reports "confusion" and would prefer crestor    Vital Signs: BP (!) 147/80   Pulse 85   Temp (!) 97.5 F (36.4 C) (Oral)   Resp (!) 22   Ht 5\' 10"  (1.778 m)   Wt 68 kg   SpO2 100%   BMI 21.51 kg/m  Pain Scale: 0-10   Pain Score: 0-No pain   SpO2: SpO2: 100 % O2 Device:SpO2: 100 % O2 Flow Rate: .O2 Flow Rate (L/min): 6 L/min  IO: Intake/output summary:   Intake/Output Summary (Last 24 hours) at 01/14/2019 1749 Last data filed at 01/14/2019 1500 Gross per 24 hour  Intake 1416.28 ml  Output 425 ml  Net 991.28 ml    LBM: Last BM Date: (PTA) Baseline Weight: Weight: 68 kg Most recent weight: Weight: 68 kg     Palliative Assessment/Data: PPS 40%    The above conversation was completed via telephone due to the visitor restrictions during the COVID-19 pandemic. Thorough chart review and discussion with necessary members of the care team was completed as part of assessment. All issues were discussed and addressed but no physical exam was performed.  Time Total: 50 minutes Greater than 50%  of this time was spent counseling and coordinating care related to the above assessment and plan.  Juel Burrow, DNP, AGNP-C Palliative Medicine  Team [phone] Pager: [phone]

## 2019-01-15 DIAGNOSIS — J84112 Idiopathic pulmonary fibrosis: Secondary | ICD-10-CM | POA: Diagnosis not present

## 2019-01-15 DIAGNOSIS — I714 Abdominal aortic aneurysm, without rupture: Secondary | ICD-10-CM | POA: Diagnosis not present

## 2019-01-15 DIAGNOSIS — Z515 Encounter for palliative care: Secondary | ICD-10-CM | POA: Diagnosis not present

## 2019-01-15 DIAGNOSIS — J189 Pneumonia, unspecified organism: Secondary | ICD-10-CM | POA: Diagnosis not present

## 2019-01-15 DIAGNOSIS — Z7189 Other specified counseling: Secondary | ICD-10-CM | POA: Diagnosis not present

## 2019-01-15 LAB — GLUCOSE, CAPILLARY
Glucose-Capillary: 128 mg/dL — ABNORMAL HIGH (ref 70–99)
Glucose-Capillary: 144 mg/dL — ABNORMAL HIGH (ref 70–99)
Glucose-Capillary: 175 mg/dL — ABNORMAL HIGH (ref 70–99)
Glucose-Capillary: 191 mg/dL — ABNORMAL HIGH (ref 70–99)
Glucose-Capillary: 200 mg/dL — ABNORMAL HIGH (ref 70–99)

## 2019-01-15 LAB — BASIC METABOLIC PANEL
Anion gap: 8 (ref 5–15)
BUN: 20 mg/dL (ref 8–23)
CO2: 23 mmol/L (ref 22–32)
Calcium: 7.7 mg/dL — ABNORMAL LOW (ref 8.9–10.3)
Chloride: 111 mmol/L (ref 98–111)
Creatinine, Ser: 0.9 mg/dL (ref 0.61–1.24)
GFR calc Af Amer: >60 mL/min (ref >60)
GFR calc non Af Amer: >60 mL/min (ref >60)
Glucose, Bld: 150 mg/dL — ABNORMAL HIGH (ref 70–99)
Potassium: 4 mmol/L (ref 3.5–5.1)
Sodium: 142 mmol/L (ref 135–145)

## 2019-01-15 LAB — CBC
HCT: 27.9 % — ABNORMAL LOW (ref 39.0–52.0)
Hemoglobin: 8.8 g/dL — ABNORMAL LOW (ref 13.0–17.0)
MCH: 29 pg (ref 26.0–34.0)
MCHC: 31.5 g/dL (ref 30.0–36.0)
MCV: 92.1 fL (ref 80.0–100.0)
Platelets: 185 10*3/uL (ref 150–400)
RBC: 3.03 MIL/uL — ABNORMAL LOW (ref 4.22–5.81)
RDW: 16.4 % — ABNORMAL HIGH (ref 11.5–15.5)
WBC: 8.6 10*3/uL (ref 4.0–10.5)
nRBC: 0 % (ref 0.0–0.2)

## 2019-01-15 LAB — PROCALCITONIN: Procalcitonin: 0.5 ng/mL

## 2019-01-15 MED ORDER — SODIUM CHLORIDE 0.9 % IV BOLUS
1000.0000 mL | Freq: Once | INTRAVENOUS | Status: AC
  Administered 2019-01-15: 1000 mL via INTRAVENOUS

## 2019-01-15 NOTE — Progress Notes (Signed)
Patient was seen at bedside this morning.  On Precedex, management per PCCM.  Discussed with Dr. Ander Slade.  Please see his progress note.  TRH will see in the morning.

## 2019-01-15 NOTE — Progress Notes (Signed)
  Progress Note    01/15/2019 1:56 PM  Subjective: Patient without overnight issues  Vitals:   01/15/19 1200 01/15/19 1300  BP: (!) 147/74   Pulse: (!) 56 (!) 46  Resp: 13 12  Temp:    SpO2: 100% 100%    Physical Exam: Resting comfortably Abdomen is soft there is no palpable pulsation Bilateral common femoral arteries are palpable  CBC    Component Value Date/Time   WBC 8.6 01/15/2019 0631   RBC 3.03 (L) 01/15/2019 0631   HGB 8.8 (L) 01/15/2019 0631   HCT 27.9 (L) 01/15/2019 0631   PLT 185 01/15/2019 0631   MCV 92.1 01/15/2019 0631   MCH 29.0 01/15/2019 0631   MCHC 31.5 01/15/2019 0631   RDW 16.4 (H) 01/15/2019 0631   LYMPHSABS 1.0 01/12/2019 1801   MONOABS 0.4 01/12/2019 1801   EOSABS 0.0 01/12/2019 1801   BASOSABS 0.0 01/12/2019 1801    BMET    Component Value Date/Time   NA 142 01/15/2019 0631   K 4.0 01/15/2019 0631   CL 111 01/15/2019 0631   CO2 23 01/15/2019 0631   GLUCOSE 150 (H) 01/15/2019 0631   BUN 20 01/15/2019 0631   CREATININE 0.90 01/15/2019 0631   CALCIUM 7.7 (L) 01/15/2019 0631   GFRNONAA >60 01/15/2019 0631   GFRAA >60 01/15/2019 0631    INR    Component Value Date/Time   INR 1.26 06/17/2016 1015     Intake/Output Summary (Last 24 hours) at 01/15/2019 1356 Last data filed at 01/15/2019 1330 Gross per 24 hour  Intake 4173.03 ml  Output 750 ml  Net 3423.03 ml     Assessment/plan:  85 y.o. male is here with persistent lower back pain with fracture and also has UTI and multilobar pneumonia in the setting of pulmonary fibrosis.  Abdominal aortic aneurysm with type II endoleak has enlarged there is no enlarged palpable aorta in his abdomen and doubtful of is causing any of his symptoms at this time.  Should be okay for any activity from a vascular standpoint.  As this is not an acute issue he needs to recover from his current issues and we can have him follow-up in the office to discuss endovascular repair likely with interventional  radiology.  I discussed this plan with the wife who does plan to discuss patient's ongoing care with palliative medicine as well.   Kelty Szafran C. Donzetta Matters, MD [facility] Office: [phone] Pager: [phone]  01/15/2019 1:56 PM

## 2019-01-15 NOTE — Progress Notes (Signed)
Daily Progress Note   Patient Name: [name] [name]       Date: 01/15/2019 DOB: January 27, 1930  Age: 85 y.o. MRN#: [id number] Attending Physician: Kayleen Memos, DO Primary Care Physician: Kelton Pillar, MD Admit Date: 01/12/2019  Reason for Consultation/Follow-up: Establishing goals of care  Subjective: Spoke with RN - continues to struggle with agitation - precedex overnight, off now - lethargic.  Length of Stay: 3  Current Medications: Scheduled Meds:  . chlorhexidine  15 mL Mouth Rinse BID  . Chlorhexidine Gluconate Cloth  6 each Topical Daily  . feeding supplement (ENSURE ENLIVE)  237 mL Oral BID BM  . mouth rinse  15 mL Mouth Rinse q12n4p  . methylPREDNISolone (SOLU-MEDROL) injection  40 mg Intravenous Q12H  . metoprolol tartrate  25 mg Oral BID    Continuous Infusions: . azithromycin Stopped (01/15/19 0225)  . cefTRIAXone (ROCEPHIN)  IV Stopped (01/14/19 2356)  . dexmedetomidine (PRECEDEX) IV infusion 0.3 mcg/kg/hr (01/15/19 0400)  . famotidine (PEPCID) IV Stopped (01/14/19 2150)  . lactated ringers 100 mL/hr at 01/15/19 0400    PRN Meds: acetaminophen **OR** acetaminophen, guaiFENesin-dextromethorphan, morphine injection  Vital Signs: BP 111/74   Pulse (!) 58   Temp (!) 97.2 F (36.2 C) (Axillary)   Resp 19   Ht 5\' 10"  (1.778 m)   Wt 68 kg   SpO2 94%   BMI 21.51 kg/m  SpO2: SpO2: 94 % O2 Device: O2 Device: Nasal Cannula O2 Flow Rate: O2 Flow Rate (L/min): 4 L/min  Intake/output summary:   Intake/Output Summary (Last 24 hours) at 01/15/2019 0813 Last data filed at 01/15/2019 [id number] Gross per 24 hour  Intake 2235.16 ml  Output 325 ml  Net 1910.16 ml   LBM: Last BM Date: (PTA) Baseline Weight: Weight: 68 kg Most recent weight: Weight: 68 kg       Palliative  Assessment/Data: PPS 40%   Flowsheet Rows     Most Recent Value  Intake Tab  Referral Department  Critical care  Unit at Time of Referral  ICU  Palliative Care Primary Diagnosis  Pulmonary  Date Notified  01/13/19  Palliative Care Type  New Palliative care  Reason for referral  Clarify Goals of Care  Date of Admission  01/12/19  Date first seen by Palliative Care  01/14/19  # of days Palliative referral response time  1 Day(s)  # of days IP prior to Palliative referral  1  Clinical Assessment  Palliative Performance Scale Score  40%  Psychosocial & Spiritual Assessment  Palliative Care Outcomes  Patient/Family meeting held?  Yes  Who was at the meeting?  wife  [place] regarding hospice, Provided psychosocial or spiritual support      Patient Active Problem List   Diagnosis Date Noted  . Goals of care, counseling/discussion   . Palliative care by specialist   . Vertebral compression fracture ([place]) 01/13/2019  . Community acquired pneumonia 01/13/2019  . Pneumonia 01/13/2019  . Pressure injury of skin 01/13/2019  . Lower urinary tract infectious disease 10/20/2018  . TIA (transient ischemic attack)   . Pulmonary fibrosis ([place])   . Prostate cancer ([place])   . GERD (gastroesophageal reflux disease)   .  Diplopia   . Carotid artery stenosis   . Allergy   . AAA (abdominal aortic aneurysm) ([place])   . Cognitive decline   . Multiple subsegmental pulmonary emboli without acute cor pulmonale ([place]) 08/24/2018  . Lobar pneumonia, unspecified organism ([place]) 08/24/2018  . Hyperlipidemia 05/31/2017  . Chronic hypoxemic respiratory failure ([place]) 07/20/2016  . IPF (idiopathic pulmonary fibrosis) ([place]) 07/11/2015  . H/O endovascular stent graft for abdominal aortic aneurysm 07/11/2015  . H/O carotid endarterectomy 07/11/2015  . History of stroke 05/06/2015  . Swelling of ankle joint 04/23/2014  . Memory loss 05/29/2013  . Mild cognitive impairment 05/29/2013   . CAD (coronary artery disease) 05/25/2013  . AAA (abdominal aortic aneurysm) without rupture ([place]) 05/15/2013  . Occlusion and stenosis of carotid artery with cerebral infarction 04/19/2013  . Cerebral infarction ([place]) 04/18/2013  . Other and unspecified hyperlipidemia 04/18/2013  . Carotid stenosis 04/18/2013  . Stroke ([place]) 04/18/2013  . Spondylisthesis 2015  . Occlusion and stenosis of carotid artery without mention of cerebral infarction 10/12/2011    Palliative Care Assessment & Plan   HPI: 85 y.o. male  with past medical history of idiopathic pulmonary fibrosis on 5 L oxygen at home, PE on xarelto, AAA, and GERD admitted on 01/12/2019 with persistent lower back pain and cough. Diagnosed with sepsis in setting of pna/uti and increasing AAA. BP soft, tachycardic, and increasing oxygen needs warranted admission to ICU. Patient receiving IV antibiotics and increased oxygen support. No plans for surgical intervention for AAA. PMT consulted for [place].  Assessment: Spoke with wife this AM - reviewed clinical condition. She reviews what she had been told by pulmonology outpatient - she understands poor prognosis r/t pulmonary fibrosis and is now also quite concerned with AAA.   We reviewed options for care moving forward - rehab vs home with home health (palliative to follow) vs hospice care at home. She seems to have decided against rehab d/t COVID visitor restrictions in most facilities - also fears how continued disruption in environment will affect patient. She is trying to decide between home health vs hospice care - we discussed overall goals of care and what makes most sense for patient at this stage in his disease process. She requests time to think about these two options and also speak with her daughters. We plan to follow up later in the day or tomorrow.   Recommendations/Plan:  Continued goals of care discussions - wife not interested in rehab facility - considering home with  hospice vs home with home health and palliative  Code Status:  DNR  Prognosis:   < 6 months  Discharge Planning:  To Be Determined  Care plan was discussed with patient's wife, RN  Thank you for allowing the Palliative Medicine Team to assist in the care of this patient.   Total Time 35 minutes Prolonged Time Billed  no    The above conversation was completed via telephone due to the visitor restrictions during the COVID-19 pandemic. Thorough chart review and discussion with necessary members of the care team was completed as part of assessment. All issues were discussed and addressed but no physical exam was performed.    Greater than 50%  of this time was spent counseling and coordinating care related to the above assessment and plan.  Juel Burrow, DNP, [facility] Palliative Medicine Team Team Phone # [phone]  Pager [phone]

## 2019-01-15 NOTE — ED Provider Notes (Signed)
[facility] Provider Note   CSN: [id number] Arrival date & time: 01/12/19  1704     History   Chief Complaint Chief Complaint  Patient presents with   Back Pain   Rectal Bleeding    HPI [name] [name] is a 85 y.o. male.     HPI Patient presents to the emergency department with lower back pain that has been worsening over the last couple weeks.  The patient's had several falls.  Gives me the history as the patient is not able to do so.  Has had worsening symptoms over the last few weeks.  The wife states that he has had some rectal bleeding at home as well.  He states that he seems to be weaker than normal. Past Medical History:  Diagnosis Date   AAA (abdominal aortic aneurysm) (HCC)    Allergy    Carotid artery stenosis    Cognitive decline    Diplopia    GERD (gastroesophageal reflux disease)    Hyperlipidemia    Memory loss    Prostate cancer ([place])    Pulmonary fibrosis ([place])    Spondylisthesis 2015   Spine   Stroke ([place]) 04/18/13   some weakness left-returning strength   TIA (transient ischemic attack)     Patient Active Problem List   Diagnosis Date Noted   Goals of care, counseling/discussion    Palliative care by specialist    Vertebral compression fracture ([place]) 01/13/2019   Community acquired pneumonia 01/13/2019   Pneumonia 01/13/2019   Pressure injury of skin 01/13/2019   Lower urinary tract infectious disease 10/20/2018   TIA (transient ischemic attack)    Pulmonary fibrosis (HCC)    Prostate cancer (HCC)    GERD (gastroesophageal reflux disease)    Diplopia    Carotid artery stenosis    Allergy    AAA (abdominal aortic aneurysm) (HCC)    Cognitive decline    Multiple subsegmental pulmonary emboli without acute cor pulmonale ([place]) 08/24/2018   Lobar pneumonia, unspecified organism ([place]) 08/24/2018   Hyperlipidemia 05/31/2017   Chronic hypoxemic respiratory failure ([place])  07/20/2016   IPF (idiopathic pulmonary fibrosis) ([place]) 07/11/2015   H/O endovascular stent graft for abdominal aortic aneurysm 07/11/2015   H/O carotid endarterectomy 07/11/2015   History of stroke 05/06/2015   Swelling of ankle joint 04/23/2014   Memory loss 05/29/2013   Mild cognitive impairment 05/29/2013   CAD (coronary artery disease) 05/25/2013   AAA (abdominal aortic aneurysm) without rupture ([place]) 05/15/2013   Occlusion and stenosis of carotid artery with cerebral infarction 04/19/2013   Cerebral infarction ([place]) 04/18/2013   Other and unspecified hyperlipidemia 04/18/2013   Carotid stenosis 04/18/2013   Stroke ([place]) 04/18/2013   Spondylisthesis 2015   Occlusion and stenosis of carotid artery without mention of cerebral infarction 10/12/2011    Past Surgical History:  Procedure Laterality Date   ABDOMINAL AORTIC ENDOVASCULAR STENT GRAFT N/A 06/17/2016   Procedure: ABDOMINAL AORTIC ENDOVASCULAR STENT GRAFT;  Surgeon: Waynetta Sandy, MD;  Location: [facility];  Service: Vascular;  Laterality: N/A;   CAROTID ENDARTERECTOMY  Sept. 2008   Elective left carotid endarterectomy   CAROTID ENDARTERECTOMY Right 04/27/2013   CE   ENDARTERECTOMY Right 04/27/2013   Procedure: ENDARTERECTOMY CAROTID;  Surgeon: Mal Misty, MD;  [facility];  Service: Vascular;  Laterality: Right;   EYE SURGERY     Bilateral cataract    FOOT SURGERY      left Achilles Tendon Repair  seed implantation  2001   for prostate ca        Home Medications    Prior to Admission medications   Medication Sig Start Date End Date Taking? Authorizing Provider  acetaminophen (TYLENOL) 500 MG tablet Take 1,000 mg by mouth every 6 (six) hours as needed for mild pain or fever.    Yes [provider]  azelastine (ASTELIN) 0.1 % nasal spray Place 2 sprays into both nostrils 2 (two) times daily.  01/08/15  Yes [provider]  dextromethorphan-guaiFENesin (MUCINEX  DM) 30-600 MG 12hr tablet Take 1 tablet by mouth 2 (two) times daily.    Yes [provider]  L-Methylfolate-B12-B6-B2 (CEREFOLIN) 08-20-48-5 MG TABS TAKE 1 TABLET BY MOUTH EVERY DAY 03/30/18  Yes Dennie Bible, NP  loratadine (CLARITIN) 10 MG tablet Take 10 mg by mouth daily.   Yes [provider]  Multiple Vitamins-Minerals (CENTRUM SILVER PO) Take 1 tablet by mouth daily.    Yes [provider]  nitrofurantoin, macrocrystal-monohydrate, (MACROBID) 100 MG capsule Take 100 mg by mouth 2 (two) times daily. 01/11/19  Yes [provider]  Omega-3 Fatty Acids (FISH OIL PO) Take 1 capsule by mouth daily.   Yes [provider]  pantoprazole (PROTONIX) 40 MG tablet TAKE 1 TABLET DAILY,30 MINUTES BEFORE DINNER. 12/15/18  Yes McQuaid, Ronie Spies, MD  predniSONE (DELTASONE) 10 MG tablet TAKE 1 TABLET EVERY DAY WITH BREAKFAST. 10/23/18  Yes Juanito Doom, MD  Resveratrol 100 MG CAPS Take 200 mg by mouth every evening.    Yes [provider]  rivaroxaban (XARELTO) 10 MG TABS tablet Take 1 tablet (10 mg total) by mouth daily. 01/04/19  Yes Mannam, Praveen, MD  rosuvastatin (CRESTOR) 5 MG tablet Take 5 mg by mouth at bedtime.   Yes [provider]  cephALEXin (KEFLEX) 500 MG capsule Take 1 capsule (500 mg total) by mouth every 12 (twelve) hours. Patient not taking: Reported on 01/13/2019 10/21/18   Geradine Girt, DO    Family History Family History  Problem Relation Age of Onset   Stroke Mother    Heart disease Mother        After age 49   Varicose Veins Mother    Hypertension Mother    Cancer Mother        Theadora Rama    Stroke Father    Heart attack Father    Deep vein thrombosis Father    Anuerysm Father    Heart disease Brother     Social History Social History   Tobacco Use   Smoking status: Former Smoker    Packs/day: 1.00    Years: 14.00    Pack years: 14.00    Types: Cigarettes    Quit date: 03/27/1965     Years since quitting: 53.8   Smokeless tobacco: Never Used  Substance Use Topics   Alcohol use: Yes    Alcohol/week: 1.0 standard drinks    Types: 1 Shots of liquor per week    Comment: 3-4 drinks week bourbon   Drug use: No     Allergies   Sudafed [pseudoephedrine hcl] and Atorvastatin   Review of Systems Review of Systems  Level 5 caveat applies due to dementia Physical Exam Updated Vital Signs BP 102/65    Pulse (!) 57    Temp (!) 97.5 F (36.4 C) (Axillary)    Resp (!) 22    Ht 5\' 10"  (1.778 m)    Wt 68 kg  SpO2 100%    BMI 21.51 kg/m   Physical Exam Vitals signs and nursing note reviewed.  Constitutional:      General: He is not in acute distress.    Appearance: He is well-developed.  HENT:     Head: Normocephalic and atraumatic.  Eyes:     Pupils: Pupils are equal, round, and reactive to light.  Neck:     Musculoskeletal: Normal range of motion and neck supple.  Cardiovascular:     Rate and Rhythm: Normal rate and regular rhythm.     Heart sounds: Normal heart sounds. No murmur. No friction rub. No gallop.   Pulmonary:     Effort: Pulmonary effort is normal. No respiratory distress.     Breath sounds: Normal breath sounds. No wheezing.  Abdominal:     General: Bowel sounds are normal. There is no distension.     Palpations: Abdomen is soft.     Tenderness: There is no abdominal tenderness.  Skin:    General: Skin is warm and dry.     Capillary Refill: Capillary refill takes less than 2 seconds.     Findings: No erythema or rash.  Neurological:     Mental Status: He is alert and oriented to person, place, and time.     Motor: No abnormal muscle tone.     Coordination: Coordination normal.  Psychiatric:        Behavior: Behavior normal.      ED Treatments / Results  Labs (all labs ordered are listed, but only abnormal results are displayed) Labs Reviewed  COMPREHENSIVE METABOLIC PANEL - Abnormal; Notable for the following components:       Result Value   Glucose, Bld 123 (*)    Calcium 8.6 (*)    Albumin 1.9 (*)    AST 71 (*)    ALT 93 (*)    Alkaline Phosphatase 174 (*)    All other components within normal limits  CBC WITH DIFFERENTIAL/PLATELET - Abnormal; Notable for the following components:   WBC 15.8 (*)    RBC 3.29 (*)    Hemoglobin 9.6 (*)    HCT 30.6 (*)    RDW 16.2 (*)    Neutro Abs 14.3 (*)    Abs Immature Granulocytes 0.11 (*)    All other components within normal limits  URINALYSIS, ROUTINE W REFLEX MICROSCOPIC - Abnormal; Notable for the following components:   Color, Urine AMBER (*)    APPearance HAZY (*)    Specific Gravity, Urine 1.036 (*)    Protein, ur 30 (*)    Nitrite POSITIVE (*)    Leukocytes,Ua LARGE (*)    Bacteria, UA RARE (*)    All other components within normal limits  CBC - Abnormal; Notable for the following components:   WBC 14.1 (*)    RBC 3.39 (*)    Hemoglobin 9.9 (*)    HCT 31.8 (*)    RDW 16.1 (*)    All other components within normal limits  BRAIN NATRIURETIC PEPTIDE - Abnormal; Notable for the following components:   B Natriuretic Peptide 214.5 (*)    All other components within normal limits  GLUCOSE, CAPILLARY - Abnormal; Notable for the following components:   Glucose-Capillary 66 (*)    All other components within normal limits  GLUCOSE, CAPILLARY - Abnormal; Notable for the following components:   Glucose-Capillary 155 (*)    All other components within normal limits  GLUCOSE, CAPILLARY - Abnormal; Notable for the following components:  Glucose-Capillary 137 (*)    All other components within normal limits  GLUCOSE, CAPILLARY - Abnormal; Notable for the following components:   Glucose-Capillary 123 (*)    All other components within normal limits  GLUCOSE, CAPILLARY - Abnormal; Notable for the following components:   Glucose-Capillary 117 (*)    All other components within normal limits  GLUCOSE, CAPILLARY - Abnormal; Notable for the following components:     Glucose-Capillary 122 (*)    All other components within normal limits  GLUCOSE, CAPILLARY - Abnormal; Notable for the following components:   Glucose-Capillary 118 (*)    All other components within normal limits  GLUCOSE, CAPILLARY - Abnormal; Notable for the following components:   Glucose-Capillary 211 (*)    All other components within normal limits  GLUCOSE, CAPILLARY - Abnormal; Notable for the following components:   Glucose-Capillary 162 (*)    All other components within normal limits  NOVEL CORONAVIRUS, NAA (HOSP ORDER, SEND-OUT TO REF LAB; TAT 18-24 HRS)  RESPIRATORY PANEL BY PCR  SARS CORONAVIRUS 2 BY RT PCR (HOSPITAL ORDER, [facility])  MRSA PCR SCREENING  URINE CULTURE  LACTIC ACID, PLASMA  PROCALCITONIN  PROCALCITONIN  LEGIONELLA PNEUMOPHILA SEROGP 1 UR AG  PROCALCITONIN  POC OCCULT BLOOD, ED    EKG EKG Interpretation  Date/Time:  Saturday January 13 2019 06:43:07 EDT Ventricular Rate:  124 PR Interval:    QRS Duration: 94 QT Interval:  308 QTC Calculation: 443 R Axis:   -14 Text Interpretation:  Sinus tachycardia Atrial premature complexes ST depression, probably rate related with rate increase from prior 10/20 Confirmed by Aletta Edouard [phone]) on 01/14/2019 12:39:02 PM   Radiology Ct Chest Wo Contrast  Result Date: 01/13/2019 CLINICAL DATA:  85 year old male with shortness of breath. EXAM: CT CHEST WITHOUT CONTRAST TECHNIQUE: Multidetector CT imaging of the chest was performed following the standard protocol without IV contrast. COMPARISON:  Chest CT dated 08/17/2018 and radiograph dated 10/20/2018. FINDINGS: Evaluation of this exam is limited in the absence of intravenous contrast. Cardiovascular: There is no cardiomegaly or pericardial effusion. Multi vessel coronary vascular calcifications noted. There is moderate atherosclerotic calcification of the thoracic aorta. The aorta is mildly tortuous. Mild dilatation of the main  pulmonary trunk suggestive of a degree of pulmonary hypertension. Clinical correlation is recommended. Mediastinum/Nodes: No hilar or mediastinal adenopathy. The esophagus is grossly unremarkable. No mediastinal fluid collection. Lungs/Pleura: There is background of interstitial lung disease and fibrosis. Progression of consolidative change primarily involving the lingula since the prior CT of 08/17/2018. Although this may be related to progression of fibrosis, superimposed pneumonia is not excluded. Clinical correlation is recommended. There is no pleural effusion or pneumothorax. The central airways remain patent. Upper Abdomen: No acute abnormality. Musculoskeletal: No chest wall mass or suspicious bone lesions identified. IMPRESSION: Advanced interstitial lung disease and fibrosis. Increased consolidative change in the lingula since the prior CT of 08/17/2018 may be related to progression of fibrosis, although superimposed pneumonia is not excluded. Clinical correlation is recommended. Aortic Atherosclerosis (ICD10-I70.0). Electronically Signed   By: Anner Crete M.D.   On: 01/13/2019 01:35   Dg Chest Portable 1 View  Result Date: 01/13/2019 CLINICAL DATA:  Tachycardia EXAM: PORTABLE CHEST 1 VIEW COMPARISON:  October 20, 2018 FINDINGS: There is extensive airspace opacity in the left mid lower lung zones. There are underlying areas of fibrosis throughout the lungs which is stable. There is mild cardiomegaly with pulmonary vascularity normal. No adenopathy. Bones are osteoporotic. IMPRESSION: Airspace  opacity consistent with pneumonia in the left mid and lower lung zones, superimposed on chronic fibrotic change. Heart is mildly enlarged, stable. Pulmonary vascularity normal. Bones osteoporotic. Electronically Signed   By: Lowella Grip III M.D.   On: 01/13/2019 07:53   Dg Abd Portable 1v  Result Date: 01/13/2019 CLINICAL DATA:  History of abdominal aortic aneurysm. EXAM: PORTABLE ABDOMEN - 1 VIEW  COMPARISON:  01/12/19 FINDINGS: The bowel gas pattern appears normal. Metallic scaffolding for infrarenal abdominal aortic stent graft is again noted. Contrast material from recent enhanced CT of the abdomen is identified within the bladder. IMPRESSION: 1. Normal bowel gas pattern. 2. Stable appearance of infrarenal abdominal aortic stent graft. Electronically Signed   By: Kerby Moors M.D.   On: 01/13/2019 09:25    Procedures Procedures (including critical care time)  Medications Ordered in ED Medications  morphine 2 MG/ML injection 1 mg (1 mg Intravenous Given 01/13/19 0534)  acetaminophen (TYLENOL) tablet 650 mg ( Oral See Alternative 01/14/19 0050)    Or  acetaminophen (TYLENOL) suppository 650 mg (650 mg Rectal Given 01/14/19 0050)  azithromycin (ZITHROMAX) 500 mg in sodium chloride 0.9 % 250 mL IVPB ( Intravenous Stopped 01/14/19 0138)  cefTRIAXone (ROCEPHIN) 2 g in sodium chloride 0.9 % 100 mL IVPB (2 g Intravenous New Bag/Given 01/14/19 2323)  methylPREDNISolone sodium succinate (SOLU-MEDROL) 40 mg/mL injection 40 mg (40 mg Intravenous Given 01/15/19 0016)  lactated ringers infusion ( Intravenous Rate/Dose Verify 01/14/19 2300)  famotidine (PEPCID) IVPB 20 mg premix ( Intravenous Stopped 01/14/19 2150)  Chlorhexidine Gluconate Cloth 2 % PADS 6 each (6 each Topical Given 01/14/19 1502)  chlorhexidine (PERIDEX) 0.12 % solution 15 mL (15 mLs Mouth Rinse Not Given 01/14/19 1045)  MEDLINE mouth rinse (15 mLs Mouth Rinse Not Given 01/14/19 1749)  guaiFENesin-dextromethorphan (ROBITUSSIN DM) 100-10 MG/5ML syrup 10 mL (has no administration in time range)  metoprolol tartrate (LOPRESSOR) tablet 25 mg (25 mg Oral Not Given 01/14/19 2219)  feeding supplement (ENSURE ENLIVE) (ENSURE ENLIVE) liquid 237 mL (has no administration in time range)  dexmedetomidine (PRECEDEX) 400 MCG/100ML (4 mcg/mL) infusion (0.4 mcg/kg/hr  68 kg Intravenous Rate/Dose Verify 01/14/19 2300)  morphine 4 MG/ML  injection 4 mg (4 mg Intravenous Given 01/12/19 1844)  iohexol (OMNIPAQUE) 300 MG/ML solution 100 mL (100 mLs Intravenous Contrast Given 01/12/19 1933)  sodium chloride 0.9 % bolus 500 mL (0 mLs Intravenous Stopped 01/12/19 2200)  levofloxacin (LEVAQUIN) IVPB 750 mg (0 mg Intravenous Stopped 01/13/19 0147)  metoprolol tartrate (LOPRESSOR) injection 5 mg (5 mg Intravenous Given 01/13/19 0700)  sodium chloride 0.9 % bolus 250 mL (250 mLs Intravenous New Bag/Given 01/13/19 0942)  dextrose 50 % solution (50 mLs  Given 01/13/19 1721)  haloperidol lactate (HALDOL) injection 0.5 mg (0.5 mg Intravenous Given 01/14/19 0329)  lactated ringers bolus 500 mL (0 mLs Intravenous Stopped 01/14/19 2015)  haloperidol lactate (HALDOL) injection 1 mg (1 mg Intravenous Given 01/14/19 1819)     Initial Impression / Assessment and Plan / ED Course  I have reviewed the triage vital signs and the nursing notes.  Pertinent labs & imaging results that were available during my care of the patient were reviewed by me and considered in my medical decision making (see chart for details).        Patient will be admitted to the hospital for further evaluation and care.  Patient has been stable here in the emergency department.  Final Clinical Impressions(s) / ED Diagnoses   Final diagnoses:  Community acquired pneumonia,  unspecified laterality  Compression fracture of L3 vertebra, initial encounter [facility])  Lower urinary tract infectious disease    ED Discharge Orders    None       Dalia Heading, Hershal Coria 01/15/19 Marrion Coy, MD 01/15/19 1043

## 2019-01-15 NOTE — Progress Notes (Signed)
NAME:  [name] [name], MRN:  [id number], DOB:  05-02-1929, LOS: 3 ADMISSION DATE:  01/12/2019, CONSULTATION DATE: 10/24 REFERRING MD: ED doc, CHIEF COMPLAINT: Pneumonia, urinary tract infection, respiratory failure  Brief History   85 yo male with PMH significant for memory loss, chronic respiratory failure in setting of idiopathic pulmonary fibrosis on 5 L [place] at home, recent PE on Xarelto, AAA, GERD, being admitted with sepsis in setting of PNA/UTI and increasing aneurysm. Vascular evaluated, no emergent indication for OR.   History of present illness   Patient seen in clinic 10/1 for 4 month anticoagulation follow up in setting of PE.  Follow up virtual visit on 10/15. CT Chest at that time with progression of IPF.  Family had elected previously to not pursue anti-fibrotics.  Due to recurrent falls, his Xarelto dose was lowered to 10 mg daily.  COVID testing completed on 10/13 due to increased cough.    Patient presented to ED yesterday for persistent lower back pain and worsening cough.  Initial work up revealed increasing size of infrarenal abdominal aortic aneurysm and patch consolidation of the left lower lobe concerning for PNA.  UA suspicious for UTI as well.  Also found to have compression deformities of the L2, L3 vertebral bodies.  He received Levaquin in the ED for antimicrobial coverage.  He remained in the ED awaiting inpatient bed.  He was noted to have marginal BP, tachycardia and increasing O2 requirements. Therefore, PCCM consulted to admit to ICU. Wife at bedside, states he recently completed a course of Levaquin.  Patient is lethargic but arousable and will follow commands and attempt to participate in exam, however confused. No focal deficits on exam.   Past Medical History  IPF (prednisone), family decided to not pursue anti-fibrotics Chronic hypoxemia: 5 L [place] Subsegmental PE (on Xarelto) GERD Stroke AAA CAD Memory Loss/Coginitive Decline   Significant Hospital  Events   Admitting 10/24  Consults:  PCCM Vascular  Procedures:  Not applicable  Significant Diagnostic Tests:  10/24 CT scan of the chest Advanced interstitial lung disease and fibrosis. Increased consolidative change in the lingula since the prior CT of 08/17/2018 may be related to progression of fibrosis, although superimposed pneumonia is not excluded. Clinical correlation is recommended.  Aortic Atherosclerosis (ICD10-I70.0). Micro Data:  Urine Cx 10/24 >>Advanced ILD/F.  Increased consolidation in the lingula.    Antimicrobials:  Rocephin 10/24 >> Azithro 10/24 >>    Interim history/subjective:  Resting comfortably in bed on Precedex Having issues with delirium Oligoria  Objective   Blood pressure 111/74, pulse (!) 58, temperature (!) 97.2 F (36.2 C), temperature source Axillary, resp. rate 19, height 5\' 10"  (1.778 m), weight 68 kg, SpO2 94 %.        Intake/Output Summary (Last 24 hours) at 01/15/2019 0817 Last data filed at 01/15/2019 [id number] Gross per 24 hour  Intake 2235.16 ml  Output 325 ml  Net 1910.16 ml   Filed Weights   01/12/19 1713  Weight: 68 kg    Examination: General: Resting comfortably HENT: Dry oral mucosa Lungs: Bibasal rales Cardiovascular: S1-S2 appreciated, no murmur Abdomen: Soft, bowel sounds appreciated Extremities: Has no clubbing or edema Neuro: Resting comfortably GU:   Resolved Hospital Problem list     Assessment & Plan:   Acute on chronic respiratory failure Idiopathic pulmonary fibrosis-advanced disease Pneumonia -On antibiotics today will be day 3 of azithromycin and Rocephin -Continue oxygen supplementation -Continue steroids  Multilobar pneumonia -Empiric Rocephin/azithromycin  Hypoxemic respiratory failure -Oxygen supplementation  to keep saturations greater than 99%  Oliguria -Trend labs -Continue fluid resuscitation -Patient has not been taking any much orally -On lactated Ringer's 100 cc an hour   Bradycardia -Likely related to sedation -We will lighten sedation and monitor closely  Risk to himself with his delirium  Delirium -On Precedex -Bradycardia on Precedex, will stop Precedex and monitor closely  Abdominal aortic aneurysm -Previous aortobiiliac endovascular stent graft by Dr. Donzetta Matters, 06/17/2016 -Has a known endoleak -Increase in size of endoleak from previous -On beta-blockers -Blood pressure control  Risk of decompensation from his comorbidities is high -Patient is DNR -Appreciate palliative care involvement -For ongoing goals of care discussion today with palliative care   Best practice:  Diet: Ensure twice daily , diet as tolerated  pain/Anxiety/Delirium protocol (if indicated): Was on Precedex VAP protocol (if indicated): Not applicable DVT prophylaxis: SCD GI prophylaxis: Protonix Glucose control: Not applicable Mobility: Bedrest Code Status: DNR Family Communication: Will update Disposition: Transfer to medical floor  Labs   CBC: Recent Labs  Lab 01/12/19 1801 01/13/19 0237  WBC 15.8* 14.1*  NEUTROABS 14.3*  --   HGB 9.6* 9.9*  HCT 30.6* 31.8*  MCV 93.0 93.8  PLT 243 0000000    Basic Metabolic Panel: Recent Labs  Lab 01/12/19 1801  NA 137  K 4.6  CL 104  CO2 25  GLUCOSE 123*  BUN 18  CREATININE 0.90  CALCIUM 8.6*   GFR: Estimated Creatinine Clearance: 55.6 mL/min (by C-G formula based on SCr of 0.9 mg/dL). Recent Labs  Lab 01/12/19 1801 01/13/19 0220 01/13/19 0237 01/13/19 1829 01/14/19 0414 01/15/19 0413  PROCALCITON  --   --   --  0.98 0.89 0.50  WBC 15.8*  --  14.1*  --   --   --   LATICACIDVEN  --  1.0  --   --   --   --     Liver Function Tests: Recent Labs  Lab 01/12/19 1801  AST 71*  ALT 93*  ALKPHOS 174*  BILITOT 0.9  PROT 6.8  ALBUMIN 1.9*   No results for input(s): LIPASE, AMYLASE in the last 168 hours. No results for input(s): AMMONIA in the last 168 hours.  ABG    Component Value Date/Time    PHART 7.440 06/11/2016 1610   PCO2ART 37.8 06/11/2016 1610   PO2ART 62.9 (L) 06/11/2016 1610   HCO3 25.3 06/11/2016 1610   O2SAT 91.8 06/11/2016 [place]

## 2019-01-15 NOTE — Progress Notes (Signed)
[facility] Progress Note Patient Name: [name] [name] DOB: May 29, 1929 MRN: [id number]   Date of Service  01/15/2019  HPI/Events of Note  Oliguria - No CVL or CVP. LVEF = 50-55%. Unable to contact nurse to see if patient has residual in bladder (external urinary catheter in place).   eICU Interventions  Will order: 1. Bolus with 0.9 NaCl 1 liter IV over 1 hour now.      Intervention Category Major Interventions: Other:  [name],[name] [name] [name] 01/15/2019, 3:57 AM

## 2019-01-15 NOTE — Consult Note (Signed)
[place] Nurse wound consult note Patient receiving care in [facility] 3M02. Reason for Consult: "DTI on sacrum" Wound type: no wound to sacrum or coccyx found at the time of my assessment. Dressing procedure/placement/frequency: Sacral foam dressing is in use.  Continue these.  Turn and reposition patient.  Use skin care products from [employer]. Thank you for the consult.  Discussed plan of care with the patient and bedside nurse.  [place] nurse will not follow at this time.  Please re-consult the [facility] team if needed.  Val Riles, RN, MSN, CWOCN, CNS-BC, pager [phone]

## 2019-01-16 DIAGNOSIS — J84112 Idiopathic pulmonary fibrosis: Secondary | ICD-10-CM | POA: Diagnosis not present

## 2019-01-16 DIAGNOSIS — Z515 Encounter for palliative care: Secondary | ICD-10-CM | POA: Diagnosis not present

## 2019-01-16 DIAGNOSIS — I714 Abdominal aortic aneurysm, without rupture: Secondary | ICD-10-CM | POA: Diagnosis not present

## 2019-01-16 DIAGNOSIS — J189 Pneumonia, unspecified organism: Secondary | ICD-10-CM | POA: Diagnosis not present

## 2019-01-16 DIAGNOSIS — Z7189 Other specified counseling: Secondary | ICD-10-CM | POA: Diagnosis not present

## 2019-01-16 LAB — CBC
HCT: 28.2 % — ABNORMAL LOW (ref 39.0–52.0)
Hemoglobin: 9.1 g/dL — ABNORMAL LOW (ref 13.0–17.0)
MCH: 28.9 pg (ref 26.0–34.0)
MCHC: 32.3 g/dL (ref 30.0–36.0)
MCV: 89.5 fL (ref 80.0–100.0)
Platelets: 221 10*3/uL (ref 150–400)
RBC: 3.15 MIL/uL — ABNORMAL LOW (ref 4.22–5.81)
RDW: 16.2 % — ABNORMAL HIGH (ref 11.5–15.5)
WBC: 13.7 10*3/uL — ABNORMAL HIGH (ref 4.0–10.5)
nRBC: 0 % (ref 0.0–0.2)

## 2019-01-16 LAB — BASIC METABOLIC PANEL
Anion gap: 10 (ref 5–15)
BUN: 18 mg/dL (ref 8–23)
CO2: 24 mmol/L (ref 22–32)
Calcium: 8 mg/dL — ABNORMAL LOW (ref 8.9–10.3)
Chloride: 107 mmol/L (ref 98–111)
Creatinine, Ser: 0.9 mg/dL (ref 0.61–1.24)
GFR calc Af Amer: >60 mL/min (ref >60)
GFR calc non Af Amer: >60 mL/min (ref >60)
Glucose, Bld: 152 mg/dL — ABNORMAL HIGH (ref 70–99)
Potassium: 3.5 mmol/L (ref 3.5–5.1)
Sodium: 141 mmol/L (ref 135–145)

## 2019-01-16 LAB — GLUCOSE, CAPILLARY
Glucose-Capillary: 146 mg/dL — ABNORMAL HIGH (ref 70–99)
Glucose-Capillary: 147 mg/dL — ABNORMAL HIGH (ref 70–99)
Glucose-Capillary: 148 mg/dL — ABNORMAL HIGH (ref 70–99)
Glucose-Capillary: 165 mg/dL — ABNORMAL HIGH (ref 70–99)
Glucose-Capillary: 171 mg/dL — ABNORMAL HIGH (ref 70–99)
Glucose-Capillary: 184 mg/dL — ABNORMAL HIGH (ref 70–99)

## 2019-01-16 MED ORDER — AMLODIPINE BESYLATE 5 MG PO TABS
5.0000 mg | ORAL_TABLET | Freq: Every day | ORAL | Status: DC
  Administered 2019-01-16: 5 mg via ORAL
  Filled 2019-01-16: qty 1

## 2019-01-16 MED ORDER — AMLODIPINE BESYLATE 5 MG PO TABS
5.0000 mg | ORAL_TABLET | Freq: Once | ORAL | Status: AC
  Administered 2019-01-16: 5 mg via ORAL
  Filled 2019-01-16: qty 1

## 2019-01-16 MED ORDER — HALOPERIDOL LACTATE 5 MG/ML IJ SOLN
1.0000 mg | Freq: Once | INTRAMUSCULAR | Status: AC
  Administered 2019-01-16: 1 mg via INTRAVENOUS
  Filled 2019-01-16: qty 1

## 2019-01-16 MED ORDER — AMLODIPINE BESYLATE 10 MG PO TABS
10.0000 mg | ORAL_TABLET | Freq: Every day | ORAL | Status: DC
  Administered 2019-01-17 – 2019-01-18 (×2): 10 mg via ORAL
  Filled 2019-01-16 (×2): qty 1

## 2019-01-16 NOTE — Progress Notes (Signed)
Physical Therapy Treatment Patient Details Name: [name] [name] MRN: [id number] DOB: 11-24-1929 Today's Date: 01/16/2019    History of Present Illness Pt is a 85 yo male with PMH significant for memory loss, chronic respiratory failure in setting of idiopathic pulmonary fibrosis on 5 L [place] at home, recent PE on Xarelto, AAA, GERD, being admitted with sepsis in setting of PNA/UTI and increasing aneurysm.Initial work up revealed increasing size of infrarenal abdominal aortic aneurysm and patch consolidation of the left lower lobe concerning for PNA. Vascular evaluated, no emergent indication for OR. Also found to have compression deformities of L2, L3.    PT Comments    Patient seen for mobility progression. Pt is making progress toward PT goals. Pt requires min/mod A for bed mobility and mod A +2 for gait training. Pt tolerated gait distance of 60 ft with RW. SpO2 desat to 82% on 4L O2 via [place]. Patient's wife present and prefers to take pt home at discharge. This therapist discussed pt's level of assistance needed for transfers and gait and recommended use of w/c for mobility in the home until HHPT can address this further as pt has a high risk of falls. Pt's wife expressed understanding.  Current recommendation remains appropriate. If pt and family decline post acute rehab pt will need HHPT.    Follow Up Recommendations  SNF;Supervision/Assistance - 24 hour     Equipment Recommendations  None recommended by PT    Recommendations for Other Services       Precautions / Restrictions Precautions Precautions: Fall Restrictions Weight Bearing Restrictions: No    Mobility  Bed Mobility Overal bed mobility: Needs Assistance Bed Mobility: Rolling;Sidelying to Sit Rolling: Min assist Sidelying to sit: Mod assist       General bed mobility comments: assist to bring bilat LE to EOB and to elevate trunk into sitting; multimodal cures for sequencing and for use of  rails  Transfers Overall transfer level: Needs assistance Equipment used: Rolling walker (2 wheeled) Transfers: Sit to/from Stand Sit to Stand: Mod assist         General transfer comment: cues for safe hand placement and assistance to power up into standing and to gain balance upon standing  Ambulation/Gait Ambulation/Gait assistance: Mod assist;+2 safety/equipment Gait Distance (Feet): 60 Feet Assistive device: Rolling walker (2 wheeled) Gait Pattern/deviations: Step-to pattern;Step-through pattern;Decreased step length - right;Decreased step length - left;Decreased dorsiflexion - left;Decreased dorsiflexion - right;Trunk flexed Gait velocity: decreased   General Gait Details: pt requires assistance to maintain balance and to guide RW; cues for upright posture, safe use of AD, and increased stride length   Stairs             Wheelchair Mobility    Modified Rankin (Stroke Patients Only)       Balance Overall balance assessment: Needs assistance Sitting-balance support: Feet supported Sitting balance-Leahy Scale: Fair     Standing balance support: During functional activity;Bilateral upper extremity supported Standing balance-Leahy Scale: Poor Standing balance comment: reliant on external support                            Cognition Arousal/Alertness: Awake/alert Behavior During Therapy: WFL for tasks assessed/performed Overall Cognitive Status: History of cognitive impairments - at baseline                                 General Comments: Pt pleasant, following  simple commands with multimodal cueing. Fidgeting with lines.      Exercises      General Comments General comments (skin integrity, edema, etc.): SpO2 desat with mobility to 82% on 4L O2 via [place]       Pertinent Vitals/Pain Pain Assessment: Faces Faces Pain Scale: Hurts a little bit Pain Location: back with movement Pain Descriptors / Indicators:  Grimacing;Guarding Pain Intervention(s): Limited activity within patient's tolerance;Monitored during session;Repositioned    Home Living                      Prior Function            PT Goals (current goals can now be found in the care plan section) Progress towards PT goals: Progressing toward goals    Frequency    Min 2X/week      PT Plan Current plan remains appropriate    Co-evaluation              AM-PAC PT "6 Clicks" Mobility   Outcome Measure  Help needed turning from your back to your side while in a flat bed without using bedrails?: A Little Help needed moving from lying on your back to sitting on the side of a flat bed without using bedrails?: A Lot Help needed moving to and from a bed to a chair (including a wheelchair)?: A Lot Help needed standing up from a chair using your arms (e.g., wheelchair or bedside chair)?: A Lot Help needed to walk in hospital room?: A Lot Help needed climbing 3-5 steps with a railing? : Total 6 Click Score: 12    End of Session Equipment Utilized During Treatment: Gait belt Activity Tolerance: Patient tolerated treatment well Patient left: with call bell/phone within reach;in chair;with family/visitor present Nurse Communication: Mobility status;Other (comment)(pt needs chair alarm pad) PT Visit Diagnosis: Difficulty in walking, not elsewhere classified (R26.2);Other abnormalities of gait and mobility (R26.89);Muscle weakness (generalized) (M62.81)     Time: [id number] PT Time Calculation (min) (ACUTE ONLY): 36 min  Charges:  $Gait Training: 23-37 mins                     Earney Navy, PTA Acute Rehabilitation Services Pager: [phone] Office: [phone]     Darliss Cheney 01/16/2019, 4:36 PM

## 2019-01-16 NOTE — Progress Notes (Addendum)
1945 Telemonitor called RN, pt pulling at condom catheter. RN to room, condom cath off, pt cleaned up and new condom cath applied.   2136 Pt still restless. RN to assess pt, pt saturated with urine. Pt bathed and moved to low bed for safety concern. WCTM.   0000 Telemonitor has been Cabin crew and NT throughout night. Pt very restless and pulling at IV line, condom cath, EKG leads, and taking off O2. Pt very restless in bed, kicking blankets off, pillow on floor. RN assessed pt for pain, pt c/o back pain, but after repositioning pt states he's, "ok". RN offered pt sips of ice water, denies being hungry, pt asked to brush his teeth and NT assisted pt with task. Mittens applied for safety. WCTM.   Around 0100, telemonitor called RN, pt has gotten mitten off and pulling at O2. RN to room and reapplied oxygen.  0330 Telemonitor sitter called RN, pt pulling at IV. When RN in room, pt had pulled IV out. Reoriented patient to place, time, level of care. RN asked charge RN to call MD for orders haldol, restraints, or physical sitter instead of tele sitter. Bilateral wrist restraints ordered. New IV inserted, wrist restraints applied, pt cleaned up and repositioned. Fall precautions in place, [facility].   [phone] Called Dr. Oletta Darter back with pt's Qtc, haldol ordered for patient. Pt resting in bed comfortably, WCTM.   0500 RN to patient's room, pt repositioned per request, haldol given, pt resting comfortably. WCTM.   [id number] Telemonitor called NT to inform that pt had gotten his mitten off. Mitten reapplied. Pt repositioned, WCTM.

## 2019-01-16 NOTE — Care Management Important Message (Signed)
Important Message  Patient Details  Name: [name] [name] MRN: [id number] Date of Birth: 29-Sep-1929   Medicare Important Message Given:  Yes     Baeleigh Devincent 01/16/2019, 3:17 PM

## 2019-01-16 NOTE — Progress Notes (Signed)
PROGRESS NOTE  Edwing Miyata [id number] DOB: [birth year] DOA: 01/12/2019 PCP: Kelton Pillar, MD  HPI/Recap of past 24 hours: [name] [name] is a 85 y.o. male with medical history significant of memory loss, idiopathic pulmonary fibrosis and chronic respiratory failure on 5 L home oxygen, history of PE on Xarelto, prostate cancer, spondylolisthesis, CVA/TIA, AAA, carotid artery stenosis, GERD, hyperlipidemia presenting with complaints of lower back pain.  Patient reported having lower back pain for several months.  He has no other complaints.  Per triage note, wife reported rectal bleeding.  No family available at this time.  Patient denies rectal bleeding or noticing any blood in his stool.  Denies abdominal pain.  Denies fevers, chills, cough, shortness of breath, or chest pain.  Denies dysuria, urinary frequency, or urgency.   ED Course: Not tachycardic.  Blood pressure stable.  Slightly tachypneic.  Afebrile.  On 5 L supplemental oxygen, same as home requirement.  White blood cell count 15.8.  Hemoglobin 9.6. FOBT pending.  UA pending.  CT abdomen pelvis showing increased size of the infrarenal abdominal aortic aneurysm sac measuring 7.2 x 5.8 cm with continued probable type II endoleak.  Patent aortobiiliac stent graft.  Worsening chronic interstitial lung changes at both lung bases with new airspace patchy consolidation in the anterior left lower lobe/lingula concerning for infection.  Age indeterminate, new since 2018, slight superior compression deformities of the L2 and L3 vertebral bodies.  Chronic pars defects at L5-S1 with grade 2 anterolisthesis of L5 on S1. Patient received morphine, Levaquin, and a 500 cc fluid bolus.  01/16/19: Patient was seen and examined at bedside this morning.  Agitation overnight.  Soft restraints in place for patient's own safety.  He is alert and oriented x2.  Reports right hip pain but does not elaborate further.  Assessment/Plan:  Principal Problem:   AAA (abdominal aortic aneurysm) (HCC) Active Problems:   IPF (idiopathic pulmonary fibrosis) (HCC)   Lower urinary tract infectious disease   Vertebral compression fracture ([place])   Community acquired pneumonia   Pneumonia   Pressure injury of skin   Goals of care, counseling/discussion   Palliative care by specialist  Worsening abdominal aortic aneurysm CT abdomen pelvis showing increased size of the infrarenal abdominal aortic aneurysm sac measuring 7.2 x 5.8 cm with continued probable type II endoleak.  Patent aortobiiliac stent graft by Dr. Donzetta Matters on 06/17/2016 -Currently hemodynamically stable.   Continue beta-blockers Continue blood pressure control  Acute metabolic encephalopathy, suspect delirium in the setting of acute illness Reorient as needed Soft restraints for patient's own safety Fall precautions  Vertebral compression fracture CT showing age indeterminate, new since 2018, slight superior compression deformities of the L2 and L3 vertebral bodies. -IV morphine as needed for pain Optimize pain control  Acute on chronic hypoxic respiratory failure likely secondary to community-acquired in the setting of idiopathic pulmonary fibrosis, advanced disease On 5 L of oxygen continuously at baseline Currently improving on 4 L and saturating in the upper 90s Continue to maintain O2 saturation greater than 92% Continue bronchodilators and IV antibiotics Rocephin and azithromycin  management per PCCM recommendations  Community-acquired pneumonia Afebrile.  White blood cell count 15.8.  Lactate normal.  On 5 L supplemental oxygen, same as home requirement.  CT chest showing advanced interstitial lung disease and fibrosis.  Increased consolidative change in the lingula since the prior CT from 08/17/2018, may be related to progression of fibrosis although superimposed pneumonia is not excluded. -SARS-CoV-2 test negative -Received Levaquin in the  ED. Management  as stated above -Continuous pulse ox -Supplemental oxygen -Continue to monitor white blood cell count Independently viewed chest x-ray done on admission which showed lingular infiltrates.  Uncontrolled hypertension Blood pressure not at goal Continue metoprolol tartrate 25 mg twice daily Added Norvasc 10 mg daily Continue to closely monitor  History of PE -Hold Xarelto at this time.  Pending vascular surgery evaluation for expanding AAA.  Idiopathic pulmonary fibrosis -Continue home supplemental oxygen  DVT prophylaxis: SCDs at this time Code Status: Full code Family Communication: No family available at this time. Disposition Plan: Anticipate discharge after clinical improvement.   Consultants:  PCCM     Objective: Vitals:   01/15/19 2038 01/16/19 0405 01/16/19 0500 01/16/19 1038  BP: (!) 155/100 (!) 155/102  (!) 180/94  Pulse: 95 83  89  Resp: 14 16  18   Temp: 98.2 F (36.8 C) 98 F (36.7 C)  97.8 F (36.6 C)  TempSrc: Oral Oral  Oral  SpO2: 97% 99%  98%  Weight:   73 kg   Height:        Intake/Output Summary (Last 24 hours) at 01/16/2019 1401 Last data filed at 01/16/2019 1134 Gross per 24 hour  Intake 2074.61 ml  Output 1225 ml  Net 849.61 ml   Filed Weights   01/12/19 1713 01/16/19 0500  Weight: 68 kg 73 kg    Exam:  . General: 85 y.o. year-old male well developed well nourished in no acute distress.  Alert and oriented x2. . Cardiovascular: Regular rate and rhythm with no rubs or gallops.  No thyromegaly or JVD noted.   Marland Kitchen Respiratory: Mild rales at bases with no wheezes.  Poor inspiratory effort. . Abdomen: Soft nontender nondistended with normal bowel sounds x4 quadrants. . Musculoskeletal: No lower extremity edema. 2/4 pulses in all 4 extremities. Marland Kitchen Psychiatry: Unable to assess mood due to confusion.   Data Reviewed: CBC: Recent Labs  Lab 01/12/19 1801 01/13/19 0237 01/15/19 0631 01/16/19 0710  WBC 15.8* 14.1* 8.6 13.7*   NEUTROABS 14.3*  --   --   --   HGB 9.6* 9.9* 8.8* 9.1*  HCT 30.6* 31.8* 27.9* 28.2*  MCV 93.0 93.8 92.1 89.5  PLT 243 249 185 [id number]   Basic Metabolic Panel: Recent Labs  Lab 01/12/19 1801 01/15/19 0631 01/16/19 0710  NA 137 142 141  K 4.6 4.0 3.5  CL 104 111 107  CO2 25 23 24   GLUCOSE 123* 150* 152*  BUN 18 20 18   CREATININE 0.90 0.90 0.90  CALCIUM 8.6* 7.7* 8.0*   GFR: Estimated Creatinine Clearance: 59.7 mL/min (by C-G formula based on SCr of 0.9 mg/dL). Liver Function Tests: Recent Labs  Lab 01/12/19 1801  AST 71*  ALT 93*  ALKPHOS 174*  BILITOT 0.9  PROT 6.8  ALBUMIN 1.9*   No results for input(s): LIPASE, AMYLASE in the last 168 hours. No results for input(s): AMMONIA in the last 168 hours. Coagulation Profile: No results for input(s): INR, PROTIME in the last 168 hours. Cardiac Enzymes: No results for input(s): CKTOTAL, CKMB, CKMBINDEX, TROPONINI in the last 168 hours. BNP (last 3 results) No results for input(s): PROBNP in the last 8760 hours. HbA1C: No results for input(s): HGBA1C in the last 72 hours. CBG: Recent Labs  Lab 01/15/19 2039 01/15/19 2349 01/16/19 0409 01/16/19 0759 01/16/19 1134  GLUCAP 200* 128* 147* 148* 184*   Lipid Profile: No results for input(s): CHOL, HDL, LDLCALC, TRIG, CHOLHDL, LDLDIRECT in the last 72 hours. Thyroid  Function Tests: No results for input(s): TSH, T4TOTAL, FREET4, T3FREE, THYROIDAB in the last 72 hours. Anemia Panel: No results for input(s): VITAMINB12, FOLATE, FERRITIN, TIBC, IRON, RETICCTPCT in the last 72 hours. Urine analysis:    Component Value Date/Time   COLORURINE AMBER (A) 01/12/2019 2201   APPEARANCEUR HAZY (A) 01/12/2019 2201   LABSPEC 1.036 (H) 01/12/2019 2201   PHURINE 5.0 01/12/2019 2201   GLUCOSEU NEGATIVE 01/12/2019 2201   HGBUR NEGATIVE 01/12/2019 2201   BILIRUBINUR NEGATIVE 01/12/2019 2201   KETONESUR NEGATIVE 01/12/2019 2201   PROTEINUR 30 (A) 01/12/2019 2201   UROBILINOGEN 0.2  04/25/2013 1449   NITRITE POSITIVE (A) 01/12/2019 2201   LEUKOCYTESUR LARGE (A) 01/12/2019 2201   Sepsis Labs: @LABRCNTIP (procalcitonin:4,lacticidven:4)  ) Recent Results (from the past 240 hour(s))  Novel Coronavirus, NAA (Hosp order, Send-out to Ref Lab; TAT 18-24 hrs     Status: None   Collection Time: 01/12/19 11:35 PM   Specimen: Nasopharyngeal Swab; Respiratory  Result Value Ref Range Status   SARS-CoV-2, NAA NOT DETECTED NOT DETECTED Final    Comment: (NOTE) This nucleic acid amplification test was developed and its performance characteristics determined by [employer]. Nucleic acid amplification tests include PCR and TMA. This test has not been FDA cleared or approved. This test has been authorized by FDA under an Emergency Use Authorization (EUA). This test is only authorized for the duration of time the declaration that circumstances exist justifying the authorization of the emergency use of in vitro diagnostic tests for detection of SARS-CoV-2 virus and/or diagnosis of COVID-19 infection under section 564(b)(1) of the Act, 21 U.S.C. [id number]) (1), unless the authorization is terminated or revoked sooner. When diagnostic testing is negative, the possibility of a false negative result should be considered in the context of a patient's recent exposures and the presence of clinical signs and symptoms consistent with COVID-19. An individual without symptoms of COVID- 19 and who is not shedding SARS-CoV-2 vi rus would expect to have a negative (not detected) result in this assay. Performed At: [facility] [address] [place], [place] [id number] Rush Farmer MD [id number]    [place]  Final    Comment: Performed at [facility], [place] [address], [place], [place] [address]  SARS Coronavirus 2 by RT PCR (hospital order, performed in [facility] hospital lab) Nasopharyngeal Nasopharyngeal Swab     Status: None    Collection Time: 01/13/19  2:19 PM   Specimen: Nasopharyngeal Swab  Result Value Ref Range Status   SARS Coronavirus 2 NEGATIVE NEGATIVE Final    Comment: (NOTE) If result is NEGATIVE SARS-CoV-2 target nucleic acids are NOT DETECTED. The SARS-CoV-2 RNA is generally detectable in upper and lower  respiratory specimens during the acute phase of infection. The lowest  concentration of SARS-CoV-2 viral copies this assay can detect is 250  copies / mL. A negative result does not preclude SARS-CoV-2 infection  and should not be used as the sole basis for treatment or other  patient management decisions.  A negative result may occur with  improper specimen collection / handling, submission of specimen other  than nasopharyngeal swab, presence of viral mutation(s) within the  areas targeted by this assay, and inadequate number of viral copies  (<250 copies / mL). A negative result must be combined with clinical  observations, patient history, and epidemiological information. If result is POSITIVE SARS-CoV-2 target nucleic acids are DETECTED. The SARS-CoV-2 RNA is generally detectable in upper and lower  respiratory specimens  dur ing the acute phase of infection.  Positive  results are indicative of active infection with SARS-CoV-2.  Clinical  correlation with patient history and other diagnostic information is  necessary to determine patient infection status.  Positive results do  not rule out bacterial infection or co-infection with other viruses. If result is PRESUMPTIVE POSTIVE SARS-CoV-2 nucleic acids MAY BE PRESENT.   A presumptive positive result was obtained on the submitted specimen  and confirmed on repeat testing.  While 2019 novel coronavirus  (SARS-CoV-2) nucleic acids may be present in the submitted sample  additional confirmatory testing may be necessary for epidemiological  and / or clinical management purposes  to differentiate between  SARS-CoV-2 and other Sarbecovirus  currently known to infect humans.  If clinically indicated additional testing with an alternate test  methodology [phone]) is advised. The SARS-CoV-2 RNA is generally  detectable in upper and lower respiratory sp ecimens during the acute  phase of infection. The expected result is Negative. Fact Sheet for Patients:  StrictlyIdeas.no Fact Sheet for Healthcare Providers: BankingDealers.co.za This test is not yet approved or cleared by the [place] FDA and has been authorized for detection and/or diagnosis of SARS-CoV-2 by FDA under an Emergency Use Authorization (EUA).  This EUA will remain in effect (meaning this test can be used) for the duration of the COVID-19 declaration under Section 564(b)(1) of the Act, 21 U.S.C. section 360bbb-3(b)(1), unless the authorization is terminated or revoked sooner. Performed at [facility], [place] [address] [place], [place] [address]   MRSA PCR Screening     Status: None   Collection Time: 01/13/19  4:43 PM   Specimen: Nasopharyngeal  Result Value Ref Range Status   MRSA by PCR NEGATIVE NEGATIVE Final    Comment:        The GeneXpert MRSA Assay (FDA approved for NASAL specimens only), is one component of a comprehensive MRSA colonization surveillance program. It is not intended to diagnose MRSA infection nor to guide or monitor treatment for MRSA infections. Performed at [facility], [place] [address] [place], [place] [address]   Respiratory Panel by PCR     Status: None   Collection Time: 01/13/19  6:03 PM   Specimen: Nasopharyngeal Swab; Respiratory  Result Value Ref Range Status   Adenovirus NOT DETECTED NOT DETECTED Final   Coronavirus 229E NOT DETECTED NOT DETECTED Final    Comment: (NOTE) The Coronavirus on the Respiratory Panel, DOES NOT test for the novel  Coronavirus (2019 nCoV)    Coronavirus HKU1 NOT DETECTED NOT DETECTED Final   Coronavirus NL63 NOT DETECTED  NOT DETECTED Final   Coronavirus OC43 NOT DETECTED NOT DETECTED Final   Metapneumovirus NOT DETECTED NOT DETECTED Final   Rhinovirus / Enterovirus NOT DETECTED NOT DETECTED Final   Influenza A NOT DETECTED NOT DETECTED Final   Influenza B NOT DETECTED NOT DETECTED Final   Parainfluenza Virus 1 NOT DETECTED NOT DETECTED Final   Parainfluenza Virus 2 NOT DETECTED NOT DETECTED Final   Parainfluenza Virus 3 NOT DETECTED NOT DETECTED Final   Parainfluenza Virus 4 NOT DETECTED NOT DETECTED Final   Respiratory Syncytial Virus NOT DETECTED NOT DETECTED Final   Bordetella pertussis NOT DETECTED NOT DETECTED Final   Chlamydophila pneumoniae NOT DETECTED NOT DETECTED Final   Mycoplasma pneumoniae NOT DETECTED NOT DETECTED Final    Comment: Performed at [facility], [place]. [address] [place], [place] [address]      Studies: No results found.  Scheduled Meds: . amLODipine  5 mg Oral Daily  . chlorhexidine  15 mL Mouth Rinse BID  . Chlorhexidine Gluconate Cloth  6 each Topical Daily  . feeding supplement (ENSURE ENLIVE)  237 mL Oral BID BM  . mouth rinse  15 mL Mouth Rinse q12n4p  . methylPREDNISolone (SOLU-MEDROL) injection  40 mg Intravenous Q12H  . metoprolol tartrate  25 mg Oral BID    Continuous Infusions: . azithromycin 500 mg (01/16/19 0017)  . cefTRIAXone (ROCEPHIN)  IV 2 g (01/15/19 2308)  . famotidine (PEPCID) IV 20 mg (01/16/19 1244)  . lactated ringers 100 mL/hr at 01/16/19 0513     LOS: 4 days     Kayleen Memos, MD [facility] Pager [phone]  If 7PM-7AM, please contact night-coverage [email or web address] Password [email or web address] 01/16/2019, 2:01 PM

## 2019-01-16 NOTE — Progress Notes (Signed)
Daily Progress Note   Patient Name: [name] [name]       Date: 01/16/2019 DOB: Apr 20, 1929  Age: 85 y.o. MRN#: [id number] Attending Physician: Kayleen Memos, DO Primary Care Physician: Kelton Pillar, MD Admit Date: 01/12/2019  Reason for Consultation/Follow-up: Establishing goals of care  Subjective: Agitation issues continue, has been moved out of ICU  Length of Stay: 4  Current Medications: Scheduled Meds:  . amLODipine  5 mg Oral Daily  . chlorhexidine  15 mL Mouth Rinse BID  . Chlorhexidine Gluconate Cloth  6 each Topical Daily  . feeding supplement (ENSURE ENLIVE)  237 mL Oral BID BM  . mouth rinse  15 mL Mouth Rinse q12n4p  . methylPREDNISolone (SOLU-MEDROL) injection  40 mg Intravenous Q12H  . metoprolol tartrate  25 mg Oral BID    Continuous Infusions: . azithromycin 500 mg (01/16/19 0017)  . cefTRIAXone (ROCEPHIN)  IV 2 g (01/15/19 2308)  . famotidine (PEPCID) IV 20 mg (01/16/19 1244)  . lactated ringers 100 mL/hr at 01/16/19 0513    PRN Meds: acetaminophen **OR** acetaminophen, guaiFENesin-dextromethorphan, morphine injection  Vital Signs: BP (!) 180/94 (BP Location: Left Arm)   Pulse 89   Temp 97.8 F (36.6 C) (Oral)   Resp 18   Ht 5\' 10"  (1.778 m)   Wt 73 kg   SpO2 98%   BMI 23.10 kg/m  SpO2: SpO2: 98 % O2 Device: O2 Device: Nasal Cannula O2 Flow Rate: O2 Flow Rate (L/min): 4 L/min  Intake/output summary:   Intake/Output Summary (Last 24 hours) at 01/16/2019 1339 Last data filed at 01/16/2019 1134 Gross per 24 hour  Intake 2074.61 ml  Output 1225 ml  Net 849.61 ml   LBM: Last BM Date: 01/15/19 Baseline Weight: Weight: 68 kg Most recent weight: Weight: 73 kg       Palliative Assessment/Data: PPS 40%   Flowsheet Rows     Most Recent Value   Intake Tab  Referral Department  Critical care  Unit at Time of Referral  ICU  Palliative Care Primary Diagnosis  Pulmonary  Date Notified  01/13/19  Palliative Care Type  New Palliative care  Reason for referral  Clarify Goals of Care  Date of Admission  01/12/19  Date first seen by Palliative Care  01/14/19  # of days Palliative referral response time  1 Day(s)  # of days IP prior to Palliative referral  1  Clinical Assessment  Palliative Performance Scale Score  40%  Psychosocial & Spiritual Assessment  Palliative Care Outcomes  Patient/Family meeting held?  Yes  Who was at the meeting?  wife  [place] regarding hospice, Provided psychosocial or spiritual support      Patient Active Problem List   Diagnosis Date Noted  . Goals of care, counseling/discussion   . Palliative care by specialist   . Vertebral compression fracture ([place]) 01/13/2019  . Community acquired pneumonia 01/13/2019  . Pneumonia 01/13/2019  . Pressure injury of skin 01/13/2019  . Lower urinary tract infectious disease 10/20/2018  . TIA (transient ischemic attack)   . Pulmonary fibrosis ([place])   . Prostate cancer ([place])   . GERD (gastroesophageal reflux disease)   . Diplopia   .  Carotid artery stenosis   . Allergy   . AAA (abdominal aortic aneurysm) ([place])   . Cognitive decline   . Multiple subsegmental pulmonary emboli without acute cor pulmonale ([place]) 08/24/2018  . Lobar pneumonia, unspecified organism ([place]) 08/24/2018  . Hyperlipidemia 05/31/2017  . Chronic hypoxemic respiratory failure ([place]) 07/20/2016  . IPF (idiopathic pulmonary fibrosis) ([place]) 07/11/2015  . H/O endovascular stent graft for abdominal aortic aneurysm 07/11/2015  . H/O carotid endarterectomy 07/11/2015  . History of stroke 05/06/2015  . Swelling of ankle joint 04/23/2014  . Memory loss 05/29/2013  . Mild cognitive impairment 05/29/2013  . CAD (coronary artery disease) 05/25/2013  . AAA (abdominal  aortic aneurysm) without rupture ([place]) 05/15/2013  . Occlusion and stenosis of carotid artery with cerebral infarction 04/19/2013  . Cerebral infarction ([place]) 04/18/2013  . Other and unspecified hyperlipidemia 04/18/2013  . Carotid stenosis 04/18/2013  . Stroke ([place]) 04/18/2013  . Spondylisthesis 2015  . Occlusion and stenosis of carotid artery without mention of cerebral infarction 10/12/2011    Palliative Care Assessment & Plan   HPI: 85 y.o. male  with past medical history of idiopathic pulmonary fibrosis on 5 L oxygen at home, PE on xarelto, AAA, and GERD admitted on 01/12/2019 with persistent lower back pain and cough. Diagnosed with sepsis in setting of pna/uti and increasing AAA. BP soft, tachycardic, and increasing oxygen needs warranted admission to ICU. Patient receiving IV antibiotics and increased oxygen support. No plans for surgical intervention for AAA. PMT consulted for [place].  Assessment: Multiple family discussions today with wife and 2 daughters. During these discussions we spoke about patient's condition and then discussed going home with home health & palliative vs home with hospice. Family is aware patient is eligible for hospice support and they are not opposed to this - however, they are interested in starting off with home health to attempt some rehabilitation and transitioning to hospice later. We discussed a trial of this - if it does not go well, can transition to hospice outpatient. Will have extra support of palliative care to help assess goals of care and adjust plan as necessary.   Spoke with hospice liaison to ensure palliative follow up.   Recommendations/Plan:  Consulted case management for home health, palliative referral, and assist wife with increasing private caregiver support  No follow up planned - family has PMT number if we are needed  Code Status:  DNR  Prognosis:   < 6 months  Discharge Planning:  To Be Determined  Care plan was  discussed with patient's wife, RN  Thank you for allowing the Palliative Medicine Team to assist in the care of this patient.   Total Time 35 minutes Prolonged Time Billed  no    The above conversation was completed via telephone due to the visitor restrictions during the COVID-19 pandemic. Thorough chart review and discussion with necessary members of the care team was completed as part of assessment. All issues were discussed and addressed but no physical exam was performed.    Greater than 50%  of this time was spent counseling and coordinating care related to the above assessment and plan.  Juel Burrow, DNP, [facility] Palliative Medicine Team Team Phone # [phone]  Pager [phone]

## 2019-01-16 NOTE — Progress Notes (Signed)
[facility] - Community Based Palliative Care   Received request from [place] for family interest in community based Palliative Care after discharge. Will follow up with MD office and family and confirm first contact in the home.   Thank you,  [name] Conte, LCSW [phone]  [name] [name] are listed daily on AMION under Hospice and [place]

## 2019-01-16 NOTE — TOC Initial Note (Addendum)
Transition of Care [facility]) - Initial/Assessment Note    Patient Details  Name: [name] [name] MRN: [id number] Date of Birth: 1929/12/22  Transition of [facility]) CM/SW Contact:    Bartholomew Crews, RN Phone Number: [phone] 01/16/2019, 3:31 PM  Clinical Narrative:                 Received consult to speak with wife about [facility] services. Chart reviewed. PTA home with spouse - used wheelchair to get around. PT recommending SNF.   Attempt to speak to wife at bedside, but advised that she had stepped out but return shortly.   Update: Spoke with spouse at the bedside. PTA home with spouse. Has home care services 2 days a week with [facility]. Discussed HH recommendations. Provided choice list. Discussed difference between home health and home care services. Referral made to [facility] for RN, PT, OT - accepted. Patient will need [place] orders for RN, PT, OT with Face to Face at discharge. Discussed DME, patient would benefit from 3N1. Patient will need DME order for 3N1. Patient already has shower chair, wheelchair, trapeze bar, hand rails, and rollator. Discussed transportation home - patient will require PTAR transport at discharge.   Spoke with Harmon Pier at [facility] to verify referral for palliative services - confirmed.   TOC following for transition needs.   Expected Discharge Plan: [place] Barriers to Discharge: Continued Medical Work up   Patient Goals and CMS Choice   CMS Medicare.gov Compare Post Acute Care list provided to:: Patient Represenative (must comment) Choice offered to / list presented to : Spouse  Expected Discharge Plan and Services Expected Discharge Plan: [place] In-house Referral: Clinical Social Work Discharge Planning Services: CM Consult Post Acute Care Choice: [place] arrangements for the past 2 months: Single Family Home                                      Prior Living  Arrangements/Services Living arrangements for the past 2 months: Single Family Home Lives with:: Self, Spouse              Current home services: DME    Activities of Daily Living      Permission Sought/Granted                  Emotional Assessment              Admission diagnosis:  Lower urinary tract infectious disease [N39.0] AAA (abdominal aortic aneurysm) ([place]) [I71.4] Abdominal aortic aneurysm (AAA) without rupture ([place]) [I71.4] Community acquired pneumonia, unspecified laterality [J18.9] Compression fracture of L3 vertebra, initial encounter ([place]) [id number] Pneumonia [J18.9] Patient Active Problem List   Diagnosis Date Noted  . Goals of care, counseling/discussion   . Palliative care by specialist   . Vertebral compression fracture ([place]) 01/13/2019  . Community acquired pneumonia 01/13/2019  . Pneumonia 01/13/2019  . Pressure injury of skin 01/13/2019  . Lower urinary tract infectious disease 10/20/2018  . TIA (transient ischemic attack)   . Pulmonary fibrosis ([place])   . Prostate cancer ([place])   . GERD (gastroesophageal reflux disease)   . Diplopia   . Carotid artery stenosis   . Allergy   . AAA (abdominal aortic aneurysm) ([place])   . Cognitive decline   . Multiple subsegmental pulmonary emboli without acute cor pulmonale ([place]) 08/24/2018  . Lobar  pneumonia, unspecified organism ([place]) 08/24/2018  . Hyperlipidemia 05/31/2017  . Chronic hypoxemic respiratory failure ([place]) 07/20/2016  . IPF (idiopathic pulmonary fibrosis) ([place]) 07/11/2015  . H/O endovascular stent graft for abdominal aortic aneurysm 07/11/2015  . H/O carotid endarterectomy 07/11/2015  . History of stroke 05/06/2015  . Swelling of ankle joint 04/23/2014  . Memory loss 05/29/2013  . Mild cognitive impairment 05/29/2013  . CAD (coronary artery disease) 05/25/2013  . AAA (abdominal aortic aneurysm) without rupture ([place]) 05/15/2013  . Occlusion and stenosis of carotid artery with  cerebral infarction 04/19/2013  . Cerebral infarction ([place]) 04/18/2013  . Other and unspecified hyperlipidemia 04/18/2013  . Carotid stenosis 04/18/2013  . Stroke ([place]) 04/18/2013  . Spondylisthesis 2015  . Occlusion and stenosis of carotid artery without mention of cerebral infarction 10/12/2011   PCP:  Kelton Pillar, MD Pharmacy:   [facility], [place] [address] Phone: [phone] Fax: [place] - [address] [id number] [facility] [address] [place] [address] Phone: [phone] Fax: [phone]     Social Determinants of Health (SDOH) Interventions    Readmission Risk Interventions No flowsheet data found.

## 2019-01-16 NOTE — Progress Notes (Signed)
NAME:  [name] [name], MRN:  [id number], DOB:  1929/07/20, LOS: 4 ADMISSION DATE:  01/12/2019, CONSULTATION DATE: 10/24 REFERRING MD: ED doc, CHIEF COMPLAINT: Pneumonia, urinary tract infection, respiratory failure  Brief History   85 yo male with PMH significant for memory loss, chronic respiratory failure in setting of idiopathic pulmonary fibrosis on 5 L [place] at home, recent PE on Xarelto, AAA, GERD, being admitted with sepsis in setting of PNA/UTI and increasing aneurysm. Vascular evaluated, no emergent indication for OR.   Past Medical History  IPF (prednisone), family decided to not pursue anti-fibrotics Chronic hypoxemia: 5 L [place] Subsegmental PE (on Xarelto) GERD Stroke AAA CAD Memory Loss/Coginitive Decline  Significant Hospital Events   Admitting 10/24  Consults:  PCCM Vascular  Procedures:  Not applicable  Significant Diagnostic Tests:  10/24 CT scan of the chest Advanced interstitial lung disease and fibrosis. Increased consolidative change in the lingula since the prior CT of 08/17/2018 may be related to progression of fibrosis, although superimposed pneumonia is not excluded. Clinical correlation is recommended.  Aortic Atherosclerosis (ICD10-I70.0). Micro Data:  Urine Cx 10/24 >>Advanced ILD/F.  Increased consolidation in the lingula.    Antimicrobials:  Rocephin 10/24 >> Azithro 10/24 >>    Interim history/subjective:  In bed with wife at bedside. Pulling at mittens, restraints.   Objective   Blood pressure (!) 155/102, pulse 83, temperature 98 F (36.7 C), temperature source Oral, resp. rate 16, height 5\' 10"  (1.778 m), weight 73 kg, SpO2 99 %.        Intake/Output Summary (Last 24 hours) at 01/16/2019 1020 Last data filed at 01/16/2019 0700 Gross per 24 hour  Intake 2420.75 ml  Output 1150 ml  Net 1270.75 ml   Filed Weights   01/12/19 1713 01/16/19 0500  Weight: 68 kg 73 kg    Examination: General: elderly male in NAD HENT:  [place]/AT, PERRL, no JVD Lungs: Clear, unlabored.  Cardiovascular: S1-S2 appreciated, no murmur Abdomen: Soft, non-tender, non-distended Extremities: No acute deformity or ROM limitation. No edema.  Neuro: Some delirium. Pulling at mittens. Seems to be greatly improved from what was described prior.   Resolved Hospital Problem list     Assessment & Plan:   Acute on chronic respiratory failure Idiopathic pulmonary fibrosis-advanced disease on home O2.  Pneumonia -On antibiotics today will be day 4 of azithromycin and Rocephin -Continue oxygen supplementation -Wean steroids slowly. May be contributing to his agitation. On prednisone 10 daily at home.   Multilobar pneumonia -Empiric Rocephin/azithromycin. Course per primary  Hypoxemic respiratory failure -Oxygen supplementation to keep saturations greater than 90%  Delirium: with history of memory loss.  - PRN haldol. QTC has been borderline limiting the initiation of scheduled medication.   Abdominal aortic aneurysm -Previous aortobiiliac endovascular stent graft by Dr. Donzetta Matters, 06/17/2016 -Has a known endoleak -Increase in size of endoleak from previous -On beta-blockers -Blood pressure control  Risk of decompensation from his comorbidities is high - Patient is DNR - Palliative care meeting today to determine discharge goals. Home health + palliative vs home hospice.   Wife updated at bedside.   Labs   CBC: Recent Labs  Lab 01/12/19 1801 01/13/19 0237 01/15/19 0631 01/16/19 0710  WBC 15.8* 14.1* 8.6 13.7*  NEUTROABS 14.3*  --   --   --   HGB 9.6* 9.9* 8.8* 9.1*  HCT 30.6* 31.8* 27.9* 28.2*  MCV 93.0 93.8 92.1 89.5  PLT 243 249 185 [id number]    Basic Metabolic Panel: Recent Labs  Lab  01/12/19 1801 01/15/19 0631 01/16/19 0710  NA 137 142 141  K 4.6 4.0 3.5  CL 104 111 107  CO2 25 23 24   GLUCOSE 123* 150* 152*  BUN 18 20 18   CREATININE 0.90 0.90 0.90  CALCIUM 8.6* 7.7* 8.0*   GFR: Estimated Creatinine Clearance:  59.7 mL/min (by C-G formula based on SCr of 0.9 mg/dL). Recent Labs  Lab 01/12/19 1801 01/13/19 0220 01/13/19 0237 01/13/19 1829 01/14/19 0414 01/15/19 0413 01/15/19 0631 01/16/19 0710  PROCALCITON  --   --   --  0.98 0.89 0.50  --   --   WBC 15.8*  --  14.1*  --   --   --  8.6 13.7*  LATICACIDVEN  --  1.0  --   --   --   --   --   --     Liver Function Tests: Recent Labs  Lab 01/12/19 1801  AST 71*  ALT 93*  ALKPHOS 174*  BILITOT 0.9  PROT 6.8  ALBUMIN 1.9*   No results for input(s): LIPASE, AMYLASE in the last 168 hours. No results for input(s): AMMONIA in the last 168 hours.  ABG    Component Value Date/Time   PHART 7.440 06/11/2016 1610   PCO2ART 37.8 06/11/2016 1610   PO2ART 62.9 (L) 06/11/2016 1610   HCO3 25.3 06/11/2016 1610   O2SAT 91.8 06/11/2016 [place], AGACNP-BC [place] Pager [phone] or [phone]  01/16/2019 10:26 AM

## 2019-01-16 NOTE — Progress Notes (Addendum)
[place] Progress Note Patient Name: [name] [name] DOB: 04-16-1929 MRN: [id number]   Date of Service  01/16/2019  HPI/Events of Note  Agitation - Request for bilateral soft wrist restraints and Haldol. QTc interval = 0.465 seconds.   eICU Interventions  Will order: 1.  Bilateral soft wrist restraints X 5 hours. Day rounding team to reassess and reorder in AM.  2. Haldol 1 mg IV now.      Intervention Category Major Interventions: Delirium, psychosis, severe agitation - evaluation and management  [name],[name] Eugene 01/16/2019, 3:37 AM

## 2019-01-17 DIAGNOSIS — J189 Pneumonia, unspecified organism: Secondary | ICD-10-CM

## 2019-01-17 DIAGNOSIS — I714 Abdominal aortic aneurysm, without rupture: Secondary | ICD-10-CM | POA: Diagnosis not present

## 2019-01-17 LAB — PROCALCITONIN: Procalcitonin: 0.18 ng/mL

## 2019-01-17 LAB — GLUCOSE, CAPILLARY
Glucose-Capillary: 150 mg/dL — ABNORMAL HIGH (ref 70–99)
Glucose-Capillary: 160 mg/dL — ABNORMAL HIGH (ref 70–99)
Glucose-Capillary: 242 mg/dL — ABNORMAL HIGH (ref 70–99)
Glucose-Capillary: 265 mg/dL — ABNORMAL HIGH (ref 70–99)
Glucose-Capillary: 194 mg/dL — ABNORMAL HIGH (ref 70–99)

## 2019-01-17 MED ORDER — QUETIAPINE FUMARATE 25 MG PO TABS
25.0000 mg | ORAL_TABLET | Freq: Two times a day (BID) | ORAL | Status: DC | PRN
  Administered 2019-01-17 (×2): 25 mg via ORAL
  Filled 2019-01-17 (×2): qty 1

## 2019-01-17 MED ORDER — SODIUM CHLORIDE 0.9 % IV SOLN
INTRAVENOUS | Status: DC | PRN
  Administered 2019-01-16: 23:00:00 via INTRAVENOUS

## 2019-01-17 NOTE — Progress Notes (Signed)
NAME:  [name] [name], MRN:  [id number], DOB:  22-Nov-1929, LOS: 5 ADMISSION DATE:  01/12/2019, CONSULTATION DATE: 10/24 REFERRING MD: ED doc, CHIEF COMPLAINT: Pneumonia, urinary tract infection, respiratory failure  Brief History   85 yo male with PMH significant for memory loss, chronic respiratory failure in setting of idiopathic pulmonary fibrosis on 5 L [place] at home, recent PE on Xarelto, AAA, GERD, being admitted with sepsis in setting of PNA/UTI and increasing aneurysm. Vascular evaluated, no emergent indication for OR.   Past Medical History  IPF (prednisone), family decided to not pursue anti-fibrotics Chronic hypoxemia: 5 L [place] Subsegmental PE (on Xarelto) GERD Stroke AAA CAD Memory Loss/Coginitive Decline  Significant Hospital Events   Admitting 10/24  Consults:  PCCM Vascular  Procedures:  Not applicable  Significant Diagnostic Tests:  10/24 CT scan of the chest Advanced interstitial lung disease and fibrosis. Increased consolidative change in the lingula since the prior CT of 08/17/2018 may be related to progression of fibrosis, although superimposed pneumonia is not excluded. Clinical correlation is recommended.  Aortic Atherosclerosis (ICD10-I70.0). Micro Data:  Urine Cx 10/24 >>Advanced ILD/F.  Increased consolidation in the lingula.    Antimicrobials:  Rocephin 10/24 >> Azithro 10/24 >>    Interim history/subjective:  Resting comfortably in bed this morning.   Objective   Blood pressure (!) 148/94, pulse 74, temperature 97.9 F (36.6 C), temperature source Oral, resp. rate 18, height 5\' 10"  (1.778 m), weight 73 kg, SpO2 99 %.        Intake/Output Summary (Last 24 hours) at 01/17/2019 0955 Last data filed at 01/17/2019 0505 Gross per 24 hour  Intake 2019.74 ml  Output 3000 ml  Net -980.26 ml   Filed Weights   01/12/19 1713 01/16/19 0500 01/17/19 0500  Weight: 68 kg 73 kg 73 kg    Examination: General: elderly male resting  comfortably in bed HENT: [place]/AT, no JVD Lungs: Clear, unlabored.  Cardiovascular: RRR Abdomen: Non-distended Extremities: No acute deformity. No edema.  Neuro: sleeping.   Resolved Hospital Problem list     Assessment & Plan:   Acute on chronic hypoxemic respiratory failure Idiopathic pulmonary fibrosis-advanced disease on home O2.  Pneumonia - Primary team planning discharge to home with palliative consult and anticipated transition to hospice if he cannot functionally improve.  - Antibiotic course per primary.  - Continue oxygen supplementation 5L per home regimen.  - Wean steroids slowly down to home rate of 10mg  daily. Likely contributing to his delirium.  - Family has indicated they wish for PCCM to be his palliative/PCP moving forward and home health will contact our office once he is discharged.   Delirium: with history of memory loss.  - Per primary  Abdominal aortic aneurysm -per primary. Holding AC.    PCCM will sign off.    Labs   CBC: Recent Labs  Lab 01/12/19 1801 01/13/19 0237 01/15/19 0631 01/16/19 0710  WBC 15.8* 14.1* 8.6 13.7*  NEUTROABS 14.3*  --   --   --   HGB 9.6* 9.9* 8.8* 9.1*  HCT 30.6* 31.8* 27.9* 28.2*  MCV 93.0 93.8 92.1 89.5  PLT 243 249 185 [id number]    Basic Metabolic Panel: Recent Labs  Lab 01/12/19 1801 01/15/19 0631 01/16/19 0710  NA 137 142 141  K 4.6 4.0 3.5  CL 104 111 107  CO2 25 23 24   GLUCOSE 123* 150* 152*  BUN 18 20 18   CREATININE 0.90 0.90 0.90  CALCIUM 8.6* 7.7* 8.0*   GFR: Estimated  Creatinine Clearance: 59.7 mL/min (by C-G formula based on SCr of 0.9 mg/dL). Recent Labs  Lab 01/12/19 1801 01/13/19 0220 01/13/19 0237 01/13/19 1829 01/14/19 0414 01/15/19 0413 01/15/19 0631 01/16/19 0710 01/17/19 0716  PROCALCITON  --   --   --  0.98 0.89 0.50  --   --  0.18  WBC 15.8*  --  14.1*  --   --   --  8.6 13.7*  --   LATICACIDVEN  --  1.0  --   --   --   --   --   --   --     Liver Function Tests: Recent  Labs  Lab 01/12/19 1801  AST 71*  ALT 93*  ALKPHOS 174*  BILITOT 0.9  PROT 6.8  ALBUMIN 1.9*   No results for input(s): LIPASE, AMYLASE in the last 168 hours. No results for input(s): AMMONIA in the last 168 hours.  ABG    Component Value Date/Time   PHART 7.440 06/11/2016 1610   PCO2ART 37.8 06/11/2016 1610   PO2ART 62.9 (L) 06/11/2016 1610   HCO3 25.3 06/11/2016 1610   O2SAT 91.8 06/11/2016 [place], AGACNP-BC [place] Pager [phone] or [phone]  01/17/2019 9:55 AM

## 2019-01-17 NOTE — Progress Notes (Signed)
PROGRESS NOTE  [name] [name] [id number] DOB: 01/14/1930 DOA: 01/12/2019 PCP: Kelton Pillar, MD  HPI/Recap of past 24 hours: [name] [name] is a 85 y.o. male with medical history significant of memory loss, idiopathic pulmonary fibrosis and chronic respiratory failure on 5 L home oxygen, history of PE on Xarelto, prostate cancer, spondylolisthesis, CVA/TIA, AAA, carotid artery stenosis, GERD, hyperlipidemia presenting with complaints of lower back pain.  Patient reported having lower back pain for several months.  He has no other complaints.  Per triage note, wife reported rectal bleeding.  No family available at this time.  Patient denies rectal bleeding or noticing any blood in his stool.  Denies abdominal pain.  Denies fevers, chills, cough, shortness of breath, or chest pain.  Denies dysuria, urinary frequency, or urgency.   ED Course: Not tachycardic.  Blood pressure stable.  Slightly tachypneic.  Afebrile.  On 5 L supplemental oxygen, same as home requirement.  White blood cell count 15.8.  Hemoglobin 9.6. FOBT pending.  UA pending.  CT abdomen pelvis showing increased size of the infrarenal abdominal aortic aneurysm sac measuring 7.2 x 5.8 cm with continued probable type II endoleak.  Patent aortobiiliac stent graft.  Worsening chronic interstitial lung changes at both lung bases with new airspace patchy consolidation in the anterior left lower lobe/lingula concerning for infection.  Age indeterminate, new since 2018, slight superior compression deformities of the L2 and L3 vertebral bodies.  Chronic pars defects at L5-S1 with grade 2 anterolisthesis of L5 on S1. Patient received morphine, Levaquin, and a 500 cc fluid bolus.  01/16/19: Patient was seen and examined at bedside this morning.  Agitation overnight.  Soft restraints in place for patient's own safety.  He is alert and oriented x2.  Reports right hip pain but does not elaborate further.  01/17/19: Patient was seen  and examined at his bedside this morning.  Agitation reported overnight.  This morning he is alert and oriented x1.  He knows the year.  Assessment/Plan: Principal Problem:   AAA (abdominal aortic aneurysm) (HCC) Active Problems:   IPF (idiopathic pulmonary fibrosis) (HCC)   Lower urinary tract infectious disease   Vertebral compression fracture ([place])   Community acquired pneumonia   Pneumonia   Pressure injury of skin   Goals of care, counseling/discussion   Palliative care by specialist  Worsening abdominal aortic aneurysm CT abdomen pelvis showing increased size of the infrarenal abdominal aortic aneurysm sac measuring 7.2 x 5.8 cm with continued probable type II endoleak.  Patent aortobiiliac stent graft by Dr. Donzetta Matters on 06/17/2016 -Currently hemodynamically stable.   Continue beta-blockers Continue blood pressure control Xarelto on hold  Acute metabolic encephalopathy, suspect delirium in the setting of acute illness Reorient as needed Soft restraints for patient's own safety Fall precautions Seroquel started overnight for agitation, continue  Vertebral compression fracture CT showing age indeterminate, new since 2018, slight superior compression deformities of the L2 and L3 vertebral bodies. -IV morphine as needed for pain Optimize pain control  Acute on chronic hypoxic respiratory failure likely secondary to community-acquired in the setting of idiopathic pulmonary fibrosis, advanced disease On 5 L of oxygen continuously at baseline Currently improving on 4 L and saturating in the upper 90s Continue to maintain O2 saturation greater than 92% Continue bronchodilators and IV antibiotics Rocephin and azithromycin  management per PCCM recommendations  Community-acquired pneumonia Afebrile.  White blood cell count 15.8.  Lactate normal.  On 5 L supplemental oxygen, same as home requirement.  CT chest showing  advanced interstitial lung disease and fibrosis.  Increased  consolidative change in the lingula since the prior CT from 08/17/2018, may be related to progression of fibrosis although superimposed pneumonia is not excluded. -SARS-CoV-2 test negative -Received Levaquin in the ED. Management as stated above -Continuous pulse ox -Supplemental oxygen -Continue to monitor white blood cell count Independently viewed chest x-ray done on admission which showed lingular infiltrates.  Uncontrolled hypertension Blood pressure not at goal Continue metoprolol tartrate 25 mg twice daily Added Norvasc 10 mg daily Continue to closely monitor  History of PE -Xarelto on hold due to abdominal aortic aneurysm measuring 7.2 x 5.8 cm.  Idiopathic pulmonary fibrosis -Continue home supplemental oxygen  DVT prophylaxis: SCDs at this time Code Status: Full code Family Communication: No family available at this time. Disposition Plan: Anticipate discharge possibly tomorrow 01/18/2019 With palliative.  Consultants:  PCCM   Palliative care team    Objective: Vitals:   01/16/19 2227 01/17/19 0420 01/17/19 0500 01/17/19 0857  BP:  (!) 153/93  (!) 148/94  Pulse: 81 75  74  Resp:  18  18  Temp:  98 F (36.7 C)  97.9 F (36.6 C)  TempSrc:  Oral  Oral  SpO2: 99%     Weight:   73 kg   Height:        Intake/Output Summary (Last 24 hours) at 01/17/2019 1502 Last data filed at 01/17/2019 1300 Gross per 24 hour  Intake 2259.74 ml  Output 2500 ml  Net -240.26 ml   Filed Weights   01/12/19 1713 01/16/19 0500 01/17/19 0500  Weight: 68 kg 73 kg 73 kg    Exam:   General: 85 y.o. year-old male well Developed Well Nourished in No Acute Distress.  Soft restraint in place.  Alert and oriented x1.  Cardiovascular: Regular rate and rhythm no rubs or gallops.  Respiratory: Mild rales at bases no wheezing noted.    Abdomen: Soft nontender nondistended bowel sounds present.  Musculoskeletal: No lower extremity edema.  Peripheral pulses in all 4 extremities.     Psychiatry: Mood is appropriate for condition and setting.   Data Reviewed: CBC: Recent Labs  Lab 01/12/19 1801 01/13/19 0237 01/15/19 0631 01/16/19 0710  WBC 15.8* 14.1* 8.6 13.7*  NEUTROABS 14.3*  --   --   --   HGB 9.6* 9.9* 8.8* 9.1*  HCT 30.6* 31.8* 27.9* 28.2*  MCV 93.0 93.8 92.1 89.5  PLT 243 249 185 [id number]   Basic Metabolic Panel: Recent Labs  Lab 01/12/19 1801 01/15/19 0631 01/16/19 0710  NA 137 142 141  K 4.6 4.0 3.5  CL 104 111 107  CO2 25 23 24   GLUCOSE 123* 150* 152*  BUN 18 20 18   CREATININE 0.90 0.90 0.90  CALCIUM 8.6* 7.7* 8.0*   GFR: Estimated Creatinine Clearance: 59.7 mL/min (by C-G formula based on SCr of 0.9 mg/dL). Liver Function Tests: Recent Labs  Lab 01/12/19 1801  AST 71*  ALT 93*  ALKPHOS 174*  BILITOT 0.9  PROT 6.8  ALBUMIN 1.9*   No results for input(s): LIPASE, AMYLASE in the last 168 hours. No results for input(s): AMMONIA in the last 168 hours. Coagulation Profile: No results for input(s): INR, PROTIME in the last 168 hours. Cardiac Enzymes: No results for input(s): CKTOTAL, CKMB, CKMBINDEX, TROPONINI in the last 168 hours. BNP (last 3 results) No results for input(s): PROBNP in the last 8760 hours. HbA1C: No results for input(s): HGBA1C in the last 72 hours. CBG: Recent Labs  Lab 01/16/19 1954 01/16/19 2341 01/17/19 0417 01/17/19 0719 01/17/19 1120  GLUCAP 171* 146* 150* 160* 242*   Lipid Profile: No results for input(s): CHOL, HDL, LDLCALC, TRIG, CHOLHDL, LDLDIRECT in the last 72 hours. Thyroid Function Tests: No results for input(s): TSH, T4TOTAL, FREET4, T3FREE, THYROIDAB in the last 72 hours. Anemia Panel: No results for input(s): VITAMINB12, FOLATE, FERRITIN, TIBC, IRON, RETICCTPCT in the last 72 hours. Urine analysis:    Component Value Date/Time   COLORURINE AMBER (A) 01/12/2019 2201   APPEARANCEUR HAZY (A) 01/12/2019 2201   LABSPEC 1.036 (H) 01/12/2019 2201   PHURINE 5.0 01/12/2019 2201   GLUCOSEU  NEGATIVE 01/12/2019 2201   HGBUR NEGATIVE 01/12/2019 2201   BILIRUBINUR NEGATIVE 01/12/2019 2201   KETONESUR NEGATIVE 01/12/2019 2201   PROTEINUR 30 (A) 01/12/2019 2201   UROBILINOGEN 0.2 04/25/2013 1449   NITRITE POSITIVE (A) 01/12/2019 2201   LEUKOCYTESUR LARGE (A) 01/12/2019 2201   Sepsis Labs: @LABRCNTIP (procalcitonin:4,lacticidven:4)  ) Recent Results (from the past 240 hour(s))  Novel Coronavirus, NAA (Hosp order, Send-out to Ref Lab; TAT 18-24 hrs     Status: None   Collection Time: 01/12/19 11:35 PM   Specimen: Nasopharyngeal Swab; Respiratory  Result Value Ref Range Status   SARS-CoV-2, NAA NOT DETECTED NOT DETECTED Final    Comment: (NOTE) This nucleic acid amplification test was developed and its performance characteristics determined by [employer]. Nucleic acid amplification tests include PCR and TMA. This test has not been FDA cleared or approved. This test has been authorized by FDA under an Emergency Use Authorization (EUA). This test is only authorized for the duration of time the declaration that circumstances exist justifying the authorization of the emergency use of in vitro diagnostic tests for detection of SARS-CoV-2 virus and/or diagnosis of COVID-19 infection under section 564(b)(1) of the Act, 21 U.S.C. [id number]) (1), unless the authorization is terminated or revoked sooner. When diagnostic testing is negative, the possibility of a false negative result should be considered in the context of a patient's recent exposures and the presence of clinical signs and symptoms consistent with COVID-19. An individual without symptoms of COVID- 19 and who is not shedding SARS-CoV-2 vi rus would expect to have a negative (not detected) result in this assay. Performed At: [facility] [address] [place], [place] [id number] Rush Farmer MD [id number]    [facility]  Final    Comment: Performed at [facility], [place] [address], [place], [place] [address]  SARS Coronavirus 2 by RT PCR (hospital order, performed in [facility] hospital lab) Nasopharyngeal Nasopharyngeal Swab     Status: None   Collection Time: 01/13/19  2:19 PM   Specimen: Nasopharyngeal Swab  Result Value Ref Range Status   SARS Coronavirus 2 NEGATIVE NEGATIVE Final    Comment: (NOTE) If result is NEGATIVE SARS-CoV-2 target nucleic acids are NOT DETECTED. The SARS-CoV-2 RNA is generally detectable in upper and lower  respiratory specimens during the acute phase of infection. The lowest  concentration of SARS-CoV-2 viral copies this assay can detect is 250  copies / mL. A negative result does not preclude SARS-CoV-2 infection  and should not be used as the sole basis for treatment or other  patient management decisions.  A negative result may occur with  improper specimen collection / handling, submission of specimen other  than nasopharyngeal swab, presence of viral mutation(s) within the  areas targeted by this assay, and inadequate number of viral copies  (<250 copies / mL).  A negative result must be combined with clinical  observations, patient history, and epidemiological information. If result is POSITIVE SARS-CoV-2 target nucleic acids are DETECTED. The SARS-CoV-2 RNA is generally detectable in upper and lower  respiratory specimens dur ing the acute phase of infection.  Positive  results are indicative of active infection with SARS-CoV-2.  Clinical  correlation with patient history and other diagnostic information is  necessary to determine patient infection status.  Positive results do  not rule out bacterial infection or co-infection with other viruses. If result is PRESUMPTIVE POSTIVE SARS-CoV-2 nucleic acids MAY BE PRESENT.   A presumptive positive result was obtained on the submitted specimen  and confirmed on repeat testing.  While 2019 novel coronavirus  (SARS-CoV-2) nucleic acids may be present in the  submitted sample  additional confirmatory testing may be necessary for epidemiological  and / or clinical management purposes  to differentiate between  SARS-CoV-2 and other Sarbecovirus currently known to infect humans.  If clinically indicated additional testing with an alternate test  methodology [phone]) is advised. The SARS-CoV-2 RNA is generally  detectable in upper and lower respiratory sp ecimens during the acute  phase of infection. The expected result is Negative. Fact Sheet for Patients:  StrictlyIdeas.no Fact Sheet for Healthcare Providers: BankingDealers.co.za This test is not yet approved or cleared by the [place] FDA and has been authorized for detection and/or diagnosis of SARS-CoV-2 by FDA under an Emergency Use Authorization (EUA).  This EUA will remain in effect (meaning this test can be used) for the duration of the COVID-19 declaration under Section 564(b)(1) of the Act, 21 U.S.C. section 360bbb-3(b)(1), unless the authorization is terminated or revoked sooner. Performed at [facility], [place] [address] [place], [place] [address]   MRSA PCR Screening     Status: None   Collection Time: 01/13/19  4:43 PM   Specimen: Nasopharyngeal  Result Value Ref Range Status   MRSA by PCR NEGATIVE NEGATIVE Final    Comment:        The GeneXpert MRSA Assay (FDA approved for NASAL specimens only), is one component of a comprehensive MRSA colonization surveillance program. It is not intended to diagnose MRSA infection nor to guide or monitor treatment for MRSA infections. Performed at [facility], [place] [address] [place], [place] [address]   Respiratory Panel by PCR     Status: None   Collection Time: 01/13/19  6:03 PM   Specimen: Nasopharyngeal Swab; Respiratory  Result Value Ref Range Status   Adenovirus NOT DETECTED NOT DETECTED Final   Coronavirus 229E NOT DETECTED NOT DETECTED Final    Comment:  (NOTE) The Coronavirus on the Respiratory Panel, DOES NOT test for the novel  Coronavirus (2019 nCoV)    Coronavirus HKU1 NOT DETECTED NOT DETECTED Final   Coronavirus NL63 NOT DETECTED NOT DETECTED Final   Coronavirus OC43 NOT DETECTED NOT DETECTED Final   Metapneumovirus NOT DETECTED NOT DETECTED Final   Rhinovirus / Enterovirus NOT DETECTED NOT DETECTED Final   Influenza A NOT DETECTED NOT DETECTED Final   Influenza B NOT DETECTED NOT DETECTED Final   Parainfluenza Virus 1 NOT DETECTED NOT DETECTED Final   Parainfluenza Virus 2 NOT DETECTED NOT DETECTED Final   Parainfluenza Virus 3 NOT DETECTED NOT DETECTED Final   Parainfluenza Virus 4 NOT DETECTED NOT DETECTED Final   Respiratory Syncytial Virus NOT DETECTED NOT DETECTED Final   Bordetella pertussis NOT DETECTED NOT DETECTED Final   Chlamydophila pneumoniae NOT DETECTED NOT  DETECTED Final   Mycoplasma pneumoniae NOT DETECTED NOT DETECTED Final    Comment: Performed at [facility], [place] [address] [place], [place] [address]      Studies: No results found.  Scheduled Meds:  amLODipine  10 mg Oral Daily   chlorhexidine  15 mL Mouth Rinse BID   Chlorhexidine Gluconate Cloth  6 each Topical Daily   feeding supplement (ENSURE ENLIVE)  237 mL Oral BID BM   mouth rinse  15 mL Mouth Rinse q12n4p   methylPREDNISolone (SOLU-MEDROL) injection  40 mg Intravenous Q12H   metoprolol tartrate  25 mg Oral BID    Continuous Infusions:  sodium chloride 10 mL/hr at 01/16/19 2233   azithromycin 500 mg (01/16/19 2330)   cefTRIAXone (ROCEPHIN)  IV 2 g (01/17/19 0113)   famotidine (PEPCID) IV 20 mg (01/17/19 1404)   lactated ringers 50 mL/hr at 01/17/19 1056     LOS: 5 days     Kayleen Memos, MD [facility] Pager [phone]  If 7PM-7AM, please contact night-coverage [email or web address] Password [email or web address] 01/17/2019, 3:02 PM

## 2019-01-17 NOTE — Plan of Care (Signed)
  Problem: Education: Goal: Knowledge of General Education information will improve Description: Including pain rating scale, medication(s)/side effects and non-pharmacologic comfort measures Outcome: Progressing   Problem: Safety: Goal: Ability to remain free from injury will improve Outcome: Progressing   

## 2019-01-18 DIAGNOSIS — I714 Abdominal aortic aneurysm, without rupture: Secondary | ICD-10-CM | POA: Diagnosis not present

## 2019-01-18 LAB — GLUCOSE, CAPILLARY: Glucose-Capillary: 140 mg/dL — ABNORMAL HIGH (ref 70–99)

## 2019-01-18 MED ORDER — PANTOPRAZOLE SODIUM 40 MG PO TBEC: 40.0000 mg | DELAYED_RELEASE_TABLET | Freq: Every day | ORAL | Status: DC

## 2019-01-18 MED ORDER — QUETIAPINE FUMARATE 25 MG PO TABS: 25.0000 mg | ORAL_TABLET | Freq: Two times a day (BID) | ORAL | 0 refills | Status: DC

## 2019-01-18 MED ORDER — AMLODIPINE BESYLATE 10 MG PO TABS: 10.0000 mg | ORAL_TABLET | Freq: Every day | ORAL | 0 refills | Status: DC

## 2019-01-18 MED ORDER — PREDNISONE 10 MG PO TABS: 10.0000 mg | ORAL_TABLET | Freq: Every day | ORAL | Status: DC

## 2019-01-18 MED ORDER — METOPROLOL TARTRATE 25 MG PO TABS: 25.0000 mg | ORAL_TABLET | Freq: Two times a day (BID) | ORAL | 0 refills | Status: DC

## 2019-01-18 MED ORDER — ENSURE ENLIVE PO LIQD: 237.0000 mL | Freq: Two times a day (BID) | ORAL | 0 refills | Status: AC

## 2019-01-18 MED ORDER — ASPIRIN EC 81 MG PO TBEC: 81.0000 mg | DELAYED_RELEASE_TABLET | Freq: Every day | ORAL | Status: DC

## 2019-01-18 MED ORDER — CHLORHEXIDINE GLUCONATE CLOTH 2 % EX PADS: 6.0000 | MEDICATED_PAD | Freq: Every day | CUTANEOUS | 0 refills | Status: AC

## 2019-01-18 MED ORDER — RIVAROXABAN 10 MG PO TABS
10.0000 mg | ORAL_TABLET | Freq: Every day | ORAL | Status: DC
  Administered 2019-01-18: 10 mg via ORAL
  Filled 2019-01-18: qty 1

## 2019-01-18 MED ORDER — ROSUVASTATIN CALCIUM 5 MG PO TABS: 5.0000 mg | ORAL_TABLET | Freq: Every day | ORAL | Status: DC

## 2019-01-18 NOTE — Discharge Summary (Signed)
Discharge Summary  Frak Paolini [id number] DOB: [birth year]  PCP: Kelton Pillar, MD  Admit date: 01/12/2019 Discharge date: 01/18/2019  Time spent: 35 minutes  Recommendations for Outpatient Follow-up:  1. Follow-up with vascular surgery 2. Follow-up with your PCP 3. Take your medications as prescribed 4. Fall precautions  Discharge Diagnoses:  Active Hospital Problems   Diagnosis Date Noted   AAA (abdominal aortic aneurysm) ([place])    Goals of care, counseling/discussion    Palliative care by specialist    Vertebral compression fracture ([place]) 01/13/2019   Community acquired pneumonia 01/13/2019   Pneumonia 01/13/2019   Pressure injury of skin 01/13/2019   Lower urinary tract infectious disease 10/20/2018   IPF (idiopathic pulmonary fibrosis) ([place]) 07/11/2015    Resolved Hospital Problems  No resolved problems to display.    Discharge Condition: Stable  Diet recommendation: Resume previous diet  Vitals:   01/18/19 0728 01/18/19 0954  BP: 139/76 136/77  Pulse: 76 72  Resp: 18   Temp: 98 F (36.7 C)   SpO2: 100%     History of present illness:  [name] [name] a 85 y.o.malewith medical history significant ofmemory loss, idiopathic pulmonary fibrosis and chronic respiratory failure on 5 L home oxygen continuously,history of PE on Xarelto,prostate cancer, spondylolisthesis, CVA/TIA, AAA, carotid artery stenosis, GERD, hyperlipidemia presenting with complaints of lower back pain.Patient reportedhaving lower back pain for several months.He has no other complaints. Per triage note, wife reported rectal bleeding. No family available at this time. Patient denies rectal bleeding or noticing any blood in his stool. Denies abdominal pain. Denies fevers, chills, cough, shortness of breath, or chest pain. Denies dysuria, urinary frequency, or urgency.  Fecal occult blood test negative on 01/12/2019.  Hemoglobin trending up from 8.8 on  01/15/2019 to 9.1 on 01/16/2019 with MCV of 89.  No sign of overt bleeding.  ED Course:Not tachycardic. Blood pressure stable. Slightly tachypneic. Afebrile. On 5 L supplemental oxygen, same as home requirement. White blood cell count 15.8. Hemoglobin 9.6.FOBT negative. CT abdomen pelvis with contrast done on 01/12/2019 showing increased size of the infrarenal abdominal aortic aneurysm sac measuring 7.2 x 5.8 cm with continued probable type II endoleak. Patent aortobiiliac stent graft. Worsening chronic interstitial lung changes at both lung bases with new airspace patchy consolidation in the anterior left lower lobe/lingula concerning for infection. Age indeterminate, new since 2018, slight superior compression deformities of the L2 and L3 vertebral bodies. Chronic pars defects at L5-S1 with grade 2 anterolisthesis of L5 on S1.  Hospital course complicated by confusion and agitation for which Seroquel was started.  Agitation improved on Seroquel 25 mg twice daily.  The patient was seen by palliative care team who discussed goals of care with family.  Plan is to discharge to home with palliative care services.  01/18/19: Patient was seen and examined at his bedside this morning.  No acute events overnight.  Per bedside RN, oral intake has been adequate and mood is improved on Seroquel.  Bedside RN also noted that patient is calmer in the presence of his wife.  Procalcitonin 0.18 on 01/17/2019.  Completed course of IV antibiotics and of IV steroids.  Vital signs and labs reviewed and are stable.  Patient is hemodynamically stable to discharge to home with palliative care services.  Discussed with Dr. Donzetta Matters via phone on 01/18/2019 regarding restarting Xarelto.  Okay to restart from a vascular standpoint.  Endoleak is not associated with [place].   Hospital Course:  Principal Problem:   AAA (abdominal  aortic aneurysm) ([place]) Active Problems:   IPF (idiopathic pulmonary fibrosis) ([place])    Lower urinary tract infectious disease   Vertebral compression fracture ([place])   Community acquired pneumonia   Pneumonia   Pressure injury of skin   Goals of care, counseling/discussion   Palliative care by specialist  Worsening abdominal aortic aneurysm CT abdomen pelvis showing increased size of the infrarenal abdominal aortic aneurysm sac measuring 7.2 x 5.8 cm with continued probable type II endoleak. Patent aortobiiliac stent graft by Dr. Donzetta Matters on 06/17/2016 Blood pressure is currently at goal Continue amlodipine 10 mg daily and Lopressor 25 mg twice daily Continue blood pressure control  Improving acute metabolic encephalopathy, suspect hospital delirium in the setting of dementia Reorient as needed Responding well to Seroquel per bedside RN Continue Seroquel 25 mg twice daily Per bedside RN patient is a lot calmer with his wife's presence Discharge to home per family's request.  Fall precautions.  Vertebral compression fracture CT showingage indeterminate, new since 2018, slight superior compression deformities of the L2 and L3 vertebral bodies. Pain appears controlled on Tylenol Fall precautions Follow-up with PCP  Resolved acute on chronic hypoxic respiratory failure likely secondary to community-acquired in the setting of idiopathic pulmonary fibrosis, advanced disease On 5 L of oxygen continuously at baseline Currently improving on 4 L and saturating in the upper 90s Continue to maintain O2 saturation greater than 92% Completed course of IV antibiotics and course of IV steroids Resume home prednisone 10 mg daily. Follow-up with pulmonary outpatient  Community-acquired pneumonia Afebrile. White blood cell count 15.8. Lactate normal. On 5 L supplemental oxygen, same as home requirement. CT chest showing advanced interstitial lung disease and fibrosis.  -SARS-CoV-2 test negative Management as stated above Repeated procalcitonin is negative, 0.18 on  01/17/2019.  Resolved uncontrolled hypertension Continue metoprolol tartrate 25 mg twice daily Added Norvasc 10 mg daily which he tolerated well  History of PE -Discussed with Dr. Donzetta Matters, vascular surgeon, via phone on 01/18/2019, okay to restart Xarelto from a vascular standpoint.  [place] not contributing to endoleak per vascular surgery.  Idiopathic pulmonary fibrosis -Continue home supplemental oxygen -Follow-up with pulmonary   Code Status:Full code  Consultants:  PCCM   Palliative care team  Vascular surgery    Discharge Exam: BP 136/77 (BP Location: Left Arm)    Pulse 72    Temp 98 F (36.7 C) (Oral)    Resp 18    Ht 5\' 10"  (1.778 m)    Wt 73 kg    SpO2 100%    BMI 23.09 kg/m   General: 85 y.o. year-old male well developed well nourished in no acute distress.  Alert and interactive in a state of dementia.  Cardiovascular: Regular rate and rhythm with no rubs or gallops.  No thyromegaly or JVD noted.    Respiratory: Clear to auscultation with no wheezes or rales.  Poor inspiratory effort.  Abdomen: Soft nontender nondistended with normal bowel sounds x4 quadrants.  Musculoskeletal: No lower extremity edema. 2/4 pulses in all 4 extremities.  Psychiatry: Mood is appropriate for condition and setting  Discharge Instructions You were cared for by a hospitalist during your hospital stay. If you have any questions about your discharge medications or the care you received while you were in the hospital after you are discharged, you can call the unit and asked to speak with the hospitalist on call if the hospitalist that took care of you is not available. Once you are discharged, your primary care physician will  handle any further medical issues. Please note that NO REFILLS for any discharge medications will be authorized once you are discharged, as it is imperative that you return to your primary care physician (or establish a relationship with a primary care physician if  you do not have one) for your aftercare needs so that they can reassess your need for medications and monitor your lab values.   Allergies as of 01/18/2019      Reactions   Sudafed [pseudoephedrine Hcl]    Causes dizziness and nervousness   Atorvastatin Other (See Comments)   Reaction: Patient's family reports "confusion" and would prefer crestor       Medication List    STOP taking these medications   acetaminophen 500 MG tablet Commonly known as: TYLENOL   cephALEXin 500 MG capsule Commonly known as: KEFLEX   nitrofurantoin (macrocrystal-monohydrate) 100 MG capsule Commonly known as: MACROBID     TAKE these medications   amLODipine 10 MG tablet Commonly known as: NORVASC Take 1 tablet (10 mg total) by mouth daily. Start taking on: January 19, 2019   azelastine 0.1 % nasal spray Commonly known as: ASTELIN Place 2 sprays into both nostrils 2 (two) times daily.   CENTRUM SILVER PO Take 1 tablet by mouth daily.   Cerefolin 08-20-48-5 MG Tabs TAKE 1 TABLET BY MOUTH EVERY DAY   Chlorhexidine Gluconate Cloth 2 % Pads Apply 6 each topically daily for 7 days.   dextromethorphan-guaiFENesin 30-600 MG 12hr tablet Commonly known as: MUCINEX DM Take 1 tablet by mouth 2 (two) times daily.   feeding supplement (ENSURE ENLIVE) Liqd Take 237 mLs by mouth 2 (two) times daily between meals for 7 days.   FISH OIL PO Take 1 capsule by mouth daily.   loratadine 10 MG tablet Commonly known as: CLARITIN Take 10 mg by mouth daily.   metoprolol tartrate 25 MG tablet Commonly known as: LOPRESSOR Take 1 tablet (25 mg total) by mouth 2 (two) times daily.   pantoprazole 40 MG tablet Commonly known as: PROTONIX TAKE 1 TABLET DAILY,30 MINUTES BEFORE DINNER.   predniSONE 10 MG tablet Commonly known as: DELTASONE TAKE 1 TABLET EVERY DAY WITH BREAKFAST.   QUEtiapine 25 MG tablet Commonly known as: SEROQUEL Take 1 tablet (25 mg total) by mouth 2 (two) times daily.   Resveratrol  100 MG Caps Take 200 mg by mouth every evening.   rivaroxaban 10 MG Tabs tablet Commonly known as: Xarelto Take 1 tablet (10 mg total) by mouth daily.   rosuvastatin 5 MG tablet Commonly known as: CRESTOR Take 5 mg by mouth at bedtime.            Durable Medical Equipment  (From admission, onward)         Start     Ordered   01/17/19 0626  For home use only DME 3 n 1  Once     01/17/19 0625         Allergies  Allergen Reactions   Sudafed [Pseudoephedrine Hcl]     Causes dizziness and nervousness   Atorvastatin Other (See Comments)    Reaction: Patient's family reports "confusion" and would prefer crestor    Follow-up Information    Martyn Ehrich, NP. Call in 1 day(s).   Specialty: Pulmonary Disease Why: 10:30 AM: [facility]  Please call for a post hospital follow-up appointment. Contact information: [address] [place] [address] [phone]        Kelton Pillar, MD. Call  in 1 day(s).   Specialty: Family Medicine Why: Please call for a post hospital follow-up appointment. Contact information: [address] [place] [address] [phone]        Waynetta Sandy, MD. Call in 1 day(s).   Specialties: Vascular Surgery, Cardiology Why: Please call for a post hospital follow-up appointment. Contact information: [address] [place] [address] [phone]            The results of significant diagnostics from this hospitalization (including imaging, microbiology, ancillary and laboratory) are listed below for reference.    Significant Diagnostic Studies: Ct Chest Wo Contrast  Result Date: 01/13/2019 CLINICAL DATA:  85 year old male with shortness of breath. EXAM: CT CHEST WITHOUT CONTRAST TECHNIQUE: Multidetector CT imaging of the chest was performed following the standard protocol without IV contrast. COMPARISON:  Chest CT dated 08/17/2018 and radiograph dated 10/20/2018.  FINDINGS: Evaluation of this exam is limited in the absence of intravenous contrast. Cardiovascular: There is no cardiomegaly or pericardial effusion. Multi vessel coronary vascular calcifications noted. There is moderate atherosclerotic calcification of the thoracic aorta. The aorta is mildly tortuous. Mild dilatation of the main pulmonary trunk suggestive of a degree of pulmonary hypertension. Clinical correlation is recommended. Mediastinum/Nodes: No hilar or mediastinal adenopathy. The esophagus is grossly unremarkable. No mediastinal fluid collection. Lungs/Pleura: There is background of interstitial lung disease and fibrosis. Progression of consolidative change primarily involving the lingula since the prior CT of 08/17/2018. Although this may be related to progression of fibrosis, superimposed pneumonia is not excluded. Clinical correlation is recommended. There is no pleural effusion or pneumothorax. The central airways remain patent. Upper Abdomen: No acute abnormality. Musculoskeletal: No chest wall mass or suspicious bone lesions identified. IMPRESSION: Advanced interstitial lung disease and fibrosis. Increased consolidative change in the lingula since the prior CT of 08/17/2018 may be related to progression of fibrosis, although superimposed pneumonia is not excluded. Clinical correlation is recommended. Aortic Atherosclerosis (ICD10-I70.0). Electronically Signed   By: Anner Crete M.D.   On: 01/13/2019 01:35   Ct Abdomen Pelvis W Contrast  Result Date: 01/12/2019 CLINICAL DATA:  Acute abdominal pain severe lower back pain and rectal bleeding, history of pulmonary fibrosis EXAM: CT ABDOMEN AND PELVIS WITH CONTRAST TECHNIQUE: Multidetector CT imaging of the abdomen and pelvis was performed using the standard protocol following bolus administration of intravenous contrast. CONTRAST:  165mL OMNIPAQUE IOHEXOL 300 MG/ML  SOLN COMPARISON:  February 03, 2017 FINDINGS: Lower chest: There is mild  cardiomegaly. There is interval significant worsening in the interstitial thickening and reticulonodular patchy airspace opacities within both lungs. There is new airspace consolidation seen within the anterior left lower lobe/lingula. No pleural effusion is seen. Coronary artery calcifications are seen. No hiatal hernia. Hepatobiliary: The liver is normal in density without focal abnormality.The main portal vein is patent. No evidence of calcified gallstones, gallbladder wall thickening or biliary dilatation. Pancreas: Unremarkable. No pancreatic ductal dilatation or surrounding inflammatory changes. Spleen: Normal in size without focal abnormality. Adrenals/Urinary Tract: Both adrenal glands appear normal. The kidneys and collecting system appear normal without evidence of urinary tract calculus or hydronephrosis. Bladder is unremarkable. Stomach/Bowel: The stomach, small bowel, and colon are normal in appearance. No inflammatory changes, wall thickening, or obstructive findings.There is a moderate amount of colonic stool. No focal area of bowel wall thickening or pneumatosis. Vascular/Lymphatic: There has been interval increased size in the infrarenal abdominal aortic aneurysm sac which now measures 7.2 x 5.8 cm (see 5.1 x 5.9 cm previously) E.  There appears to be hyperdense material seen within the aneurysm sac more notable on the delayed image, consistent with endoleak. The contrast filling of the aneurysm sac more so on the delayed image in the posterior right aspect of the sac, likely due to a type 2 endoleak. There does appear to be patency however of the endograft. Reproductive: Radiation prostate seeds are seen. Other: No evidence of abdominal wall mass or hernia. Musculoskeletal: There is a new since the prior exam superior compression deformity of the L2 and L3 vertebral bodies with less than 25% loss in height. Chronic bilateral pars defects are seen at L5-S1 with a grade 2 anterolisthesis of L5-S1.  IMPRESSION: 1. Increased size of the infrarenal abdominal aortic aneurysm sac measuring 7.2 x 5.8 cm with continued probable type 2 endoleak as there is increased contrast filling of the aneurysm sac posteriorly on delayed images. 2. Patent aorto bi-iliac stent graft. 3. Worsening chronic interstitial lung changes at both lung bases with new airspace patchy consolidation in the anterior left lower lobe/lingula which could be superimposed infection. 4. Age indeterminate, new since 2018, slight superior compression deformities of the L2 and L3 vertebral bodies with less than 25% loss of height. 5. Chronic pars defects at L5-S1 with grade 2 anterolisthesis of L5 on S1. 6. These results were called by telephone at the time of interpretation on 01/12/2019 at 8:16 pm to provider [facility] , who verbally acknowledged these results. Electronically Signed   By: Prudencio Pair M.D.   On: 01/12/2019 20:16   Dg Chest Portable 1 View  Result Date: 01/13/2019 CLINICAL DATA:  Tachycardia EXAM: PORTABLE CHEST 1 VIEW COMPARISON:  October 20, 2018 FINDINGS: There is extensive airspace opacity in the left mid lower lung zones. There are underlying areas of fibrosis throughout the lungs which is stable. There is mild cardiomegaly with pulmonary vascularity normal. No adenopathy. Bones are osteoporotic. IMPRESSION: Airspace opacity consistent with pneumonia in the left mid and lower lung zones, superimposed on chronic fibrotic change. Heart is mildly enlarged, stable. Pulmonary vascularity normal. Bones osteoporotic. Electronically Signed   By: Lowella Grip III M.D.   On: 01/13/2019 07:53   Dg Abd Portable 1v  Result Date: 01/13/2019 CLINICAL DATA:  History of abdominal aortic aneurysm. EXAM: PORTABLE ABDOMEN - 1 VIEW COMPARISON:  01/12/19 FINDINGS: The bowel gas pattern appears normal. Metallic scaffolding for infrarenal abdominal aortic stent graft is again noted. Contrast material from recent enhanced CT of the  abdomen is identified within the bladder. IMPRESSION: 1. Normal bowel gas pattern. 2. Stable appearance of infrarenal abdominal aortic stent graft. Electronically Signed   By: Kerby Moors M.D.   On: 01/13/2019 09:25    Microbiology: Recent Results (from the past 240 hour(s))  Novel Coronavirus, NAA (Hosp order, Send-out to Ref Lab; TAT 18-24 hrs     Status: None   Collection Time: 01/12/19 11:35 PM   Specimen: Nasopharyngeal Swab; Respiratory  Result Value Ref Range Status   SARS-CoV-2, NAA NOT DETECTED NOT DETECTED Final    Comment: (NOTE) This nucleic acid amplification test was developed and its performance characteristics determined by [employer]. Nucleic acid amplification tests include PCR and TMA. This test has not been FDA cleared or approved. This test has been authorized by FDA under an Emergency Use Authorization (EUA). This test is only authorized for the duration of time the declaration that circumstances exist justifying the authorization of the emergency use of in vitro diagnostic tests for detection of SARS-CoV-2 virus and/or diagnosis  of COVID-19 infection under section 564(b)(1) of the Act, 21 U.S.C. [id number]) (1), unless the authorization is terminated or revoked sooner. When diagnostic testing is negative, the possibility of a false negative result should be considered in the context of a patient's recent exposures and the presence of clinical signs and symptoms consistent with COVID-19. An individual without symptoms of COVID- 19 and who is not shedding SARS-CoV-2 vi rus would expect to have a negative (not detected) result in this assay. Performed At: [facility] [address] [place], [place] [id number] Rush Farmer MD [id number]    [place]  Final    Comment: Performed at [facility], [place] [address], [place], [place] [address]  SARS Coronavirus 2 by RT PCR (hospital order, performed in [facility]  hospital lab) Nasopharyngeal Nasopharyngeal Swab     Status: None   Collection Time: 01/13/19  2:19 PM   Specimen: Nasopharyngeal Swab  Result Value Ref Range Status   SARS Coronavirus 2 NEGATIVE NEGATIVE Final    Comment: (NOTE) If result is NEGATIVE SARS-CoV-2 target nucleic acids are NOT DETECTED. The SARS-CoV-2 RNA is generally detectable in upper and lower  respiratory specimens during the acute phase of infection. The lowest  concentration of SARS-CoV-2 viral copies this assay can detect is 250  copies / mL. A negative result does not preclude SARS-CoV-2 infection  and should not be used as the sole basis for treatment or other  patient management decisions.  A negative result may occur with  improper specimen collection / handling, submission of specimen other  than nasopharyngeal swab, presence of viral mutation(s) within the  areas targeted by this assay, and inadequate number of viral copies  (<250 copies / mL). A negative result must be combined with clinical  observations, patient history, and epidemiological information. If result is POSITIVE SARS-CoV-2 target nucleic acids are DETECTED. The SARS-CoV-2 RNA is generally detectable in upper and lower  respiratory specimens dur ing the acute phase of infection.  Positive  results are indicative of active infection with SARS-CoV-2.  Clinical  correlation with patient history and other diagnostic information is  necessary to determine patient infection status.  Positive results do  not rule out bacterial infection or co-infection with other viruses. If result is PRESUMPTIVE POSTIVE SARS-CoV-2 nucleic acids MAY BE PRESENT.   A presumptive positive result was obtained on the submitted specimen  and confirmed on repeat testing.  While 2019 novel coronavirus  (SARS-CoV-2) nucleic acids may be present in the submitted sample  additional confirmatory testing may be necessary for epidemiological  and / or clinical management  purposes  to differentiate between  SARS-CoV-2 and other Sarbecovirus currently known to infect humans.  If clinically indicated additional testing with an alternate test  methodology [phone]) is advised. The SARS-CoV-2 RNA is generally  detectable in upper and lower respiratory sp ecimens during the acute  phase of infection. The expected result is Negative. Fact Sheet for Patients:  StrictlyIdeas.no Fact Sheet for Healthcare Providers: BankingDealers.co.za This test is not yet approved or cleared by the [place] FDA and has been authorized for detection and/or diagnosis of SARS-CoV-2 by FDA under an Emergency Use Authorization (EUA).  This EUA will remain in effect (meaning this test can be used) for the duration of the COVID-19 declaration under Section 564(b)(1) of the Act, 21 U.S.C. section 360bbb-3(b)(1), unless the authorization is terminated or revoked sooner. Performed at [facility], [place] [address], [place], [place] [address]  MRSA PCR Screening     Status: None   Collection Time: 01/13/19  4:43 PM   Specimen: Nasopharyngeal  Result Value Ref Range Status   MRSA by PCR NEGATIVE NEGATIVE Final    Comment:        The GeneXpert MRSA Assay (FDA approved for NASAL specimens only), is one component of a comprehensive MRSA colonization surveillance program. It is not intended to diagnose MRSA infection nor to guide or monitor treatment for MRSA infections. Performed at [facility], [place] [address], [place], [place] [address]   Respiratory Panel by PCR     Status: None   Collection Time: 01/13/19  6:03 PM   Specimen: Nasopharyngeal Swab; Respiratory  Result Value Ref Range Status   Adenovirus NOT DETECTED NOT DETECTED Final   Coronavirus 229E NOT DETECTED NOT DETECTED Final    Comment: (NOTE) The Coronavirus on the Respiratory Panel, DOES NOT test for the novel  Coronavirus (2019 nCoV)    Coronavirus  HKU1 NOT DETECTED NOT DETECTED Final   Coronavirus NL63 NOT DETECTED NOT DETECTED Final   Coronavirus OC43 NOT DETECTED NOT DETECTED Final   Metapneumovirus NOT DETECTED NOT DETECTED Final   Rhinovirus / Enterovirus NOT DETECTED NOT DETECTED Final   Influenza A NOT DETECTED NOT DETECTED Final   Influenza B NOT DETECTED NOT DETECTED Final   Parainfluenza Virus 1 NOT DETECTED NOT DETECTED Final   Parainfluenza Virus 2 NOT DETECTED NOT DETECTED Final   Parainfluenza Virus 3 NOT DETECTED NOT DETECTED Final   Parainfluenza Virus 4 NOT DETECTED NOT DETECTED Final   Respiratory Syncytial Virus NOT DETECTED NOT DETECTED Final   Bordetella pertussis NOT DETECTED NOT DETECTED Final   Chlamydophila pneumoniae NOT DETECTED NOT DETECTED Final   Mycoplasma pneumoniae NOT DETECTED NOT DETECTED Final    Comment: Performed at [facility], [place]. [address] [place], [place] [address]     Labs: Basic Metabolic Panel: Recent Labs  Lab 01/12/19 1801 01/15/19 0631 01/16/19 0710  NA 137 142 141  K 4.6 4.0 3.5  CL 104 111 107  CO2 25 23 24   GLUCOSE 123* 150* 152*  BUN 18 20 18   CREATININE 0.90 0.90 0.90  CALCIUM 8.6* 7.7* 8.0*   Liver Function Tests: Recent Labs  Lab 01/12/19 1801  AST 71*  ALT 93*  ALKPHOS 174*  BILITOT 0.9  PROT 6.8  ALBUMIN 1.9*   No results for input(s): LIPASE, AMYLASE in the last 168 hours. No results for input(s): AMMONIA in the last 168 hours. CBC: Recent Labs  Lab 01/12/19 1801 01/13/19 0237 01/15/19 0631 01/16/19 0710  WBC 15.8* 14.1* 8.6 13.7*  NEUTROABS 14.3*  --   --   --   HGB 9.6* 9.9* 8.8* 9.1*  HCT 30.6* 31.8* 27.9* 28.2*  MCV 93.0 93.8 92.1 89.5  PLT 243 249 185 221   Cardiac Enzymes: No results for input(s): CKTOTAL, CKMB, CKMBINDEX, TROPONINI in the last 168 hours. BNP: BNP (last 3 results) Recent Labs    01/13/19 0237  BNP 214.5*    ProBNP (last 3 results) No results for input(s): PROBNP in the last 8760 hours.  CBG: Recent  Labs  Lab 01/17/19 0719 01/17/19 1120 01/17/19 1623 01/17/19 1944 01/18/19 0005  GLUCAP 160* 242* 265* 194* 140*       Signed:  Kayleen Memos, MD [facility] 01/18/2019, 10:47 AM

## 2019-01-18 NOTE — Discharge Instructions (Signed)
Abdominal Aortic Aneurysm  An aneurysm is a bulge in one of the blood vessels that carry blood away from the heart (artery). It happens when blood pushes up against a weak or damaged place in the wall of an artery. An abdominal aortic aneurysm happens in the main artery of the body (aorta). Some aneurysms may not cause problems. If it grows, it can burst or tear, causing bleeding inside the body. This is an emergency. It needs to be treated right away. What are the causes? The exact cause of this condition is not known. What increases the risk? The following may make you more likely to get this condition:  Being a male who is 15 years of age or older.  Being white (Caucasian).  Using tobacco.  Having a family history of aneurysms.  Having the following conditions: ? Hardening of the arteries (arteriosclerosis). ? Inflammation of the walls of an artery (arteritis). ? Certain genetic conditions. ? Being very overweight (obesity). ? An infection in the wall of the aorta (infectious aortitis). ? High cholesterol. ? High blood pressure (hypertension). What are the signs or symptoms? Symptoms depend on the size of the aneurysm and how fast it is growing. Most grow slowly and do not cause any symptoms. If symptoms do occur, they may include:  Pain in the belly (abdomen), side, or back.  Feeling full after eating only small amounts of food.  Feeling a throbbing lump in the belly. Symptoms that the aneurysm has burst (ruptured) include:  Sudden, very bad pain in the belly, side, or back.  Feeling sick to your stomach (nauseous).  Throwing up (vomiting).  Feeling light-headed or passing out. How is this treated? Treatment for this condition depends on:  The size of the aneurysm.  How fast it is growing.  Your age.  Your risk of having it burst. If your aneurysm is smaller than 2 inches (5 cm), your doctor may manage it by:  Checking it often to see if it is getting bigger.  You may have an imaging test (ultrasound) to check it every 3-6 months, every year, or every few years.  Giving you medicines to: ? Control blood pressure. ? Treat pain. ? Fight infection. If your aneurysm is larger than 2 inches (5 cm), you may need surgery to fix it. Follow these instructions at home: Lifestyle  Do not use any products that have nicotine or tobacco in them. This includes cigarettes, e-cigarettes, and chewing tobacco. If you need help quitting, ask your doctor.  Get regular exercise. Ask your doctor what types of exercise are best for you. Eating and drinking  Eat a heart-healthy diet. This includes eating plenty of: ? Fresh fruits and vegetables. ? Whole grains. ? Low-fat (lean) protein. ? Low-fat dairy products.  Avoid foods that are high in saturated fat and cholesterol. These foods include red meat and some dairy products.  Do not drink alcohol if: ? Your doctor tells you not to drink. ? You are pregnant, may be pregnant, or are planning to become pregnant.  If you drink alcohol: ? Limit how much you use to:  0-1 drink a day for women.  0-2 drinks a day for men. ? Be aware of how much alcohol is in your drink. In the [place] one drink equals any of these:  One typical bottle of beer (12 oz).  One-half glass of wine (5 oz).  One shot of hard liquor (1 oz). General instructions  Take over-the-counter and prescription medicines only as  told by your doctor.  Keep your blood pressure within normal limits. Ask your doctor what your blood pressure should be.  Have your blood sugar (glucose) level and cholesterol levels checked regularly. Keep your blood sugar level and cholesterol levels within normal limits.  Avoid heavy lifting and activities that take a lot of effort. Ask your doctor what activities are safe for you.  Keep all follow-up visits as told by your doctor. This is important. ? Talk to your doctor about regular screenings to see if the  aneurysm is getting bigger. Contact a doctor if you:  Have pain in your belly, side, or back.  Have a throbbing feeling in your belly.  Have a family history of aneurysms. Get help right away if you:  Have sudden, bad pain in your belly, side, or back.  Feel sick to your stomach.  Throw up.  Have trouble pooping (constipation).  Have trouble peeing (urinating).  Feel light-headed.  Have a fast heart rate when you stand.  Have sweaty skin that is cold to the touch (clammy).  Have shortness of breath.  Have a fever. These symptoms may be an emergency. Do not wait to see if the symptoms will go away. Get medical help right away. Call your local emergency services (911 in the [place] Do not drive yourself to the hospital. Summary  An aneurysm is a bulge in one of the blood vessels that carry blood away from the heart (artery). Some aneurysms may not cause problems.  You may need to have yours checked often. If it grows, it can burst or tear. This causes bleeding inside the body. It needs to be treated right away.  Follow instructions from your doctor about healthy lifestyle changes.  Keep all follow-up visits as told by your doctor. This is important. This information is not intended to replace advice given to you by your health care provider. Make sure you discuss any questions you have with your health care provider. Document Released: 07/03/2012 Document Revised: 06/26/2018 Document Reviewed: 10/15/2017 Elsevier Patient Education  [place] Pneumonia, Adult Pneumonia is an infection of the lungs. It causes swelling in the airways of the lungs. Mucus and fluid may also build up inside the airways. One type of pneumonia can happen while a person is in a hospital. A different type can happen when a person is not in a hospital (community-acquired pneumonia).  What are the causes?  This condition is caused by germs (viruses, bacteria, or  fungi). Some types of germs can be passed from one person to another. This can happen when you breathe in droplets from the cough or sneeze of an infected person. What increases the risk? You are more likely to develop this condition if you:  Have a long-term (chronic) disease, such as: ? Chronic obstructive pulmonary disease (COPD). ? Asthma. ? Cystic fibrosis. ? Congestive heart failure. ? Diabetes. ? Kidney disease.  Have HIV.  Have sickle cell disease.  Have had your spleen removed.  Do not take good care of your teeth and mouth (poor dental hygiene).  Have a medical condition that increases the risk of breathing in droplets from your own mouth and nose.  Have a weakened body defense system (immune system).  Are a smoker.  Travel to areas where the germs that cause this illness are common.  Are around certain animals or the places they live. What are the signs or symptoms?  A dry cough.  A wet (productive) cough.  Fever.  Sweating.  Chest pain. This often happens when breathing deeply or coughing.  Fast breathing or trouble breathing.  Shortness of breath.  Shaking chills.  Feeling tired (fatigue).  Muscle aches. How is this treated? Treatment for this condition depends on many things. Most adults can be treated at home. In some cases, treatment must happen in a hospital. Treatment may include:  Medicines given by mouth or through an IV tube.  Being given extra oxygen.  Respiratory therapy. In rare cases, treatment for very bad pneumonia may include:  Using a machine to help you breathe.  Having a procedure to remove fluid from around your lungs. Follow these instructions at home: Medicines  Take over-the-counter and prescription medicines only as told by your doctor. ? Only take cough medicine if you are losing sleep.  If you were prescribed an antibiotic medicine, take it as told by your doctor. Do not stop taking the antibiotic even if you  start to feel better. General instructions   Sleep with your head and neck raised (elevated). You can do this by sleeping in a recliner or by putting a few pillows under your head.  Rest as needed. Get at least 8 hours of sleep each night.  Drink enough water to keep your pee (urine) pale yellow.  Eat a healthy diet that includes plenty of vegetables, fruits, whole grains, low-fat dairy products, and lean protein.  Do not use any products that contain nicotine or tobacco. These include cigarettes, e-cigarettes, and chewing tobacco. If you need help quitting, ask your doctor.  Keep all follow-up visits as told by your doctor. This is important. How is this prevented? A shot (vaccine) can help prevent pneumonia. Shots are often suggested for:  People older than 83 years of age.  People older than 83 years of age who: ? Are having cancer treatment. ? Have long-term (chronic) lung disease. ? Have problems with their body's defense system. You may also prevent pneumonia if you take these actions:  Get the flu (influenza) shot every year.  Go to the dentist as often as told.  Wash your hands often. If you cannot use soap and water, use hand sanitizer. Contact a doctor if:  You have a fever.  You lose sleep because your cough medicine does not help. Get help right away if:  You are short of breath and it gets worse.  You have more chest pain.  Your sickness gets worse. This is very serious if: ? You are an older adult. ? Your body's defense system is weak.  You cough up blood. Summary  Pneumonia is an infection of the lungs.  Most adults can be treated at home. Some will need treatment in a hospital.  Drink enough water to keep your pee pale yellow.  Get at least 8 hours of sleep each night. This information is not intended to replace advice given to you by your health care provider. Make sure you discuss any questions you have with your health care  provider. Document Released: 08/25/2007 Document Revised: 06/28/2018 Document Reviewed: 11/03/2017 Elsevier Patient Education  2020 [employer].

## 2019-01-19 ENCOUNTER — Telehealth: Payer: Self-pay

## 2019-01-19 ENCOUNTER — Telehealth: Payer: Self-pay | Admitting: Internal Medicine

## 2019-01-19 NOTE — Telephone Encounter (Signed)
Spoke with wife Leoma regarding Palliative services and she was in agreement with this.  I have scheduled a Telehealth Zoom Consult for 01/23/19 @ 1 PM.

## 2019-01-19 NOTE — Telephone Encounter (Signed)
I called and spoke with patient's wife and they were scheduled by someone in the hospital with out NP's. I asked if she would prefer seeing Dr. Vaughan Browner if they same day was available and she was agreeable. She states that she would want him to be seen in person if he was strong enough. I made them a visit for 01/31/19. I advised her to call if he is not able to come in and we will change him to a virtual visit. Nothing further is needed.

## 2019-01-19 NOTE — Telephone Encounter (Signed)
-----   Message from Marshell Garfinkel, MD sent at 01/17/2019  3:59 PM EDT ----- Regarding: Follow up Patient is being discharged from the hospital tomorrowCan you please call later this week to make follow-up televisit appointment with me in 2 weeks

## 2019-01-23 ENCOUNTER — Other Ambulatory Visit: Payer: Self-pay

## 2019-01-23 ENCOUNTER — Other Ambulatory Visit: Payer: Medicare Other | Admitting: Internal Medicine

## 2019-01-23 DIAGNOSIS — Z515 Encounter for palliative care: Secondary | ICD-10-CM

## 2019-01-23 NOTE — Progress Notes (Unsigned)
[facility] Consult Note Telephone: [phone]  Fax: [phone]  PATIENT NAME: [name] [name] DOB: [birth year] MRN: [id number]  PRIMARY CARE PROVIDER:   Kelton Pillar, MD  REFERRING PROVIDER:  Kelton Pillar, MD [address] [employer] [place],  [place] [address]  RESPONSIBLE PARTY:   Wife      RECOMMENDATIONS and PLAN:  Palliative Care Encounter Z51.5  1.  Advance care planning:    2.  Shortness of breath:  Resolving pneumonia and worsening of pulmonary fibrosis.    3.  Memory with behaviors:  Related to dementia.  FAST stage   Due to the COVID-19 crisis,  This visit occurred via telehealth and was initiated and consented by patient and or family.  I spent *** minutes providing this consultation,  from *** to ***. More than 50% of the time in this consultation was spent coordinating communication.   HISTORY OF PRESENT ILLNESS:  [name] [name] is a 85 y.o. year old male with multiple medical problems including worsening of pulmonary fibrosis and increase in sized of a AAA.  He has dementia with behaviors.  He was admitted to the hospital from 10/23-10/29/20 due to sepsis related to pneumonia and a UTI.  Palliative Care was asked to help address goals of care.   CODE STATUS: DNAR  PPS: 0% HOSPICE ELIGIBILITY/DIAGNOSIS: TBD  PAST MEDICAL HISTORY:  Past Medical History:  Diagnosis Date  . AAA (abdominal aortic aneurysm) ([place])   . Allergy   . Carotid artery stenosis   . Cognitive decline   . Diplopia   . GERD (gastroesophageal reflux disease)   . Hyperlipidemia   . Memory loss   . Prostate cancer ([place])   . Pulmonary fibrosis ([place])   . Spondylisthesis 2015   Spine  . Stroke ([place]) 04/18/13   some weakness left-returning strength  . TIA (transient ischemic attack)      ALLERGIES:  Allergies  Allergen Reactions  . Sudafed [Pseudoephedrine Hcl]     Causes dizziness and nervousness  . Atorvastatin  Other (See Comments)    Reaction: Patient's family reports "confusion" and would prefer crestor      PERTINENT MEDICATIONS:  Outpatient Encounter Medications as of 01/23/2019  Medication Sig  . amLODipine (NORVASC) 10 MG tablet Take 1 tablet (10 mg total) by mouth daily.  Marland Kitchen azelastine (ASTELIN) 0.1 % nasal spray Place 2 sprays into both nostrils 2 (two) times daily.   . Chlorhexidine Gluconate Cloth 2 % PADS Apply 6 each topically daily for 7 days.  Marland Kitchen dextromethorphan-guaiFENesin (MUCINEX DM) 30-600 MG 12hr tablet Take 1 tablet by mouth 2 (two) times daily.   . feeding supplement, ENSURE ENLIVE, (ENSURE ENLIVE) LIQD Take 237 mLs by mouth 2 (two) times daily between meals for 7 days.  Marland Kitchen L-Methylfolate-B12-B6-B2 (CEREFOLIN) 08-20-48-5 MG TABS TAKE 1 TABLET BY MOUTH EVERY DAY  . loratadine (CLARITIN) 10 MG tablet Take 10 mg by mouth daily.  . metoprolol tartrate (LOPRESSOR) 25 MG tablet Take 1 tablet (25 mg total) by mouth 2 (two) times daily.  . Multiple Vitamins-Minerals (CENTRUM SILVER PO) Take 1 tablet by mouth daily.   . Omega-3 Fatty Acids (FISH OIL PO) Take 1 capsule by mouth daily.  . pantoprazole (PROTONIX) 40 MG tablet TAKE 1 TABLET DAILY,30 MINUTES BEFORE DINNER.  Marland Kitchen predniSONE (DELTASONE) 10 MG tablet TAKE 1 TABLET EVERY DAY WITH BREAKFAST.  . QUEtiapine (SEROQUEL) 25 MG tablet Take 1 tablet (25 mg total) by mouth 2 (two) times  daily.  . Resveratrol 100 MG CAPS Take 200 mg by mouth every evening.   . rivaroxaban (XARELTO) 10 MG TABS tablet Take 1 tablet (10 mg total) by mouth daily.  . rosuvastatin (CRESTOR) 5 MG tablet Take 5 mg by mouth at bedtime.   Facility-Administered Encounter Medications as of 01/23/2019  Medication  . aspirin EC tablet 81 mg    PHYSICAL EXAM:   General: NAD, frail appearing, thin Cardiovascular: regular rate and rhythm Pulmonary: clear ant fields Abdomen: soft, nontender, + bowel sounds GU: no suprapubic tenderness Extremities: no edema, no joint  deformities Skin: no rashes Neurological: Weakness but otherwise nonfocal  Gonzella Lex, NP

## 2019-01-29 ENCOUNTER — Emergency Department (HOSPITAL_COMMUNITY)
Admission: EM | Admit: 2019-01-29 | Discharge: 2019-01-29 | Disposition: A | Discharge status: Home / Self Care | Payer: Medicare Other | Attending: Emergency Medicine | Admitting: Emergency Medicine

## 2019-01-29 ENCOUNTER — Emergency Department (HOSPITAL_COMMUNITY): Payer: Medicare Other

## 2019-01-29 ENCOUNTER — Encounter (HOSPITAL_COMMUNITY): Payer: Self-pay

## 2019-01-29 ENCOUNTER — Other Ambulatory Visit: Payer: Self-pay

## 2019-01-29 DIAGNOSIS — I714 Abdominal aortic aneurysm, without rupture, unspecified: Secondary | ICD-10-CM | POA: Diagnosis present

## 2019-01-29 DIAGNOSIS — Z79899 Other long term (current) drug therapy: Secondary | ICD-10-CM | POA: Insufficient documentation

## 2019-01-29 DIAGNOSIS — C61 Malignant neoplasm of prostate: Secondary | ICD-10-CM | POA: Insufficient documentation

## 2019-01-29 DIAGNOSIS — J984 Other disorders of lung: Secondary | ICD-10-CM | POA: Diagnosis not present

## 2019-01-29 DIAGNOSIS — Z87891 Personal history of nicotine dependence: Secondary | ICD-10-CM | POA: Insufficient documentation

## 2019-01-29 DIAGNOSIS — R404 Transient alteration of awareness: Secondary | ICD-10-CM | POA: Diagnosis not present

## 2019-01-29 DIAGNOSIS — R0681 Apnea, not elsewhere classified: Secondary | ICD-10-CM | POA: Diagnosis not present

## 2019-01-29 DIAGNOSIS — Z7189 Other specified counseling: Secondary | ICD-10-CM | POA: Diagnosis not present

## 2019-01-29 DIAGNOSIS — R55 Syncope and collapse: Secondary | ICD-10-CM | POA: Insufficient documentation

## 2019-01-29 DIAGNOSIS — J84112 Idiopathic pulmonary fibrosis: Secondary | ICD-10-CM | POA: Diagnosis not present

## 2019-01-29 DIAGNOSIS — R4189 Other symptoms and signs involving cognitive functions and awareness: Secondary | ICD-10-CM | POA: Diagnosis present

## 2019-01-29 DIAGNOSIS — Z8673 Personal history of transient ischemic attack (TIA), and cerebral infarction without residual deficits: Secondary | ICD-10-CM | POA: Insufficient documentation

## 2019-01-29 DIAGNOSIS — Z7901 Long term (current) use of anticoagulants: Secondary | ICD-10-CM | POA: Insufficient documentation

## 2019-01-29 DIAGNOSIS — J9611 Chronic respiratory failure with hypoxia: Secondary | ICD-10-CM

## 2019-01-29 DIAGNOSIS — I259 Chronic ischemic heart disease, unspecified: Secondary | ICD-10-CM | POA: Insufficient documentation

## 2019-01-29 DIAGNOSIS — M5489 Other dorsalgia: Secondary | ICD-10-CM | POA: Diagnosis not present

## 2019-01-29 DIAGNOSIS — Z20828 Contact with and (suspected) exposure to other viral communicable diseases: Secondary | ICD-10-CM | POA: Diagnosis not present

## 2019-01-29 DIAGNOSIS — R52 Pain, unspecified: Secondary | ICD-10-CM | POA: Diagnosis not present

## 2019-01-29 LAB — CBG MONITORING, ED: Glucose-Capillary: 131 mg/dL — ABNORMAL HIGH (ref 70–99)

## 2019-01-29 LAB — COMPREHENSIVE METABOLIC PANEL
ALT: 48 U/L — ABNORMAL HIGH (ref 0–44)
AST: 50 U/L — ABNORMAL HIGH (ref 15–41)
Albumin: 2.4 g/dL — ABNORMAL LOW (ref 3.5–5.0)
Alkaline Phosphatase: 152 U/L — ABNORMAL HIGH (ref 38–126)
Anion gap: 10 (ref 5–15)
BUN: 17 mg/dL (ref 8–23)
CO2: 27 mmol/L (ref 22–32)
Calcium: 8.1 mg/dL — ABNORMAL LOW (ref 8.9–10.3)
Chloride: 105 mmol/L (ref 98–111)
Creatinine, Ser: 0.94 mg/dL (ref 0.61–1.24)
GFR calc Af Amer: >60 mL/min (ref >60)
GFR calc non Af Amer: >60 mL/min (ref >60)
Glucose, Bld: 135 mg/dL — ABNORMAL HIGH (ref 70–99)
Potassium: 4.4 mmol/L (ref 3.5–5.1)
Sodium: 142 mmol/L (ref 135–145)
Total Bilirubin: 1.2 mg/dL (ref 0.3–1.2)
Total Protein: 6.2 g/dL — ABNORMAL LOW (ref 6.5–8.1)

## 2019-01-29 LAB — TYPE AND SCREEN
ABO/RH(D): O POS
Antibody Screen: NEGATIVE

## 2019-01-29 LAB — CBC
HCT: 35.1 % — ABNORMAL LOW (ref 39.0–52.0)
Hemoglobin: 10.8 g/dL — ABNORMAL LOW (ref 13.0–17.0)
MCH: 29.7 pg (ref 26.0–34.0)
MCHC: 30.8 g/dL (ref 30.0–36.0)
MCV: 96.4 fL (ref 80.0–100.0)
Platelets: 128 10*3/uL — ABNORMAL LOW (ref 150–400)
RBC: 3.64 MIL/uL — ABNORMAL LOW (ref 4.22–5.81)
RDW: 17.1 % — ABNORMAL HIGH (ref 11.5–15.5)
WBC: 15.2 10*3/uL — ABNORMAL HIGH (ref 4.0–10.5)
nRBC: 0 % (ref 0.0–0.2)

## 2019-01-29 MED ORDER — AZITHROMYCIN 250 MG PO TABS: 250.0000 mg | ORAL_TABLET | Freq: Every day | ORAL | 0 refills | Status: AC

## 2019-01-29 MED ORDER — SODIUM CHLORIDE (PF) 0.9 % IJ SOLN
INTRAMUSCULAR | Status: AC
  Filled 2019-01-29: qty 100

## 2019-01-29 MED ORDER — SODIUM CHLORIDE 0.9% FLUSH
3.0000 mL | Freq: Once | INTRAVENOUS | Status: AC
  Administered 2019-01-29: 3 mL via INTRAVENOUS

## 2019-01-29 MED ORDER — IOHEXOL 350 MG/ML SOLN
100.0000 mL | Freq: Once | INTRAVENOUS | Status: AC | PRN
  Administered 2019-01-29: 19:00:00 100 mL via INTRAVENOUS

## 2019-01-29 NOTE — Consult Note (Signed)
ER Consult Note   [name] [name] [id number] DOB: Mar 02, 1930 DOA: 01/29/2019  PCP: Kelton Pillar, MD Consultants:  [facility] - pulmonology; palliative care; [facility] - cardiology; Leonie Man - neurology; [facility] - vascular Patient coming from:  Home - lives with wife; NOK: Wife, Marliss Czar, [phone]  Chief Complaint: SOB  HPI: [name] [name] is a 85 y.o. male with medical history significant of CVA (2015); pulmonary fibrosis; prostate CA; dementia; HLD; and AAA presenting with SOB.  His last hospitalization "really set him back."  Before hospital, he was using a rollator; since coming home, he is barely able to stand.  She thought that was beginning to clear up after coming off the 2 new BP medications.  2 days after getting home, he had some mild throat congestion without fever, but he did have laryngitis.  That resolved yesterday.  Today, he had some shakes - this happens intermittently and he is unable to do anything when this happens.  He is usually on 4L and she turned it up to 5L.  His wife was concerned about whether the PNA was still there.  She was concerned that he was going to lose consciousness.  His eyes were fluttering, his head was turning to the left, and he wasn't able to speak.  She thinks the time has come for her to start to decide against ER visits and she could not decide today.  His current cough is "throatier and heavier than his usual cough."   He was admitted from 10/23-29 for worsening AAA and persistent encephalopathy with sepsis due to PNA.   ED Course:  ?syncope at home today.  Here, appeared normal and comfortable.  Transient hypotension with EMS, intermittently "out of it" but here ok.  Wife reports unresponsive for a short period of time.  Wife is concerned that she won't have aides at home overnight.  Review of Systems: As per HPI; otherwise review of systems reviewed and negative.     Past Medical History:  Diagnosis Date  . AAA (abdominal aortic  aneurysm) ([place])   . Allergy   . Carotid artery stenosis   . Cognitive decline   . Diplopia   . GERD (gastroesophageal reflux disease)   . Hyperlipidemia   . Memory loss   . Prostate cancer ([place])   . Pulmonary fibrosis ([place])   . Spondylisthesis 2015   Spine  . Stroke ([place]) 04/18/13   some weakness left-returning strength  . TIA (transient ischemic attack)     Past Surgical History:  Procedure Laterality Date  . ABDOMINAL AORTIC ENDOVASCULAR STENT GRAFT N/A 06/17/2016   Procedure: ABDOMINAL AORTIC ENDOVASCULAR STENT GRAFT;  Surgeon: Waynetta Sandy, MD;  Location[facility];  Service: Vascular;  Laterality: N/A;  . CAROTID ENDARTERECTOMY  Sept. 2008   Elective left carotid endarterectomy  . CAROTID ENDARTERECTOMY Right 04/27/2013   CE  . ENDARTERECTOMY Right 04/27/2013   Procedure: ENDARTERECTOMY CAROTID;  Surgeon: Mal Misty, MD;  Location: [place];  Service: Vascular;  Laterality: Right;  . EYE SURGERY     Bilateral cataract   . FOOT SURGERY      left Achilles Tendon Repair  . seed implantation  2001   for prostate ca    Social History   Socioeconomic History  . Marital status: Married    Spouse name: lee  . Number of children: 2  . Years of education: college  . Highest education level: Not on file  Occupational History  . Occupation: retired  Employer: RETIRED    Comment: [employer]  . Financial resource strain: Not on file  . Food insecurity    Worry: Not on file    Inability: Not on file  . Transportation needs    Medical: Not on file    Non-medical: Not on file  Tobacco Use  . Smoking status: Former Smoker    Packs/day: 1.00    Years: 14.00    Pack years: 14.00    Types: Cigarettes    Quit date: 03/27/1965    Years since quitting: 53.8  . Smokeless tobacco: Never Used  Substance and Sexual Activity  . Alcohol use: Yes    Alcohol/week: 1.0 standard drinks    Types: 1 Shots of liquor per week    Comment: 3-4 drinks week  bourbon  . Drug use: No  . Sexual activity: Not Currently  Lifestyle  . Physical activity    Days per week: Not on file    Minutes per session: Not on file  . Stress: Not on file  Relationships  . Social Herbalist on phone: Not on file    Gets together: Not on file    Attends religious service: Not on file    Active member of club or organization: Not on file    Attends meetings of clubs or organizations: Not on file    Relationship status: Not on file  . Intimate partner violence    Fear of current or ex partner: Not on file    Emotionally abused: Not on file    Physically abused: Not on file    Forced sexual activity: Not on file  Other Topics Concern  . Not on file  Social History Narrative  . Not on file    Allergies  Allergen Reactions  . Sudafed [Pseudoephedrine Hcl]     Causes dizziness and nervousness  . Atorvastatin Other (See Comments)    Reaction: Patient's family reports "confusion" and would prefer crestor     Family History  Problem Relation Age of Onset  . Stroke Mother   . Heart disease Mother        After age 33  . Varicose Veins Mother   . Hypertension Mother   . Cancer Mother        Theadora Rama   . Stroke Father   . Heart attack Father   . Deep vein thrombosis Father   . Anuerysm Father   . Heart disease Brother     Prior to Admission medications   Medication Sig Start Date End Date Taking? Authorizing Provider  azelastine (ASTELIN) 0.1 % nasal spray Place 2 sprays into both nostrils 2 (two) times daily.  01/08/15  Yes [provider]  dextromethorphan-guaiFENesin (MUCINEX DM) 30-600 MG 12hr tablet Take 1 tablet by mouth 2 (two) times daily.    Yes [provider]  feeding supplement, ENSURE ENLIVE, (ENSURE ENLIVE) LIQD Take 237 mLs by mouth 2 (two) times daily between meals.   Yes [provider]  L-Methylfolate-B12-B6-B2 (CEREFOLIN) 08-20-48-5 MG TABS TAKE 1 TABLET BY MOUTH EVERY DAY Patient taking  differently: Take 1 tablet by mouth daily.  03/30/18  Yes Dennie Bible, NP  loratadine (CLARITIN) 10 MG tablet Take 10 mg by mouth daily.   Yes [provider]  Multiple Vitamins-Minerals (CENTRUM SILVER PO) Take 1 tablet by mouth daily.    Yes [provider]  Omega-3 Fatty Acids (FISH OIL PO) Take 1 capsule by  mouth daily.   Yes [provider]  pantoprazole (PROTONIX) 40 MG tablet TAKE 1 TABLET DAILY,30 MINUTES BEFORE DINNER. Patient taking differently: Take 40 mg by mouth daily. 30 minutes before supper 12/15/18  Yes McQuaid, Ronie Spies, MD  predniSONE (DELTASONE) 10 MG tablet TAKE 1 TABLET EVERY DAY WITH BREAKFAST. Patient taking differently: Take 10 mg by mouth daily with breakfast.  10/23/18  Yes Juanito Doom, MD  Resveratrol 100 MG CAPS Take 200 mg by mouth every evening.    Yes [provider]  rivaroxaban (XARELTO) 10 MG TABS tablet Take 1 tablet (10 mg total) by mouth daily. 01/04/19  Yes Mannam, Praveen, MD  rosuvastatin (CRESTOR) 5 MG tablet Take 5 mg by mouth at bedtime.   Yes [provider]  amLODipine (NORVASC) 10 MG tablet Take 1 tablet (10 mg total) by mouth daily. 01/19/19   Kayleen Memos, DO  metoprolol tartrate (LOPRESSOR) 25 MG tablet Take 1 tablet (25 mg total) by mouth 2 (two) times daily. 01/18/19   Kayleen Memos, DO  QUEtiapine (SEROQUEL) 25 MG tablet Take 1 tablet (25 mg total) by mouth 2 (two) times daily. 01/18/19   Kayleen Memos, DO    Physical Exam: Vitals:   01/29/19 2100 01/29/19 2130 01/29/19 2200 01/29/19 2226  BP: (!) 123/94 126/78 131/78   Pulse: 89 85 88   Resp: 20 15 15    Temp:    98.7 F (37.1 C)  SpO2: 100% 100% 100%      . General:  Appears calm and comfortable and is NAD, mildly somnolent . Eyes:  PERRL, EOMI, normal lids, iris . ENT:  grossly normal hearing, lips & tongue, mmm; appropriate dentition . Neck:  no LAD, masses or thyromegaly; no carotid bruits . Cardiovascular:  RRR, no  m/r/g. No LE edema.  Marland Kitchen Respiratory:   Diffuse rhonchi.  Normal to mildly increased respiratory effort. . Abdomen:  soft, NT, ND, NABS . Skin:  no rash or induration seen on limited exam . Musculoskeletal:  grossly normal tone BUE/BLE, good ROM, no bony abnormality . Psychiatric:  Blunted mood and affect, speech sparse but appropriate Neurologic:  Unable to perform   Radiological Exams on Admission: Dg Chest Port 1 View  Result Date: 01/29/2019 CLINICAL DATA:  Syncope EXAM: PORTABLE CHEST 1 VIEW COMPARISON:  January 13, 2019 FINDINGS: There is a persistent airspace opacity involving much of the left mid and left lower lung zone. There are scattered airspace opacities throughout the right mid and right lower lung zones. The heart size remains enlarged. There is no pneumothorax. No large pleural effusion. IMPRESSION: 1. Persistent findings of interstitial lung disease with relatively unchanged consolidation involving much of the left lung field. 2. Unchanged cardiomegaly. Electronically Signed   By: Constance Holster M.D.   On: 01/29/2019 18:15   Ct Angio Chest/abd/pel For Dissection W And/or Wo Contrast  Result Date: 01/29/2019 CLINICAL DATA:  Recent syncopal episode, history of abdominal aortic aneurysm with known endoleak EXAM: CT ANGIOGRAPHY CHEST, ABDOMEN AND PELVIS TECHNIQUE: Multidetector CT imaging through the chest, abdomen and pelvis was performed using the standard protocol during bolus administration of intravenous contrast. Multiplanar reconstructed images and MIPs were obtained and reviewed to evaluate the vascular anatomy. CONTRAST:  151mL OMNIPAQUE IOHEXOL 350 MG/ML SOLN COMPARISON:  01/12/2019, 01/13/2019 FINDINGS: CTA CHEST FINDINGS Cardiovascular: Initial precontrast images demonstrate diffuse atherosclerotic calcification of the thoracic aorta. No hyperdense crescent is noted to suggest intramural hematoma. No aneurysmal dilatation of the ascending aorta is  noted. Coronary  calcifications are seen. No significant cardiac enlargement is noted. The pulmonary artery as visualized shows no definitive filling defects to suggest pulmonary emboli. Mediastinum/Nodes: Thoracic inlet is within normal limits. Scattered small mediastinal lymph nodes are identified stable from the recent exam. The esophagus as visualized is within normal limits. Lungs/Pleura: Stable interstitial changes and fibrotic change is noted. Persistent consolidative changes are seen within the left upper lobe and lingula. Changes of bronchiectasis in the left upper lobe are again seen predominately in the region of the lingula. Otherwise stable fibrotic changes are noted in the bases left greater than right. No new focal infiltrate or sizable parenchymal nodule is seen. Musculoskeletal: Degenerative changes of the thoracic spine are noted. No acute rib abnormality is seen. No compression deformity is noted. Review of the MIP images confirms the above findings. CTA ABDOMEN AND PELVIS FINDINGS VASCULAR Aorta: Abdominal aorta demonstrates atherosclerotic calcifications. There are again changes consistent with stent graft placement. The stent graft is patent with runoff into the iliac arteries bilaterally. The aneurysm sac again measures 7.4 x 5.5 cm in greatest transverse and AP dimensions respectively. This is roughly similar to that seen on the prior exam given some slight variation in the measurement technique. Persistent likely type 2 endoleak is again seen and stable the degree of early arterial filling within the aneurysm sac is less than that seen on the prior exam. No evidence of impending rupture is identified. Celiac: Atherosclerotic plaque is noted at the origin of the celiac axis without focal hemodynamically significant stenosis. SMA: Plaque is noted at the origin of the SMA without focal significant stenosis. Renals: The main renal arteries are identified bilaterally. The accessory right renal artery seen  previously is not well appreciated on today's exam. Overall decreased enhancement of the lower pole of the right kidney is again seen due to the lack of significant flow in the accessory renal artery. IMA: IMA is visualized although proximally at its origin from the aneurysm sac it is not opacified and likely fed by collaterals from the superior mesenteric artery. Iliacs: Iliac arteries are widely patent with mild atherosclerotic disease. Veins: No specific venous abnormality is noted. Review of the MIP images confirms the above findings. NON-VASCULAR Hepatobiliary: Gallbladder is decompressed. The liver is within normal limits. Pancreas: Unremarkable. No pancreatic ductal dilatation or surrounding inflammatory changes. Spleen: Normal in size without focal abnormality. Adrenals/Urinary Tract: Adrenal glands are within normal limits. The kidneys demonstrate a normal enhancement pattern bilaterally with the exception of decreased perfusion in the lower pole of the right kidney. No obstructive changes are seen. The bladder is partially decompressed. Stomach/Bowel: Scattered diverticular change of the colon is noted without evidence of diverticulitis. The appendix is not well visualized. No small bowel or gastric abnormality is seen. Lymphatic: No significant lymphadenopathy is noted. Reproductive: Prostate demonstrates multiple radiotherapy seeds. Other: No abdominal wall hernia or abnormality. No abdominopelvic ascites. Musculoskeletal: Bilateral L5 pars defects are noted with grade 2 anterolisthesis of L5 on S1. Compression deformities at L1 and L2 are again seen and relatively stable in appearance. Review of the MIP images confirms the above findings. IMPRESSION: CTA of the chest: No significant aneurysmal dilatation of the thoracic aorta. Atherosclerotic calcifications are noted. No findings of dissection are noted. Chronic fibrotic changes with areas of consolidation within the lingula and increasing  bronchiectasis in the lingula. No new focal abnormality is seen. CTA of the abdomen and pelvis: Abdominal aortic aneurysm with relatively stable size of the aneurysm sac when  compared with the most recent exam given some variation in the measuring technique. Persistent likely type 2 endoleak is noted although the degree of enhancement is slightly decreased when compared with the prior exam. No findings to suggest impending rupture are seen. Patent stent graft within the aorta Chronic changes in the abdomen and pelvis similar to that seen on the prior exam without acute abnormality. Electronically Signed   By: Inez Catalina M.D.   On: 01/29/2019 19:46    EKG: Independently reviewed.  Sinus tachycardia with rate 108; nonspecific ST changes with no evidence of acute ischemia   Labs on Admission: I have personally reviewed the available labs and imaging studies at the time of the admission.  Pertinent labs:   Glucose 135 AP 152 Albumin 2.4 AST 50/ALT 48 WBC 15.2 Hgb 10.8 Platelets 128 COVID pending, negative on 10/24, 10/23, 10/13, 7/31   Assessment/Plan Active Problems:   IPF (idiopathic pulmonary fibrosis) ([place])   Chronic hypoxemic respiratory failure (HCC)   AAA (abdominal aortic aneurysm) (HCC)   Cognitive decline   Goals of care, counseling/discussion    -Patient with recent hospitalization for sepsis with PNA -Has had continued to decline since d/c -He is enrolled in palliative care but his wife is struggling with decision-making -He was less responsive today and his wife panicked and called 911 -He was back to his new baseline in the ER -He is on slightly more than baseline 4L O2, currently 5L -His wife is now comfortable taking him home - the risks of exposure to [place] with admission are likely higher than the risks of her taking him home -She appears to need increased palliative care assistance, possibly transition to hospice -I would recommend a short trial of antibiotics to  see if this leads to any improvement -Patient, wife, and ER physician are all in agreement with this plan at this time. -She is not continuing the amlodipine and Lopressor that were started during the prior hospitalization for his AAA with endoleak.     Karmen Bongo MD [facility]   How to contact the [facility] Attending or Consulting provider [place] or covering provider during after hours [place], for this patient?  1. Check the care team in [facility] and look for a) attending/consulting [facility] provider listed and b) the [facility] team listed 2. Log into [email or web address] and use [place]'s universal password to access. If you do not have the password, please contact the hospital operator. 3. Locate the [facility] provider you are looking for under [facility] and page to a number that you can be directly reached. 4. If you still have difficulty reaching the provider, please page the [facility] (Director on Call) for the Hospitalists listed on amion for assistance.   01/29/2019, 10:50 PM

## 2019-01-29 NOTE — ED Notes (Signed)
Pt made ware we need urine sample wife at bedside

## 2019-01-29 NOTE — ED Notes (Signed)
MD at bedside. 

## 2019-01-29 NOTE — ED Triage Notes (Signed)
Pt arrived via [facility] from home CC SOB EMS witnessed syncope. PT is DNR golden ticket present on arrival. Started new BP meds last week ( "metoprolol Norvasc") during hospital admission for Pneumonia and UTI. Pt last took BP meds yesterday.   Hx Triple A stented "known to leak", Pulmonary fibrosis, At home O2 4L  20G Left FA 553ml in route

## 2019-01-29 NOTE — Discharge Instructions (Addendum)
Please return for any problem.    Follow-up with your care provider as instructed.

## 2019-01-29 NOTE — ED Notes (Signed)
[facility] called for transport.  

## 2019-01-29 NOTE — ED Provider Notes (Signed)
[facility] DEPT Provider Note   CSN: [id number] Arrival date & time: 01/29/19  1649     History   Chief Complaint Chief Complaint  Patient presents with  . Loss of Consciousness    HPI [name] [name] is a 85 y.o. male.     85 year old male with prior medical history as detailed below presents for evaluation of possible syncopal event.  Patient is DNR.  Patient with dementia and is unable to provide significant history.  Per EMS report patient was noted to be nearly unconscious per family.  This developed acutely prior to EMS transport.  EMS reports the patient was "in and out" of consciousness during transport.  Patient's blood pressure was noted to be mildly lower than normal during transport.  Upon arrival to the ED, patient is now at his baseline.  His blood pressure appears to be within normal range.  Level 5 caveat secondary to dementia.  The history is provided by the patient, medical records and the spouse.  Loss of Consciousness Episode history:  Single Most recent episode:  Today Timing:  Constant Progression:  Waxing and waning Chronicity:  New Relieved by:  Nothing Worsened by:  Nothing   Past Medical History:  Diagnosis Date  . AAA (abdominal aortic aneurysm) ([place])   . Allergy   . Carotid artery stenosis   . Cognitive decline   . Diplopia   . GERD (gastroesophageal reflux disease)   . Hyperlipidemia   . Memory loss   . Prostate cancer ([place])   . Pulmonary fibrosis ([place])   . Spondylisthesis 2015   Spine  . Stroke ([place]) 04/18/13   some weakness left-returning strength  . TIA (transient ischemic attack)     Patient Active Problem List   Diagnosis Date Noted  . Goals of care, counseling/discussion   . Palliative care by specialist   . Vertebral compression fracture ([place]) 01/13/2019  . Community acquired pneumonia 01/13/2019  . Pneumonia 01/13/2019  . Pressure injury of skin 01/13/2019  . Lower urinary tract  infectious disease 10/20/2018  . TIA (transient ischemic attack)   . Pulmonary fibrosis ([place])   . Prostate cancer ([place])   . GERD (gastroesophageal reflux disease)   . Diplopia   . Carotid artery stenosis   . Allergy   . AAA (abdominal aortic aneurysm) ([place])   . Cognitive decline   . Multiple subsegmental pulmonary emboli without acute cor pulmonale ([place]) 08/24/2018  . Lobar pneumonia, unspecified organism ([place]) 08/24/2018  . Hyperlipidemia 05/31/2017  . Chronic hypoxemic respiratory failure ([place]) 07/20/2016  . IPF (idiopathic pulmonary fibrosis) ([place]) 07/11/2015  . H/O endovascular stent graft for abdominal aortic aneurysm 07/11/2015  . H/O carotid endarterectomy 07/11/2015  . History of stroke 05/06/2015  . Swelling of ankle joint 04/23/2014  . Memory loss 05/29/2013  . Mild cognitive impairment 05/29/2013  . CAD (coronary artery disease) 05/25/2013  . AAA (abdominal aortic aneurysm) without rupture ([place]) 05/15/2013  . Occlusion and stenosis of carotid artery with cerebral infarction 04/19/2013  . Cerebral infarction ([place]) 04/18/2013  . Other and unspecified hyperlipidemia 04/18/2013  . Carotid stenosis 04/18/2013  . Stroke ([place]) 04/18/2013  . Spondylisthesis 2015  . Occlusion and stenosis of carotid artery without mention of cerebral infarction 10/12/2011    Past Surgical History:  Procedure Laterality Date  . ABDOMINAL AORTIC ENDOVASCULAR STENT GRAFT N/A 06/17/2016   Procedure: ABDOMINAL AORTIC ENDOVASCULAR STENT GRAFT;  Surgeon: Waynetta Sandy, MD;  Location: [facility];  Service: Vascular;  Laterality:  N/A;  . CAROTID ENDARTERECTOMY  Sept. 2008   Elective left carotid endarterectomy  . CAROTID ENDARTERECTOMY Right 04/27/2013   CE  . ENDARTERECTOMY Right 04/27/2013   Procedure: ENDARTERECTOMY CAROTID;  Surgeon: Mal Misty, MD;  Location: [facility];  Service: Vascular;  Laterality: Right;  . EYE SURGERY     Bilateral cataract   . FOOT SURGERY      left Achilles  Tendon Repair  . seed implantation  2001   for prostate ca        Home Medications    Prior to Admission medications   Medication Sig Start Date End Date Taking? Authorizing Provider  azelastine (ASTELIN) 0.1 % nasal spray Place 2 sprays into both nostrils 2 (two) times daily.  01/08/15  Yes [provider]  dextromethorphan-guaiFENesin (MUCINEX DM) 30-600 MG 12hr tablet Take 1 tablet by mouth 2 (two) times daily.    Yes [provider]  feeding supplement, ENSURE ENLIVE, (ENSURE ENLIVE) LIQD Take 237 mLs by mouth 2 (two) times daily between meals.   Yes [provider]  L-Methylfolate-B12-B6-B2 (CEREFOLIN) 08-20-48-5 MG TABS TAKE 1 TABLET BY MOUTH EVERY DAY Patient taking differently: Take 1 tablet by mouth daily.  03/30/18  Yes Dennie Bible, NP  loratadine (CLARITIN) 10 MG tablet Take 10 mg by mouth daily.   Yes [provider]  Multiple Vitamins-Minerals (CENTRUM SILVER PO) Take 1 tablet by mouth daily.    Yes [provider]  Omega-3 Fatty Acids (FISH OIL PO) Take 1 capsule by mouth daily.   Yes [provider]  pantoprazole (PROTONIX) 40 MG tablet TAKE 1 TABLET DAILY,30 MINUTES BEFORE DINNER. Patient taking differently: Take 40 mg by mouth daily. 30 minutes before supper 12/15/18  Yes McQuaid, Ronie Spies, MD  predniSONE (DELTASONE) 10 MG tablet TAKE 1 TABLET EVERY DAY WITH BREAKFAST. Patient taking differently: Take 10 mg by mouth daily with breakfast.  10/23/18  Yes Juanito Doom, MD  Resveratrol 100 MG CAPS Take 200 mg by mouth every evening.    Yes [provider]  rivaroxaban (XARELTO) 10 MG TABS tablet Take 1 tablet (10 mg total) by mouth daily. 01/04/19  Yes Mannam, Praveen, MD  rosuvastatin (CRESTOR) 5 MG tablet Take 5 mg by mouth at bedtime.   Yes [provider]  amLODipine (NORVASC) 10 MG tablet Take 1 tablet (10 mg total) by mouth daily. 01/19/19   Kayleen Memos, DO  metoprolol tartrate  (LOPRESSOR) 25 MG tablet Take 1 tablet (25 mg total) by mouth 2 (two) times daily. 01/18/19   Kayleen Memos, DO  QUEtiapine (SEROQUEL) 25 MG tablet Take 1 tablet (25 mg total) by mouth 2 (two) times daily. 01/18/19   Kayleen Memos, DO    Family History Family History  Problem Relation Age of Onset  . Stroke Mother   . Heart disease Mother        After age 51  . Varicose Veins Mother   . Hypertension Mother   . Cancer Mother        Theadora Rama   . Stroke Father   . Heart attack Father   . Deep vein thrombosis Father   . Anuerysm Father   . Heart disease Brother     Social History Social History   Tobacco Use  . Smoking status: Former Smoker    Packs/day: 1.00    Years: 14.00    Pack years: 14.00    Types: Cigarettes    Quit date: 03/27/1965  Years since quitting: 53.8  . Smokeless tobacco: Never Used  Substance Use Topics  . Alcohol use: Yes    Alcohol/week: 1.0 standard drinks    Types: 1 Shots of liquor per week    Comment: 3-4 drinks week bourbon  . Drug use: No     Allergies   Sudafed [pseudoephedrine hcl] and Atorvastatin   Review of Systems Review of Systems  Cardiovascular: Positive for syncope.  All other systems reviewed and are negative.    Physical Exam Updated Vital Signs BP 127/80   Pulse 91   Resp 18   SpO2 100%   Physical Exam Vitals signs and nursing note reviewed.  Constitutional:      General: He is not in acute distress.    Appearance: Normal appearance. He is well-developed.  HENT:     Head: Normocephalic and atraumatic.  Eyes:     Conjunctiva/sclera: Conjunctivae normal.     Pupils: Pupils are equal, round, and reactive to light.  Neck:     Musculoskeletal: Normal range of motion and neck supple.  Cardiovascular:     Rate and Rhythm: Normal rate and regular rhythm.     Heart sounds: Normal heart sounds.  Pulmonary:     Effort: Pulmonary effort is normal. No respiratory distress.     Breath sounds: Normal breath sounds.   Abdominal:     General: There is no distension.     Palpations: Abdomen is soft.     Tenderness: There is no abdominal tenderness.  Musculoskeletal: Normal range of motion.        General: No deformity.  Skin:    General: Skin is warm and dry.  Neurological:     General: No focal deficit present.     Mental Status: He is alert. Mental status is at baseline.      ED Treatments / Results  Labs (all labs ordered are listed, but only abnormal results are displayed) Labs Reviewed  CBC - Abnormal; Notable for the following components:      Result Value   WBC 15.2 (*)    RBC 3.64 (*)    Hemoglobin 10.8 (*)    HCT 35.1 (*)    RDW 17.1 (*)    Platelets 128 (*)    All other components within normal limits  COMPREHENSIVE METABOLIC PANEL - Abnormal; Notable for the following components:   Glucose, Bld 135 (*)    Calcium 8.1 (*)    Total Protein 6.2 (*)    Albumin 2.4 (*)    AST 50 (*)    ALT 48 (*)    Alkaline Phosphatase 152 (*)    All other components within normal limits  CBG MONITORING, ED - Abnormal; Notable for the following components:   Glucose-Capillary 131 (*)    All other components within normal limits  SARS CORONAVIRUS 2 (TAT 6-24 HRS)  URINALYSIS, ROUTINE W REFLEX MICROSCOPIC  TYPE AND SCREEN  ABO/RH    EKG EKG Interpretation  Date/Time:  Monday January 29 2019 17:29:46 EST Ventricular Rate:  108 PR Interval:    QRS Duration: 88 QT Interval:  324 QTC Calculation: 435 R Axis:   -5 Text Interpretation: Sinus tachycardia Atrial premature complexes Borderline repolarization abnormality Confirmed by Dene Gentry [phone]) on 01/29/2019 5:37:07 PM   Radiology Dg Chest Port 1 View  Result Date: 01/29/2019 CLINICAL DATA:  Syncope EXAM: PORTABLE CHEST 1 VIEW COMPARISON:  January 13, 2019 FINDINGS: There is a persistent airspace opacity involving much of the left mid  and left lower lung zone. There are scattered airspace opacities throughout the right mid and  right lower lung zones. The heart size remains enlarged. There is no pneumothorax. No large pleural effusion. IMPRESSION: 1. Persistent findings of interstitial lung disease with relatively unchanged consolidation involving much of the left lung field. 2. Unchanged cardiomegaly. Electronically Signed   By: Constance Holster M.D.   On: 01/29/2019 18:15   Ct Angio Chest/abd/pel For Dissection W And/or Wo Contrast  Result Date: 01/29/2019 CLINICAL DATA:  Recent syncopal episode, history of abdominal aortic aneurysm with known endoleak EXAM: CT ANGIOGRAPHY CHEST, ABDOMEN AND PELVIS TECHNIQUE: Multidetector CT imaging through the chest, abdomen and pelvis was performed using the standard protocol during bolus administration of intravenous contrast. Multiplanar reconstructed images and MIPs were obtained and reviewed to evaluate the vascular anatomy. CONTRAST:  162mL OMNIPAQUE IOHEXOL 350 MG/ML SOLN COMPARISON:  01/12/2019, 01/13/2019 FINDINGS: CTA CHEST FINDINGS Cardiovascular: Initial precontrast images demonstrate diffuse atherosclerotic calcification of the thoracic aorta. No hyperdense crescent is noted to suggest intramural hematoma. No aneurysmal dilatation of the ascending aorta is noted. Coronary calcifications are seen. No significant cardiac enlargement is noted. The pulmonary artery as visualized shows no definitive filling defects to suggest pulmonary emboli. Mediastinum/Nodes: Thoracic inlet is within normal limits. Scattered small mediastinal lymph nodes are identified stable from the recent exam. The esophagus as visualized is within normal limits. Lungs/Pleura: Stable interstitial changes and fibrotic change is noted. Persistent consolidative changes are seen within the left upper lobe and lingula. Changes of bronchiectasis in the left upper lobe are again seen predominately in the region of the lingula. Otherwise stable fibrotic changes are noted in the bases left greater than right. No new focal  infiltrate or sizable parenchymal nodule is seen. Musculoskeletal: Degenerative changes of the thoracic spine are noted. No acute rib abnormality is seen. No compression deformity is noted. Review of the MIP images confirms the above findings. CTA ABDOMEN AND PELVIS FINDINGS VASCULAR Aorta: Abdominal aorta demonstrates atherosclerotic calcifications. There are again changes consistent with stent graft placement. The stent graft is patent with runoff into the iliac arteries bilaterally. The aneurysm sac again measures 7.4 x 5.5 cm in greatest transverse and AP dimensions respectively. This is roughly similar to that seen on the prior exam given some slight variation in the measurement technique. Persistent likely type 2 endoleak is again seen and stable the degree of early arterial filling within the aneurysm sac is less than that seen on the prior exam. No evidence of impending rupture is identified. Celiac: Atherosclerotic plaque is noted at the origin of the celiac axis without focal hemodynamically significant stenosis. SMA: Plaque is noted at the origin of the SMA without focal significant stenosis. Renals: The main renal arteries are identified bilaterally. The accessory right renal artery seen previously is not well appreciated on today's exam. Overall decreased enhancement of the lower pole of the right kidney is again seen due to the lack of significant flow in the accessory renal artery. IMA: IMA is visualized although proximally at its origin from the aneurysm sac it is not opacified and likely fed by collaterals from the superior mesenteric artery. Iliacs: Iliac arteries are widely patent with mild atherosclerotic disease. Veins: No specific venous abnormality is noted. Review of the MIP images confirms the above findings. NON-VASCULAR Hepatobiliary: Gallbladder is decompressed. The liver is within normal limits. Pancreas: Unremarkable. No pancreatic ductal dilatation or surrounding inflammatory changes.  Spleen: Normal in size without focal abnormality. Adrenals/Urinary Tract: Adrenal glands are  within normal limits. The kidneys demonstrate a normal enhancement pattern bilaterally with the exception of decreased perfusion in the lower pole of the right kidney. No obstructive changes are seen. The bladder is partially decompressed. Stomach/Bowel: Scattered diverticular change of the colon is noted without evidence of diverticulitis. The appendix is not well visualized. No small bowel or gastric abnormality is seen. Lymphatic: No significant lymphadenopathy is noted. Reproductive: Prostate demonstrates multiple radiotherapy seeds. Other: No abdominal wall hernia or abnormality. No abdominopelvic ascites. Musculoskeletal: Bilateral L5 pars defects are noted with grade 2 anterolisthesis of L5 on S1. Compression deformities at L1 and L2 are again seen and relatively stable in appearance. Review of the MIP images confirms the above findings. IMPRESSION: CTA of the chest: No significant aneurysmal dilatation of the thoracic aorta. Atherosclerotic calcifications are noted. No findings of dissection are noted. Chronic fibrotic changes with areas of consolidation within the lingula and increasing bronchiectasis in the lingula. No new focal abnormality is seen. CTA of the abdomen and pelvis: Abdominal aortic aneurysm with relatively stable size of the aneurysm sac when compared with the most recent exam given some variation in the measuring technique. Persistent likely type 2 endoleak is noted although the degree of enhancement is slightly decreased when compared with the prior exam. No findings to suggest impending rupture are seen. Patent stent graft within the aorta Chronic changes in the abdomen and pelvis similar to that seen on the prior exam without acute abnormality. Electronically Signed   By: Inez Catalina M.D.   On: 01/29/2019 19:46    Procedures Procedures (including critical care time)  Medications Ordered in  ED Medications  sodium chloride (PF) 0.9 % injection (has no administration in time range)  sodium chloride flush (NS) 0.9 % injection 3 mL (3 mLs Intravenous Given 01/29/19 1744)  iohexol (OMNIPAQUE) 350 MG/ML injection 100 mL (100 mLs Intravenous Contrast Given 01/29/19 1916)     Initial Impression / Assessment and Plan / ED Course  I have reviewed the triage vital signs and the nursing notes.  Pertinent labs & imaging results that were available during my care of the patient were reviewed by me and considered in my medical decision making (see chart for details).        MDM  Screen complete  [name] [name] was evaluated in Emergency Department on 01/29/2019 for the symptoms described in the history of present illness. He was evaluated in the context of the global COVID-19 pandemic, which necessitated consideration that the patient might be at risk for infection with the SARS-CoV-2 virus that causes COVID-19. Institutional protocols and algorithms that pertain to the evaluation of patients at risk for COVID-19 are in a state of rapid change based on information released by regulatory bodies including the CDC and federal and state organizations. These policies and algorithms were followed during the patient's care in the ED.  Patient is presenting for evaluation of possible syncope.  Workup in the ED did not reveal significant abnormality.  Plan of care discussed extensively the patient's wife at bedside.  Patient has been evaluated by Dr. Lorin Mercy of the hospitalist service for possible admission.  Patient is unlikely to benefit greatly from admission.    Patient will be discharged home.  Patient's wife understands the need for close follow-up.    Strict return precautions given and understood.  Importance of close follow-up is repeatedly stressed.   Final Clinical Impressions(s) / ED Diagnoses   Final diagnoses:  Syncope and collapse    ED  Discharge Orders    None        Valarie Merino, MD 01/29/19 2135

## 2019-01-30 ENCOUNTER — Other Ambulatory Visit: Payer: Self-pay | Admitting: Pulmonary Disease

## 2019-01-30 LAB — ABO/RH: ABO/RH(D): O POS

## 2019-01-30 LAB — SARS CORONAVIRUS 2 (TAT 6-24 HRS): SARS Coronavirus 2: NEGATIVE

## 2019-01-30 NOTE — Progress Notes (Deleted)
Patient ID: [name] [name], male   DOB: 16-Aug-1929, 85 y.o.   MRN: [id number]     85 y.o.  F/u for his vascular disease. He is s/p bilateral CEA and EVAR for AAA  Denies history of CAD.    Myovue was non ischemic 05/27/13  EF 56%    Having some memory issues   CT 07/01/15  ILD with bronchiectasis moderate honeycombing UIP History of PE on xarelto Supposed to be on oxygen 5 liters  Wife having a hard time adjusting to his medical illnesses especially ILD and dementia  EVAR with AAA repair Dr Idalia Needle March 2018 [facility] Bi-iliac endovascular stent graft with ipsilateral right main body 28 x 12 x 16cm and contralateral limb 12 x 14cm   endoleak persists with increase in size of sac 7.2 x 5.8 cm Dr Donzetta Matters indicated ok to stay on [place]   Admitted 01/18/19 with more confusion and anemia with Hb 9.6 FOB negative  D/c home with palliative care services Rx for pneumonia/UTI  Antibiotics and prednisone  Seroquel added for metabolic encephalopathy and dementia  Seen again in ED 01/29/19 syncope ? Unconscious  DNR / palliative care   Hb 10.8  BUN 17 Cr 0.94 COVID negative  ECG SR with PAC;s Hydrated and d/c home BP noted 127/80 mmHg CTA no change endoleak and CXR only ILD/bronchiectasis with no acute findings   ***  ROS: Denies fever, malais, weight loss, blurry vision, decreased visual acuity, cough, sputum, SOB, hemoptysis, pleuritic pain, palpitaitons, heartburn, abdominal pain, melena, lower extremity edema, claudication, or rash.  All other systems reviewed and negative  General: There were no vitals taken for this visit. Dementia  Chronically ill elderly male  HEENT: normal Neck supple with no adenopathy JVP normal post bilateral CEA;s with bruits no thyromegaly Lungs clear with no wheezing and good diaphragmatic motion Heart:  S1/S2 SEM  murmur, no rub, gallop or click PMI normal Abdomen: benighn, BS positve, no tenderness, no AAA no bruit.  No HSM or HJR Distal pulses  intact with no bruits No edema Neuro non-focal Skin warm and dry No muscular weakness    Current Outpatient Medications  Medication Sig Dispense Refill  . amLODipine (NORVASC) 10 MG tablet Take 1 tablet (10 mg total) by mouth daily. 30 tablet 0  . azelastine (ASTELIN) 0.1 % nasal spray Place 2 sprays into both nostrils 2 (two) times daily.   4  . azithromycin (ZITHROMAX) 250 MG tablet Take 1 tablet (250 mg total) by mouth daily. Take first 2 tablets together, then 1 every day until finished. 6 tablet 0  . dextromethorphan-guaiFENesin (MUCINEX DM) 30-600 MG 12hr tablet Take 1 tablet by mouth 2 (two) times daily.     . feeding supplement, ENSURE ENLIVE, (ENSURE ENLIVE) LIQD Take 237 mLs by mouth 2 (two) times daily between meals.    Marland Kitchen L-Methylfolate-B12-B6-B2 (CEREFOLIN) 08-20-48-5 MG TABS TAKE 1 TABLET BY MOUTH EVERY DAY (Patient taking differently: Take 1 tablet by mouth daily. ) 90 each 3  . loratadine (CLARITIN) 10 MG tablet Take 10 mg by mouth daily.    . metoprolol tartrate (LOPRESSOR) 25 MG tablet Take 1 tablet (25 mg total) by mouth 2 (two) times daily. 60 tablet 0  . Multiple Vitamins-Minerals (CENTRUM SILVER PO) Take 1 tablet by mouth daily.     . Omega-3 Fatty Acids (FISH OIL PO) Take 1 capsule by mouth daily.    . pantoprazole (PROTONIX) 40 MG tablet TAKE 1 TABLET DAILY,30 MINUTES BEFORE DINNER. (Patient taking  differently: Take 40 mg by mouth daily. 30 minutes before supper) 30 tablet 3  . predniSONE (DELTASONE) 10 MG tablet TAKE 1 TABLET EVERY DAY WITH BREAKFAST. (Patient taking differently: Take 10 mg by mouth daily with breakfast. ) 30 tablet 2  . QUEtiapine (SEROQUEL) 25 MG tablet Take 1 tablet (25 mg total) by mouth 2 (two) times daily. 60 tablet 0  . Resveratrol 100 MG CAPS Take 200 mg by mouth every evening.     . rivaroxaban (XARELTO) 10 MG TABS tablet Take 1 tablet (10 mg total) by mouth daily. 30 tablet 5  . rosuvastatin (CRESTOR) 5 MG tablet Take 5 mg by mouth at bedtime.      Current Facility-Administered Medications  Medication Dose Route Frequency Provider Last Rate Last Dose  . aspirin EC tablet 81 mg  81 mg Oral Daily Garvin Fila, MD        Allergies  Sudafed [pseudoephedrine hcl] and Atorvastatin  Electrocardiogram:  11/21/13  NSR normal ECG  01/22/15  SR rate 65 LVH no change 12/16/16 NSR rate 72 normal   Assessment and Plan  Carotid: right bruit no change Post bilateral endarterectomies Duplex done 10/11/18 with no restenosis   AAA: Post EVAR with type two endoleak f/u Dr Donzetta Matters [facility]  Chol:  Lab Results  Component Value Date   [place] 64 04/19/2013    Pulmonary: seems to have advanced ILD continue oxygen and steroids f/u [facility]  Dementia: with some tardo kinesia consider neuro evaluation and [place]   "Syncope:  No evidence of cardiac arrhythmia likely multi factorial with anemia, advanced lung disease And dementia:  Given age, dementia, DNR and palliative care no further cardiac w/u Echo reviewed from  12/25/18 EF 50-55% no advanced pulmonary HTN, pericardial effusion or valve disease   F/U with me in a year   [employer]

## 2019-01-31 ENCOUNTER — Encounter: Payer: Self-pay | Admitting: Pulmonary Disease

## 2019-01-31 ENCOUNTER — Ambulatory Visit: Payer: Medicare Other | Admitting: Pulmonary Disease

## 2019-01-31 ENCOUNTER — Inpatient Hospital Stay: Payer: Medicare Other | Admitting: Primary Care

## 2019-01-31 ENCOUNTER — Other Ambulatory Visit: Payer: Self-pay

## 2019-01-31 VITALS — BP 110/66 | HR 80 | Temp 97.6°F

## 2019-01-31 DIAGNOSIS — J84112 Idiopathic pulmonary fibrosis: Secondary | ICD-10-CM

## 2019-01-31 NOTE — Patient Instructions (Signed)
I have reviewed the CT scan which does not show any clear evidence of pneumonia Please continue the antibiotic and finish it We can stop the Xarelto as I do not think it will help anymore at this point Agree with holding the blood pressure medication  We will do telemetry video visit in 4 weeks for reevaluation.

## 2019-01-31 NOTE — Progress Notes (Addendum)
[name] [name]    [id number]    05-04-1929  Primary Care Physician:Griffin, Margaretha Sheffield, MD  Referring Physician: Kelton Pillar, MD [place] [employer] [place],  [place] [address]  Chief complaint:   Follow-up for IPF  HPI: 85 year old with IPF, chronic respiratory failure, PE, pneumonia, GERD, stroke, AAA, coronary artery disease, advanced dementia  Previously patient of Dr. Lenna Gilford.  Diagnosed with IPF from 2017.  After extensive discussion with the family they have decided not to pursue anti-fibrotics.  On discussion with patient's wife they had a friend who decompensated quickly after being started on anti-fibrotics for IPF and did not want to go through with a similar course.  Maintained on prednisone at 10 mg. Diagnosed with pulmonary embolism on 08/17/2018.  Maintained on Xarelto.  Interim history: Admitted from 10/23-10/29 with worsening respiratory failure.  He is treated as a pneumonia with antibiotics.  He returned back to ED on 10/9 with syncope, unresponsiveness.  CTA at that time showed persistent consolidation, interstitial lung disease and was given additional azithromycin.  Also noted to have endovascular leak of AAA.  Evaluated by vascular surgery and felt no intervention needed.  Overall he continues to decline with fatigue, dyspnea.  He also has occasional chills or rigors which have been going on for the past 2 years without any clear etiology.  Outpatient Encounter Medications as of 01/31/2019  Medication Sig   azelastine (ASTELIN) 0.1 % nasal spray Place 2 sprays into both nostrils 2 (two) times daily.    azithromycin (ZITHROMAX) 250 MG tablet Take 1 tablet (250 mg total) by mouth daily. Take first 2 tablets together, then 1 every day until finished.   dextromethorphan-guaiFENesin (MUCINEX DM) 30-600 MG 12hr tablet Take 1 tablet by mouth 2 (two) times daily.    feeding supplement, ENSURE ENLIVE, (ENSURE ENLIVE) LIQD Take 237 mLs by mouth 2  (two) times daily between meals.   L-Methylfolate-B12-B6-B2 (CEREFOLIN) 08-20-48-5 MG TABS TAKE 1 TABLET BY MOUTH EVERY DAY (Patient taking differently: Take 1 tablet by mouth daily. )   loratadine (CLARITIN) 10 MG tablet Take 10 mg by mouth daily.   Multiple Vitamins-Minerals (CENTRUM SILVER PO) Take 1 tablet by mouth daily.    Omega-3 Fatty Acids (FISH OIL PO) Take 1 capsule by mouth daily.   pantoprazole (PROTONIX) 40 MG tablet TAKE 1 TABLET DAILY,30 MINUTES BEFORE DINNER. (Patient taking differently: Take 40 mg by mouth daily. 30 minutes before supper)   predniSONE (DELTASONE) 10 MG tablet TAKE 1 TABLET EVERY DAY WITH BREAKFAST. (Patient taking differently: Take 10 mg by mouth daily with breakfast. )   Resveratrol 100 MG CAPS Take 200 mg by mouth every evening.    rivaroxaban (XARELTO) 10 MG TABS tablet Take 1 tablet (10 mg total) by mouth daily.   rosuvastatin (CRESTOR) 5 MG tablet Take 5 mg by mouth at bedtime.   [DISCONTINUED] amLODipine (NORVASC) 10 MG tablet Take 1 tablet (10 mg total) by mouth daily.   [DISCONTINUED] metoprolol tartrate (LOPRESSOR) 25 MG tablet Take 1 tablet (25 mg total) by mouth 2 (two) times daily.   [DISCONTINUED] QUEtiapine (SEROQUEL) 25 MG tablet Take 1 tablet (25 mg total) by mouth 2 (two) times daily.   Facility-Administered Encounter Medications as of 01/31/2019  Medication   aspirin EC tablet 81 mg   Physical Exam: Blood pressure 110/66, pulse 80, temperature 97.6 F (36.4 C), temperature source Temporal, SpO2 96 %. Gen:      No acute distress HEENT:  EOMI, sclera anicteric Neck:     No masses; no thyromegaly Lungs:   Bilateral extensive crackles CV:         Regular rate and rhythm; no murmurs Abd:      + bowel sounds; soft, non-tender; no palpable masses, no distension Ext:    No edema; adequate peripheral perfusion Skin:      Warm and dry; no rash Neuro: alert and oriented x 3 Psych: Awake, does not respond to questions.  Data  Reviewed: Imaging: CT 08/17/2018- small bilateral segmental and subsegmental pulmonary embolism.  Severe interstitial lung disease with significant progression fibrosis since 2017.  CT chest 01/13/2019-advanced interstitial lung disease, consolidative changes in the lingula.  CTA 01/29/2019-chronic fibrotic changes with persistent consolidation in the lingula.  Echocardiogram 12/25/2018- LVEF 50-55%, moderate pulmonary hypertension.  RV systolic pressure 56.  Mild RV enlargement with normal RV systolic function.  Assessment:  IPF Reviewed course of disease with CT scan showing marked progression of pulmonary fibrosis.  He has received multiple courses for pneumonia due to consolidation in the lingula but I feel this is probably progression of IPF with acute exacerbation.  He continues to decline rapidly.  Discussed with wife in clinic today He is currently on palliative care but wife is not ready for full hospice yet We agreed that it would not be in the interest of the patient to have anymore hospital or ED visits. We will try to manage as best we can at home.  If he worsens then we will transition to full comfort  Order hospital bed for home as patient requires frequent repositioning of the body to alleviate pain or pressure that cannot be done in an ordinary bed  Pulmonary embolism, hypertension Echo reviewed with moderate pulmonary hypertension. He has also already received 6 months of anticoagulation. We will stop the Xarelto as he may have a fall and complications of bleeding.  Besides he has a AAA with ongoing endovascular leak We are also holding blood pressure medications due to episodes of low blood pressure  Plan/Recommendations: - Finish course of azithromycin - Hospital bed - Stop xarelto, amlodipine, lopressor  Marshell Garfinkel MD [facility] 01/31/2019, 1:47 PM  CC: Kelton Pillar, MD

## 2019-02-01 ENCOUNTER — Other Ambulatory Visit: Payer: Self-pay | Admitting: Pulmonary Disease

## 2019-02-05 ENCOUNTER — Ambulatory Visit: Payer: Medicare Other | Admitting: Cardiovascular Disease

## 2019-02-06 ENCOUNTER — Telehealth: Payer: Self-pay | Admitting: Pulmonary Disease

## 2019-02-06 DIAGNOSIS — J84112 Idiopathic pulmonary fibrosis: Secondary | ICD-10-CM

## 2019-02-06 NOTE — Telephone Encounter (Signed)
Call returned to patient wife, confirmed DOB, she is requesting that a order be signed for the patient to get a hospital bed. I made her aware I have checked with Dr. Matilde Bash nurse and they have not received any paper work as of yet per [employer]. Phone number provided for [facility] per wife [phone] [facility] Case Worker. Per wife request please call case worker.   Form obtained from Dr. Matilde Bash box, form is an order but no place requesting a signature.   Call made to case worker Genelle Gather, she states the form just needs to have his signature somewhere on the form. Case worker also requesting a hoyer lift for the patient. I made her aware I would get this message to Dr. Vaughan Browner. Voiced understanding.   PM please advise. Can we give order for hoyer lift Form given to [place]. Thanks.

## 2019-02-07 ENCOUNTER — Other Ambulatory Visit: Payer: Self-pay

## 2019-02-07 DIAGNOSIS — J84112 Idiopathic pulmonary fibrosis: Secondary | ICD-10-CM

## 2019-02-07 NOTE — Telephone Encounter (Signed)
Dr. Vaughan Browner has signed the form and its been faxed back. An order will be placed for the [facility] lift as approved by Dr. Vaughan Browner. Nothing further is needed.

## 2019-02-12 ENCOUNTER — Telehealth: Payer: Self-pay | Admitting: Pulmonary Disease

## 2019-02-12 NOTE — Telephone Encounter (Signed)
The callback number left for Angela with [employer] was incorrect, and the call could not go through.  Pt does in fact need a Hoyer lift, which is why a [employer] fast streamer was ordered.  A hospital bed was entered only on a telephone note by a non-clinical staff.  Per documentation, clinical staff clarified that a Harrel Lemon lift was needed, approved by Dr. Vaughan Browner, and faxed back.   There is no need or request for a hospital bed.   Will close encounter as no changes need to be made to [facility] lift order, and there is no correct callback number for Levada Dy.

## 2019-02-13 ENCOUNTER — Telehealth: Payer: Self-pay | Admitting: Pulmonary Disease

## 2019-02-13 DIAGNOSIS — J84112 Idiopathic pulmonary fibrosis: Secondary | ICD-10-CM

## 2019-02-13 NOTE — Telephone Encounter (Signed)
Order updated. Separate text message sent to Dr. Vaughan Browner by Danae Chen to make him aware of the correction needed.

## 2019-02-13 NOTE — Telephone Encounter (Signed)
Catron, [place], Harmon Dun, [place]; Herbert Moors, Samuel Germany        got the order for hoyer lift but it also mentions a hospital bed in the phone notes. Is it supposed to be for hospital bed and hoyer lift? If so we need an order for semi electric hospital bed. Notes will need to state he requires frequent repositioning of the body to alleviate pain that cannot be done in an ordinary bed.

## 2019-02-19 ENCOUNTER — Telehealth: Payer: Self-pay | Admitting: Pulmonary Disease

## 2019-02-19 ENCOUNTER — Other Ambulatory Visit: Payer: Medicare Other | Admitting: Internal Medicine

## 2019-02-19 ENCOUNTER — Other Ambulatory Visit: Payer: Self-pay

## 2019-02-19 DIAGNOSIS — Z515 Encounter for palliative care: Secondary | ICD-10-CM

## 2019-02-19 DIAGNOSIS — J84112 Idiopathic pulmonary fibrosis: Secondary | ICD-10-CM

## 2019-02-19 NOTE — Telephone Encounter (Signed)
Yes. okay to send order

## 2019-02-19 NOTE — Telephone Encounter (Signed)
Spoke with [name] [name] with [facility]  She is asking for order for hospital bed to be sent to [facility]  Please advise if okay to send order, thanks

## 2019-02-19 NOTE — Telephone Encounter (Signed)
Order sent to [facility] Spoke with [facility] and notified that this was done  Nothing further needed

## 2019-02-20 ENCOUNTER — Telehealth: Payer: Self-pay | Admitting: Pulmonary Disease

## 2019-02-20 DIAGNOSIS — R5381 Other malaise: Secondary | ICD-10-CM

## 2019-02-20 DIAGNOSIS — J84112 Idiopathic pulmonary fibrosis: Secondary | ICD-10-CM

## 2019-02-20 NOTE — Telephone Encounter (Signed)
Per Levada Dy w/ [employer]:  Mariann Barter sent to Asa Saunas, Stanford Breed, Melissa        I just tried twice to call out to you all and it said all circuits are busy.   Previous Messages  ----- Message -----  From: Satira Anis  Sent: 02/20/2019  9:21 AM EST  To: Samuel Germany Catron  Subject: RE: Hospital Bed                 Morning, Levada Dy,   Noted in patient's chart, however, this will require a telephone message.    Thanks,  Earnest Bailey  ----- Message -----  From: Mariann Barter  Sent: 02/20/2019  8:42 AM EST  To: Darlina Guys, Elon Alas, *  Subject: RE: [place] and Horris Latino, are you all handling this hospital bed?   If not, [employer] will need the order to state semi electric hospital bed, and the notes will need to state- patient requires frequent repositioning of the body to alleviate pain or pressure that cannot be done in an ordinary bed. Please let me know if Freda Munro / Horris Latino are handling, and if not, when the order and notes are updated.   Thank you

## 2019-02-20 NOTE — Telephone Encounter (Signed)
OK- I have placed a new order stating "semi electric hospital bed"  Will forward to Dr Vaughan Browner to see if he can addend last ov note to include the following:  "patient requires frequent repositioning of the body to alleviate pain or pressure that cannot be done in an ordinary bed"  Please advise thanks!

## 2019-02-21 ENCOUNTER — Telehealth: Payer: Self-pay | Admitting: Pulmonary Disease

## 2019-02-21 DIAGNOSIS — J8410 Pulmonary fibrosis, unspecified: Secondary | ICD-10-CM

## 2019-02-21 NOTE — Telephone Encounter (Signed)
We are already aware  Will forward back to Dr Vaughan Browner to see if he can addend last ov note t"patient requires frequent repositioning of the body to alleviate pain or pressure that cannot be done in an ordinary bed"  Please advise thanks

## 2019-02-21 NOTE — Telephone Encounter (Signed)
[name] [name] from [facility] states needs office note to state. Patient requires frequent repositioning of body to aleviate pain or pressure cannot be done in ordinary bed.  [name] [name] phone number is [phone].

## 2019-02-21 NOTE — Telephone Encounter (Signed)
Spoke with [name] [name], pt's hospice nurse  She is asking if Dr Vaughan Browner thinks that the pt may benefit from a nebulizer to help with his congested cough  She is asking also if we can prescribe remeron to help with appetite stimulation Please advise thanks!

## 2019-02-22 MED ORDER — IPRATROPIUM-ALBUTEROL 0.5-2.5 (3) MG/3ML IN SOLN: 3.0000 mL | RESPIRATORY_TRACT | 2 refills | Status: AC | PRN

## 2019-02-22 MED ORDER — MIRTAZAPINE 15 MG PO TABS: 15.0000 mg | ORAL_TABLET | Freq: Every day | ORAL | 5 refills | Status: AC

## 2019-02-22 NOTE — Telephone Encounter (Signed)
Done

## 2019-02-22 NOTE — Telephone Encounter (Signed)
That is fine. Please order  Duo nebs every 4 hours as needed Remeron 15 mg at night

## 2019-02-22 NOTE — Telephone Encounter (Signed)
Spoke with [name] [name] and notified of recs per Dr Vaughan Browner  She verbalized understanding  Rxs sent to [facility] and the neb machine and supplies order sent to [employer]

## 2019-02-22 NOTE — Telephone Encounter (Signed)
LMTCB for Levada Dy to let her know that this was done

## 2019-02-23 ENCOUNTER — Telehealth: Payer: Self-pay | Admitting: Pulmonary Disease

## 2019-02-23 ENCOUNTER — Other Ambulatory Visit: Payer: Medicare Other | Admitting: Internal Medicine

## 2019-02-23 DIAGNOSIS — Z515 Encounter for palliative care: Secondary | ICD-10-CM

## 2019-02-23 DIAGNOSIS — L97522 Non-pressure chronic ulcer of other part of left foot with fat layer exposed: Secondary | ICD-10-CM | POA: Diagnosis not present

## 2019-02-23 DIAGNOSIS — J84112 Idiopathic pulmonary fibrosis: Secondary | ICD-10-CM

## 2019-02-23 DIAGNOSIS — I739 Peripheral vascular disease, unspecified: Secondary | ICD-10-CM | POA: Diagnosis not present

## 2019-02-23 NOTE — Telephone Encounter (Signed)
Hospice calling back for a verbal order on this pt call them back @ [phone]..need ASAP.[name] [name]

## 2019-02-23 NOTE — Telephone Encounter (Signed)
Discussed with the palliative team. He will go on to hospice with me as attending. Nothing further needed.

## 2019-02-23 NOTE — Telephone Encounter (Signed)
[name] [name], this was forwarded as an FYI:  Called back and spoke to [place]. The code used for the referral is not an approved diagnosis code with the patient insurance.  She stated she talked with the patient wife to make them aware of this and that if insurance will not cover this, they would be able to pay for the machine out of pocket for $90 (approximate) and own the machine.   Ria Comment will fax a copy of the approved codes for Dr. Vaughan Browner to review and advise of what will be appropriate.

## 2019-02-23 NOTE — Telephone Encounter (Signed)
Dr. Vaughan Browner, this patient was last seen by you on 01/31/19 for IPF and hospital follow up. Based on that visit please advise if you will approve the request from hospice. Thank you much.  Allergies  Allergen Reactions  . Sudafed [Pseudoephedrine Hcl]     Causes dizziness and nervousness  . Atorvastatin Other (See Comments)    Reaction: Patient's family reports "confusion" and would prefer crestor    Current Outpatient Medications on File Prior to Visit  Medication Sig Dispense Refill  . azelastine (ASTELIN) 0.1 % nasal spray Place 2 sprays into both nostrils 2 (two) times daily.   4  . azithromycin (ZITHROMAX) 250 MG tablet Take 1 tablet (250 mg total) by mouth daily. Take first 2 tablets together, then 1 every day until finished. 6 tablet 0  . dextromethorphan-guaiFENesin (MUCINEX DM) 30-600 MG 12hr tablet Take 1 tablet by mouth 2 (two) times daily.     . feeding supplement, ENSURE ENLIVE, (ENSURE ENLIVE) LIQD Take 237 mLs by mouth 2 (two) times daily between meals.    Marland Kitchen ipratropium-albuterol (DUONEB) 0.5-2.5 (3) MG/3ML SOLN Take 3 mLs by nebulization every 4 (four) hours as needed. 360 mL 2  . L-Methylfolate-B12-B6-B2 (CEREFOLIN) 08-20-48-5 MG TABS TAKE 1 TABLET BY MOUTH EVERY DAY (Patient taking differently: Take 1 tablet by mouth daily. ) 90 each 3  . loratadine (CLARITIN) 10 MG tablet Take 10 mg by mouth daily.    . mirtazapine (REMERON) 15 MG tablet Take 1 tablet (15 mg total) by mouth at bedtime. 30 tablet 5  . Multiple Vitamins-Minerals (CENTRUM SILVER PO) Take 1 tablet by mouth daily.     . Omega-3 Fatty Acids (FISH OIL PO) Take 1 capsule by mouth daily.    . pantoprazole (PROTONIX) 40 MG tablet TAKE 1 TABLET DAILY,30 MINUTES BEFORE DINNER. (Patient taking differently: Take 40 mg by mouth daily. 30 minutes before supper) 30 tablet 3  . predniSONE (DELTASONE) 10 MG tablet TAKE 1 TABLET EVERY DAY WITH BREAKFAST. 30 tablet 2  . Resveratrol 100 MG CAPS Take 200 mg by mouth every evening.      . rivaroxaban (XARELTO) 10 MG TABS tablet Take 1 tablet (10 mg total) by mouth daily. 30 tablet 5  . rosuvastatin (CRESTOR) 5 MG tablet Take 5 mg by mouth at bedtime.     Current Facility-Administered Medications on File Prior to Visit  Medication Dose Route Frequency Provider Last Rate Last Dose  . aspirin EC tablet 81 mg  81 mg Oral Daily Garvin Fila, MD

## 2019-02-24 ENCOUNTER — Telehealth: Payer: Self-pay | Admitting: Emergency Medicine

## 2019-02-25 NOTE — Progress Notes (Signed)
[facility] Consult Note Telephone: [phone]  Fax: [phone]  PATIENT NAME: [name] [name] DOB: 1929-07-08 MRN: [id number]  PRIMARY CARE PROVIDER:   Kelton Pillar, MD  REFERRING PROVIDER:   Dr. Vaughan Browner  RESPONSIBLE PARTY:   Wife Parris, [place] Spouse [phone]  [phone]    RECOMMENDATIONS and PLAN:  Palliative Care Encounter Z51.5 Idiopathic pulmonary fibrosis  1.  Advance care planning:  Discussed continued state of decline of health and goals remain as comfort care. Conferred with Drs. Monguilod and Mannam who agree with terminal status and Hospice eligibility.  Wife is in agreement with plan to transition to Hospice at home.  Palliatve care will continue to followup with patient until transition to Hospice care.  2.  Shortness of breath: Additional decline with hypoxia and weak cough. Continue respiratory treatments.  Pending Hospice evaluation on 02/25/19   3.  Memory with behaviors:  Related to vascular dementia.  FAST stage 7c.  New baseline.  Provide supportive and total care.  4.  Loss of appetite:  Less nutritional intake.  Appears to be nearing end of life.  Offer comfort foods  Due to the COVID-19 crisis,  This visit occurred telephonically and was initiated and consented by patient and or family.  I spent 60 minutes providing this consultation,  from 1430 to 1530. More than 50% of the time in this consultation was spent coordinating communication with patient.   HISTORY OF PRESENT ILLNESS:  Follow-up with [name].  Wife reports that he remains short of breath with use of 6L of oxygen, hypotensive and hypoxic intermittently.  Increased weakness.  Palliative Care was asked to help address goals of care.   CODE STATUS: DNAR/DNI  PPS: 20% weak HOSPICE ELIGIBILITY/DIAGNOSIS: YES: Advanced pulmonary fibrosis with hypoxia. End stage vascular dementia  PAST MEDICAL HISTORY:  Past Medical  History:  Diagnosis Date  . AAA (abdominal aortic aneurysm) ([place])   . Allergy   . Carotid artery stenosis   . Cognitive decline   . Diplopia   . GERD (gastroesophageal reflux disease)   . Hyperlipidemia   . Memory loss   . Prostate cancer ([place])   . Pulmonary fibrosis ([place])   . Spondylisthesis 2015   Spine  . Stroke ([place]) 04/18/13   some weakness left-returning strength  . TIA (transient ischemic attack)      PERTINENT MEDICATIONS:  Outpatient Encounter Medications as of 01/23/2019  Medication Sig  . amLODipine (NORVASC) 10 MG tablet Take 1 tablet (10 mg total) by mouth daily.  Marland Kitchen azelastine (ASTELIN) 0.1 % nasal spray Place 2 sprays into both nostrils 2 (two) times daily.   . Chlorhexidine Gluconate Cloth 2 % PADS Apply 6 each topically daily for 7 days.  Marland Kitchen dextromethorphan-guaiFENesin (MUCINEX DM) 30-600 MG 12hr tablet Take 1 tablet by mouth 2 (two) times daily.   . feeding supplement, ENSURE ENLIVE, (ENSURE ENLIVE) LIQD Take 237 mLs by mouth 2 (two) times daily between meals for 7 days.  Marland Kitchen L-Methylfolate-B12-B6-B2 (CEREFOLIN) 08-20-48-5 MG TABS TAKE 1 TABLET BY MOUTH EVERY DAY  . loratadine (CLARITIN) 10 MG tablet Take 10 mg by mouth daily.  . metoprolol tartrate (LOPRESSOR) 25 MG tablet Take 1 tablet (25 mg total) by mouth 2 (two) times daily.  . Multiple Vitamins-Minerals (CENTRUM SILVER PO) Take 1 tablet by mouth daily.   . Omega-3 Fatty Acids (FISH OIL PO) Take 1 capsule by mouth daily.  . pantoprazole (PROTONIX) 40 MG tablet TAKE 1 TABLET  DAILY,30 MINUTES BEFORE DINNER.  Marland Kitchen predniSONE (DELTASONE) 10 MG tablet TAKE 1 TABLET EVERY DAY WITH BREAKFAST.  . QUEtiapine (SEROQUEL) 25 MG tablet Take 1 tablet (25 mg total) by mouth 2 (two) times daily.  Marland Kitchen Resveratrol 100 MG CAPS Take 200 mg by mouth every evening.   . rivaroxaban (XARELTO) 10 MG TABS tablet Take 1 tablet (10 mg total) by mouth daily.  . rosuvastatin (CRESTOR) 5 MG tablet Take 5 mg by mouth at bedtime.    PHYSICAL EXAM:    Pulmonary:.  Audible very congested and weak cough Neurological:  Somnolent.  Minimal verbalization  Gonzella Lex, NP-C

## 2019-02-26 ENCOUNTER — Other Ambulatory Visit: Payer: Self-pay

## 2019-02-26 NOTE — Telephone Encounter (Signed)
Will forward to Dr Vaughan Browner as Juluis Rainier that the pt passed

## 2019-02-26 NOTE — Telephone Encounter (Signed)
Tiffany w/[employer] calling back stating pt passed away over the weekend.  [phone]

## 2019-02-27 ENCOUNTER — Telehealth: Payer: Self-pay

## 2019-02-27 NOTE — Telephone Encounter (Signed)
Received dc from [employer].  DC is for cremation and a patient of Doctor Mannam.  DC will be taken to Pulmonary Unit for signature.  On 03/01/2019 Received dc back from Doctor Mannam.  I called the funeral home to let them know the dc is ready for pickup.

## 2019-02-28 ENCOUNTER — Telehealth: Payer: Medicare Other | Admitting: Pulmonary Disease

## 2019-03-06 ENCOUNTER — Telehealth: Payer: Self-pay

## 2019-03-06 NOTE — Telephone Encounter (Signed)
Received dc from [employer] (original)  The first one got misplaced.  DC will be taken to Pulmonary Unit for signature.  On 03/08/2019 Received signed dc back from Doctor Mannam.  I called the funeral home to let them know the dc is ready for pickup.

## 2019-03-23 NOTE — Progress Notes (Signed)
[facility] Consult Note Telephone: [phone]  Fax: [phone]  PATIENT NAME: [name] [name] DOB: 09/09/1929 MRN: [id number]  PRIMARY CARE PROVIDER:   Kelton Pillar, MD  REFERRING PROVIDER:  Kelton Pillar, MD [facility] [employer] [place],  [place] [address]  RESPONSIBLE PARTY:   Wife [name], [name] Spouse [phone]  [phone]    RECOMMENDATIONS and PLAN:  Palliative Care Encounter Z51.5 Idiopathic pulmonary fibrosis  1.  Advance care planning:  Primary goals are to not return to the hospital and receive total care from family and caregivers.  Comfort care in the home.  Attempt improvement of strength with assistance of PT.  DNAR and MOST forms are in place noting comfort measures, antibiotics if indicated and no tube feedings.  Palliatve care will continue to followup with patientl  2.  Shortness of breath: No desire for evaluation in clinic setting.  Resolving pneumonia and worsening of pulmonary fibrosis.  Consider nebulizer treatments with bronchodilator at least every 6 hours.  Continue mucinex and continuous supplemental oxygen.  Re-evaluate for change of status. Consider transition to hospice care if no improvement of hypoxemia and hypotension.  3.  Memory with behaviors:  Related to dementia.  FAST stage 7c.  New baseline.  Provide supportive and total care.  4.  Loss of appetite:  Poor nutritional intake with bites and occasional Ensure.  Consider use of appetite stimulant.  ADDENDUM:  Per authorization of Dr. Vaughan Browner, pt. Will beging Duoneb nebulizer treatments q 4hrs prn shortness of breath and Remeron 15 mg q HS to improve appetite. Meds were sent to pharmacy by pulmonary office.   Due to the COVID-19 crisis,  This visit occurred via telehealth and was initiated and consented by patient and or family.  I spent 60 minutes providing this consultation,  from 1400 to 1500. More than 50% of the time in  this consultation was spent coordinating communication with patient, wife, PT and pulmonary nurse.   HISTORY OF PRESENT ILLNESS:  Follow-up with [name].  Wife reports that he has had increased shortness of breath, pulmonary congestion with coughing, low BP and oxygen levels along with increased fatigue over last 2 days. BP today has been in the [id number] systolic with oxygen levels of 80's to 90 while 6  Palliative Care was asked to help address goals of care.   CODE STATUS: DNAR/DNI  PPS: 30% weak HOSPICE ELIGIBILITY/DIAGNOSIS: YES: Advanced pulmonary fibrosis with hypoxia.  Patient is still receiving PT  PAST MEDICAL HISTORY:  Past Medical History:  Diagnosis Date  . AAA (abdominal aortic aneurysm) ([facility])   . Allergy   . Carotid artery stenosis   . Cognitive decline   . Diplopia   . GERD (gastroesophageal reflux disease)   . Hyperlipidemia   . Memory loss   . Prostate cancer ([place])   . Pulmonary fibrosis ([place])   . Spondylisthesis 2015   Spine  . Stroke ([place]) 04/18/13   some weakness left-returning strength  . TIA (transient ischemic attack)      PERTINENT MEDICATIONS:  Outpatient Encounter Medications as of 01/23/2019  Medication Sig  . amLODipine (NORVASC) 10 MG tablet Take 1 tablet (10 mg total) by mouth daily.  Marland Kitchen azelastine (ASTELIN) 0.1 % nasal spray Place 2 sprays into both nostrils 2 (two) times daily.   . Chlorhexidine Gluconate Cloth 2 % PADS Apply 6 each topically daily for 7 days.  Marland Kitchen dextromethorphan-guaiFENesin (MUCINEX DM) 30-600 MG 12hr tablet Take 1  tablet by mouth 2 (two) times daily.   . feeding supplement, ENSURE ENLIVE, (ENSURE ENLIVE) LIQD Take 237 mLs by mouth 2 (two) times daily between meals for 7 days.  Marland Kitchen L-Methylfolate-B12-B6-B2 (CEREFOLIN) 08-20-48-5 MG TABS TAKE 1 TABLET BY MOUTH EVERY DAY  . loratadine (CLARITIN) 10 MG tablet Take 10 mg by mouth daily.  . metoprolol tartrate (LOPRESSOR) 25 MG tablet Take 1 tablet (25 mg total) by mouth 2  (two) times daily.  . Multiple Vitamins-Minerals (CENTRUM SILVER PO) Take 1 tablet by mouth daily.   . Omega-3 Fatty Acids (FISH OIL PO) Take 1 capsule by mouth daily.  . pantoprazole (PROTONIX) 40 MG tablet TAKE 1 TABLET DAILY,30 MINUTES BEFORE DINNER.  Marland Kitchen predniSONE (DELTASONE) 10 MG tablet TAKE 1 TABLET EVERY DAY WITH BREAKFAST.  . QUEtiapine (SEROQUEL) 25 MG tablet Take 1 tablet (25 mg total) by mouth 2 (two) times daily.  Marland Kitchen Resveratrol 100 MG CAPS Take 200 mg by mouth every evening.   . rivaroxaban (XARELTO) 10 MG TABS tablet Take 1 tablet (10 mg total) by mouth daily.  . rosuvastatin (CRESTOR) 5 MG tablet Take 5 mg by mouth at bedtime.    PHYSICAL EXAM:   General: Chronically ill and  frail appearing elderly male in wheelchair Pulmonary:increased work of breathing.  Very congested non-productive cough Extremities: no edema, no joint deformities Skin: pale in color Neurological: Weakness, alert, oriented to person  Gonzella Lex, NP-C

## 2019-03-23 NOTE — Telephone Encounter (Signed)
Called and notified that the patient has passed away this am.  Death Certificate to be forwarded to our office for Dr Vaughan Browner

## 2019-03-23 DEATH — deceased

## 2019-10-30 IMAGING — DX CHEST - 2 VIEW
2 series · 2 of 2 positions shown · non-contrast
Comparison: Chest CT 08/17/2018 and chest x-ray 08/17/2018.

CLINICAL DATA: Follow-up pneumonia.

EXAM:
CHEST - 2 VIEW

[chest pa]
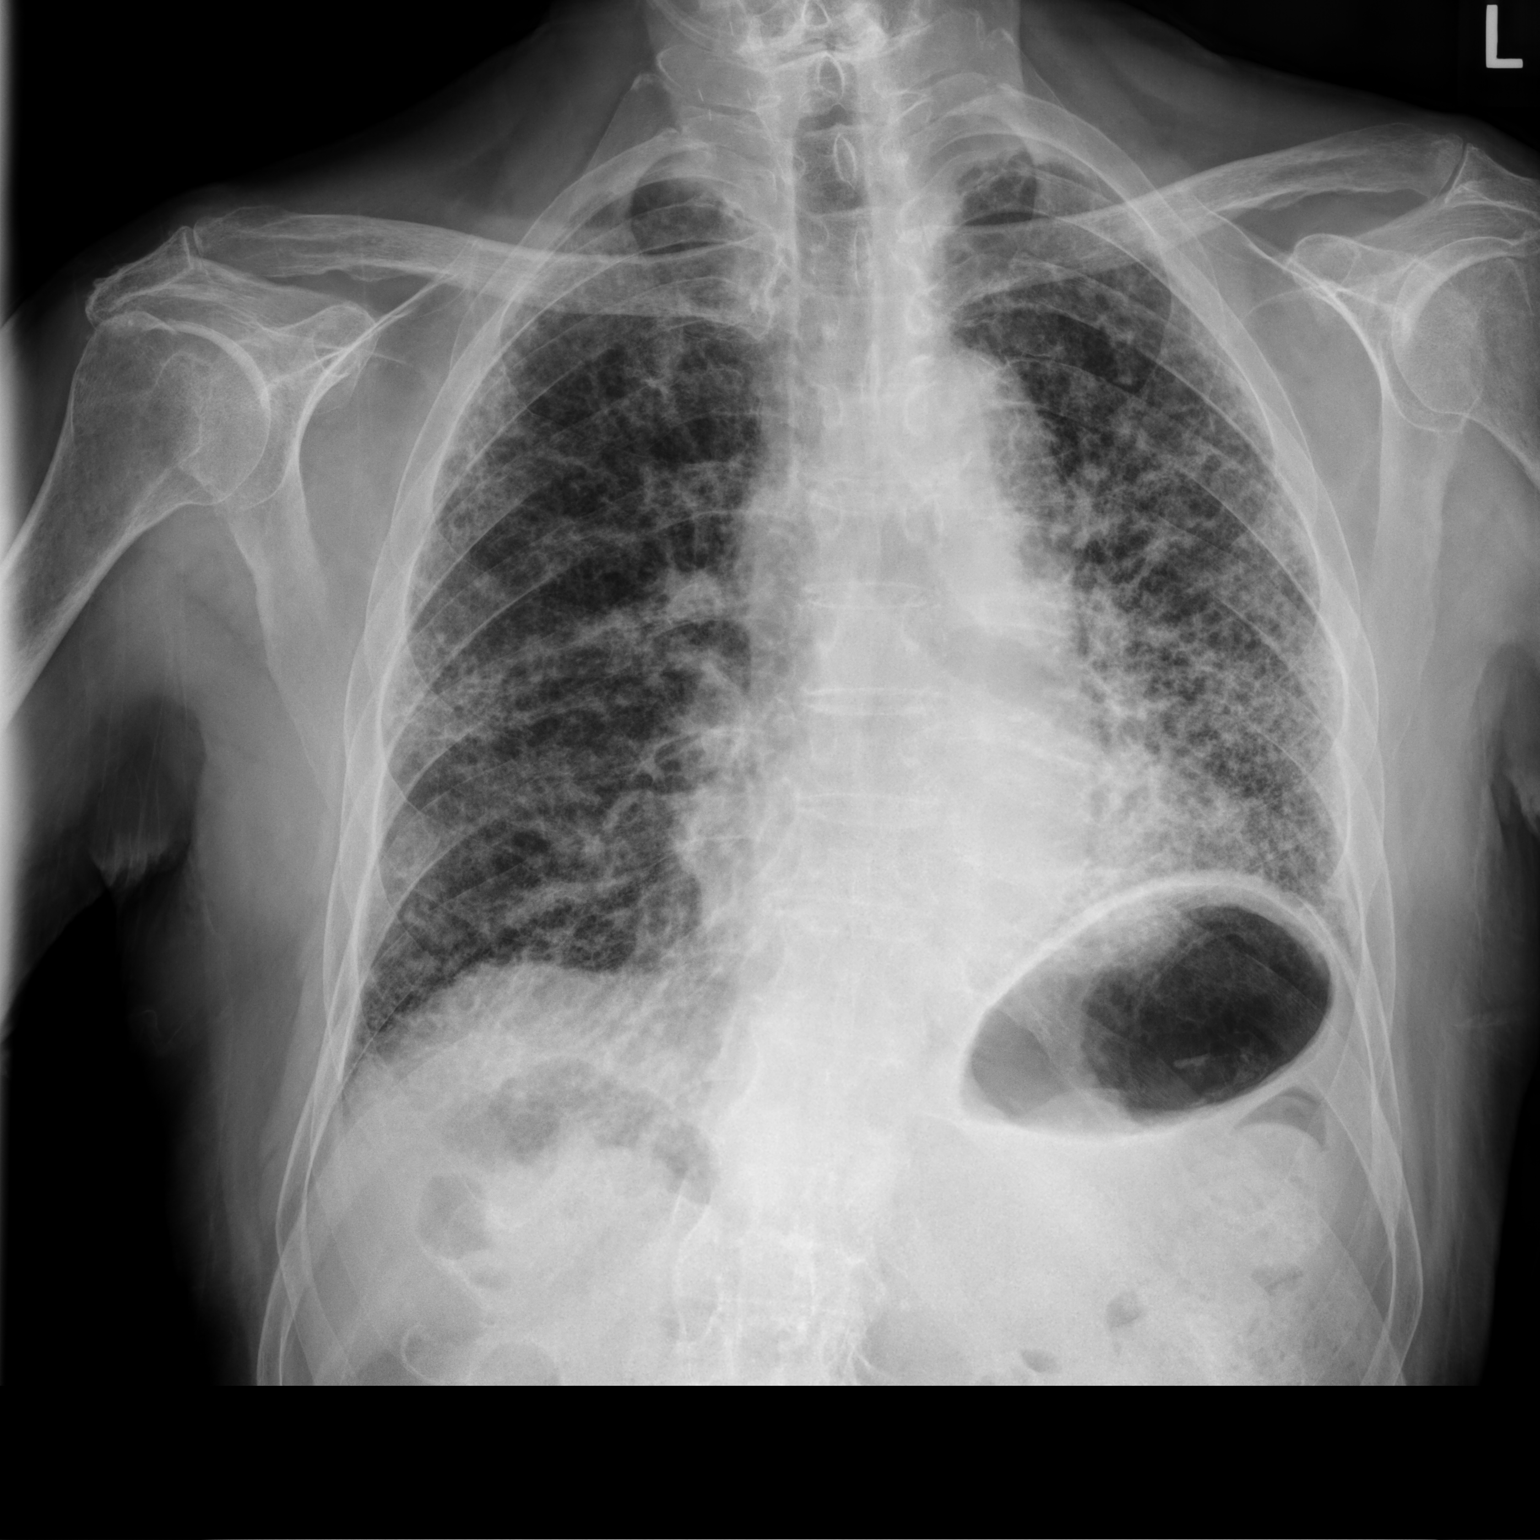

[chest lat]
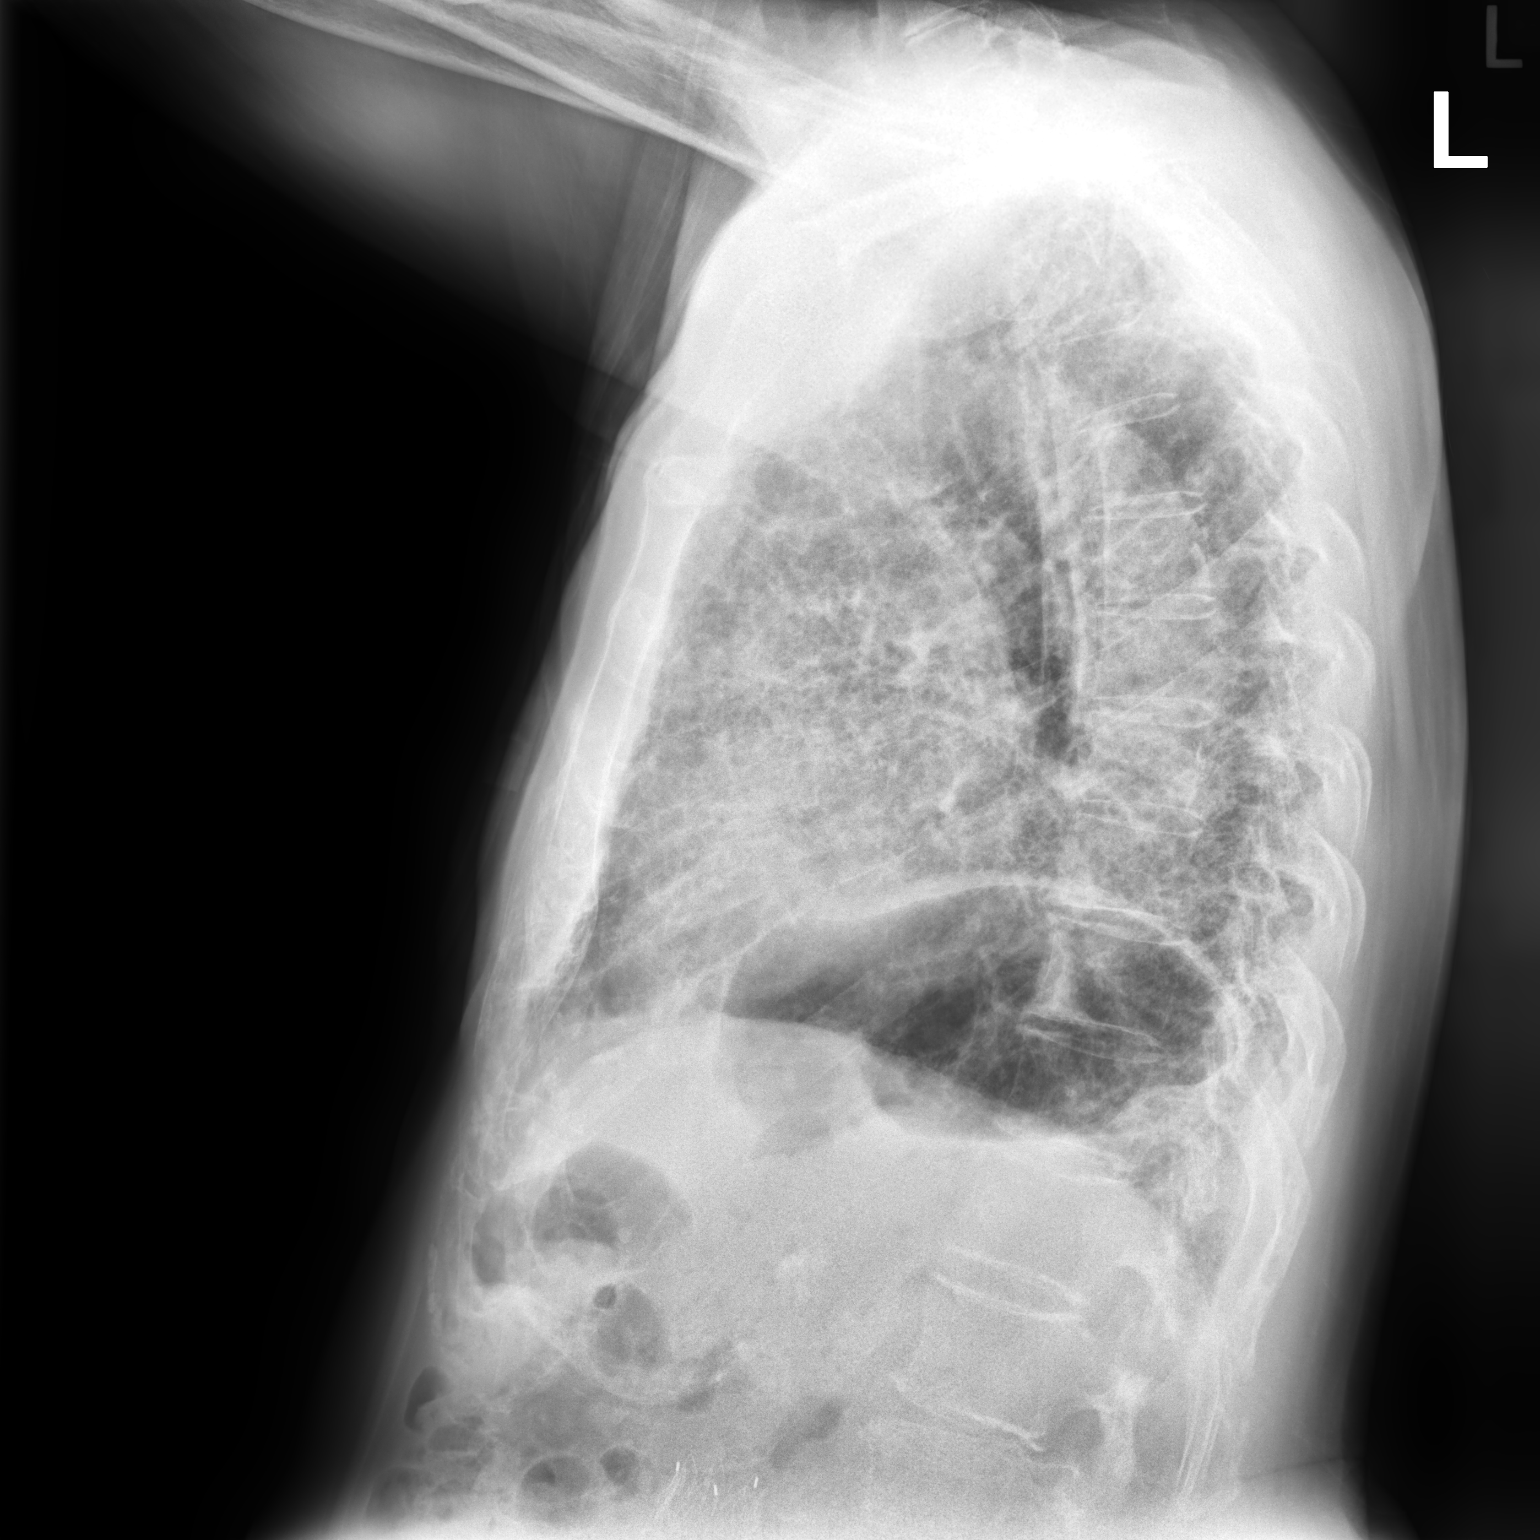

[2 of 2 positions shown; findings below may reference images not displayed]

FINDINGS: The cardiac silhouette, mediastinal and hilar contours are within
normal limits and stable.

Severe chronic lung disease with advanced changes of pulmonary
fibrosis with honeycombing.

Superimposed infiltrates suspected on prior chest CT appears
resolved. No pleural effusions or worrisome pulmonary lesions.
IMPRESSION: 1. Severe chronic lung disease with pulmonary fibrosis and
honeycombing.
2. Improved left lung aeration with probable resolution of
superimposed left pulmonary infiltrate seen on prior study.

## 2020-03-08 IMAGING — DX DG CHEST 1V PORT
1 series · 1 of 1 positions shown · non-contrast
Comparison: January 13, 2019

CLINICAL DATA: Syncope

EXAM:
PORTABLE CHEST 1 VIEW

[chest ap]
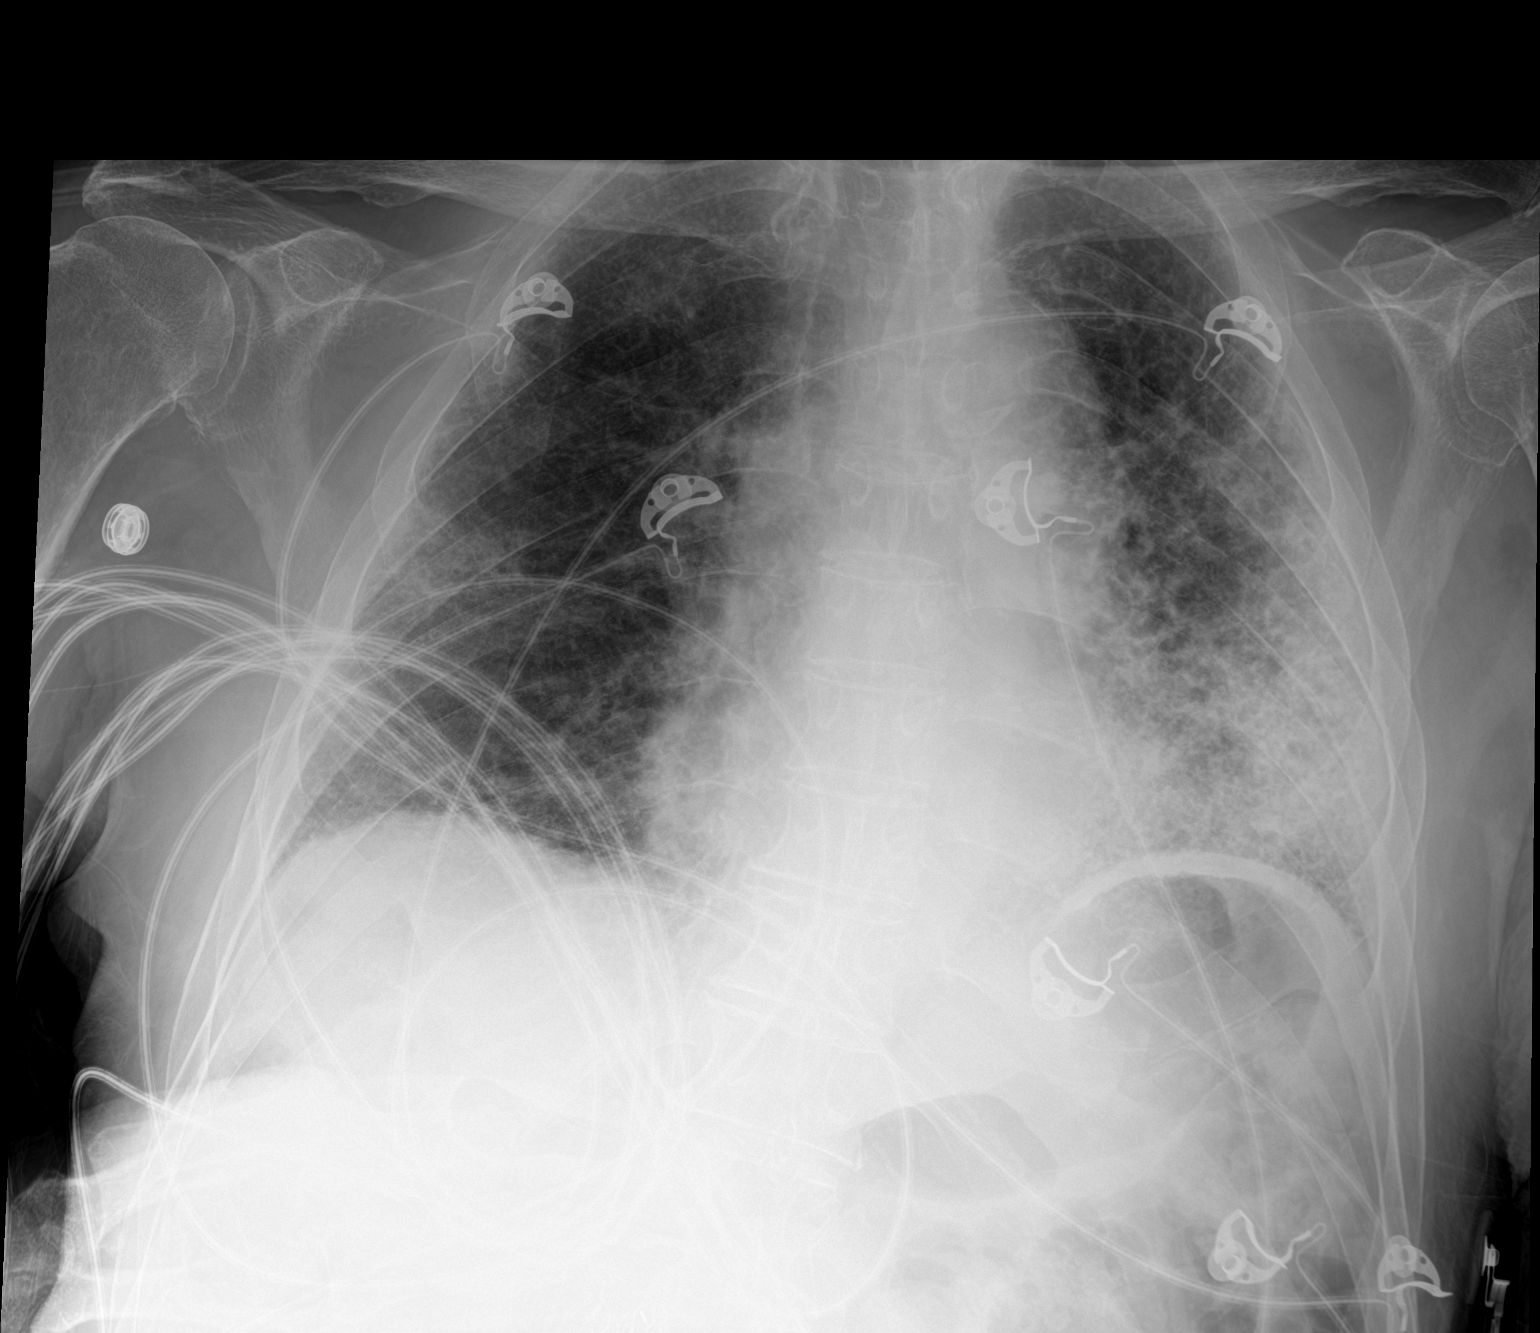

[1 of 1 positions shown; findings below may reference images not displayed]

FINDINGS: There is a persistent airspace opacity involving much of the left
mid and left lower lung zone. There are scattered airspace opacities
throughout the right mid and right lower lung zones. The heart size
remains enlarged. There is no pneumothorax. No large pleural
effusion.
IMPRESSION: 1. Persistent findings of interstitial lung disease with relatively
unchanged consolidation involving much of the left lung field.
2. Unchanged cardiomegaly.
